# Patient Record
Sex: Female | Born: 1935 | Hispanic: No | Marital: Married | Smoking: Never smoker
Health system: Southern US, Community
[De-identification: ages and names within clinical notes are randomized; demographics above are authoritative.]

## PROBLEM LIST (undated history)

## (undated) DIAGNOSIS — L409 Psoriasis, unspecified: Secondary | ICD-10-CM

## (undated) DIAGNOSIS — I639 Cerebral infarction, unspecified: Secondary | ICD-10-CM

## (undated) DIAGNOSIS — H269 Unspecified cataract: Secondary | ICD-10-CM

## (undated) DIAGNOSIS — H9190 Unspecified hearing loss, unspecified ear: Secondary | ICD-10-CM

## (undated) DIAGNOSIS — Z7901 Long term (current) use of anticoagulants: Secondary | ICD-10-CM

## (undated) DIAGNOSIS — I4892 Unspecified atrial flutter: Secondary | ICD-10-CM

## (undated) DIAGNOSIS — Z87898 Personal history of other specified conditions: Secondary | ICD-10-CM

## (undated) DIAGNOSIS — I209 Angina pectoris, unspecified: Secondary | ICD-10-CM

## (undated) DIAGNOSIS — I519 Heart disease, unspecified: Secondary | ICD-10-CM

## (undated) DIAGNOSIS — M1990 Unspecified osteoarthritis, unspecified site: Secondary | ICD-10-CM

## (undated) DIAGNOSIS — E7800 Pure hypercholesterolemia, unspecified: Secondary | ICD-10-CM

## (undated) DIAGNOSIS — R51 Headache: Secondary | ICD-10-CM

## (undated) DIAGNOSIS — E119 Type 2 diabetes mellitus without complications: Secondary | ICD-10-CM

## (undated) DIAGNOSIS — R200 Anesthesia of skin: Secondary | ICD-10-CM

## (undated) DIAGNOSIS — R42 Dizziness and giddiness: Secondary | ICD-10-CM

## (undated) DIAGNOSIS — M810 Age-related osteoporosis without current pathological fracture: Secondary | ICD-10-CM

## (undated) DIAGNOSIS — R682 Dry mouth, unspecified: Secondary | ICD-10-CM

## (undated) DIAGNOSIS — I10 Essential (primary) hypertension: Secondary | ICD-10-CM

## (undated) DIAGNOSIS — K759 Inflammatory liver disease, unspecified: Secondary | ICD-10-CM

## (undated) DIAGNOSIS — B269 Mumps without complication: Secondary | ICD-10-CM

## (undated) DIAGNOSIS — B059 Measles without complication: Secondary | ICD-10-CM

## (undated) DIAGNOSIS — I2510 Atherosclerotic heart disease of native coronary artery without angina pectoris: Secondary | ICD-10-CM

## (undated) DIAGNOSIS — R202 Paresthesia of skin: Secondary | ICD-10-CM

## (undated) DIAGNOSIS — L4050 Arthropathic psoriasis, unspecified: Secondary | ICD-10-CM

## (undated) HISTORY — DX: Dizziness and giddiness: R42

## (undated) HISTORY — DX: Unspecified atrial flutter: I48.92

## (undated) HISTORY — DX: Essential (primary) hypertension: I10

## (undated) HISTORY — PX: APPENDECTOMY: SHX54

## (undated) HISTORY — PX: TONSILLECTOMY: SUR1361

## (undated) HISTORY — PX: CARDIAC CATHETERIZATION: SHX172

## (undated) HISTORY — DX: Pure hypercholesterolemia, unspecified: E78.00

## (undated) HISTORY — DX: Long term (current) use of anticoagulants: Z79.01

## (undated) HISTORY — DX: Heart disease, unspecified: I51.9

## (undated) HISTORY — DX: Atherosclerotic heart disease of native coronary artery without angina pectoris: I25.10

---

## 1998-12-08 ENCOUNTER — Other Ambulatory Visit: Admission: RE | Admit: 1998-12-08 | Discharge: 1998-12-08 | Payer: Self-pay | Admitting: Obstetrics and Gynecology

## 1999-10-03 ENCOUNTER — Other Ambulatory Visit: Admission: RE | Admit: 1999-10-03 | Discharge: 1999-10-03 | Payer: Self-pay | Admitting: Obstetrics and Gynecology

## 2000-06-18 ENCOUNTER — Encounter: Payer: Self-pay | Admitting: Geriatric Medicine

## 2000-06-18 ENCOUNTER — Ambulatory Visit (HOSPITAL_COMMUNITY): Admission: RE | Admit: 2000-06-18 | Discharge: 2000-06-18 | Payer: Self-pay | Admitting: Geriatric Medicine

## 2000-07-16 ENCOUNTER — Encounter: Payer: Self-pay | Admitting: Internal Medicine

## 2000-07-16 ENCOUNTER — Ambulatory Visit (HOSPITAL_COMMUNITY): Admission: RE | Admit: 2000-07-16 | Discharge: 2000-07-16 | Payer: Self-pay | Admitting: Internal Medicine

## 2000-10-29 ENCOUNTER — Other Ambulatory Visit: Admission: RE | Admit: 2000-10-29 | Discharge: 2000-10-29 | Payer: Self-pay | Admitting: Vascular Surgery

## 2000-10-30 ENCOUNTER — Other Ambulatory Visit: Admission: RE | Admit: 2000-10-30 | Discharge: 2000-10-30 | Payer: Self-pay | Admitting: Obstetrics and Gynecology

## 2000-10-30 ENCOUNTER — Encounter (INDEPENDENT_AMBULATORY_CARE_PROVIDER_SITE_OTHER): Payer: Self-pay

## 2001-10-27 ENCOUNTER — Other Ambulatory Visit: Admission: RE | Admit: 2001-10-27 | Discharge: 2001-10-27 | Payer: Self-pay | Admitting: Obstetrics and Gynecology

## 2002-01-08 ENCOUNTER — Ambulatory Visit (HOSPITAL_COMMUNITY): Admission: RE | Admit: 2002-01-08 | Discharge: 2002-01-08 | Payer: Self-pay | Admitting: Specialist

## 2002-02-01 ENCOUNTER — Ambulatory Visit (HOSPITAL_COMMUNITY): Admission: RE | Admit: 2002-02-01 | Discharge: 2002-02-01 | Payer: Self-pay | Admitting: Specialist

## 2002-11-15 ENCOUNTER — Other Ambulatory Visit: Admission: RE | Admit: 2002-11-15 | Discharge: 2002-11-15 | Payer: Self-pay | Admitting: Obstetrics and Gynecology

## 2003-11-15 ENCOUNTER — Other Ambulatory Visit: Admission: RE | Admit: 2003-11-15 | Discharge: 2003-11-15 | Payer: Self-pay | Admitting: Obstetrics and Gynecology

## 2004-06-27 ENCOUNTER — Encounter: Admission: RE | Admit: 2004-06-27 | Discharge: 2004-06-27 | Payer: Self-pay | Admitting: Neurosurgery

## 2004-09-29 HISTORY — PX: LUMBAR DISC SURGERY: SHX700

## 2004-10-05 ENCOUNTER — Inpatient Hospital Stay (HOSPITAL_COMMUNITY): Admission: RE | Admit: 2004-10-05 | Discharge: 2004-10-07 | Payer: Self-pay | Admitting: Neurosurgery

## 2005-02-14 ENCOUNTER — Inpatient Hospital Stay (HOSPITAL_COMMUNITY): Admission: EM | Admit: 2005-02-14 | Discharge: 2005-02-22 | Payer: Self-pay | Admitting: *Deleted

## 2005-02-16 HISTORY — PX: CORONARY ARTERY BYPASS GRAFT: SHX141

## 2005-03-25 ENCOUNTER — Encounter (HOSPITAL_COMMUNITY): Admission: RE | Admit: 2005-03-25 | Discharge: 2005-06-23 | Payer: Self-pay | Admitting: Interventional Cardiology

## 2005-06-25 ENCOUNTER — Encounter (HOSPITAL_COMMUNITY): Admission: RE | Admit: 2005-06-25 | Discharge: 2005-09-23 | Payer: Self-pay | Admitting: Interventional Cardiology

## 2005-09-29 ENCOUNTER — Encounter (HOSPITAL_COMMUNITY): Admission: RE | Admit: 2005-09-29 | Discharge: 2005-12-28 | Payer: Self-pay | Admitting: Interventional Cardiology

## 2005-12-31 ENCOUNTER — Encounter (HOSPITAL_COMMUNITY): Admission: RE | Admit: 2005-12-31 | Discharge: 2006-03-31 | Payer: Self-pay | Admitting: Interventional Cardiology

## 2006-01-21 ENCOUNTER — Encounter: Admission: RE | Admit: 2006-01-21 | Discharge: 2006-01-21 | Payer: Self-pay | Admitting: Obstetrics and Gynecology

## 2006-03-24 ENCOUNTER — Observation Stay (HOSPITAL_COMMUNITY): Admission: EM | Admit: 2006-03-24 | Discharge: 2006-03-25 | Payer: Self-pay | Admitting: Emergency Medicine

## 2006-04-01 ENCOUNTER — Encounter (HOSPITAL_COMMUNITY): Admission: RE | Admit: 2006-04-01 | Discharge: 2006-06-30 | Payer: Self-pay | Admitting: Interventional Cardiology

## 2006-04-21 ENCOUNTER — Encounter: Admission: RE | Admit: 2006-04-21 | Discharge: 2006-04-21 | Payer: Self-pay | Admitting: Rheumatology

## 2006-07-01 ENCOUNTER — Encounter (HOSPITAL_COMMUNITY): Admission: RE | Admit: 2006-07-01 | Discharge: 2006-09-29 | Payer: Self-pay | Admitting: Interventional Cardiology

## 2006-09-30 ENCOUNTER — Encounter (HOSPITAL_COMMUNITY): Admission: RE | Admit: 2006-09-30 | Discharge: 2006-12-29 | Payer: Self-pay | Admitting: Interventional Cardiology

## 2006-10-07 ENCOUNTER — Encounter: Admission: RE | Admit: 2006-10-07 | Discharge: 2006-10-07 | Payer: Self-pay | Admitting: Obstetrics and Gynecology

## 2006-12-30 ENCOUNTER — Encounter (HOSPITAL_COMMUNITY): Admission: RE | Admit: 2006-12-30 | Discharge: 2007-03-30 | Payer: Self-pay | Admitting: Interventional Cardiology

## 2007-03-31 ENCOUNTER — Encounter (HOSPITAL_COMMUNITY): Admission: RE | Admit: 2007-03-31 | Discharge: 2007-06-29 | Payer: Self-pay | Admitting: Interventional Cardiology

## 2007-06-30 ENCOUNTER — Encounter (HOSPITAL_COMMUNITY): Admission: RE | Admit: 2007-06-30 | Discharge: 2007-09-28 | Payer: Self-pay | Admitting: Interventional Cardiology

## 2007-07-08 ENCOUNTER — Encounter: Admission: RE | Admit: 2007-07-08 | Discharge: 2007-07-08 | Payer: Self-pay | Admitting: Rheumatology

## 2007-09-30 ENCOUNTER — Encounter (HOSPITAL_COMMUNITY): Admission: RE | Admit: 2007-09-30 | Discharge: 2007-12-29 | Payer: Self-pay | Admitting: Interventional Cardiology

## 2007-10-14 ENCOUNTER — Encounter: Admission: RE | Admit: 2007-10-14 | Discharge: 2007-10-14 | Payer: Self-pay | Admitting: Rheumatology

## 2007-12-31 ENCOUNTER — Encounter (HOSPITAL_COMMUNITY): Admission: RE | Admit: 2007-12-31 | Discharge: 2008-03-01 | Payer: Self-pay | Admitting: Interventional Cardiology

## 2008-10-18 ENCOUNTER — Encounter: Admission: RE | Admit: 2008-10-18 | Discharge: 2008-10-18 | Payer: Self-pay | Admitting: Rheumatology

## 2009-07-12 ENCOUNTER — Encounter: Admission: RE | Admit: 2009-07-12 | Discharge: 2009-07-12 | Payer: Self-pay | Admitting: Neurology

## 2009-10-19 ENCOUNTER — Encounter: Admission: RE | Admit: 2009-10-19 | Discharge: 2009-10-19 | Payer: Self-pay | Admitting: Obstetrics and Gynecology

## 2009-12-04 ENCOUNTER — Encounter: Admission: RE | Admit: 2009-12-04 | Discharge: 2009-12-04 | Payer: Self-pay | Admitting: Internal Medicine

## 2010-10-23 ENCOUNTER — Encounter: Admission: RE | Admit: 2010-10-23 | Discharge: 2010-10-23 | Payer: Self-pay | Admitting: Geriatric Medicine

## 2010-12-30 DIAGNOSIS — I639 Cerebral infarction, unspecified: Secondary | ICD-10-CM

## 2010-12-30 HISTORY — DX: Cerebral infarction, unspecified: I63.9

## 2011-02-04 ENCOUNTER — Inpatient Hospital Stay (HOSPITAL_COMMUNITY)
Admission: EM | Admit: 2011-02-04 | Discharge: 2011-02-08 | DRG: 065 | Disposition: A | Discharge status: Home Health | Payer: Medicare Other | Attending: Neurology | Admitting: Neurology

## 2011-02-04 ENCOUNTER — Emergency Department (HOSPITAL_COMMUNITY): Payer: Medicare Other

## 2011-02-04 DIAGNOSIS — Z79899 Other long term (current) drug therapy: Secondary | ICD-10-CM

## 2011-02-04 DIAGNOSIS — M4000 Postural kyphosis, site unspecified: Secondary | ICD-10-CM | POA: Diagnosis present

## 2011-02-04 DIAGNOSIS — E119 Type 2 diabetes mellitus without complications: Secondary | ICD-10-CM | POA: Diagnosis present

## 2011-02-04 DIAGNOSIS — I498 Other specified cardiac arrhythmias: Secondary | ICD-10-CM | POA: Diagnosis present

## 2011-02-04 DIAGNOSIS — I2510 Atherosclerotic heart disease of native coronary artery without angina pectoris: Secondary | ICD-10-CM | POA: Diagnosis present

## 2011-02-04 DIAGNOSIS — Q211 Atrial septal defect: Secondary | ICD-10-CM

## 2011-02-04 DIAGNOSIS — Z794 Long term (current) use of insulin: Secondary | ICD-10-CM

## 2011-02-04 DIAGNOSIS — I10 Essential (primary) hypertension: Secondary | ICD-10-CM | POA: Diagnosis present

## 2011-02-04 DIAGNOSIS — I6340 Cerebral infarction due to embolism of unspecified cerebral artery: Principal | ICD-10-CM | POA: Diagnosis present

## 2011-02-04 DIAGNOSIS — R29898 Other symptoms and signs involving the musculoskeletal system: Secondary | ICD-10-CM | POA: Diagnosis present

## 2011-02-04 DIAGNOSIS — Q2111 Secundum atrial septal defect: Secondary | ICD-10-CM

## 2011-02-04 DIAGNOSIS — Z8249 Family history of ischemic heart disease and other diseases of the circulatory system: Secondary | ICD-10-CM

## 2011-02-04 DIAGNOSIS — M1710 Unilateral primary osteoarthritis, unspecified knee: Secondary | ICD-10-CM | POA: Diagnosis present

## 2011-02-04 DIAGNOSIS — E785 Hyperlipidemia, unspecified: Secondary | ICD-10-CM | POA: Diagnosis present

## 2011-02-04 DIAGNOSIS — Z880 Allergy status to penicillin: Secondary | ICD-10-CM

## 2011-02-04 DIAGNOSIS — Z951 Presence of aortocoronary bypass graft: Secondary | ICD-10-CM

## 2011-02-04 DIAGNOSIS — R471 Dysarthria and anarthria: Secondary | ICD-10-CM | POA: Diagnosis present

## 2011-02-04 DIAGNOSIS — L4050 Arthropathic psoriasis, unspecified: Secondary | ICD-10-CM | POA: Diagnosis present

## 2011-02-04 DIAGNOSIS — I4892 Unspecified atrial flutter: Secondary | ICD-10-CM | POA: Diagnosis present

## 2011-02-04 DIAGNOSIS — Z833 Family history of diabetes mellitus: Secondary | ICD-10-CM

## 2011-02-04 LAB — TROPONIN I
Troponin I: 0.01 ng/mL (ref 0.00–0.06)
Troponin I: <0.01 ng/mL (ref 0.00–0.06)

## 2011-02-04 LAB — CBC
HCT: 39.3 % (ref 36.0–46.0)
Hemoglobin: 13.4 g/dL (ref 12.0–15.0)
Hemoglobin: 12.9 g/dL (ref 12.0–15.0)
MCHC: 34.4 g/dL (ref 30.0–36.0)
MCV: 86.8 fL (ref 78.0–100.0)
RBC: 4.32 MIL/uL (ref 3.87–5.11)
WBC: 8.2 10*3/uL (ref 4.0–10.5)

## 2011-02-04 LAB — COMPREHENSIVE METABOLIC PANEL
ALT: 27 U/L (ref 0–35)
Alkaline Phosphatase: 64 U/L (ref 39–117)
BUN: 20 mg/dL (ref 6–23)
Calcium: 9.6 mg/dL (ref 8.4–10.5)
Calcium: 9.3 mg/dL (ref 8.4–10.5)
Creatinine, Ser: 0.89 mg/dL (ref 0.4–1.2)
Creatinine, Ser: 0.77 mg/dL (ref 0.4–1.2)
GFR calc Af Amer: >60 mL/min (ref >60)
Glucose, Bld: 213 mg/dL — ABNORMAL HIGH (ref 70–99)
Glucose, Bld: 211 mg/dL — ABNORMAL HIGH (ref 70–99)
Potassium: 4.4 mEq/L (ref 3.5–5.1)
Sodium: 139 mEq/L (ref 135–145)
Total Protein: 6.6 g/dL (ref 6.0–8.3)
Total Protein: 6.5 g/dL (ref 6.0–8.3)

## 2011-02-04 LAB — DIFFERENTIAL
Basophils Absolute: 0 10*3/uL (ref 0.0–0.1)
Basophils Absolute: 0 10*3/uL (ref 0.0–0.1)
Basophils Relative: 0 % (ref 0–1)
Lymphocytes Relative: 44 % (ref 12–46)
Lymphs Abs: 3.6 10*3/uL (ref 0.7–4.0)
Neutro Abs: 3.4 10*3/uL (ref 1.7–7.7)
Neutro Abs: 4.8 10*3/uL (ref 1.7–7.7)
Neutrophils Relative %: 67 % (ref 43–77)

## 2011-02-04 LAB — CK TOTAL AND CKMB (NOT AT ARMC)
CK, MB: 1.8 ng/mL (ref 0.3–4.0)
CK, MB: 1.6 ng/mL (ref 0.3–4.0)
Relative Index: INVALID (ref 0.0–2.5)
Relative Index: INVALID (ref 0.0–2.5)
Total CK: 74 U/L (ref 7–177)

## 2011-02-04 LAB — PROTIME-INR
INR: 0.89 (ref 0.00–1.49)
INR: 0.92 (ref 0.00–1.49)
Prothrombin Time: 12.3 seconds (ref 11.6–15.2)
Prothrombin Time: 12.6 seconds (ref 11.6–15.2)

## 2011-02-04 LAB — APTT: aPTT: 24 seconds (ref 24–37)

## 2011-02-05 LAB — HEMOGLOBIN A1C
Hgb A1c MFr Bld: 7.8 % — ABNORMAL HIGH (ref <5.7)
Mean Plasma Glucose: 177 mg/dL — ABNORMAL HIGH (ref <117)

## 2011-02-05 LAB — GLUCOSE, CAPILLARY
Glucose-Capillary: 173 mg/dL — ABNORMAL HIGH (ref 70–99)
Glucose-Capillary: 183 mg/dL — ABNORMAL HIGH (ref 70–99)
Glucose-Capillary: 222 mg/dL — ABNORMAL HIGH (ref 70–99)

## 2011-02-05 LAB — URINALYSIS, ROUTINE W REFLEX MICROSCOPIC
Bilirubin Urine: NEGATIVE
Hgb urine dipstick: NEGATIVE
Ketones, ur: 15 mg/dL — AB
Nitrite: NEGATIVE
Urine Glucose, Fasting: 250 mg/dL — AB
pH: 5.5 (ref 5.0–8.0)

## 2011-02-05 LAB — MRSA PCR SCREENING: MRSA by PCR: NEGATIVE

## 2011-02-05 LAB — URINE MICROSCOPIC-ADD ON

## 2011-02-05 LAB — LIPID PANEL
Cholesterol: 117 mg/dL (ref 0–200)
LDL Cholesterol: 49 mg/dL (ref 0–99)
Triglycerides: 99 mg/dL (ref <150)

## 2011-02-06 ENCOUNTER — Inpatient Hospital Stay (HOSPITAL_COMMUNITY): Payer: Medicare Other

## 2011-02-06 LAB — GLUCOSE, CAPILLARY: Glucose-Capillary: 228 mg/dL — ABNORMAL HIGH (ref 70–99)

## 2011-02-06 MED ORDER — IOHEXOL 350 MG/ML SOLN
100.0000 mL | Freq: Once | INTRAVENOUS | Status: AC | PRN
  Administered 2011-02-06: 100 mL via INTRAVENOUS

## 2011-02-07 DIAGNOSIS — I6350 Cerebral infarction due to unspecified occlusion or stenosis of unspecified cerebral artery: Secondary | ICD-10-CM

## 2011-02-07 LAB — GLUCOSE, CAPILLARY
Glucose-Capillary: 163 mg/dL — ABNORMAL HIGH (ref 70–99)
Glucose-Capillary: 179 mg/dL — ABNORMAL HIGH (ref 70–99)
Glucose-Capillary: 117 mg/dL — ABNORMAL HIGH (ref 70–99)
Glucose-Capillary: 146 mg/dL — ABNORMAL HIGH (ref 70–99)

## 2011-02-08 LAB — GLUCOSE, CAPILLARY
Glucose-Capillary: 91 mg/dL (ref 70–99)
Glucose-Capillary: 168 mg/dL — ABNORMAL HIGH (ref 70–99)

## 2011-02-12 NOTE — Consult Note (Signed)
[name], [name]               ACCOUNT NO.:  [id number]  MEDICAL RECORD NO.:  [id number]           PATIENT TYPE:  I  LOCATION:  3018                         FACILITY:  [facility]  PHYSICIAN:  Armanda Magic, M.D.     DATE OF BIRTH:  01-19-1934  DATE OF CONSULTATION:  02/08/2011 DATE OF DISCHARGE:  02/08/2011                                CONSULTATION   REFERRING PHYSICIAN:  Pramod P. Pearlean Brownie, MD  CHIEF COMPLAINT:  Palpitations with arrhythmia.  HISTORY OF PRESENT ILLNESS:  This is a 77 year old white female with a history of CAD, hypertension, diabetes mellitus who presented to the emergency room with complaints of slurred speech and difficulty walking. She was diagnosed with a brain stem CVA and underwent 2D echocardiogram showing normal LV function.  She was about to be discharged home when she complained of palpitations.  Apparently, she has a history of some palpitations in the past.  Rhythm strip showed SVT at 160 beats per minute, which was felt to be atrial tachycardia versus atrial flutter with 2:1 block.  She has had some other arrhythmias during her hospital stay as well that have been somewhat more irregular and probably more consistent with atrial fibrillation.  We are now asked to consult.  PAST MEDICAL HISTORY:  CAD, status post CABG, hypertension, diabetes mellitus, psoriatic arthritis, dyslipidemia, history of hepatitis A.  ALLERGIES:  PENICILLIN.  FAMILY HISTORY:  Significant for CAD and diabetes mellitus.  SOCIAL HISTORY:  She lives with her husband at home.  She denies any tobacco or alcohol use.  No IV drug use.  MEDICATIONS ON ADMISSION: 1. Glucovance 2.5/500 mg 2 tablets t.i.d. 2. Ramipril 5 mg b.i.d. 3. Coreg 12.5 mg b.i.d. 4. Aspirin 325 mg a day. 5. Citracal plus vitamin D. 6. Multivitamin. 7. Magnesium oxide 200 mg daily. 8. Lipitor 80 mg at night. 9. Lantus 30 units subcu every night. 10.Enbrel 50 mg once a week.  REVIEW OF SYSTEMS:  Other  than what is stated in HPI is negative.  PHYSICAL EXAMINATION:  VITAL SIGNS:  Blood pressure is 143/76, heart rate 75. GENERAL:  This is a well-developed, well-nourished white female, in no acute distress. HEENT:  Benign. NECK:  Supple without lymphadenopathy.  No bruits. LUNGS:  Clear to auscultation throughout. HEART:  Regular rate and rhythm.  No murmurs, rubs, or gallops.  Normal S1 and S2. ABDOMEN:  Soft, nontender, nondistended with active bowel sounds.  No hepatosplenomegaly. EXTREMITIES:  No cyanosis, erythema, or edema.  LABORATORY DATA:  Sodium 139, potassium 4.5, chloride 104, bicarb 26, BUN 19, creatinine 0.11.  White cell count 7.1, hemoglobin 12.9, hematocrit 37.5, platelet count 157.  Cardiac enzymes negative x1.  CT of the neck showed no focal stenosis of the carotids.  2D echocardiogram showed normal LV function with no obvious source of embolus.  Her 12- lead EKG showed normal sinus rhythm with no ST changes.  There were several rhythm strips showing SVT with aberration.  A few of the runs appeared somewhat irregular, worrisome for atrial fibrillation.  She did not have several other episodes today, which were SVT about 160 beats per minute,  worrisome for possible atrial flutter.  Cannot rule out atrial flutter at this time based on the rhythm strips.  ASSESSMENT: 1. Acute brain stem cerebrovascular accident, still cardioembolic, but     no obvious source until the patient had the above-documented     arrhythmias. 2. Brief runs of supraventricular tachycardia especially with     palpitations also several episodes of wide complex tachycardia that     appear irregular and most likely her atrial fibrillation with     aberrancy.  The patient states she has had palpitations off and on.     Cannot rule out atrial flutter with 2:1 block as well as some     intermittent episodes of paroxysmal atrial fibrillation with     aberration.  This could represent a source of her  cerebrovascular     accident.  PLAN:  Given her [place] score of 4 and documented arrhythmias with possibility that this is atrial flutter and atrial fibrillation, I would recommend systemic anticoagulation.  We will increase Coreg to 25 mg p.o. b.i.d.  I have ordered an outpatient Life[employer] monitor through our office, and she will follow up with Dr. Katrinka Blazing on February 13, 2011.  She already has an appointment scheduled.     Armanda Magic, M.D.     TT/MEDQ  D:  02/08/2011  T:  02/09/2011  Job:  [id number]  cc:   Lyn Records, M.D.  Electronically Signed by Armanda Magic M.D. on 02/12/2011 12:08:51 PM

## 2011-02-28 NOTE — Discharge Summary (Signed)
NAMEKARMON, FOLK               ACCOUNT NO.:  [id number]  MEDICAL RECORD NO.:  [id number]           PATIENT TYPE:  I  LOCATION:  3018                         FACILITY:  [facility]  PHYSICIAN:  Pramod P. Pearlean Brownie, MD    DATE OF BIRTH:  1934/04/21  DATE OF ADMISSION:  02/04/2011 DATE OF DISCHARGE:  02/08/2011                              DISCHARGE SUMMARY   DIAGNOSES AT TIME OF DISCHARGE: 1. Left cerebellar infarct, felt to be embolic, though no     source confirmed. 2. Supraventricular tachycardia 3. Questionable atrial flutter. 4. Diabetes. 5. Hypertension. 6. Psoriatic arthritis. 7. Coronary artery disease status post coronary artery bypass graft. 8. Hyperlipidemia. 9. History of hepatitis A. 10. Patent foramen ovale.  MEDICINES AT TIME OF DISCHARGE: 1. Coreg 25 mg b.i.d. with meals. 2. Pradaxa 150 mg b.i.d. 3. Altace 5 mg b.i.d. 4. Citracal Plus D 1 a day. 5. Clonazepam 0.5 mg daily p.r.n. anxiety. 6. Enbrel 50 mg injection subcutaneous weekly on Thursdays. 7. Flexeril 10 mg daily p.r.n. for muscle relaxation. 8. Glucovance 2.5/500 2 tablets b.i.d. 9. Lantus 30 units nightly. 10.Lipitor 80 mg a day. 11.Multivitamin one a day. 12.Nitroglycerin sublingual 0.4 mg every 5 minutes, needs up to 3     doses for chest pain. 13.Vitamin D3 1000 units 2 tablets a day.  STUDIES PERFORMED: 1. CT of the brain on admission shows periventricular and corona     radiata white matter hypodensities compatible with chronic ischemic     microvascular white matter disease, otherwise, no acute     abnormality. 2. MRI of the brain shows a 1 x 2 cm acute infarct affecting the     cerebellum on the left.  Chronic small vessel changes elsewhere     throughout the brain. 3. MRA of the brain shows no major vessel occlusion or correctable     proximal stenosis.  Both superior cerebellar artery show flow.     Anterior-inferior cerebellar artery is identified only on the     right.  There is fetal  origin at the posterior cerebral artery on     the left from the anterior circulation.  It is unable to be     determined that there is occlusion at the left [employer], which is     anterior inferior cerebellar artery. 4. CT angio of the neck shows no focal stenosis.  Irregular beating of     cervical right ICA.  Tortuosity of internal carotid arteries     bilaterally and proximal left vertebral artery.  Atherosclerotic     calcifications at that aortic arch. 5. 2-D echocardiogram shows EF of 60% to 65% with no obvious source of     embolus. 6. Bilateral lower extremity Doppler, negative for DVT. 7. Carotid Doppler shows no ICA stenosis. 8. Transesophageal echocardiogram does show an atrial septal aneurysm     with a positive bubble study at rest.  There is severe     atherosclerosis of the descending aorta and aortic arch.  There are     no left atrial appendage thrombus.  EF was 60%. 9. EKG shows sinus rhythm  and rare complex possibly supraventricular     consider anterior infarct, borderline repolarization abnormality.  LABORATORY STUDIES:  Hemoglobin A1c 7.8.  Cholesterol 117, triglycerides 99, HDL 48, LDL 49.  Urinalysis, 0-2 red blood cells, 0-2 white blood cells, with rare bacteria.  MRSA screening negative.  Cardiac enzymes negative.  Chemistry with glucose 211, otherwise normal.  Liver function tests normal.  Coagulation studies normal.  CBC with diff normal.  HISTORY OF PRESENT ILLNESS:  Ms. [name] is a 77 year old right- handed Caucasian female with a history of hypertension, diabetes, coronary artery disease, who was in her usual state of health until 17:00 the day of admission.  She developed slurred speech and sensation of being pulled toward the left side and had difficulty walking.  She called her son, Dr. Mancel Bale from Oncology who came to see her.  He found her with severe slurred speech and gait difficulty.  She was brought to the emergency room for further  evaluation.  In the emergency room, she was found to have an NIH Stroke Scale of 2, one for decreased sensation in the left arm and one for dysarthria.  She was determined to not be a TPA candidate secondary to mild symptoms.  She was admitted to the Neuro ICU for further evaluation.  HOSPITAL COURSE:  MRI confirmed an acute left cerebellar infarct which appeared embolic in nature.  A complete workup was done including a TEE, which did show an atrial septal aneurysm and PFO, though this is not likely the etiology of her stroke.  On the day of discharge, the patient had a "flutter in her chest."  It was captured on telemetry.  Dr. Mayford Knife came and reviewed the strip.  She felt it was definitely SVT associated with palpitations and could not rule out atrial flutter with 2:1 block. Dr. Mayford Knife increased Coreg to 25 mg b.i.d.  Given her current [place] score and documented arrhythmia with possibility of a flutter, she recommended systemic anticoagulation.  Dr. Pearlean Brownie agreed with Pradaxa for anticoagulation with further arrhythmia workup to be followed up by Dr. Katrinka Blazing.  The patient will be sent with a cardiac monitor to wear at home to monitor heart rate looking for atrial fibrillation/atrial flutter.  The patient was evaluated by PT/OT and speech therapy.  She initially had ataxia but has improved significantly during hospitalization to the point where she can ambulate 200 feet with minimal guard assistance.  She will be discharged with a rolling walker with a seat for home use as well as home health PT and OT followup.  CONDITION AT TIME OF DISCHARGE:  The patient is alert and oriented x3. Speech clear.  No aphasia.  Visual fields full.  She was walking with a walker, though she has a slightly broad-based gait.  She has no focal extremity weakness.  DISCHARGE PLAN: 1. Discharge home with family. 2. Pradaxa for secondary stroke prevention given suspected atrial     flutter. 3. Followup Dr.  Katrinka Blazing in his office for further cardiac evaluation. 4. Outpatient cardiac monitor to be sent to the patient. 5. Followup Dr. Katrinka Blazing, February 15 at 9:45 a.m. 6. Followup Dr. Sandria Manly in 2 months. 7. Home Health PT and OT.     Annie Main, N.P.   ______________________________ Sunny Schlein. Pearlean Brownie, MD    SB/MEDQ  D:  02/08/2011  T:  02/09/2011  Job:  [id number]  cc:   Lyn Records, M.D. Genene Churn. Love, M.D. Demetria Pore. Coral Spikes, M.D. [facility]  Electronically  Signed by Annie Main N.P. on 02/18/2011 04:30:27 PM Electronically Signed by Delia Heady MD on 02/28/2011 01:17:42 PM

## 2011-03-04 NOTE — H&P (Signed)
[name], [name]               ACCOUNT NO.:  [id number]  MEDICAL RECORD NO.:  [id number]           PATIENT TYPE:  E  LOCATION:  [facility]                         FACILITY:  [facility]  PHYSICIAN:  Joycelyn Schmid, MD   DATE OF BIRTH:  1934-12-21  DATE OF ADMISSION:  02/04/2011 DATE OF DISCHARGE:                             HISTORY & PHYSICAL   CODE STROKE LOG:  Last seen normal 1700.  The patient arrival 36. Code Stroke was activated at 1825.  Initial ED physician examination [phone].  Stroke Team a lot arrival [phone].  Initial neurology exam 1837. NIH stroke scale 2.  Modified Rankin 0.  IV t-PA was not given due to rapidly improving since and mild symptoms.  HISTORY OF PRESENT ILLNESS:  A 77 year old female with hypertension, diabetes, coronary artery disease, psoriatic arthritis who was in normal state of health until 5 o'clock p.m. today.  She developed slurred speech and sensation of being pulled towards the left side.  She had extreme difficulty walking.  She called her son who is Dr. Mancel Bale from Oncology who came to see her.  He found her with severe slurred speech and gait difficulty.  He brought her to the emergency room for further evaluation.  PAST MEDICAL HISTORY: 1. Hypertension. 2. Diabetes. 3. Psoriatic arthritis. 4. Coronary artery disease status post CABG. 5. Hyperlipidemia. 6. History of hepatitis A.  MEDICATIONS: 1. Glucovance 2.5/500 two tablets twice a day. 2. Ramipril 5 mg twice a day. 3. Coreg 12.5 mg twice a day. 4. Aspirin 325 mg per day. 5. Citracal plus Vitamin D. 6. Multivitamin. 7. Magnesium oxide 200 mg daily. 8. Lipitor 80 mg at night. 9. Lantus 30 units subcu every night. 10.Enbrel 50 mg at once a week.  ALLERGIES:  PENICILLIN.  FAMILY HISTORY:  Diabetes, coronary artery disease.  SOCIAL HISTORY:  The patient lives at home with her husband.  Denies tobacco, alcohol, or illicit drug use.  REVIEW OF SYSTEMS:  As per the HPI.  No  recent chest pain, shortness of breath, abdominal pain.  She has psoriatic arthritis.  She did have nausea and vomiting with this episode of acute stroke.  PHYSICAL EXAMINATION:  VITAL SIGNS:  Blood pressure 113/80s, heart rate 70s to 80s, respirations 16, O2 sat 99% on 2 L nasal cannula. NEUROLOGIC:  She is awake and alert.  Language is fluent.  Comprehension is intact.  No aphasia.  She does have mild dysarthria.  Cranial nerve examination:  Pupils are reactive from 3-2 mm, mild end-gaze nystagmus on right gaze.  Visual fields are full to confrontation.  Facial sensation and strength is symmetric.  Uvula is midline.  Shoulders symmetric.  Tongue is midline.  Motor examination, normal bulk and tone, 5/5 strength in the right upper and bilateral lower extremity.  Left upper extremity has subtle decreased fine finger movements.  No pronator drift and slow finger-nose-finger movements.  Sensory examination intact to light touch.  Slightly decreased pinprick sensation in the left arm. Initial NIH stroke scale of 2, one point for decreased sensation on the left arm and one point for dysarthria.  LAB TESTING:  White  count 8.2, platelets 168, H and H 13.4 and  39.3. Sodium 139, potassium 4.4, BUN 20, creatinine 0.9.  LFTs normal.  CK-MB 1.8, CK 74, troponin 0.01.  PT 12.3, INR 0.9, PTT 24.  CT scan of the head which I reviewed shows mild chronic small vessel ischemic disease. No evidence intracerebral hemorrhage.  ASSESSMENT:  A 77 year old female with hypertension, diabetes, coronary artery disease, hyperlipidemia, now with suspected brainstem cerebral ischemic infarction.  We discussed the risks and benefits IV t-PA.  I also had the patient stand up and walk.  The patient states that her symptoms have significantly improved.  She states that her walking and balance is almost back normal at her baseline, which is somewhat limited by degenerative kyphosis and left knee  osteoarthritis.  PLAN: 1. No IV t-PA for now.  If she has significant neurologic worsening     within the 4-1/2 hour time window, we will give IV t-PA. 2. Admit to 3100 neuro ICU for close neurologic monitoring. 3. Stroke order set completed. 4. Transthoracic echocardiogram, carotid ultrasound, MRI, MRA of the     head, fasting lipid profile. 5. Maintain euvolemia, euglycemia,  normothermia. 6. We will continue antiplatelet therapy.  The patient was on aspirin     325 mg per day, but she is considering entry into the point trial.     Otherwise, we may switch her to Plavix. 7. Nausea control with Zofran. 8. Reviewed imaging myself. 9. Reviewed prior records.  Discussed case with the patient and her     son, other healthcare providers requiring high complex medical     decision making for acute ischemic infarction.     Joycelyn Schmid, MD     VP/MEDQ  D:  02/04/2011  T:  02/04/2011  Job:  [id number]  Electronically Signed by Joycelyn Schmid  on 03/04/2011 12:34:15 PM

## 2011-05-17 NOTE — Discharge Summary (Signed)
NAMELATANGELA, [name]               ACCOUNT NO.:  [id number]   MEDICAL RECORD NO.:  [id number]          PATIENT TYPE:  INP   LOCATION:  3007                         FACILITY:  [facility]   PHYSICIAN:  Coletta Memos, M.D.          DATE OF BIRTH:   DATE OF ADMISSION:  10/05/2004  DATE OF DISCHARGE:  10/07/2004                                 DISCHARGE SUMMARY   ADMITTING DIAGNOSES:  1.  Lumbar stenosis.  2.  Spondylosis.  3.  Degenerative disk disease.  4.  Herniated lumbar disk.  5.  Lumbar radiculopathy.  6.  Psoriatic arthritis, L2-3, L4-5, L5-S1.   PROCEDURES:  1.  Bilateral laminotomies, L2-3.  2.  Bilateral laminotomies, L4-5.  3.  Right L5-S1 laminotomy and microdissection.   COMPLICATIONS:  None.   DISCHARGE STATUS:  Alive and well.   DISCHARGE DESTINATION:  Home.   DISCHARGE MEDICATIONS:  1.  Flexeril 10 mg p.o. t.i.d. p.r.n. spasms.  2.  Percocet 5/325 1-2 q.6h p.r.n. pain.   INCISIONS:  Clean and dry without signs of infection at discharge.   INDICATIONS:  [name], a patient of Dr. Venetia Maxon, was admitted for lumbar  stenosis and intermittent claudication.  She underwent a decompressive  lumbar laminectomy for this.  She has done quite well postoperatively with  significant decrease in pain.  She is ambulating, voiding, and tolerating a  regular diet at discharge.  She was given instructions to call the office  for a return appointment.       KC/MEDQ  D:  10/07/2004  T:  10/07/2004  Job:  [id number]

## 2011-05-17 NOTE — Cardiovascular Report (Signed)
NAMECARRY, STEBELTON               ACCOUNT NO.:  [id number]   MEDICAL RECORD NO.:  [id number]          PATIENT TYPE:  INP   LOCATION:  2314                         FACILITY:  [facility]   PHYSICIAN:  Lyn Records III, M.D.DATE OF BIRTH:  1934/10/30   DATE OF PROCEDURE:  02/15/2005  DATE OF DISCHARGE:                              CARDIAC CATHETERIZATION   done in recent lab depending a cardiac catheterization report on [facility] the medical record number [id number] date q. 1706 capsule  Dr. Gilford Rile, she [facility] date of   PROCEDURE:  02/15/2005   INDICATIONS:  Recurrent prolonged chest pain and multiple cardiac risk  factors in a 77 year old female with an abnormal EKG suggesting prior  inferior and anteroseptal infarction.   PROCEDURE PERFORMED:  1.  Left heart catheterization.  2.  Selective coronary angiogram.  3.  Left ventriculography.  4.  AngioSeal arteriotomy closure.   DESCRIPTION OF PROCEDURE:  After informed consent, a 6-French sheath was  placed in the right femoral artery using a modified Seldinger technique. A 6-  [place] A2 multipurpose catheter was then used for hemodynamic recordings,  left ventriculography by hand injection, and flush injection of the right  coronary. We then used #4 left and right Judkins catheters for left and  right coronary angiography respectively. Both catheters were 6-French.  Postprocedure AngioSeal arteriotomy closure was performed.   HEMODYNAMIC DATA:  1.  Aortic pressure 162/77.  2.  Left ventricular pressure 161 over 9 mmHg.   LEFT VENTRICULOGRAPHY:  Left ventricle demonstrates normal size,  contractility and function. EF of 60%. No MR.   CORONARY ANGIOGRAPHY:  1.  Left main coronary:  There is a hazy distal 75-85% left main stenosis.  2.  The left anterior descending coronary: The LAD is a small to moderate-      sized vessel as LAD vessels go. There is a proximal 95% stenosis before      the  origin of the first diagonal and the septal perforator. The first      diagonal is moderate in size. The second diagonal is large. The LAD      reaches LV apex.  3.  The circumflex artery: The circumflex artery is huge. It is very      tortuous. It supplies the lateral apical region.  4.  The right coronary: The right coronary artery is a large dominant      vessel, tortuous but very large in caliber. PDA contains mid Luminal      irregularities. There is a large branching left ventricular system. No      significant obstruction is noted in the right coronary.  It has      midvessel plaquing with up to 20% narrowing.   CONCLUSION:  1.  Severe coronary atherosclerosis with severe distal left main to severe      proximal left anterior descending artery as described.  2.  Normal left ventricular function.   PLAN:  Urgent evaluation by CVTS with planning for coronary bypass grafting  in the  next 24-72 hours depending on the patient's clinical course.      HWS/MEDQ  D:  02/15/2005  T:  02/18/2005  Job:  [id number]   cc:   Demetria Pore. Coral Spikes, M.D.  [address]  [place]  [place] [address]  Fax: [phone]   Sheliah Plane, MD  [address]  [place]  [place] [address]

## 2011-05-17 NOTE — Discharge Summary (Signed)
[name], [name]               ACCOUNT NO.:  [id number]   MEDICAL RECORD NO.:  [id number]          PATIENT TYPE:  INP   LOCATION:  2028                         FACILITY:  [facility]   PHYSICIAN:  Lyn Records, M.D.   DATE OF BIRTH:  February 09, 1934   DATE OF ADMISSION:  03/24/2006  DATE OF DISCHARGE:  03/25/2006                                 DISCHARGE SUMMARY   DISCHARGE DIAGNOSES:  1.  Chest pain, resolved.  2.  Known coronary artery disease.  3.  History of coronary artery bypass grafting in 2006.  4.  Hypertension, treated.  5.  Diabetes mellitus, treated.  6.  Hyperlipidemia, treated.  7.  Psoriatic arthritis.  8.  History of gastrointestinal bleed 2002.  9.  Osteopenia.  10. Decreased hearing with bilateral hearing aides.  11. History of hepatitis A in 1994.  12. Allergy to penicillin.  13. Multiple medication uses.  14. Family history of heart disease.   HOSPITAL COURSE:  [name] is a 77 year old female with a known history  of coronary artery bypass grafting in 2006.  She was admitted on March 24, 2006, with a one week history of substernal chest pain.  The pain worsened  and was accompanied by bilateral jaw pain.  She presented to [facility]  Emergency Room.  She denied any shortness of breath, nausea, vomiting,  diaphoresis, dizziness or syncope.  Ultimately, she went to the cardiac  catheterization laboratory under the care of Dr. Verdis Prime.  She was found  to have patent graft (saphenous vein graft to the diagonal-1 and OM-1, LIMA  to the LAD).  A second diagonal was felt to be possibly threatened due to  competitive flow.  The negative LAD had a 95% proximal lesion and the first  diagonal and distal LAD had competitive flow.  The left main had a 50%  lesion.  The native circumflex was occluded and the patient was found to  have a right coronary dominant system.  Dr. Katrinka Blazing felt that the patient  should undergo medical treatment at this time.  However if the  patient's  symptoms continued or if the second diagonal territory was ischemic then he  would consider ascent to left main and proximal LAD.  At this point, the  patient has not tolerated the procedure well and has been ambulating without  difficulty.  She is discharged to home in stable but improved condition.   LABORATORY DATA:  Hemoglobin 12.9, hematocrit 37.3, platelets 181, white  count 7.7.  Sodium 141, potassium 3.7, BUN 16, creatinine 0.6.  Cardiac  enzymes are negative x3.  Chest x-ray was not acute.  The patient's EKG  showed normal sinus rhythm, with rare PVCs, not acute.   DISCHARGE INSTRUCTIONS:  The patient is being discharged to home with  following instructions:  low-fat, diabetic diet.  No driving for two days.  No lifting over 10 pounds for one week.  Clean and rinse site gently with  soap and water.  She is to follow up with Dr. Katrinka Blazing on April 14, 2006, at  1:45 p.m.  She is  to call for any recurrent chest pain, groin pain or groin  swelling at the calf site.  She may resume her medications with the  exception of Glucovance which is to be restarted on March 28, 2006.  Otherwise, she may resume her current home medications now.   DISCHARGE MEDICATIONS:  1.  Enteric-coated aspirin 325 mg a day.  2.  Altace 5 mg p.o. b.i.d.  3.  Coreg 3.125 mg p.o. b.i.d.  4.  Lipitor 80 mg nightly.  5.  Lantus nightly.  6.  Enbrel weekly.  7.  Multivitamin daily.  8.  Sublingual nitroglycerin p.r.n. chest pain.      Guy Franco, P.A.      Lyn Records, M.D.  Electronically Signed    LB/MEDQ  D:  03/25/2006  T:  03/27/2006  Job:  [id number]   cc:   Lyn Records, M.D.  Fax: [phone]   [name]. [name], M.D.  Fax: [phone]

## 2011-05-17 NOTE — H&P (Signed)
NAMEPHEBE, [name]               ACCOUNT NO.:  [id number]   MEDICAL RECORD NO.:  [id number]          PATIENT TYPE:  INP   LOCATION:  2028                         FACILITY:  [facility]   PHYSICIAN:  Lyn Records, M.D.   DATE OF BIRTH:  Mar 29, 1934   DATE OF ADMISSION:  03/24/2006  DATE OF DISCHARGE:                                HISTORY & PHYSICAL   CHIEF COMPLAINT:  Chest pain.   HISTORY OF PRESENT ILLNESS:  [name] is 77 year old Caucasian female  with a history of coronary artery disease status post cardiac  catheterization, status post CABG in February of 2006, who complained of  crampy pain across her chest 1 week ago while at rest.  [place].  She took 10 mg of Flexeril and 2 Rolaids for  chest pain relief.  The pain lasted 15-20 minutes.  The pain was relieved;  however, the patient is unsure if the medications were the cause of the pain  relief.  She experienced an episodic recurrence the next morning upon  bending over, with the chest pain lasting for seconds, and being resolved  upon standing.  She denied shortness of breath, nausea, vomiting,  diaphoresis, dizziness, and syncope with her episodic chest pain.  This  morning, [name] complained of bilateral jaw pain with exercise while  in cardiac rehabilitation.  The jaw pain was described as a dull ache, and  was rated a 5/10.  The jaw pain lasted 10 minutes and was relieved with rest  and oxygen at 4 liters per minute.  She was brought to the [facility]  emergency department by wheelchair.  Her EKG upon arrival revealed normal  sinus rhythm with a heart rate in the 80s x2.  Her point of care enzymes and  serial cardiac enzymes were negative x1.  Since her episodic chest pain a  week ago, she has complained of fatigue.  The patient does not have a 2-D  echocardiogram on file.   PAST MEDICAL HISTORY:  1.  Coronary artery disease status post cardiac catheterization.  2.   Status post CABG in February 2006.  3.  Hypertension.  4.  Diabetes mellitus.  5.  Psoriatic arthritis.  6.  Hyperlipidemia.  7.  History of GI bleed in 2002.  8.  Osteopenia.  9.  Decreased hearing with bilateral hearing aids.  10. Status post hepatitis A with hospitalization in 1994.  11. History of vertigo in 1994.  12. History of elevated GGT.  13. Status post hidradenitis, left arm, in 1998.  14. Low back pain.   ALLERGIES:  1.  PENICILLIN.  2.  CHLOROMYCETIN.   MEDICATIONS:  1.  Enteric-coated aspirin 325 mg daily.  2.  Lipitor 80 mg at bedtime.  3.  Glucovance 2.5/500 mg twice daily.  4.  Lantus 25 units every p.m.  5.  Altace 5 mg twice daily.  6.  Tylenol extra strength 650 mg daily (OTC).  7.  Enbrel 50 mg/cc weekly.  8.  Multivitamin daily.  9.  Coreg 3.25 mg twice daily.  PAST SURGICAL HISTORY:  1.  Status post lumbar spinal stenosis surgery.  2.  Status post laser treatment to both eyes to prevent glaucoma.  3.  Status post appendectomy.  4.  Status post tonsillectomy.  5.  Status post endometrial biopsies; no cancer.  6.  Status post left axillary abscess secondary to folliculitis; I&D in      1997.  7.  Status post mole, right cheek, benign.   FAMILY HISTORY:  Positive for heart disease greater than age 72 and diabetes  mellitus.   SOCIAL HISTORY:  Married with 3 adult children.  She is retired from the  [employer].  She denies tobacco, alcohol or illicit drug use.   REVIEW OF SYSTEMS:  All systems reviewed and are negative, other than what  is stated in the HPI.   PHYSICAL EXAMINATION:  GENERAL:  A 77 year old female, pleasant and  cooperative, in no acute distress.  VITAL SIGNS:  Temperature 97.7, pulse 80, respirations 20, blood pressure  157/85, O2 saturations 100% on 2 liters.  HEENT:  Unremarkable.  NECK:  Supple without JVD or carotid bruits bilaterally.  LUNGS:  Breath sounds are equal and clear to auscultation bilaterally   without wheezes, rhonchi, crackles or rales.  HEART:  Regular rate and rhythm.  S1 and S2 are normal without murmurs,  gallops, clicks or rubs.  ABDOMEN:  Soft, nontender, nondistended, with active bowel sounds.  No  aortic or iliac bruits.  No hepatomegaly.  EXTREMITIES:  No peripheral edema.  DP pulses 2+/2.  SKIN:  Warm and dry without rashes or lesions.  NEUROLOGIC:  Alert and oriented x3.  No focal deficits.  PSYCHIATRIC:  Normal mood and affect.   LABORATORY DATA:  Sodium 140, potassium 4.6, chloride 108, CO2 of 28, BUN  16, creatinine 0.8, glucose 114.  Point of care enzymes x1 revealed  myoglobin of 61.5.  CK-MB of 1.0.  Troponin I less than 0.05.  Serial  cardiac enzymes x1.  CK total 57, CK-MB 1.6, troponin I 0.01.   CHEST X-RAY:  Low inspiratory volume.  No cardiopulmonary disease.   ASSESSMENT:  1.  Chest pain.  2.  Fatigue.  3.  Hypoglycemia.  4.  Hypotension.  5.  Hyperlipidemia.   PLAN:  1.  Rule out unstable angina.  First set of point of care and serial enzymes      are negative.  Cardiac recatheterization scheduled for a.m.  2.  Admit to cardiac telemetry unit under Dr. Corky Sing service with a      diagnosis of chest pain.  3.  Continue to cycle serial cardiac enzymes including CK total, CK-MB, and      troponin I.  4.  Check EKG, FLP, BMET and CBC in a.m.  5.  Subcutaneous Lovenox 1 mg/kg.  6.  Check PT and PTT.  7.  Hold Glucovance 2.5/500 twice daily.  8.  The patient was seen and examined by Dr. Katrinka Blazing who participated in the      medical decision making and plan of care.      [name], [place]      Lyn Records, M.D.  Electronically Signed    RDM/MEDQ  D:  03/24/2006  T:  03/25/2006  Job:  [id number]

## 2011-05-17 NOTE — Cardiovascular Report (Signed)
[name], [name]               ACCOUNT NO.:  [id number]   MEDICAL RECORD NO.:  [id number]          PATIENT TYPE:  INP   LOCATION:  2028                         FACILITY:  [facility]   PHYSICIAN:  Lyn Records, M.D.   DATE OF BIRTH:  [birth year]   DATE OF PROCEDURE:  03/25/2006  DATE OF DISCHARGE:  03/25/2006                              CARDIAC CATHETERIZATION   PROCEDURE:  Exertional jaw discomfort, substernal chest tightness at rest.  The patient is 1 year status post bypass.  This study is being done to  documented graft patency.  She had a sequential saphenous vein graft placed  to a diagonal and obtuse marginal and LIMA to the LAD in February 2006.   PROCEDURE PERFORMED:  1.  Left heart catheterization.  2.  Selective coronary angiography.  3.  Left ventriculography.  4.  Saphenous vein graft angiography.  5.  Internal mammary graft angiography.   DESCRIPTION:  After informed consent, a 6-French sheath was placed in the  right femoral artery.  The A2 multipurpose catheter was used for hemodynamic  recordings, left ventriculography by hand injection, and selective saphenous  vein graft angiography.  A left Judkins catheter was used for left coronary  angiography, and the internal mammary catheter was used for right coronary  angiography and left internal mammary artery angiography.  The patient  tolerated the procedure without complications.   RESULTS:  1.  Hemodynamic data.      1.  Aortic pressure 132/67.      2.  Left ventricular pressure 132/6.  2.  Left ventriculography:  Left ventricular cavity size and function are      normal.  EF is greater than 60%.  No mitral regurgitation is noted.  3.  Coronary angiography.      1.  Left main coronary artery contains a distal 60% to 70% narrowing.          This is unchanged from the appearance prior to coronary bypass.      2.  Left anterior descending coronary:  There is a concentric, greater          than 90% stenosis  before the origin of the first two diagonals.          Competitive flow is noted in the first diagonal and in the LAD from          the previously placed grafts.  The second diagonal is a large vessel          that could be potentially underperfused, although there are at least          three sources of flow at this time which include retrograde flow          through the first diagonal via the saphenous vein graft and          retrograde flow via the internal mammary graft to the LAD and also          antegrade flow through the left main and proximal LAD lesion.      3.  Circumflex artery:  The large first obtuse marginal  was totally          occluded.      4.  Right coronary:  The right coronary is a large dominant vessel, 2          left ventricular branches, large PDA that reaches the left          ventricular apex.  No significant obstructive lesions are noted.  4.  Bypass graft angiography.      1.  Saphenous vein sequential graft to the first diagonal and the obtuse          marginal:  Widely patent with competitive flow into the proximal          LAD.  No retrograde filling of the left main is noted.      2.  LIMA to the LAD:  This graft is widely patent.  Reflux is noted into          the large second diagonal.  There is diffuse 50% narrowing between          the graft insertion site and the origin of the second diagonal.   CONCLUSIONS:  1.  Widely patent saphenous vein graft to the first diagonal and obtuse      marginal.  2.  Widely patent LIMA to the LAD.  3.  Native vessel coronary disease with 60%-70% distal left main, 95%      stenosis in the proximal LAD, total occlusion of the proximal      circumflex.  There is diffuse disease between the LIMA insertion site      into the mid LAD and the first diagonal.  The first diagonal could at      some point potentially become ischemic if antegrade flow via the native      circulation becomes occluded and disease in the segment  between the LIMA      graft and the diagonal origin becomes more severe.  4.  Normal LV function.   PLAN:  No intervention or change in therapy is indicated.  I believe that  different types of discomfort that the patient had are probably not cardiac  in origin.  Certainly, as time goes along in the future, we will monitor for  evidence of anterolateral ischemia which may represent threatened flow in  the second diagonal which is a relatively large vessel.  The remedy for that  would be left main and proximal LAD stenting.      Lyn Records, M.D.  Electronically Signed     HWS/MEDQ  D:  03/25/2006  T:  03/26/2006  Job:  [id number]   cc:   Sheliah Plane, MD  [address]  [place]  [place] [address]

## 2011-05-17 NOTE — Op Note (Signed)
[name], [name]               ACCOUNT NO.:  [id number]   MEDICAL RECORD NO.:  [id number]          PATIENT TYPE:  INP   LOCATION:  2314                         FACILITY:  [facility]   PHYSICIAN:  Sheliah Plane, MD    DATE OF BIRTH:  11-Aug-1934   DATE OF PROCEDURE:  02/16/2005  DATE OF DISCHARGE:                                 OPERATIVE REPORT   PREOPERATIVE DIAGNOSES:  Coronary occlusive disease with left main  obstruction.   POSTOPERATIVE DIAGNOSES:  Coronary occlusive disease with left main  obstruction.   OPERATION PERFORMED:  Coronary artery bypass grafting times three with the  left internal mammary artery to the left anterior descending coronary  artery, sequential reversed saphenous vein graft to the highest diagonal and  circumflex coronary artery with Endo vein harvesting, right thigh.   SURGEON:  Gwenith Daily. Tyrone Sage, M.D.   ASSISTANT:  Salvatore Decent. Dorris Fetch, M.D.   SECOND ASSISTANT:  Jerold Coombe, P.A.   ANESTHESIA:  General.   INDICATIONS FOR PROCEDURE:  The patient is a 77 year old female who several  days prior to surgery noted onset of heaviness in her arms, primarily with  exertion but began having symptoms at rest.  She is a known diabetic.  She  underwent cardiac catheterization by Lyn Records, M.D. and was found to  have a distal left main obstruction of 70 to 80% with hazy appearance to the  lesion and a 90 to 95% obstruction of the proximal LAD which involved the  highest diagonal.  The right coronary artery was free of disease.  Because  of the critical nature of the disease, coronary artery bypass grafting was  recommended and the patient agreed and signed informed consent.   DESCRIPTION OF PROCEDURE:  With Swann-Ganz and arterial line monitors in  place, the patient underwent general endotracheal anesthesia without  incident.  The skin of the chest and legs was prepped with Betadine and  draped in the usual sterile manner.  Using the [employer]  Endo vein harvesting  system, vein was harvested from the right thigh after initially looking in  the left thigh and finding no suitable vein.  The vein harvested from the  right thigh was of adequate quality and caliber.  A median sternotomy was  performed.  The left internal mammary artery was dissected down as a pedicle  graft.  The distal artery was divided and had good free flow.  The  pericardium was opened.  Overall ventricular function appeared preserved.  The patient was systemically heparinized.  The ascending aorta and the right  atrium were cannulated in the aortic root.  A bent cardioplegia needle was  introduced into the ascending aorta.  The patient was placed on  cardiopulmonary bypass at 2.4L per minute per meter squared.  Sites for  anastomosis were selected and dissected out of the epicardium.  The  patient's body temperature was cooled to 30 degrees.  An aortic crossclamp  was applied.  500 cc of cold blood potassium cardioplegia was administered  with rapid diastolic arrest of the heart.  Myocardial septal temperature was  monitored throughout  the crossclamp period.   Attention was turned first to the highest diagonal coronary artery which was  opened and admitted a 1.5 mm probe.  Using a diamond type side-to-side  anastomosis was carried out.  Distal extent of the same vein was carried a  short distance to the circumflex coronary artery which was approximately the  same size as the diagonal.  Using a running 7-0 Prolene, distal anastomosis  was performed. Attention was then turned to the left anterior descending  coronary artery, which was opened in the midportion of the distal two  thirds.  The distal third of the vessel was intramyocardial.  Using running  8-0 Prolene, the left internal mammary artery was anastomosed to the left  anterior descending coronary artery.  With release of the Edwards bulldog on  the mammary artery, there was prompt rise in myocardial  septal temperature.  Bulldog was replaced on the mammary artery and the proximal anastomosis was  performed with cross-clamp still in place.  A single punch aortotomy was  performed and the vein graft was anastomosed to the ascending aorta with a  running 6-0 Prolene suture.  Air was evacuated from the heart and grafts and  the aortic cross-clamp was removed with a total cross-clamp time of 50  minutes.  The patient spontaneously converted to a sinus rhythm.  she was  rewarmed to 37 degrees.  Atrial and ventricular pacing wires were applied.  Graft markers were applied.  The patient was then ventilated and weaned from  cardiopulmonary bypass without difficulty and remained hemodynamically  stable and was decannulated in the usual fashion.  Protamine sulfate was  administered.  With the operative field hemostatic, two atrial and two  ventricular pacing wires were applied.  Graft markers applied.  A left  pleural tube and two mediastinal tubes were left in place.  Sternum was  closed with #6 stainless steel wire.  Fascia closed with interrupted 0  Vicryl, running 3-0 Vicryl in the subcutaneous tissues and 4-0 subcuticular  stitch in the skin edges.  Dry dressings were applied.  Sponge and needle  counts were reported as correct at the completion of the procedure.  The  patient tolerated the procedure without obvious complication and was  transferred to the surgical intensive care unit for further postoperative  care.  The patient did require two units of packed red blood cells because  of low hematocrit.      EG/MEDQ  D:  02/18/2005  T:  02/18/2005  Job:  [id number]   cc:   Lyn Records III, M.D.  [address]  [address]  [place]  [place] [address]  Fax: [phone]

## 2011-05-17 NOTE — Op Note (Signed)
NAMEGINNI, IXTA               ACCOUNT NO.:  [id number]   MEDICAL RECORD NO.:  [id number]          PATIENT TYPE:  INP   LOCATION:  2869                         FACILITY:  [facility]   PHYSICIAN:  Danae Orleans. Venetia Maxon, M.D.  DATE OF BIRTH:  Oct 02, 1934   DATE OF PROCEDURE:  10/05/2004  DATE OF DISCHARGE:                                 OPERATIVE REPORT   PREOPERATIVE DIAGNOSES:  Spinal stenosis, spondylosis, degenerative disk  disease, herniated lumbar disk, lumbar radiculopathy, with psoriatic  arthritis, L2-3, L4-5, and L5-S1 levels.   POSTOPERATIVE DIAGNOSES:  Spinal stenosis, spondylosis, degenerative disk  disease, herniated lumbar disk, lumbar radiculopathy, with psoriatic  arthritis, L2-3, L4-5, and L5-S1 levels.   PROCEDURES:  1.  Bilateral laminotomies of L2-3.  2.  Bilateral laminotomies of L4-5.  3.  Right L5-S1 laminotomy.  4.  Microdissection.   SURGEON:  Danae Orleans. Venetia Maxon, M.D.   ASSISTANT:  Coletta Memos, M.D.   ANESTHESIA:  General endotracheal anesthesia.   ESTIMATED BLOOD LOSS:  50 mL.   COMPLICATIONS:  None.   DISPOSITION:  To recovery.   INDICATIONS:  Ms. Midura is a 77 year old woman with severe low back and  bilateral lower extremity pain with history of psoriatic arthritis.  It was  elected to take her to surgery for decompression at the affected levels.  Plan was for her to undergo bilateral L2-3 laminotomies and also bilateral  L4-5 laminotomies and right L5-S1 microdiskectomy.   PROCEDURE:  Ms. Wiesenfeld was brought to the operating room.  Following the  satisfactory and uncomplicated induction of general endotracheal anesthesia  and placement of intravenous lines, the patient was placed in a prone  position on the Wilson frame, where her low back was then prepped and draped  in the usual sterile fashion.  The area of planned incision was infiltrated  with 0.25% Marcaine and 0.5% lidocaine and 1:200,000 epinephrine.  Incision  was made in the midline  from the L2 to the sacral levels and carried through  subcutaneous tissues to the lumbar dorsal fascia, which was incised  bilaterally at L2-3 and L4-5 levels and on the right at L5-S1 level.  The  lamina of L5 was on the right and L4-5 bilaterally and L2-3 bilaterally and  marker probes were placed after Versatrac retractor was placed to facilitate  exposure.  Intraoperative x-ray confirmed correct orientation with marker  probes at the L5-S1, L4-5, and L2-3 levels.  Subsequently under loupe  magnification using high-speed drill, hemisemilaminectomies of L2  bilaterally, L4 bilaterally, and L5 on the right were performed.  The  ligamentous tissue was highly degenerated and hypertrophied.  Initially at  the L5-S1 level, a laminoforaminotomy was performed and with decompression  of the thecal sac and lateral recess as well as overlying the S1 nerve root.  Subsequently using microdissection technique, the S1 nerve root was  mobilized with the assistant's assistance under the operating microscope and  after inspecting the disk space, which there appeared to be a very  spondylitic ridge rather than any soft disk herniation, and it was felt that  the S1 nerve root was actually  exiting the spinal canal without a great deal  of encumbrance and because of the patient's psoriatic arthritis, it would be  of limited decompressive benefits to perform a formal diskectomy at this  level with the higher potential for her to develop subsequent instability.  It was therefore elected not to perform microdiskectomy at this level.  The  exposure of the L2-3 and L4-5 levels was then performed and at each level,  the ligamentous tissues were hypertrophied and the superior aspect of the L5  facet was highly hypertrophied and calcified and adherent to the ligament.  Removal of this resulted in significant decompression of the thecal sac and  of the L5 nerve roots as they extended up the neural foramina.  It is  felt  that with this level of decompression, there was significant increased space  for the L5 nerve roots.  A similar decompression was performed at the L2-3  level and, again at this level, there was markedly calcified, hypertrophied  ligamentous tissue, which was decompressed and removed and generous  laminoforaminotomies were performed with decompression fo the thecal sac and  L3 nerve roots.  These were performed with decompression of the thecal sac  and L3 nerve roots.  The lateral recesses were also decompressed.  Care was  taken not to undermine the facet joints at this level.  The wound was then  copiously irrigated with bacitracin saline, the self-retaining retractor was  removed.  The lumbar dorsal fascia was closed with 0 Vicryl sutures.  The  subcutaneous tissue was reapproximated with 2-0 Vicryl interrupted, inverted  sutures, and the skin is reapproximated with interrupted 3-0 Vicryl  subcuticular stitch.  The wound was dressed with Dermabond.  The patient was  extubated in the operating room and taken to the recovery room in stable and  satisfactory condition, having tolerated her operation well.  Counts were  correct at the end of the case.      Jose   JDS/MEDQ  D:  10/05/2004  T:  10/05/2004  Job:  [id number]

## 2011-05-17 NOTE — Discharge Summary (Signed)
[name], [name]               ACCOUNT NO.:  [id number]   MEDICAL RECORD NO.:  [id number]          PATIENT TYPE:  INP   LOCATION:  2017                         FACILITY:  [facility]   PHYSICIAN:  Sheliah Plane, MD    DATE OF BIRTH:  [birth year]   DATE OF ADMISSION:  02/14/2005  DATE OF DISCHARGE:  02/22/2005                                 DISCHARGE SUMMARY   ADMISSION DIAGNOSIS:  Chest heaviness with bilateral arm fatigue.   PAST MEDICAL HISTORY AND DISCHARGE DIAGNOSES:  1.  Hypertension.  2.  Hyperlipidemia.  3.  Insulin-dependent diabetes mellitus.  4.  History of spinal stenosis.  5.  Osteopenia.  6.  Migraines.  7.  History of hepatitis A.  8.  Coronary artery disease coronary artery bypass grafting x3.   ALLERGIES:  1.  PENICILLIN.  2.  CHLOROMYCETIN.   BRIEF HISTORY:  The patient is a 77 year old Caucasian female who presented  to her primary care physicians office on the morning on February 14, 2005  with a complaint of chest heaviness and bilateral arm discomfort with  fatigue that has been present for approximately two weeks.  She noted that  this has continued to increase, and she had awakened that morning with the  heaviness present in the left anterior chest.  She denied chest pain,  shortness of breath, nausea, diaphoresis, and any other radiation.  The  patient was given sublingual nitroglycerin at her primary care physicians  office with relief of the chest pain.  The patient has a history of a normal  adenosine Cardiolite in 2001, with normal wall motion and ejection fraction  75%.  Secondary to this information, the patient was admitted by her primary  care physician to rule out myocardial infarction.   HOSPITAL COURSE:  The patient was admitted by Dr. Verdis [name] on February 14, 2005 to rule out myocardial infarction.  She had presented to her  primary care physicians office with complaints of anterior chest heaviness  and bilateral arm discomfort and  fatigue, as previously stated.  On  admission her EKG revealed a normal sinus rhythm with Q's in the inferior  leads at a rate of 79.  Q waves had been present however since 2000.  The  patient was admitted and maintained on routine hospital care until she could  be scheduled for cardiac catheterization by Dr. Katrinka [name].   The patient was taken to Cardiac Cath on February 15, 2005.  This revealed  left main and LAD disease.  Dr. Tyrone [name] of the CVTS service was  subsequently consulted regarding surgical revascularization.  After  evaluation of the patient, it was Dr. Dennie [name] opinion that the patient  should proceed with coronary artery bypass grafting.   The patient was taken to the OR on February 16, 2005 for coronary artery  bypass grafting x3.  Left internal mammary artery was grafted to the LAD,  saphenous vein was grafted to the diagonal and to the circumflex in  sequence.  Endoscopic vessel harvesting was performed on the right thigh.  The patient tolerated the procedure well and hemodynamically stable  immediately postoperatively.  The patient was transferred from the OR to the  SICU in stable condition.  The patient was extubated without complication,  woke up from anesthesia neurologically intact.   The patients postoperative course progressed as expected.  On postoperative  day one she was afebrile with stable vital signs and maintaining a normal  sinus rhythm.  The patient was volume overloaded postoperatively and was  diuresed accordingly.  She was in stable condition and therefore chest tubes  and invasive lines were discontinued in a routine manner.  All drugs were  weaned accordingly and she was then transferred from the SICU to Unit 2000  without difficulty.   The patient began cardiac rehab on postoperative day one and increased her  tolerance to a satisfactory level prior to discharge.  On postoperative day  three the patient was complaining of constipation.  She was  given several  different laxatives as well as a Fleets enema before a result was obtained.  However, normal bowel function did return prior to discharge.  On the  evening of postoperative day five she was noted to have a run of SVT with a  rate of 150-200.  This lasted for three seconds.  She was completely  asymptomatic, returned to a normal sinus rhythm, and maintained that for the  rest of her hospital course.   On postoperative day six, the patients only complaint was of minimal nausea.  She was afebrile with stable vital signs and maintaining a normal sinus  rhythm.  On physical exam -- cardiac was regular rate and rhythm.  Lungs  were clear to auscultation with decreased breath sounds in bilateral bases.  Abdomen was benign.  The incisions were healing well and there was trace  edema present in the bilateral lower extremities.  The patients diabetes  mellitus had been maintained throughout the postoperative course on sliding-  scale insulin, as well as her p.o. home medications.  The patient was  discharged on her p.o. home medications with instructions to follow-up  closely with her primary care physician regarding further adjustments.  She  was __________and felt to be stable for discharge home.   LABS:  BMP on February 22, 2005 -- sodium 139, potassium 3.9, BUN 12,  creatinine 0.9, glucose 97.  CBC on February 20, 2005 -- white count 9.9,  hemoglobin 10.5, hematocrit 30.7, platelets 188.   CONDITION ON DISCHARGE:  Improved.   INSTRUCTIONS/MEDICATIONS:  1.  Aspirin 325 mg daily.  2.  Toprol XL 100 mg daily.  3.  Lipitor 80 mg daily.  4.  Glucovance 2.5 per 500 mg 2 tabs b.i.d.  5.  Lasix 40 mg daily x3 days.  6.  K-Dur 20 mEq daily x3 days.  7.  Folic acid 1 mg daily.  8.  Lantus 32 units q.h.s.  9.  Ultram 50 mg 1-2 q.4-6h. p.r.n. pain.   ACTIVITY:  No driving, no lifting more than 10 pounds and the patient was instructed to continue daily breathing and walking  exercises.   DIET:  Low salt, low fat and carbohydrate modified, medium calorie.   WOUND CARE:  The patient may shower daily and clean the incisions with soap  and water.  If wound problems arise, she should contact the CVTS office at  [phone].   FOLLOWUP APPOINTMENT:  Dr. Katrinka [name] on March 11, 2005 at 1:30.  A chest x-ray  will be taken to that appointment, which was given to the patient to bring  to the follow-up  appointment with Dr. Jaynie Collins on March 07, 2005 at 11:30 a.m.      AY/MEDQ  D:  05/15/2005  T:  05/15/2005  Job:  [id number]
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
## 2011-09-23 ENCOUNTER — Other Ambulatory Visit: Payer: Self-pay | Admitting: Geriatric Medicine

## 2011-09-23 DIAGNOSIS — Z1231 Encounter for screening mammogram for malignant neoplasm of breast: Secondary | ICD-10-CM

## 2011-10-01 ENCOUNTER — Other Ambulatory Visit: Payer: Self-pay | Admitting: Obstetrics

## 2011-10-01 ENCOUNTER — Other Ambulatory Visit: Payer: Self-pay | Admitting: Geriatric Medicine

## 2011-10-30 ENCOUNTER — Ambulatory Visit
Admission: RE | Admit: 2011-10-30 | Discharge: 2011-10-30 | Disposition: A | Discharge status: Home / Self Care | Payer: Medicare Other | Source: Ambulatory Visit | Attending: Geriatric Medicine | Admitting: Geriatric Medicine

## 2011-10-30 ENCOUNTER — Ambulatory Visit
Admission: RE | Admit: 2011-10-30 | Discharge: 2011-10-30 | Disposition: A | Discharge status: Home / Self Care | Payer: Medicare Other | Source: Ambulatory Visit | Attending: Obstetrics | Admitting: Obstetrics

## 2011-10-30 DIAGNOSIS — Z1231 Encounter for screening mammogram for malignant neoplasm of breast: Secondary | ICD-10-CM

## 2012-01-01 DIAGNOSIS — E538 Deficiency of other specified B group vitamins: Secondary | ICD-10-CM | POA: Diagnosis not present

## 2012-01-16 DIAGNOSIS — IMO0001 Reserved for inherently not codable concepts without codable children: Secondary | ICD-10-CM | POA: Diagnosis not present

## 2012-01-16 DIAGNOSIS — I10 Essential (primary) hypertension: Secondary | ICD-10-CM | POA: Diagnosis not present

## 2012-02-06 DIAGNOSIS — E538 Deficiency of other specified B group vitamins: Secondary | ICD-10-CM | POA: Diagnosis not present

## 2012-02-06 DIAGNOSIS — IMO0001 Reserved for inherently not codable concepts without codable children: Secondary | ICD-10-CM | POA: Diagnosis not present

## 2012-02-19 DIAGNOSIS — I4892 Unspecified atrial flutter: Secondary | ICD-10-CM | POA: Diagnosis not present

## 2012-02-19 DIAGNOSIS — I6350 Cerebral infarction due to unspecified occlusion or stenosis of unspecified cerebral artery: Secondary | ICD-10-CM | POA: Diagnosis not present

## 2012-03-10 DIAGNOSIS — L570 Actinic keratosis: Secondary | ICD-10-CM | POA: Diagnosis not present

## 2012-03-10 DIAGNOSIS — L408 Other psoriasis: Secondary | ICD-10-CM | POA: Diagnosis not present

## 2012-03-10 DIAGNOSIS — L578 Other skin changes due to chronic exposure to nonionizing radiation: Secondary | ICD-10-CM | POA: Diagnosis not present

## 2012-03-24 DIAGNOSIS — L408 Other psoriasis: Secondary | ICD-10-CM | POA: Diagnosis not present

## 2012-03-24 DIAGNOSIS — L4050 Arthropathic psoriasis, unspecified: Secondary | ICD-10-CM | POA: Diagnosis not present

## 2012-03-24 DIAGNOSIS — Z79899 Other long term (current) drug therapy: Secondary | ICD-10-CM | POA: Diagnosis not present

## 2012-03-24 DIAGNOSIS — M2550 Pain in unspecified joint: Secondary | ICD-10-CM | POA: Diagnosis not present

## 2012-04-16 DIAGNOSIS — E782 Mixed hyperlipidemia: Secondary | ICD-10-CM | POA: Diagnosis not present

## 2012-04-16 DIAGNOSIS — D696 Thrombocytopenia, unspecified: Secondary | ICD-10-CM | POA: Diagnosis not present

## 2012-04-16 DIAGNOSIS — I10 Essential (primary) hypertension: Secondary | ICD-10-CM | POA: Diagnosis not present

## 2012-04-16 DIAGNOSIS — E119 Type 2 diabetes mellitus without complications: Secondary | ICD-10-CM | POA: Diagnosis not present

## 2012-04-16 DIAGNOSIS — E538 Deficiency of other specified B group vitamins: Secondary | ICD-10-CM | POA: Diagnosis not present

## 2012-04-16 DIAGNOSIS — Z23 Encounter for immunization: Secondary | ICD-10-CM | POA: Diagnosis not present

## 2012-04-24 DIAGNOSIS — E119 Type 2 diabetes mellitus without complications: Secondary | ICD-10-CM | POA: Diagnosis not present

## 2012-04-24 DIAGNOSIS — H2510 Age-related nuclear cataract, unspecified eye: Secondary | ICD-10-CM | POA: Diagnosis not present

## 2012-05-18 DIAGNOSIS — E538 Deficiency of other specified B group vitamins: Secondary | ICD-10-CM | POA: Diagnosis not present

## 2012-06-10 DIAGNOSIS — E119 Type 2 diabetes mellitus without complications: Secondary | ICD-10-CM | POA: Diagnosis not present

## 2012-06-10 DIAGNOSIS — I10 Essential (primary) hypertension: Secondary | ICD-10-CM | POA: Diagnosis not present

## 2012-06-10 DIAGNOSIS — I2510 Atherosclerotic heart disease of native coronary artery without angina pectoris: Secondary | ICD-10-CM | POA: Diagnosis not present

## 2012-06-10 DIAGNOSIS — I447 Left bundle-branch block, unspecified: Secondary | ICD-10-CM | POA: Diagnosis not present

## 2012-06-10 DIAGNOSIS — Z7901 Long term (current) use of anticoagulants: Secondary | ICD-10-CM | POA: Diagnosis not present

## 2012-06-16 DIAGNOSIS — Z7901 Long term (current) use of anticoagulants: Secondary | ICD-10-CM | POA: Diagnosis not present

## 2012-06-16 DIAGNOSIS — I447 Left bundle-branch block, unspecified: Secondary | ICD-10-CM | POA: Diagnosis not present

## 2012-06-16 DIAGNOSIS — I2510 Atherosclerotic heart disease of native coronary artery without angina pectoris: Secondary | ICD-10-CM | POA: Diagnosis not present

## 2012-06-16 DIAGNOSIS — E119 Type 2 diabetes mellitus without complications: Secondary | ICD-10-CM | POA: Diagnosis not present

## 2012-06-16 DIAGNOSIS — I10 Essential (primary) hypertension: Secondary | ICD-10-CM | POA: Diagnosis not present

## 2012-06-19 DIAGNOSIS — E538 Deficiency of other specified B group vitamins: Secondary | ICD-10-CM | POA: Diagnosis not present

## 2012-06-23 DIAGNOSIS — D539 Nutritional anemia, unspecified: Secondary | ICD-10-CM | POA: Diagnosis not present

## 2012-06-23 DIAGNOSIS — Z79899 Other long term (current) drug therapy: Secondary | ICD-10-CM | POA: Diagnosis not present

## 2012-06-23 DIAGNOSIS — E782 Mixed hyperlipidemia: Secondary | ICD-10-CM | POA: Diagnosis not present

## 2012-06-23 DIAGNOSIS — E1139 Type 2 diabetes mellitus with other diabetic ophthalmic complication: Secondary | ICD-10-CM | POA: Diagnosis not present

## 2012-06-23 DIAGNOSIS — I10 Essential (primary) hypertension: Secondary | ICD-10-CM | POA: Diagnosis not present

## 2012-06-23 DIAGNOSIS — IMO0001 Reserved for inherently not codable concepts without codable children: Secondary | ICD-10-CM | POA: Diagnosis not present

## 2012-06-23 DIAGNOSIS — D649 Anemia, unspecified: Secondary | ICD-10-CM | POA: Diagnosis not present

## 2012-06-23 DIAGNOSIS — R5383 Other fatigue: Secondary | ICD-10-CM | POA: Diagnosis not present

## 2012-06-23 DIAGNOSIS — R5381 Other malaise: Secondary | ICD-10-CM | POA: Diagnosis not present

## 2012-06-23 DIAGNOSIS — E11319 Type 2 diabetes mellitus with unspecified diabetic retinopathy without macular edema: Secondary | ICD-10-CM | POA: Diagnosis not present

## 2012-06-24 DIAGNOSIS — D649 Anemia, unspecified: Secondary | ICD-10-CM | POA: Diagnosis not present

## 2012-07-07 DIAGNOSIS — D649 Anemia, unspecified: Secondary | ICD-10-CM | POA: Diagnosis not present

## 2012-07-07 DIAGNOSIS — Z1211 Encounter for screening for malignant neoplasm of colon: Secondary | ICD-10-CM | POA: Diagnosis not present

## 2012-07-07 DIAGNOSIS — I10 Essential (primary) hypertension: Secondary | ICD-10-CM | POA: Diagnosis not present

## 2012-07-07 DIAGNOSIS — E7800 Pure hypercholesterolemia, unspecified: Secondary | ICD-10-CM | POA: Diagnosis not present

## 2012-07-20 DIAGNOSIS — E538 Deficiency of other specified B group vitamins: Secondary | ICD-10-CM | POA: Diagnosis not present

## 2012-07-22 ENCOUNTER — Telehealth: Payer: Self-pay | Admitting: Hematology & Oncology

## 2012-07-22 NOTE — Telephone Encounter (Signed)
Pt aware of 7-29 appointment °

## 2012-07-24 ENCOUNTER — Other Ambulatory Visit: Payer: Self-pay

## 2012-07-27 ENCOUNTER — Other Ambulatory Visit (HOSPITAL_BASED_OUTPATIENT_CLINIC_OR_DEPARTMENT_OTHER): Payer: Medicare Other | Admitting: Lab

## 2012-07-27 ENCOUNTER — Ambulatory Visit: Payer: Medicare Other

## 2012-07-27 ENCOUNTER — Ambulatory Visit (HOSPITAL_BASED_OUTPATIENT_CLINIC_OR_DEPARTMENT_OTHER): Payer: Medicare Other | Admitting: Hematology & Oncology

## 2012-07-27 VITALS — BP 126/52 | HR 70 | Temp 97.7°F | Ht 63.0 in | Wt 186.0 lb

## 2012-07-27 DIAGNOSIS — N189 Chronic kidney disease, unspecified: Secondary | ICD-10-CM

## 2012-07-27 DIAGNOSIS — D631 Anemia in chronic kidney disease: Secondary | ICD-10-CM

## 2012-07-27 DIAGNOSIS — D649 Anemia, unspecified: Secondary | ICD-10-CM

## 2012-07-27 DIAGNOSIS — D509 Iron deficiency anemia, unspecified: Secondary | ICD-10-CM

## 2012-07-27 LAB — CBC WITH DIFFERENTIAL (CANCER CENTER ONLY)
BASO%: 0.7 % (ref 0.0–2.0)
Eosinophils Absolute: 0.2 10*3/uL (ref 0.0–0.5)
LYMPH%: 46.1 % (ref 14.0–48.0)
MCH: 31.9 pg (ref 26.0–34.0)
MONO%: 7.7 % (ref 0.0–13.0)
NEUT#: 2.5 10*3/uL (ref 1.5–6.5)
Platelets: 176 10*3/uL (ref 145–400)
RBC: 3.92 10*6/uL (ref 3.70–5.32)
RDW: 12.4 % (ref 11.1–15.7)
WBC: 5.9 10*3/uL (ref 3.9–10.0)

## 2012-07-27 NOTE — Progress Notes (Signed)
This office note has been dictated.

## 2012-07-28 LAB — IRON AND TIBC
Iron: 115 ug/dL (ref 42–145)
UIBC: 266 ug/dL (ref 125–400)

## 2012-07-28 LAB — COMPREHENSIVE METABOLIC PANEL
ALT: 15 U/L (ref 0–35)
AST: 15 U/L (ref 0–37)
Alkaline Phosphatase: 64 U/L (ref 39–117)
CO2: 25 mEq/L (ref 19–32)
Creatinine, Ser: 0.93 mg/dL (ref 0.50–1.10)
Sodium: 142 mEq/L (ref 135–145)
Total Bilirubin: 0.6 mg/dL (ref 0.3–1.2)
Total Protein: 6.9 g/dL (ref 6.0–8.3)

## 2012-07-28 LAB — ERYTHROPOIETIN: Erythropoietin: 19.4 m[IU]/mL (ref 2.6–34.0)

## 2012-07-28 LAB — RETICULOCYTES (CHCC)
ABS Retic: 84 10*3/uL (ref 19.0–186.0)
RBC.: 4 MIL/uL (ref 3.87–5.11)
Retic Ct Pct: 2.1 % (ref 0.4–2.3)

## 2012-07-28 NOTE — Progress Notes (Signed)
CC:   Hal T. Stoneking, M.D. Lyn Records, M.D. Zenovia Jordan, MD  DIAGNOSIS:  Transient anemia.  HISTORY PRESENT ILLNESS:  [name] is a very nice, 78 year old white female.  She is actually well known to me.  She is the mother of our esteemed colleague Dr. Mancel Bale.  She does have multiple medical problems appears.  She has psoriatic arthritis.  She has diabetes.  She has history of a stroke on anticoagulation.  She has hypertension.  She has history of coronary artery disease.  She is on several medications.  The patient is followed by Dr. Pete Glatter.  Dr. Pete Glatter has noted that she has become a little more anemic.  As such, he felt that hematologic evaluation was indicated.  [name] has not noted any bleeding.  There has been no melena or bright-red blood per rectum.  She had iron studies done back in June. There was no ferritin that was ordered.  Her iron was 162 with iron saturation of 50%.  She had normal B12.  TSH was normal.  She had a normal BUN and creatinine.  LDH was okay.  Apparently, her bilirubin was up a little bit.  There was only up to 1.2.  This was insignificant from my point of view.  She had a normal sedimentation rate.  She has been feeling fairly well.  She is bothered by all her health issues.  Again, she is on a lot of medications.  She takes Enbrel for psoriatic arthritis.  She had no weight loss or weight gain.  Appetite has been okay.  She is not a vegetarian.  She has had no leg swelling. There has been no rashes.  She has had no dysphasia or odynophagia. Again, Dr. Pete Glatter kindly referred her to the [facility] for evaluation of the anemia.  PAST MEDICAL HISTORY:  Remarkable for: 1. Coronary artery disease, status post CABG. 2. Hypertension. 3. Insulin-dependent diabetes. 4. Psoriatic arthritis. 5. Embolic stroke. 6. Neuropathy following back surgery.  ALLERGIES: 1. Chlorimicetin. 2. Atenolol. 3.  Toprol. 4. Zetia. 5. Penicillin.  MEDICATIONS: 1. Coreg 25 mg p.o. b.i.d. 2. Temovate cream daily. 3. Klonopin 0.5 mg p.o. q.h.s. p.r.n. 4. Vitamin B12 1000 mcg IM q. month. 5. Enbrel is 50 mg q.week. 6. Glipizide XL 5 mg p.o. daily. 7. Lantus insulin 34 units q.h.s. 8. Pradaxa 150 mg p.o. b.i.d. 9. Altace 5 mg p.o. b.i.d. 10.Crestor 10 mg p.o. daily.  SOCIAL HISTORY:  Negative for tobacco use.  There is no alcohol use. There is no obvious occupational exposures.  FAMILY HISTORY:  Relatively noncontributory.  REVIEW OF SYSTEMS:  As stated in the history of present illness.  No additional findings are noted on 12 system review.  PHYSICAL EXAMINATION:  General: This is a an elderly appearing, white female in no obvious distress.  Vital signs:  Temperature of 97.7, pulse 70, respiratory rate 22, blood pressure 126/52.  Weight is 186.  Head and neck:  Normocephalic, atraumatic skull.  There are no ocular or oral lesions.  There are no palpable cervical or supraclavicular lymph nodes. Lungs:  Clear to percussion and auscultation bilaterally.  There may be some slight decrease at the bases.  Cardiac exam:  Regular rate and rhythm with a normal S1 and S2.  There may be a 1/6 systolic ejection murmur.  Abdomen:  Soft with good bowel sounds.  There was no palpable abdominal mass.  There was no fluid wave.  There was no guarding or  rebound tenderness.  There was no palpable hepatosplenomegaly.  Back: Shows some kyphosis.  She had a laminectomy scar in the lower back. There was no tenderness over the spine, ribs, or hips.  Extremities: Show some arthritic changes in her joints.  She does have history of psoriatic arthritis.  She does have some slight joint swelling.  There is no joint erythema or warmth.  I see no edema in her legs.  She has decent pulses in her distal extremities.  Skin:  Shows no rashes, ecchymoses or petechia.  Neurological:  Shows no focal  neurological deficits.  LABORATORY STUDIES:  White cell count 5.9, hemoglobin 12.5, hematocrit 38.2, platelet count 176.  MCV is 92.  Peripheral smear shows a normochromic, normocytic population of red blood cells.  There may be a slight increase in microcytic red cells.  I see no hypochromic red cells.  There were no schistocytes or spherocytes.  I see no target cells.  There is no rouleaux formation.  There is no nucleated red cells.  White cells appear normal in morphology and maturation.  There are no hypersegmented polys.  I see no immature myeloid or lymphoid forms.  There is no atypical lymphocytes.  Platelets are adequate in number and size.  IMPRESSION:  [name] is a very nice 78 year old white female with multiple medical problems.  She has transient anemia.  I certainly do not see anything with respect to her blood work or her blood smear today that looks suspicious. It is hard to say why she had this drop in her hemoglobin.  Her hemoglobin had dropped I think to 10.8. She is on quite a few medications.  I suppose it is possible that there may have been a change in her medication that may have caused some improvement in her bone marrow function. I had to believe that her having psoriatic arthritis may cause some transient anemia of chronic disease.  Again, her hemoglobin was relatively normal today.  I do not believe she is hemolyzing.  Her bilirubin when it was checked was only 1.2.  Again, I do not suspect hemolysis is an issue.  I still wonder that with her diabetes, she may not be able to make a lot of erythropoietin.  We are checking an erythropoietin level on her.  Although she is not microcytic, I am checking iron studies on her.  She is on Pradaxa.  I suppose that she may have some transient bleeding.  I do not see that we have to embark on a major workup here.  She does not need a bone marrow test.  I do not even think any x-ray tests  are necessary.  Hopefully, [name]'s hemoglobin will remain stable.  I think that the less medicine she takes certainly might be helpful in the long run for her.  I was reassured by her blood smear.  I was very reassured by her blood count.  It was nice seeing [name] again.  Usually, I see her at either a football game or basketball game.  I have known her for quite awhile. She is very, very nice.  She came in with Dr. Kalman Drape wife today. We spent a good hour with her.  Again, I do not see that we have to get her back to our office.    ______________________________ Josph Macho, M.D. PRE/MEDQ  D:  07/27/2012  T:  07/28/2012  Job:  [id number]

## 2012-08-14 DIAGNOSIS — N760 Acute vaginitis: Secondary | ICD-10-CM | POA: Diagnosis not present

## 2012-08-18 DIAGNOSIS — D649 Anemia, unspecified: Secondary | ICD-10-CM | POA: Diagnosis not present

## 2012-08-18 DIAGNOSIS — Z1331 Encounter for screening for depression: Secondary | ICD-10-CM | POA: Diagnosis not present

## 2012-08-18 DIAGNOSIS — E7800 Pure hypercholesterolemia, unspecified: Secondary | ICD-10-CM | POA: Diagnosis not present

## 2012-08-18 DIAGNOSIS — N760 Acute vaginitis: Secondary | ICD-10-CM | POA: Diagnosis not present

## 2012-08-18 DIAGNOSIS — Z0000 Encounter for general adult medical examination without abnormal findings: Secondary | ICD-10-CM | POA: Diagnosis not present

## 2012-08-21 DIAGNOSIS — E538 Deficiency of other specified B group vitamins: Secondary | ICD-10-CM | POA: Diagnosis not present

## 2012-09-08 DIAGNOSIS — Z85828 Personal history of other malignant neoplasm of skin: Secondary | ICD-10-CM | POA: Diagnosis not present

## 2012-09-08 DIAGNOSIS — C44320 Squamous cell carcinoma of skin of unspecified parts of face: Secondary | ICD-10-CM | POA: Diagnosis not present

## 2012-09-11 DIAGNOSIS — H01009 Unspecified blepharitis unspecified eye, unspecified eyelid: Secondary | ICD-10-CM | POA: Diagnosis not present

## 2012-09-21 ENCOUNTER — Other Ambulatory Visit: Payer: Self-pay | Admitting: Geriatric Medicine

## 2012-09-21 DIAGNOSIS — Z1231 Encounter for screening mammogram for malignant neoplasm of breast: Secondary | ICD-10-CM

## 2012-09-22 DIAGNOSIS — E538 Deficiency of other specified B group vitamins: Secondary | ICD-10-CM | POA: Diagnosis not present

## 2012-09-22 DIAGNOSIS — L408 Other psoriasis: Secondary | ICD-10-CM | POA: Diagnosis not present

## 2012-09-22 DIAGNOSIS — M2550 Pain in unspecified joint: Secondary | ICD-10-CM | POA: Diagnosis not present

## 2012-09-22 DIAGNOSIS — L4050 Arthropathic psoriasis, unspecified: Secondary | ICD-10-CM | POA: Diagnosis not present

## 2012-09-22 DIAGNOSIS — Z79899 Other long term (current) drug therapy: Secondary | ICD-10-CM | POA: Diagnosis not present

## 2012-09-22 DIAGNOSIS — M1990 Unspecified osteoarthritis, unspecified site: Secondary | ICD-10-CM | POA: Diagnosis not present

## 2012-09-29 DIAGNOSIS — Z01419 Encounter for gynecological examination (general) (routine) without abnormal findings: Secondary | ICD-10-CM | POA: Diagnosis not present

## 2012-09-29 DIAGNOSIS — Z124 Encounter for screening for malignant neoplasm of cervix: Secondary | ICD-10-CM | POA: Diagnosis not present

## 2012-09-29 DIAGNOSIS — B372 Candidiasis of skin and nail: Secondary | ICD-10-CM | POA: Diagnosis not present

## 2012-10-23 DIAGNOSIS — E538 Deficiency of other specified B group vitamins: Secondary | ICD-10-CM | POA: Diagnosis not present

## 2012-10-23 DIAGNOSIS — Z23 Encounter for immunization: Secondary | ICD-10-CM | POA: Diagnosis not present

## 2012-10-30 ENCOUNTER — Ambulatory Visit
Admission: RE | Admit: 2012-10-30 | Discharge: 2012-10-30 | Disposition: A | Discharge status: Home / Self Care | Payer: Medicare Other | Source: Ambulatory Visit | Attending: Geriatric Medicine | Admitting: Geriatric Medicine

## 2012-10-30 DIAGNOSIS — Z1231 Encounter for screening mammogram for malignant neoplasm of breast: Secondary | ICD-10-CM | POA: Diagnosis not present

## 2012-11-02 DIAGNOSIS — I2510 Atherosclerotic heart disease of native coronary artery without angina pectoris: Secondary | ICD-10-CM | POA: Diagnosis not present

## 2012-11-02 DIAGNOSIS — Z7901 Long term (current) use of anticoagulants: Secondary | ICD-10-CM | POA: Diagnosis not present

## 2012-11-02 DIAGNOSIS — I4892 Unspecified atrial flutter: Secondary | ICD-10-CM | POA: Diagnosis not present

## 2012-11-02 DIAGNOSIS — I10 Essential (primary) hypertension: Secondary | ICD-10-CM | POA: Diagnosis not present

## 2012-11-02 DIAGNOSIS — IMO0001 Reserved for inherently not codable concepts without codable children: Secondary | ICD-10-CM | POA: Diagnosis not present

## 2012-11-05 DIAGNOSIS — C44320 Squamous cell carcinoma of skin of unspecified parts of face: Secondary | ICD-10-CM | POA: Diagnosis not present

## 2012-11-23 DIAGNOSIS — E538 Deficiency of other specified B group vitamins: Secondary | ICD-10-CM | POA: Diagnosis not present

## 2012-12-28 DIAGNOSIS — E538 Deficiency of other specified B group vitamins: Secondary | ICD-10-CM | POA: Diagnosis not present

## 2013-02-19 DIAGNOSIS — Z79899 Other long term (current) drug therapy: Secondary | ICD-10-CM | POA: Diagnosis not present

## 2013-02-19 DIAGNOSIS — E782 Mixed hyperlipidemia: Secondary | ICD-10-CM | POA: Diagnosis not present

## 2013-02-19 DIAGNOSIS — I10 Essential (primary) hypertension: Secondary | ICD-10-CM | POA: Diagnosis not present

## 2013-02-19 DIAGNOSIS — E669 Obesity, unspecified: Secondary | ICD-10-CM | POA: Diagnosis not present

## 2013-02-19 DIAGNOSIS — E119 Type 2 diabetes mellitus without complications: Secondary | ICD-10-CM | POA: Diagnosis not present

## 2013-02-19 DIAGNOSIS — D649 Anemia, unspecified: Secondary | ICD-10-CM | POA: Diagnosis not present

## 2013-03-09 DIAGNOSIS — L570 Actinic keratosis: Secondary | ICD-10-CM | POA: Diagnosis not present

## 2013-03-09 DIAGNOSIS — L578 Other skin changes due to chronic exposure to nonionizing radiation: Secondary | ICD-10-CM | POA: Diagnosis not present

## 2013-03-09 DIAGNOSIS — L819 Disorder of pigmentation, unspecified: Secondary | ICD-10-CM | POA: Diagnosis not present

## 2013-03-09 DIAGNOSIS — L408 Other psoriasis: Secondary | ICD-10-CM | POA: Diagnosis not present

## 2013-03-09 DIAGNOSIS — Z85828 Personal history of other malignant neoplasm of skin: Secondary | ICD-10-CM | POA: Diagnosis not present

## 2013-03-22 DIAGNOSIS — E538 Deficiency of other specified B group vitamins: Secondary | ICD-10-CM | POA: Diagnosis not present

## 2013-03-22 DIAGNOSIS — M2550 Pain in unspecified joint: Secondary | ICD-10-CM | POA: Diagnosis not present

## 2013-03-22 DIAGNOSIS — L408 Other psoriasis: Secondary | ICD-10-CM | POA: Diagnosis not present

## 2013-03-22 DIAGNOSIS — M1990 Unspecified osteoarthritis, unspecified site: Secondary | ICD-10-CM | POA: Diagnosis not present

## 2013-03-22 DIAGNOSIS — L4050 Arthropathic psoriasis, unspecified: Secondary | ICD-10-CM | POA: Diagnosis not present

## 2013-04-23 DIAGNOSIS — E538 Deficiency of other specified B group vitamins: Secondary | ICD-10-CM | POA: Diagnosis not present

## 2013-04-26 DIAGNOSIS — E119 Type 2 diabetes mellitus without complications: Secondary | ICD-10-CM | POA: Diagnosis not present

## 2013-04-26 DIAGNOSIS — H2510 Age-related nuclear cataract, unspecified eye: Secondary | ICD-10-CM | POA: Diagnosis not present

## 2013-04-26 DIAGNOSIS — H01009 Unspecified blepharitis unspecified eye, unspecified eyelid: Secondary | ICD-10-CM | POA: Diagnosis not present

## 2013-05-11 DIAGNOSIS — L578 Other skin changes due to chronic exposure to nonionizing radiation: Secondary | ICD-10-CM | POA: Diagnosis not present

## 2013-05-11 DIAGNOSIS — Z85828 Personal history of other malignant neoplasm of skin: Secondary | ICD-10-CM | POA: Diagnosis not present

## 2013-05-11 DIAGNOSIS — L570 Actinic keratosis: Secondary | ICD-10-CM | POA: Diagnosis not present

## 2013-05-25 DIAGNOSIS — E538 Deficiency of other specified B group vitamins: Secondary | ICD-10-CM | POA: Diagnosis not present

## 2013-06-16 DIAGNOSIS — M2550 Pain in unspecified joint: Secondary | ICD-10-CM | POA: Diagnosis not present

## 2013-06-16 DIAGNOSIS — L408 Other psoriasis: Secondary | ICD-10-CM | POA: Diagnosis not present

## 2013-06-16 DIAGNOSIS — Z79899 Other long term (current) drug therapy: Secondary | ICD-10-CM | POA: Diagnosis not present

## 2013-06-16 DIAGNOSIS — L4050 Arthropathic psoriasis, unspecified: Secondary | ICD-10-CM | POA: Diagnosis not present

## 2013-06-30 DIAGNOSIS — E538 Deficiency of other specified B group vitamins: Secondary | ICD-10-CM | POA: Diagnosis not present

## 2013-07-06 DIAGNOSIS — I10 Essential (primary) hypertension: Secondary | ICD-10-CM | POA: Diagnosis not present

## 2013-07-06 DIAGNOSIS — IMO0002 Reserved for concepts with insufficient information to code with codable children: Secondary | ICD-10-CM | POA: Diagnosis not present

## 2013-07-06 DIAGNOSIS — E119 Type 2 diabetes mellitus without complications: Secondary | ICD-10-CM | POA: Diagnosis not present

## 2013-07-06 DIAGNOSIS — Z79899 Other long term (current) drug therapy: Secondary | ICD-10-CM | POA: Diagnosis not present

## 2013-07-14 DIAGNOSIS — E119 Type 2 diabetes mellitus without complications: Secondary | ICD-10-CM | POA: Diagnosis not present

## 2013-07-14 DIAGNOSIS — IMO0002 Reserved for concepts with insufficient information to code with codable children: Secondary | ICD-10-CM | POA: Diagnosis not present

## 2013-07-14 DIAGNOSIS — I10 Essential (primary) hypertension: Secondary | ICD-10-CM | POA: Diagnosis not present

## 2013-07-26 DIAGNOSIS — R1900 Intra-abdominal and pelvic swelling, mass and lump, unspecified site: Secondary | ICD-10-CM | POA: Diagnosis not present

## 2013-07-26 DIAGNOSIS — N760 Acute vaginitis: Secondary | ICD-10-CM | POA: Diagnosis not present

## 2013-07-26 DIAGNOSIS — R82998 Other abnormal findings in urine: Secondary | ICD-10-CM | POA: Diagnosis not present

## 2013-08-02 DIAGNOSIS — E538 Deficiency of other specified B group vitamins: Secondary | ICD-10-CM | POA: Diagnosis not present

## 2013-08-25 DIAGNOSIS — Z0000 Encounter for general adult medical examination without abnormal findings: Secondary | ICD-10-CM | POA: Diagnosis not present

## 2013-08-25 DIAGNOSIS — Z1331 Encounter for screening for depression: Secondary | ICD-10-CM | POA: Diagnosis not present

## 2013-09-03 DIAGNOSIS — E538 Deficiency of other specified B group vitamins: Secondary | ICD-10-CM | POA: Diagnosis not present

## 2013-09-08 DIAGNOSIS — L821 Other seborrheic keratosis: Secondary | ICD-10-CM | POA: Diagnosis not present

## 2013-09-08 DIAGNOSIS — Z85828 Personal history of other malignant neoplasm of skin: Secondary | ICD-10-CM | POA: Diagnosis not present

## 2013-09-08 DIAGNOSIS — L570 Actinic keratosis: Secondary | ICD-10-CM | POA: Diagnosis not present

## 2013-09-30 DIAGNOSIS — Z01419 Encounter for gynecological examination (general) (routine) without abnormal findings: Secondary | ICD-10-CM | POA: Diagnosis not present

## 2013-09-30 DIAGNOSIS — R300 Dysuria: Secondary | ICD-10-CM | POA: Diagnosis not present

## 2013-09-30 DIAGNOSIS — N9489 Other specified conditions associated with female genital organs and menstrual cycle: Secondary | ICD-10-CM | POA: Diagnosis not present

## 2013-09-30 DIAGNOSIS — B372 Candidiasis of skin and nail: Secondary | ICD-10-CM | POA: Diagnosis not present

## 2013-09-30 DIAGNOSIS — Z124 Encounter for screening for malignant neoplasm of cervix: Secondary | ICD-10-CM | POA: Diagnosis not present

## 2013-09-30 DIAGNOSIS — R3129 Other microscopic hematuria: Secondary | ICD-10-CM | POA: Diagnosis not present

## 2013-10-04 ENCOUNTER — Other Ambulatory Visit: Payer: Self-pay

## 2013-10-04 DIAGNOSIS — Z1231 Encounter for screening mammogram for malignant neoplasm of breast: Secondary | ICD-10-CM

## 2013-10-04 DIAGNOSIS — E538 Deficiency of other specified B group vitamins: Secondary | ICD-10-CM | POA: Diagnosis not present

## 2013-10-12 DIAGNOSIS — IMO0002 Reserved for concepts with insufficient information to code with codable children: Secondary | ICD-10-CM | POA: Diagnosis not present

## 2013-10-12 DIAGNOSIS — R3129 Other microscopic hematuria: Secondary | ICD-10-CM | POA: Diagnosis not present

## 2013-10-12 DIAGNOSIS — N949 Unspecified condition associated with female genital organs and menstrual cycle: Secondary | ICD-10-CM | POA: Diagnosis not present

## 2013-10-27 DIAGNOSIS — Z85828 Personal history of other malignant neoplasm of skin: Secondary | ICD-10-CM | POA: Diagnosis not present

## 2013-10-27 DIAGNOSIS — L821 Other seborrheic keratosis: Secondary | ICD-10-CM | POA: Diagnosis not present

## 2013-10-27 DIAGNOSIS — L909 Atrophic disorder of skin, unspecified: Secondary | ICD-10-CM | POA: Diagnosis not present

## 2013-11-02 ENCOUNTER — Encounter: Payer: Self-pay | Admitting: Interventional Cardiology

## 2013-11-02 ENCOUNTER — Ambulatory Visit (INDEPENDENT_AMBULATORY_CARE_PROVIDER_SITE_OTHER): Payer: Medicare Other | Admitting: Interventional Cardiology

## 2013-11-02 ENCOUNTER — Encounter (INDEPENDENT_AMBULATORY_CARE_PROVIDER_SITE_OTHER): Payer: Self-pay

## 2013-11-02 VITALS — BP 132/60 | HR 74 | Ht 62.0 in | Wt 191.0 lb

## 2013-11-02 DIAGNOSIS — I2510 Atherosclerotic heart disease of native coronary artery without angina pectoris: Secondary | ICD-10-CM

## 2013-11-02 DIAGNOSIS — Z7901 Long term (current) use of anticoagulants: Secondary | ICD-10-CM

## 2013-11-02 DIAGNOSIS — I480 Paroxysmal atrial fibrillation: Secondary | ICD-10-CM | POA: Insufficient documentation

## 2013-11-02 DIAGNOSIS — I4892 Unspecified atrial flutter: Secondary | ICD-10-CM

## 2013-11-02 DIAGNOSIS — I25709 Atherosclerosis of coronary artery bypass graft(s), unspecified, with unspecified angina pectoris: Secondary | ICD-10-CM | POA: Insufficient documentation

## 2013-11-02 DIAGNOSIS — I10 Essential (primary) hypertension: Secondary | ICD-10-CM

## 2013-11-02 HISTORY — DX: Long term (current) use of anticoagulants: Z79.01

## 2013-11-02 HISTORY — DX: Essential (primary) hypertension: I10

## 2013-11-02 HISTORY — DX: Unspecified atrial flutter: I48.92

## 2013-11-02 NOTE — Patient Instructions (Signed)
Your physician recommends that you continue on your current medications as directed. Please refer to the Current Medication list given to you today.  Your physician wants you to follow-up in: 1 year You will receive a reminder letter in the mail two months in advance. If you don't receive a letter, please call our office to schedule the follow-up appointment.  Keep an active lifestyle

## 2013-11-02 NOTE — Progress Notes (Signed)
Patient ID: [name], female   DOB: 1934/09/30, 79 y.o.   MRN: [id number]    [address] [place], [place]  [address] Phone: [phone] Fax:  [phone]  Date:  11/02/2013   ID:  [name], DOB 1934/01/17, MRN [id number]  PCP:  No primary provider on file.   ASSESSMENT:  1. Coronary artery disease, asymptomatic 2. Paroxysmal atrial flutter 3. Hypertension 4. Chronic long-term anticoagulation with Pradaxa  PLAN:  1. No change in therapy 2. No aspirin therapy while on NOAC 3. Clinical followup in one year   SUBJECTIVE: [name] is a 79 y.o. female is doing well from cardiac standpoint. She is very brief episodes of palpitation. None lasting longer than 10 seconds. She denies lightheadedness, dizziness, chest pain, edema, and syncope. She's had no episodes of angina. She does have intermittently recurring left lateral chest discomfort that is relieved by position change. No transient neurological complaints. Trace lower extremity edema has been noted over the last year. This occurs predominantly when she sits for extended periods of time. On Pradaxai she has had no bleeding in urine, stool, or had falls or trauma that precipitated bleeding   Wt Readings from Last 3 Encounters:  11/02/13 191 lb (86.637 kg)  07/27/12 186 lb (84.369 kg)     Past Medical History  Diagnosis Date  . Long term (current) use of anticoagulants 11/02/2013    Pradaxa   . Atrial flutter 11/02/2013  . Essential hypertension, benign 11/02/2013  . Coronary artery disease     Current Outpatient Prescriptions  Medication Sig Dispense Refill  . carvedilol (COREG) 25 MG tablet 2 (two) times daily with a meal.       . Cholecalciferol (VITAMIN D) 1000 UNITS capsule Take 1,000 Units by mouth 2 (two) times daily.      . clobetasol cream (TEMOVATE) 0.05 %       . clonazePAM (KLONOPIN) 0.5 MG tablet at bedtime as needed.       . Cyanocobalamin (VITAMIN B 12 PO) Inject 1,000 mcg as  directed every 30 (thirty) days.      . cyclobenzaprine (FLEXERIL) 10 MG tablet Take 10 mg by mouth as needed.      . ENBREL 50 MG/ML injection once a week.       Marland Kitchen GLIPIZIDE XL 5 MG 24 hr tablet every morning.       Marland Kitchen LANTUS 100 UNIT/ML injection at bedtime. 34 Units      . PRADAXA 150 MG CAPS every 12 (twelve) hours.       . ramipril (ALTACE) 5 MG capsule 2 (two) times daily.       . rosuvastatin (CRESTOR) 10 MG tablet Take 10 mg by mouth daily.       No current facility-administered medications for this visit.    Allergies:    Allergies  Allergen Reactions  . Penicillins     Swelling of tongue    Social History:  The patient  reports that she has never smoked. She does not have any smokeless tobacco history on file.   ROS:  Please see the history of present illness.   Denies palpitations. There are no cardiac complaints. She has not had syncope. She denies transient neurological symptoms. Her major limitations are related to orthopedic problems. She is musculoskeletal chest pain.  All other systems reviewed and negative.   OBJECTIVE: VS:  Ht 5\' 2"  (1.575 m)  Wt 191 lb (86.637 kg)  BMI 34.93  kg/m2 Well nourished, well developed, in no acute distress, appears her stated age HEENT: normal Neck: JVD flat. Carotid bruit absent  Cardiac:  normal S1, S2; RRR; no murmur Lungs:  clear to auscultation bilaterally, no wheezing, rhonchi or rales Abd: soft, nontender, no hepatomegaly Ext: Edema trace bilateral. Pulses 2+ and symmetric Skin: warm and dry Neuro:  CNs 2-12 intact, no focal abnormalities noted  EKG:  NSR with PRWP       Signed, Darci Needle III, MD 11/02/2013 10:43 AM

## 2013-11-03 ENCOUNTER — Ambulatory Visit
Admission: RE | Admit: 2013-11-03 | Discharge: 2013-11-03 | Disposition: A | Discharge status: Home / Self Care | Payer: Medicare Other | Source: Ambulatory Visit

## 2013-11-03 DIAGNOSIS — Z1231 Encounter for screening mammogram for malignant neoplasm of breast: Secondary | ICD-10-CM | POA: Diagnosis not present

## 2013-11-04 ENCOUNTER — Other Ambulatory Visit: Payer: Self-pay

## 2013-11-04 DIAGNOSIS — N760 Acute vaginitis: Secondary | ICD-10-CM | POA: Diagnosis not present

## 2013-11-04 DIAGNOSIS — R3129 Other microscopic hematuria: Secondary | ICD-10-CM | POA: Diagnosis not present

## 2013-11-05 DIAGNOSIS — E538 Deficiency of other specified B group vitamins: Secondary | ICD-10-CM | POA: Diagnosis not present

## 2013-11-05 DIAGNOSIS — Z23 Encounter for immunization: Secondary | ICD-10-CM | POA: Diagnosis not present

## 2013-11-09 DIAGNOSIS — R1031 Right lower quadrant pain: Secondary | ICD-10-CM | POA: Diagnosis not present

## 2013-11-09 DIAGNOSIS — D649 Anemia, unspecified: Secondary | ICD-10-CM | POA: Diagnosis not present

## 2013-11-09 DIAGNOSIS — Z1211 Encounter for screening for malignant neoplasm of colon: Secondary | ICD-10-CM | POA: Diagnosis not present

## 2013-11-16 DIAGNOSIS — I10 Essential (primary) hypertension: Secondary | ICD-10-CM | POA: Diagnosis not present

## 2013-11-16 DIAGNOSIS — E119 Type 2 diabetes mellitus without complications: Secondary | ICD-10-CM | POA: Diagnosis not present

## 2013-11-16 DIAGNOSIS — Z79899 Other long term (current) drug therapy: Secondary | ICD-10-CM | POA: Diagnosis not present

## 2013-11-16 DIAGNOSIS — M549 Dorsalgia, unspecified: Secondary | ICD-10-CM | POA: Diagnosis not present

## 2013-11-18 ENCOUNTER — Other Ambulatory Visit: Payer: Self-pay | Admitting: Neurosurgery

## 2013-11-18 DIAGNOSIS — L4050 Arthropathic psoriasis, unspecified: Secondary | ICD-10-CM | POA: Diagnosis not present

## 2013-11-18 DIAGNOSIS — Z79899 Other long term (current) drug therapy: Secondary | ICD-10-CM | POA: Diagnosis not present

## 2013-11-18 DIAGNOSIS — M47817 Spondylosis without myelopathy or radiculopathy, lumbosacral region: Secondary | ICD-10-CM

## 2013-11-18 DIAGNOSIS — L408 Other psoriasis: Secondary | ICD-10-CM | POA: Diagnosis not present

## 2013-11-18 DIAGNOSIS — M2550 Pain in unspecified joint: Secondary | ICD-10-CM | POA: Diagnosis not present

## 2013-11-30 DIAGNOSIS — R1031 Right lower quadrant pain: Secondary | ICD-10-CM | POA: Diagnosis not present

## 2013-12-02 ENCOUNTER — Other Ambulatory Visit: Payer: Self-pay | Admitting: Interventional Cardiology

## 2013-12-03 ENCOUNTER — Ambulatory Visit
Admission: RE | Admit: 2013-12-03 | Discharge: 2013-12-03 | Disposition: A | Discharge status: Home / Self Care | Payer: Medicare Other | Source: Ambulatory Visit | Attending: Neurosurgery | Admitting: Neurosurgery

## 2013-12-03 DIAGNOSIS — M48061 Spinal stenosis, lumbar region without neurogenic claudication: Secondary | ICD-10-CM | POA: Diagnosis not present

## 2013-12-03 DIAGNOSIS — M5126 Other intervertebral disc displacement, lumbar region: Secondary | ICD-10-CM | POA: Diagnosis not present

## 2013-12-03 DIAGNOSIS — M47817 Spondylosis without myelopathy or radiculopathy, lumbosacral region: Secondary | ICD-10-CM | POA: Diagnosis not present

## 2013-12-03 MED ORDER — GADOBENATE DIMEGLUMINE 529 MG/ML IV SOLN: 18.0000 mL | Freq: Once | INTRAVENOUS | Status: AC | PRN

## 2013-12-06 DIAGNOSIS — M545 Low back pain, unspecified: Secondary | ICD-10-CM | POA: Diagnosis not present

## 2013-12-06 DIAGNOSIS — M4310 Spondylolisthesis, site unspecified: Secondary | ICD-10-CM | POA: Diagnosis not present

## 2013-12-06 DIAGNOSIS — M7130 Other bursal cyst, unspecified site: Secondary | ICD-10-CM | POA: Diagnosis not present

## 2013-12-06 DIAGNOSIS — IMO0002 Reserved for concepts with insufficient information to code with codable children: Secondary | ICD-10-CM | POA: Diagnosis not present

## 2013-12-06 DIAGNOSIS — E538 Deficiency of other specified B group vitamins: Secondary | ICD-10-CM | POA: Diagnosis not present

## 2013-12-15 DIAGNOSIS — D649 Anemia, unspecified: Secondary | ICD-10-CM | POA: Diagnosis not present

## 2013-12-15 DIAGNOSIS — R1031 Right lower quadrant pain: Secondary | ICD-10-CM | POA: Diagnosis not present

## 2013-12-16 ENCOUNTER — Other Ambulatory Visit: Payer: Self-pay | Admitting: Interventional Cardiology

## 2013-12-30 DIAGNOSIS — R519 Headache, unspecified: Secondary | ICD-10-CM

## 2013-12-30 HISTORY — DX: Headache, unspecified: R51.9

## 2014-01-06 DIAGNOSIS — E538 Deficiency of other specified B group vitamins: Secondary | ICD-10-CM | POA: Diagnosis not present

## 2014-01-14 DIAGNOSIS — B373 Candidiasis of vulva and vagina: Secondary | ICD-10-CM | POA: Diagnosis not present

## 2014-01-14 DIAGNOSIS — B3731 Acute candidiasis of vulva and vagina: Secondary | ICD-10-CM | POA: Diagnosis not present

## 2014-01-14 DIAGNOSIS — L940 Localized scleroderma [morphea]: Secondary | ICD-10-CM | POA: Diagnosis not present

## 2014-02-07 DIAGNOSIS — E538 Deficiency of other specified B group vitamins: Secondary | ICD-10-CM | POA: Diagnosis not present

## 2014-03-08 DIAGNOSIS — E538 Deficiency of other specified B group vitamins: Secondary | ICD-10-CM | POA: Diagnosis not present

## 2014-03-14 ENCOUNTER — Other Ambulatory Visit: Payer: Self-pay

## 2014-03-14 MED ORDER — NITROGLYCERIN 0.4 MG SL SUBL: 0.4000 mg | SUBLINGUAL_TABLET | SUBLINGUAL | Status: DC | PRN

## 2014-03-16 DIAGNOSIS — M2550 Pain in unspecified joint: Secondary | ICD-10-CM | POA: Diagnosis not present

## 2014-03-16 DIAGNOSIS — L408 Other psoriasis: Secondary | ICD-10-CM | POA: Diagnosis not present

## 2014-03-16 DIAGNOSIS — Z79899 Other long term (current) drug therapy: Secondary | ICD-10-CM | POA: Diagnosis not present

## 2014-03-16 DIAGNOSIS — L4050 Arthropathic psoriasis, unspecified: Secondary | ICD-10-CM | POA: Diagnosis not present

## 2014-03-22 DIAGNOSIS — E119 Type 2 diabetes mellitus without complications: Secondary | ICD-10-CM | POA: Diagnosis not present

## 2014-03-22 DIAGNOSIS — I10 Essential (primary) hypertension: Secondary | ICD-10-CM | POA: Diagnosis not present

## 2014-03-22 DIAGNOSIS — Z23 Encounter for immunization: Secondary | ICD-10-CM | POA: Diagnosis not present

## 2014-03-22 DIAGNOSIS — L570 Actinic keratosis: Secondary | ICD-10-CM | POA: Diagnosis not present

## 2014-03-22 DIAGNOSIS — Z85828 Personal history of other malignant neoplasm of skin: Secondary | ICD-10-CM | POA: Diagnosis not present

## 2014-03-22 DIAGNOSIS — L821 Other seborrheic keratosis: Secondary | ICD-10-CM | POA: Diagnosis not present

## 2014-03-22 DIAGNOSIS — D1801 Hemangioma of skin and subcutaneous tissue: Secondary | ICD-10-CM | POA: Diagnosis not present

## 2014-03-22 DIAGNOSIS — E782 Mixed hyperlipidemia: Secondary | ICD-10-CM | POA: Diagnosis not present

## 2014-04-06 DIAGNOSIS — H905 Unspecified sensorineural hearing loss: Secondary | ICD-10-CM | POA: Diagnosis not present

## 2014-04-06 DIAGNOSIS — H903 Sensorineural hearing loss, bilateral: Secondary | ICD-10-CM | POA: Diagnosis not present

## 2014-04-07 ENCOUNTER — Other Ambulatory Visit: Payer: Self-pay | Admitting: Otolaryngology

## 2014-04-07 DIAGNOSIS — H1045 Other chronic allergic conjunctivitis: Secondary | ICD-10-CM | POA: Diagnosis not present

## 2014-04-07 DIAGNOSIS — H903 Sensorineural hearing loss, bilateral: Secondary | ICD-10-CM

## 2014-04-07 DIAGNOSIS — H905 Unspecified sensorineural hearing loss: Secondary | ICD-10-CM

## 2014-04-11 DIAGNOSIS — E538 Deficiency of other specified B group vitamins: Secondary | ICD-10-CM | POA: Diagnosis not present

## 2014-04-18 ENCOUNTER — Ambulatory Visit
Admission: RE | Admit: 2014-04-18 | Discharge: 2014-04-18 | Disposition: A | Discharge status: Home / Self Care | Payer: Medicare Other | Source: Ambulatory Visit | Attending: Otolaryngology | Admitting: Otolaryngology

## 2014-04-18 DIAGNOSIS — H903 Sensorineural hearing loss, bilateral: Secondary | ICD-10-CM

## 2014-04-18 DIAGNOSIS — H905 Unspecified sensorineural hearing loss: Secondary | ICD-10-CM

## 2014-04-18 DIAGNOSIS — H918X9 Other specified hearing loss, unspecified ear: Secondary | ICD-10-CM | POA: Diagnosis not present

## 2014-04-18 MED ORDER — GADOBENATE DIMEGLUMINE 529 MG/ML IV SOLN
18.0000 mL | Freq: Once | INTRAVENOUS | Status: AC | PRN
  Administered 2014-04-18: 18 mL via INTRAVENOUS

## 2014-05-12 DIAGNOSIS — E538 Deficiency of other specified B group vitamins: Secondary | ICD-10-CM | POA: Diagnosis not present

## 2014-06-09 DIAGNOSIS — L408 Other psoriasis: Secondary | ICD-10-CM | POA: Diagnosis not present

## 2014-06-09 DIAGNOSIS — Z79899 Other long term (current) drug therapy: Secondary | ICD-10-CM | POA: Diagnosis not present

## 2014-06-09 DIAGNOSIS — L4050 Arthropathic psoriasis, unspecified: Secondary | ICD-10-CM | POA: Diagnosis not present

## 2014-06-09 DIAGNOSIS — M2550 Pain in unspecified joint: Secondary | ICD-10-CM | POA: Diagnosis not present

## 2014-06-13 ENCOUNTER — Other Ambulatory Visit: Payer: Self-pay | Admitting: Interventional Cardiology

## 2014-06-13 DIAGNOSIS — E538 Deficiency of other specified B group vitamins: Secondary | ICD-10-CM | POA: Diagnosis not present

## 2014-06-21 DIAGNOSIS — H25019 Cortical age-related cataract, unspecified eye: Secondary | ICD-10-CM | POA: Diagnosis not present

## 2014-06-21 DIAGNOSIS — E11319 Type 2 diabetes mellitus with unspecified diabetic retinopathy without macular edema: Secondary | ICD-10-CM | POA: Diagnosis not present

## 2014-06-21 DIAGNOSIS — E1139 Type 2 diabetes mellitus with other diabetic ophthalmic complication: Secondary | ICD-10-CM | POA: Diagnosis not present

## 2014-06-21 DIAGNOSIS — H2510 Age-related nuclear cataract, unspecified eye: Secondary | ICD-10-CM | POA: Diagnosis not present

## 2014-07-14 DIAGNOSIS — E538 Deficiency of other specified B group vitamins: Secondary | ICD-10-CM | POA: Diagnosis not present

## 2014-08-04 ENCOUNTER — Emergency Department (HOSPITAL_COMMUNITY): Payer: Medicare Other

## 2014-08-04 ENCOUNTER — Encounter (HOSPITAL_COMMUNITY): Payer: Self-pay | Admitting: Emergency Medicine

## 2014-08-04 ENCOUNTER — Observation Stay (HOSPITAL_COMMUNITY)
Admission: EM | Admit: 2014-08-04 | Discharge: 2014-08-05 | Disposition: A | Discharge status: Home / Self Care | Payer: Medicare Other | Attending: Internal Medicine | Admitting: Internal Medicine

## 2014-08-04 DIAGNOSIS — Z7901 Long term (current) use of anticoagulants: Secondary | ICD-10-CM

## 2014-08-04 DIAGNOSIS — I480 Paroxysmal atrial fibrillation: Secondary | ICD-10-CM | POA: Diagnosis present

## 2014-08-04 DIAGNOSIS — E785 Hyperlipidemia, unspecified: Secondary | ICD-10-CM | POA: Diagnosis not present

## 2014-08-04 DIAGNOSIS — G458 Other transient cerebral ischemic attacks and related syndromes: Secondary | ICD-10-CM

## 2014-08-04 DIAGNOSIS — I10 Essential (primary) hypertension: Secondary | ICD-10-CM

## 2014-08-04 DIAGNOSIS — E119 Type 2 diabetes mellitus without complications: Secondary | ICD-10-CM | POA: Diagnosis not present

## 2014-08-04 DIAGNOSIS — I252 Old myocardial infarction: Secondary | ICD-10-CM | POA: Diagnosis not present

## 2014-08-04 DIAGNOSIS — G43109 Migraine with aura, not intractable, without status migrainosus: Principal | ICD-10-CM

## 2014-08-04 DIAGNOSIS — Z79899 Other long term (current) drug therapy: Secondary | ICD-10-CM | POA: Diagnosis not present

## 2014-08-04 DIAGNOSIS — I483 Typical atrial flutter: Secondary | ICD-10-CM

## 2014-08-04 DIAGNOSIS — Z794 Long term (current) use of insulin: Secondary | ICD-10-CM | POA: Insufficient documentation

## 2014-08-04 DIAGNOSIS — Z951 Presence of aortocoronary bypass graft: Secondary | ICD-10-CM | POA: Diagnosis not present

## 2014-08-04 DIAGNOSIS — G459 Transient cerebral ischemic attack, unspecified: Secondary | ICD-10-CM | POA: Diagnosis not present

## 2014-08-04 DIAGNOSIS — R4701 Aphasia: Secondary | ICD-10-CM | POA: Insufficient documentation

## 2014-08-04 DIAGNOSIS — I4892 Unspecified atrial flutter: Secondary | ICD-10-CM

## 2014-08-04 DIAGNOSIS — E1059 Type 1 diabetes mellitus with other circulatory complications: Secondary | ICD-10-CM

## 2014-08-04 DIAGNOSIS — I2510 Atherosclerotic heart disease of native coronary artery without angina pectoris: Secondary | ICD-10-CM | POA: Insufficient documentation

## 2014-08-04 DIAGNOSIS — R51 Headache: Secondary | ICD-10-CM | POA: Diagnosis not present

## 2014-08-04 DIAGNOSIS — R209 Unspecified disturbances of skin sensation: Secondary | ICD-10-CM | POA: Diagnosis not present

## 2014-08-04 HISTORY — DX: Type 2 diabetes mellitus without complications: E11.9

## 2014-08-04 HISTORY — DX: Cerebral infarction, unspecified: I63.9

## 2014-08-04 LAB — COMPREHENSIVE METABOLIC PANEL
ALK PHOS: 57 U/L (ref 39–117)
ALT: 15 U/L (ref 0–35)
AST: 17 U/L (ref 0–37)
Albumin: 3.9 g/dL (ref 3.5–5.2)
Anion gap: 13 (ref 5–15)
BUN: 17 mg/dL (ref 6–23)
CO2: 25 meq/L (ref 19–32)
Calcium: 9.2 mg/dL (ref 8.4–10.5)
Chloride: 103 mEq/L (ref 96–112)
Creatinine, Ser: 0.67 mg/dL (ref 0.50–1.10)
GFR calc Af Amer: >90 mL/min (ref >90)
GFR calc non Af Amer: 82 mL/min — ABNORMAL LOW (ref >90)
GLUCOSE: 216 mg/dL — AB (ref 70–99)
POTASSIUM: 4.4 meq/L (ref 3.7–5.3)
SODIUM: 141 meq/L (ref 137–147)
Total Bilirubin: 0.3 mg/dL (ref 0.3–1.2)
Total Protein: 6.6 g/dL (ref 6.0–8.3)

## 2014-08-04 LAB — DIFFERENTIAL
Basophils Absolute: 0 10*3/uL (ref 0.0–0.1)
Basophils Relative: 0 % (ref 0–1)
Eosinophils Absolute: 0.2 10*3/uL (ref 0.0–0.7)
Eosinophils Relative: 2 % (ref 0–5)
LYMPHS ABS: 3.2 10*3/uL (ref 0.7–4.0)
LYMPHS PCT: 43 % (ref 12–46)
Monocytes Absolute: 0.5 10*3/uL (ref 0.1–1.0)
Monocytes Relative: 7 % (ref 3–12)
NEUTROS ABS: 3.5 10*3/uL (ref 1.7–7.7)
NEUTROS PCT: 48 % (ref 43–77)

## 2014-08-04 LAB — CBC
HCT: 39.5 % (ref 36.0–46.0)
Hemoglobin: 13.2 g/dL (ref 12.0–15.0)
MCH: 30.3 pg (ref 26.0–34.0)
MCHC: 33.4 g/dL (ref 30.0–36.0)
MCV: 90.8 fL (ref 78.0–100.0)
PLATELETS: 180 10*3/uL (ref 150–400)
RBC: 4.35 MIL/uL (ref 3.87–5.11)
RDW: 12.6 % (ref 11.5–15.5)
WBC: 7.3 10*3/uL (ref 4.0–10.5)

## 2014-08-04 LAB — I-STAT CHEM 8, ED
BUN: 18 mg/dL (ref 6–23)
Calcium, Ion: 1.21 mmol/L (ref 1.13–1.30)
Chloride: 101 mEq/L (ref 96–112)
Creatinine, Ser: 0.8 mg/dL (ref 0.50–1.10)
Glucose, Bld: 227 mg/dL — ABNORMAL HIGH (ref 70–99)
HCT: 39 % (ref 36.0–46.0)
Hemoglobin: 13.3 g/dL (ref 12.0–15.0)
Potassium: 4.2 mEq/L (ref 3.7–5.3)
SODIUM: 139 meq/L (ref 137–147)
TCO2: 24 mmol/L (ref 0–100)

## 2014-08-04 LAB — PROTIME-INR
INR: 1.5 — AB (ref 0.00–1.49)
PROTHROMBIN TIME: 18.1 s — AB (ref 11.6–15.2)

## 2014-08-04 LAB — GLUCOSE, CAPILLARY: Glucose-Capillary: 255 mg/dL — ABNORMAL HIGH (ref 70–99)

## 2014-08-04 LAB — I-STAT TROPONIN, ED: Troponin i, poc: 0 ng/mL (ref 0.00–0.08)

## 2014-08-04 LAB — APTT: APTT: 47 s — AB (ref 24–37)

## 2014-08-04 LAB — CBG MONITORING, ED: Glucose-Capillary: 225 mg/dL — ABNORMAL HIGH (ref 70–99)

## 2014-08-04 MED ORDER — CALCIUM CARBONATE ANTACID 750 MG PO CHEW: 1.0000 | CHEWABLE_TABLET | Freq: Every day | ORAL | Status: DC

## 2014-08-04 MED ORDER — ATORVASTATIN CALCIUM 20 MG PO TABS: 20.0000 mg | ORAL_TABLET | Freq: Every day | ORAL | Status: DC

## 2014-08-04 MED ORDER — DABIGATRAN ETEXILATE MESYLATE 150 MG PO CAPS
150.0000 mg | ORAL_CAPSULE | Freq: Two times a day (BID) | ORAL | Status: DC
  Administered 2014-08-04 – 2014-08-05 (×2): 150 mg via ORAL
  Filled 2014-08-04 (×3): qty 1

## 2014-08-04 MED ORDER — GLIPIZIDE ER 5 MG PO TB24
5.0000 mg | ORAL_TABLET | Freq: Two times a day (BID) | ORAL | Status: DC
  Administered 2014-08-05: 5 mg via ORAL
  Filled 2014-08-04 (×3): qty 1

## 2014-08-04 MED ORDER — RAMIPRIL 5 MG PO CAPS
5.0000 mg | ORAL_CAPSULE | Freq: Two times a day (BID) | ORAL | Status: DC
  Administered 2014-08-04 – 2014-08-05 (×2): 5 mg via ORAL
  Filled 2014-08-04 (×2): qty 1

## 2014-08-04 MED ORDER — DOCUSATE SODIUM 100 MG PO CAPS
100.0000 mg | ORAL_CAPSULE | Freq: Every day | ORAL | Status: DC | PRN
  Administered 2014-08-04: 100 mg via ORAL
  Filled 2014-08-04: qty 1

## 2014-08-04 MED ORDER — METFORMIN HCL 500 MG PO TABS: 1000.0000 mg | ORAL_TABLET | Freq: Two times a day (BID) | ORAL | Status: DC

## 2014-08-04 MED ORDER — VITAMIN D 1000 UNITS PO TABS
1000.0000 [IU] | ORAL_TABLET | Freq: Every day | ORAL | Status: DC
  Administered 2014-08-05: 1000 [IU] via ORAL
  Filled 2014-08-04: qty 1

## 2014-08-04 MED ORDER — STROKE: EARLY STAGES OF RECOVERY BOOK
Freq: Once | Status: AC
  Administered 2014-08-04
  Filled 2014-08-04: qty 1

## 2014-08-04 MED ORDER — ETANERCEPT 50 MG/ML ~~LOC~~ SOSY: 50.0000 mg | PREFILLED_SYRINGE | SUBCUTANEOUS | Status: DC

## 2014-08-04 MED ORDER — VITAMIN D 1000 UNITS PO CAPS: 1000.0000 [IU] | ORAL_CAPSULE | Freq: Two times a day (BID) | ORAL | Status: DC

## 2014-08-04 MED ORDER — CLONAZEPAM 0.5 MG PO TABS: 0.2500 mg | ORAL_TABLET | Freq: Every evening | ORAL | Status: DC | PRN

## 2014-08-04 MED ORDER — INSULIN GLARGINE 100 UNIT/ML ~~LOC~~ SOLN
40.0000 [IU] | Freq: Every day | SUBCUTANEOUS | Status: DC
  Administered 2014-08-05: 40 [IU] via SUBCUTANEOUS
  Filled 2014-08-04 (×2): qty 0.4

## 2014-08-04 MED ORDER — CALCIUM CARBONATE ANTACID 500 MG PO CHEW
400.0000 mg | CHEWABLE_TABLET | Freq: Every day | ORAL | Status: DC
  Administered 2014-08-05: 400 mg via ORAL
  Filled 2014-08-04: qty 1

## 2014-08-04 MED ORDER — CARVEDILOL 25 MG PO TABS
25.0000 mg | ORAL_TABLET | Freq: Two times a day (BID) | ORAL | Status: DC
  Administered 2014-08-05: 25 mg via ORAL
  Filled 2014-08-04: qty 2
  Filled 2014-08-04 (×3): qty 1

## 2014-08-04 MED ORDER — METFORMIN HCL 500 MG PO TABS
1000.0000 mg | ORAL_TABLET | Freq: Two times a day (BID) | ORAL | Status: DC
  Administered 2014-08-05: 1000 mg via ORAL
  Filled 2014-08-04 (×2): qty 2

## 2014-08-04 NOTE — ED Provider Notes (Signed)
CSN: [id number]     Arrival date & time 08/04/14  1821 History   First MD Initiated Contact with Patient 08/04/14 2020     Chief Complaint  Patient presents with  . Numbness  . Aphasia      HPI Pt was seen at 2020. Per pt and her family, c/o gradual onset and resolution of one episode of "word finding" that occurred today approximately 1300. Pt's family states pt "was just fine" this morning, LSW approximately 1100. Pt states she was "having trouble finding words to say." Pt states she was trying to type in her computer password and "couldn't get the right word typed in." Pt states she took a nap, work up approximately 1730. States her symptoms continued until she arrived to the ED. Pt and her family both state pt's symptoms slowly improved and she is back to her baseline by arrival to the ED. Denies facial droop, no slurred speech, no dysphagia, no visual changes, no focal motor weakness, no tingling/numbness in extremities, no ataxia, no falls, no CP/SOB, no abd pain, no N/V/D.    Past Medical History  Diagnosis Date  . Long term (current) use of anticoagulants 11/02/2013    Pradaxa   . Atrial flutter 11/02/2013  . Essential hypertension, benign 11/02/2013  . Coronary artery disease   . Stroke   . Diabetes mellitus without complication   . Acute MI    Past Surgical History  Procedure Laterality Date  . Cardiac surgery    . Coronary artery bypass graft      History  Substance Use Topics  . Smoking status: Never Smoker   . Smokeless tobacco: Not on file  . Alcohol Use: No    Review of Systems ROS: Statement: All systems negative except as marked or noted in the HPI; Constitutional: Negative for fever and chills. ; ; Eyes: Negative for eye pain, redness and discharge. ; ; ENMT: Negative for ear pain, hoarseness, nasal congestion, sinus pressure and sore throat. ; ; Cardiovascular: Negative for chest pain, palpitations, diaphoresis, dyspnea and peripheral edema. ; ; Respiratory:  Negative for cough, wheezing and stridor. ; ; Gastrointestinal: Negative for nausea, vomiting, diarrhea, abdominal pain, blood in stool, hematemesis, jaundice and rectal bleeding. . ; ; Genitourinary: Negative for dysuria, flank pain and hematuria. ; ; Musculoskeletal: Negative for back pain and neck pain. Negative for swelling and trauma.; ; Skin: Negative for pruritus, rash, abrasions, blisters, bruising and skin lesion.; ; Neuro: +"word finding." Negative for headache, lightheadedness and neck stiffness. Negative for weakness, altered level of consciousness , altered mental status, extremity weakness, paresthesias, involuntary movement, seizure and syncope.      Allergies  Penicillins  Home Medications   Prior to Admission medications   Medication Sig Start Date End Date Taking? Authorizing Provider  calcium carbonate (TUMS EX) 750 MG chewable tablet Chew 1 tablet by mouth daily.   Yes Historical Provider, MD  carvedilol (COREG) 25 MG tablet Take 25 mg by mouth 2 (two) times daily with a meal.   Yes Historical Provider, MD  Cholecalciferol (VITAMIN D) 1000 UNITS capsule Take 1,000 Units by mouth 2 (two) times daily.   Yes Historical Provider, MD  clonazePAM (KLONOPIN) 0.5 MG tablet Take 0.25 mg by mouth at bedtime as needed (for sleep).  06/16/12  Yes Historical Provider, MD  Cyanocobalamin (VITAMIN B 12 PO) Inject 1,000 mcg as directed every 30 (thirty) days.   Yes Historical Provider, MD  docusate sodium (COLACE) 100 MG capsule Take 100 mg  by mouth daily as needed for mild constipation.   Yes Historical Provider, MD  ENBREL 50 MG/ML injection Inject 50 mg as directed every Tuesday.  07/17/12  Yes Historical Provider, MD  GLIPIZIDE XL 5 MG 24 hr tablet Take 5 mg by mouth 2 (two) times daily.  07/07/12  Yes Historical Provider, MD  LANTUS 100 UNIT/ML injection Inject 40 Units into the skin at bedtime.  07/07/12  Yes Historical Provider, MD  metFORMIN (GLUCOPHAGE) 500 MG tablet Take 1,000 mg by mouth  2 (two) times daily. 07/13/14  Yes Historical Provider, MD  Multiple Vitamins-Minerals (MULTIVITAMIN PO) Take 1 tablet by mouth daily.    Yes Historical Provider, MD  PRADAXA 150 MG CAPS Take 150 mg by mouth 2 (two) times daily.  07/18/12  Yes Historical Provider, MD  ramipril (ALTACE) 5 MG capsule Take 5 mg by mouth 2 (two) times daily.   Yes Historical Provider, MD  rosuvastatin (CRESTOR) 10 MG tablet Take 10 mg by mouth at bedtime.    Yes Historical Provider, MD  nitroGLYCERIN (NITROSTAT) 0.4 MG SL tablet Place 1 tablet (0.4 mg total) under the tongue as needed for chest pain. 03/14/14   Belva Crome III, MD   BP 122/47  Pulse 84  Temp(Src) 98.2 F (36.8 C) (Oral)  Resp 17  SpO2 96% Physical Exam 2025: Physical examination:  Nursing notes reviewed; Vital signs and O2 SAT reviewed;  Constitutional: Well developed, Well nourished, Well hydrated, In no acute distress; Head:  Normocephalic, atraumatic; Eyes: EOMI, PERRL, No scleral icterus; ENMT: Mouth and pharynx normal, Mucous membranes moist; Neck: Supple, Full range of motion, No lymphadenopathy; Cardiovascular: Regular rate and rhythm, No gallop; Respiratory: Breath sounds clear & equal bilaterally, No rales, rhonchi, wheezes.  Speaking full sentences with ease, Normal respiratory effort/excursion; Chest: Nontender, Movement normal; Abdomen: Soft, Nontender, Nondistended, Normal bowel sounds; Genitourinary: No CVA tenderness; Extremities: Pulses normal, No tenderness, No edema, No calf edema or asymmetry.; Neuro: AA&Ox3, Major CN grossly intact.Speech clear.  No facial droop.  No nystagmus. Grips equal. Strength 5/5 equal bilat UE's and LE's.  DTR 2/4 equal bilat UE's and LE's.  No gross sensory deficits.  Normal cerebellar testing bilat UE's (finger-nose) and LE's (heel-shin)..; Skin: Color normal, Warm, Dry.   ED Course  Procedures     EKG Interpretation   Date/Time:  Thursday August 04 2014 18:29:49 EDT Ventricular Rate:  80 PR  Interval:  166 QRS Duration: 80 QT Interval:  362 QTC Calculation: 417 R Axis:   -10 Text Interpretation:  Normal sinus rhythm Left axis deviation Inferior  infarct , age undetermined Anteroseptal infarct , age undetermined  Abnormal ECG When compared with ECG of 02/14/2005 No significant change was  found Confirmed by [facility]  MD, Nunzio Cory [phone]) on 08/04/2014 9:13:57 PM      MDM  MDM Reviewed: previous chart, nursing note and vitals Reviewed previous: labs and ECG Interpretation: labs, ECG, x-ray and CT scan   Results for orders placed during the hospital encounter of 08/04/14  PROTIME-INR      Result Value Ref Range   Prothrombin Time 18.1 (*) 11.6 - 15.2 seconds   INR 1.50 (*) 0.00 - 1.49  APTT      Result Value Ref Range   aPTT 47 (*) 24 - 37 seconds  CBC      Result Value Ref Range   WBC 7.3  4.0 - 10.5 K/uL   RBC 4.35  3.87 - 5.11 MIL/uL   Hemoglobin 13.2  12.0 - 15.0 g/dL   HCT 39.5  36.0 - 46.0 %   MCV 90.8  78.0 - 100.0 fL   MCH 30.3  26.0 - 34.0 pg   MCHC 33.4  30.0 - 36.0 g/dL   RDW 12.6  11.5 - 15.5 %   Platelets 180  150 - 400 K/uL  DIFFERENTIAL      Result Value Ref Range   Neutrophils Relative % 48  43 - 77 %   Neutro Abs 3.5  1.7 - 7.7 K/uL   Lymphocytes Relative 43  12 - 46 %   Lymphs Abs 3.2  0.7 - 4.0 K/uL   Monocytes Relative 7  3 - 12 %   Monocytes Absolute 0.5  0.1 - 1.0 K/uL   Eosinophils Relative 2  0 - 5 %   Eosinophils Absolute 0.2  0.0 - 0.7 K/uL   Basophils Relative 0  0 - 1 %   Basophils Absolute 0.0  0.0 - 0.1 K/uL  COMPREHENSIVE METABOLIC PANEL      Result Value Ref Range   Sodium 141  137 - 147 mEq/L   Potassium 4.4  3.7 - 5.3 mEq/L   Chloride 103  96 - 112 mEq/L   CO2 25  19 - 32 mEq/L   Glucose, Bld 216 (*) 70 - 99 mg/dL   BUN 17  6 - 23 mg/dL   Creatinine, Ser 0.67  0.50 - 1.10 mg/dL   Calcium 9.2  8.4 - 10.5 mg/dL   Total Protein 6.6  6.0 - 8.3 g/dL   Albumin 3.9  3.5 - 5.2 g/dL   AST 17  0 - 37 U/L   ALT 15  0 - 35  U/L   Alkaline Phosphatase 57  39 - 117 U/L   Total Bilirubin 0.3  0.3 - 1.2 mg/dL   GFR calc non Af Amer 82 (*) >90 mL/min   GFR calc Af Amer >90  >90 mL/min   Anion gap 13  5 - 15  I-STAT CHEM 8, ED      Result Value Ref Range   Sodium 139  137 - 147 mEq/L   Potassium 4.2  3.7 - 5.3 mEq/L   Chloride 101  96 - 112 mEq/L   BUN 18  6 - 23 mg/dL   Creatinine, Ser 0.80  0.50 - 1.10 mg/dL   Glucose, Bld 227 (*) 70 - 99 mg/dL   Calcium, Ion 1.21  1.13 - 1.30 mmol/L   TCO2 24  0 - 100 mmol/L   Hemoglobin 13.3  12.0 - 15.0 g/dL   HCT 39.0  36.0 - 46.0 %  I-STAT TROPOININ, ED      Result Value Ref Range   Troponin i, poc 0.00  0.00 - 0.08 ng/mL   Comment 3           CBG MONITORING, ED      Result Value Ref Range   Glucose-Capillary 225 (*) 70 - 99 mg/dL   Ct Head (brain) Wo Contrast 08/04/2014   CLINICAL DATA:  Numbness.  Right-sided headache.  EXAM: CT HEAD WITHOUT CONTRAST  TECHNIQUE: Contiguous axial images were obtained from the base of the skull through the vertex without intravenous contrast.  COMPARISON:  02/06/2011.  FINDINGS: Skull and Sinuses:Negative for fracture or destructive process. The mastoids, middle ears, and imaged paranasal sinuses are clear.  Orbits: No acute abnormality.  Brain: No evidence of acute abnormality, such as acute infarction, hemorrhage, hydrocephalus, or mass lesion/mass effect. Generalized  cerebral volume loss which is age appropriate and unchanged from 2012. Stable pattern of chronic small vessel disease with ischemic gliosis around the lateral ventricles. Remote small vessel infarct in the upper left cerebellum.  IMPRESSION: 1. No acute intracranial findings. 2. Chronic small vessel disease.   Electronically Signed   By: Jorje Guild M.D.   On: 08/04/2014 21:05    2140:  Neuro exam continues intact and unchanged. Family states pt is acting per her baseline. Dx and testing d/w pt and family.  Questions answered.  Verb understanding, agreeable to admit.   T/C to [facility] Dr. Alcario Drought, case discussed, including:  HPI, pertinent PM/SHx, VS/PE, dx testing, ED course and treatment:  Agreeable to admit, requests to write temporary orders, obtain observation tele bed to team 10.     Francine Graven, DO 08/06/14 [phone]

## 2014-08-04 NOTE — ED Notes (Signed)
Checked patient blood sugat it was 225 notified RN of blood sugar

## 2014-08-04 NOTE — H&P (Signed)
[facility] History and Physical  [name]:[id number] DOB: 1934-08-29 DOA: 08/04/2014  Referring physician: EDP PCP: Ginette Otto, MD   Chief Complaint: Aphasia   HPI: [name] is a 80 y.o. female who developed headache, R sided head numbness, and difficulty "with word finding" at 1pm today.  LSN at 11 AM.  She notes that she couldn't type in the password correctly for her computer "for some reason".  Daughter who evaluated patient noted that the patient was "unable to correctly read off numbers that were written down".  No unilateral weakness, extremity weakness, facial droop, difficulty walking or other focal neurologic deficit noted.  By the time of evaluation in ED she is back to baseline per family members and her.  Review of Systems: Systems reviewed.  As above, otherwise negative  Past Medical History  Diagnosis Date  . Long term (current) use of anticoagulants 11/02/2013    Pradaxa   . Atrial flutter 11/02/2013  . Essential hypertension, benign 11/02/2013  . Coronary artery disease   . Stroke   . Diabetes mellitus without complication   . Acute MI    Past Surgical History  Procedure Laterality Date  . Cardiac surgery    . Coronary artery bypass graft     Social History:  reports that she has never smoked. She does not have any smokeless tobacco history on file. She reports that she does not drink alcohol or use illicit drugs.  Allergies  Allergen Reactions  . Penicillins Swelling and Other (See Comments)    Swelling of tongue    No family history on file.   Prior to Admission medications   Medication Sig Start Date End Date Taking? Authorizing Provider  calcium carbonate (TUMS EX) 750 MG chewable tablet Chew 1 tablet by mouth daily.   Yes Historical Provider, MD  carvedilol (COREG) 25 MG tablet Take 25 mg by mouth 2 (two) times daily with a meal.   Yes Historical Provider, MD  Cholecalciferol (VITAMIN D) 1000 UNITS capsule Take 1,000  Units by mouth 2 (two) times daily.   Yes Historical Provider, MD  clonazePAM (KLONOPIN) 0.5 MG tablet Take 0.25 mg by mouth at bedtime as needed (for sleep).  06/16/12  Yes Historical Provider, MD  Cyanocobalamin (VITAMIN B 12 PO) Inject 1,000 mcg as directed every 30 (thirty) days.   Yes Historical Provider, MD  docusate sodium (COLACE) 100 MG capsule Take 100 mg by mouth daily as needed for mild constipation.   Yes Historical Provider, MD  ENBREL 50 MG/ML injection Inject 50 mg as directed every Tuesday.  07/17/12  Yes Historical Provider, MD  GLIPIZIDE XL 5 MG 24 hr tablet Take 5 mg by mouth 2 (two) times daily.  07/07/12  Yes Historical Provider, MD  LANTUS 100 UNIT/ML injection Inject 40 Units into the skin at bedtime.  07/07/12  Yes Historical Provider, MD  metFORMIN (GLUCOPHAGE) 500 MG tablet Take 1,000 mg by mouth 2 (two) times daily. 07/13/14  Yes Historical Provider, MD  Multiple Vitamins-Minerals (MULTIVITAMIN PO) Take 1 tablet by mouth daily.    Yes Historical Provider, MD  PRADAXA 150 MG CAPS Take 150 mg by mouth 2 (two) times daily.  07/18/12  Yes Historical Provider, MD  ramipril (ALTACE) 5 MG capsule Take 5 mg by mouth 2 (two) times daily.   Yes Historical Provider, MD  rosuvastatin (CRESTOR) 10 MG tablet Take 10 mg by mouth at bedtime.    Yes Historical Provider, MD  nitroGLYCERIN (NITROSTAT) 0.4 MG  SL tablet Place 1 tablet (0.4 mg total) under the tongue as needed for chest pain. 03/14/14   Lesleigh Noe, MD   Physical Exam: Filed Vitals:   08/04/14 2215  BP: 149/59  Pulse: 84  Temp:   Resp: 27    BP 149/59  Pulse 84  Temp(Src) 98.2 F (36.8 C) (Oral)  Resp 27  SpO2 97%  General Appearance:    Alert, oriented, no distress, appears stated age  Head:    Normocephalic, atraumatic  Eyes:    PERRL, EOMI, sclera non-icteric        Nose:   Nares without drainage or epistaxis. Mucosa, turbinates normal  Throat:   Moist mucous membranes. Oropharynx without erythema or exudate.   Neck:   Supple. No carotid bruits.  No thyromegaly.  No lymphadenopathy.   Back:     No CVA tenderness, no spinal tenderness  Lungs:     Clear to auscultation bilaterally, without wheezes, rhonchi or rales  Chest wall:    No tenderness to palpitation  Heart:    Regular rate and rhythm without murmurs, gallops, rubs  Abdomen:     Soft, non-tender, nondistended, normal bowel sounds, no organomegaly  Genitalia:    deferred  Rectal:    deferred  Extremities:   No clubbing, cyanosis or edema.  Pulses:   2+ and symmetric all extremities  Skin:   Skin color, texture, turgor normal, no rashes or lesions  Lymph nodes:   Cervical, supraclavicular, and axillary nodes normal  Neurologic:   CNII-XII intact. Normal strength, sensation and reflexes      throughout    Labs on Admission:  Basic Metabolic Panel:  Recent Labs Lab 08/04/14 1829 08/04/14 1848  NA 141 139  K 4.4 4.2  CL 103 101  CO2 25  --   GLUCOSE 216* 227*  BUN 17 18  CREATININE 0.67 0.80  CALCIUM 9.2  --    Liver Function Tests:  Recent Labs Lab 08/04/14 1829  AST 17  ALT 15  ALKPHOS 57  BILITOT 0.3  PROT 6.6  ALBUMIN 3.9   No results found for this basename: LIPASE, AMYLASE,  in the last 168 hours No results found for this basename: AMMONIA,  in the last 168 hours CBC:  Recent Labs Lab 08/04/14 1829 08/04/14 1848  WBC 7.3  --   NEUTROABS 3.5  --   HGB 13.2 13.3  HCT 39.5 39.0  MCV 90.8  --   PLT 180  --    Cardiac Enzymes: No results found for this basename: CKTOTAL, CKMB, CKMBINDEX, TROPONINI,  in the last 168 hours  BNP (last 3 results) No results found for this basename: PROBNP,  in the last 8760 hours CBG:  Recent Labs Lab 08/04/14 1853  GLUCAP 225*    Radiological Exams on Admission: Ct Head (brain) Wo Contrast  08/04/2014   CLINICAL DATA:  Numbness.  Right-sided headache.  EXAM: CT HEAD WITHOUT CONTRAST  TECHNIQUE: Contiguous axial images were obtained from the base of the skull through  the vertex without intravenous contrast.  COMPARISON:  02/06/2011.  FINDINGS: Skull and Sinuses:Negative for fracture or destructive process. The mastoids, middle ears, and imaged paranasal sinuses are clear.  Orbits: No acute abnormality.  Brain: No evidence of acute abnormality, such as acute infarction, hemorrhage, hydrocephalus, or mass lesion/mass effect. Generalized cerebral volume loss which is age appropriate and unchanged from 2012. Stable pattern of chronic small vessel disease with ischemic gliosis around the lateral ventricles. Remote  small vessel infarct in the upper left cerebellum.  IMPRESSION: 1. No acute intracranial findings. 2. Chronic small vessel disease.   Electronically Signed   By: Tiburcio Pea M.D.   On: 08/04/2014 21:05    EKG: Independently reviewed.  Assessment/Plan Principal Problem:   TIA (transient ischemic attack) Active Problems:   Atrial flutter   Essential hypertension, benign   Long term (current) use of anticoagulants   1. TIA - given the history of A.Flutter, patient felt to be at elevated risk for embolic ischemic events, therefore high degree of suspicion that today's event represents a TIA till proven otherwise. 1. Patient unfortunately refuses MRI at this time on grounds of claustrophobia, even with light sedation offered, and understandably doesn't want general anesthesia either for this.  She requests that open MRI be used as an alternative.  Will therefore hold off on ordering until this can be discussed further with patient and family in AM. 2. 2d echo ordered 3. Carotid doppler ordered 4. Neurology seeing patient now. 5. Anticoagulant - pradaxa for now 6. Will have PT/OT/SLP evaluate as well. 2. A.Flutter - tele monitor, continue pradaxa for now 3. HTN - continue home meds 4. DM2 - continue home meds, CBG checks AC/HS    Code Status: Full Code  Family Communication: Daughter at bedside, Son is an Best boy with [employer] Disposition Plan: Admit  to obs   Time spent: 70 min  Shiquan Mathieu M. [facility] Pager [phone]  If 7AM-7PM, please contact the day team taking care of the patient [email or web address] Password [facility] 08/04/2014, 10:47 PM

## 2014-08-04 NOTE — Consult Note (Signed)
Referring Physician: Sheran Luz    Chief Complaint: Transient right facial numbness and speech difficulty.  HPI: [name] is an 80 y.o. female history of atrial flutter on Pradaxa, previous stroke, myocardial infarction, hypertension, hyperlipidemia and diabetes mellitus, presenting with speech output difficulty. Patient had difficulty formulating words. As well she had problems with not being able to type password to walk into her computer. Symptom onset was at 1 PM this afternoon. She was last known well per family at about 11 AM. She had mild numbness involving the upper right face at the onset of her symptoms. Symptoms completely resolved shortly after arriving in the emergency room. CT scan of the head showed no acute intracranial abnormality. NIH stroke score was 0 at the time of this evaluation.  LSN: 11 AM on 08/04/2014 tPA Given: No: Deficits resolved; patient is on anticoagulation with Pradaxa. MRankin: 0  Past Medical History  Diagnosis Date  . Long term (current) use of anticoagulants 11/02/2013    Pradaxa   . Atrial flutter 11/02/2013  . Essential hypertension, benign 11/02/2013  . Coronary artery disease   . Stroke   . Diabetes mellitus without complication   . Acute MI     No family history on file.   Medications: I have reviewed the patient's current medications.  ROS: History obtained from the patient  General ROS: negative for - chills, fatigue, fever, night sweats, weight gain or weight loss Psychological ROS: negative for - behavioral disorder, hallucinations, memory difficulties, mood swings or suicidal ideation Ophthalmic ROS: negative for - blurry vision, double vision, eye pain or loss of vision ENT ROS: negative for - epistaxis, nasal discharge, oral lesions, sore throat, tinnitus or vertigo Allergy and Immunology ROS: negative for - hives or itchy/watery eyes Hematological and Lymphatic ROS: negative for - bleeding problems, bruising or swollen lymph  nodes Endocrine ROS: negative for - galactorrhea, hair pattern changes, polydipsia/polyuria or temperature intolerance Respiratory ROS: negative for - cough, hemoptysis, shortness of breath or wheezing Cardiovascular ROS: negative for - chest pain, dyspnea on exertion, edema or irregular heartbeat Gastrointestinal ROS: negative for - abdominal pain, diarrhea, hematemesis, nausea/vomiting or stool incontinence Genito-Urinary ROS: negative for - dysuria, hematuria, incontinence or urinary frequency/urgency Musculoskeletal ROS: negative for - joint swelling or muscular weakness Neurological ROS: as noted in HPI Dermatological ROS: negative for rash and skin lesion changes  Physical Examination: Blood pressure 149/59, pulse 84, temperature 98.2 F (36.8 C), temperature source Oral, resp. rate 27, SpO2 97.00%.  Neurologic Examination: Mental Status: Alert, oriented, thought content appropriate.  Speech fluent without evidence of aphasia. Able to follow commands without difficulty. Cranial Nerves: II-Visual fields were normal. III/IV/VI-Pupils were equal and reacted. Extraocular movements were full and conjugate.    V/VII-no facial numbness and no facial weakness. VIII-normal. X-normal speech and symmetrical palatal movement. Motor: 5/5 bilaterally with normal tone and bulk Sensory: Normal throughout, including vibratory sensation distally in both lower extremities. Deep Tendon Reflexes: Trace to 1+ in upper extremities and right knee; absent left knee jerk and ankle jerks. Plantars: Flexor bilaterally Cerebellar: Normal finger-to-nose testing. Carotid auscultation: Normal  Ct Head (brain) Wo Contrast  08/04/2014   CLINICAL DATA:  Numbness.  Right-sided headache.  EXAM: CT HEAD WITHOUT CONTRAST  TECHNIQUE: Contiguous axial images were obtained from the base of the skull through the vertex without intravenous contrast.  COMPARISON:  02/06/2011.  FINDINGS: Skull and Sinuses:Negative for  fracture or destructive process. The mastoids, middle ears, and imaged paranasal sinuses are clear.  Orbits: No acute abnormality.  Brain: No evidence of acute abnormality, such as acute infarction, hemorrhage, hydrocephalus, or mass lesion/mass effect. Generalized cerebral volume loss which is age appropriate and unchanged from 2012. Stable pattern of chronic small vessel disease with ischemic gliosis around the lateral ventricles. Remote small vessel infarct in the upper left cerebellum.  IMPRESSION: 1. No acute intracranial findings. 2. Chronic small vessel disease.   Electronically Signed   By: Jorje Guild M.D.   On: 08/04/2014 21:05    Assessment: 80 y.o. female with multiple risk factors for stroke as well as history of previous stroke, presenting with probable left MCA territory TIA. Recurrent acute stroke cannot be ruled out at this point, however.  Stroke Risk Factors - atrial fibrillation, diabetes mellitus, hyperlipidemia and hypertension  Plan: 1. HgbA1c, fasting lipid panel 2. MRI, MRA  of the brain without contrast, if possible, as patient is extremely claustrophobic 3. Speech consult 4. Echocardiogram 5. Carotid dopplers, or CTA of head and neck with contrast media, if MRI/MRA is not feasible 6. Prophylactic therapy-Anticoagulation: Pradaxa 7. Risk factor modification 8. Telemetry monitoring   C.R. Nicole Kindred, MD [facility] [phone]  08/04/2014, 11:10 PM

## 2014-08-04 NOTE — ED Notes (Signed)
MD at bedside. 

## 2014-08-04 NOTE — ED Notes (Signed)
Patient transported to CT 

## 2014-08-04 NOTE — ED Notes (Signed)
Pt c/o right sided head numbness that started at 1pm. Pt also having difficulty finding words. LSN 11AM. Denies HA/slurred speech/difficulty walking/one sided weakness. Nad, skin warm and dry, resp e/u.

## 2014-08-05 ENCOUNTER — Observation Stay (HOSPITAL_COMMUNITY): Payer: Medicare Other

## 2014-08-05 DIAGNOSIS — R51 Headache: Secondary | ICD-10-CM | POA: Diagnosis not present

## 2014-08-05 DIAGNOSIS — G43109 Migraine with aura, not intractable, without status migrainosus: Secondary | ICD-10-CM | POA: Diagnosis present

## 2014-08-05 DIAGNOSIS — G459 Transient cerebral ischemic attack, unspecified: Secondary | ICD-10-CM | POA: Diagnosis not present

## 2014-08-05 DIAGNOSIS — E1059 Type 1 diabetes mellitus with other circulatory complications: Secondary | ICD-10-CM

## 2014-08-05 DIAGNOSIS — I517 Cardiomegaly: Secondary | ICD-10-CM | POA: Diagnosis not present

## 2014-08-05 DIAGNOSIS — I4892 Unspecified atrial flutter: Secondary | ICD-10-CM | POA: Diagnosis not present

## 2014-08-05 DIAGNOSIS — I10 Essential (primary) hypertension: Secondary | ICD-10-CM | POA: Diagnosis not present

## 2014-08-05 LAB — LIPID PANEL
Cholesterol: 137 mg/dL (ref 0–200)
HDL: 65 mg/dL (ref >39)
LDL Cholesterol: 51 mg/dL (ref 0–99)
TRIGLYCERIDES: 105 mg/dL (ref <150)
Total CHOL/HDL Ratio: 2.1 RATIO
VLDL: 21 mg/dL (ref 0–40)

## 2014-08-05 LAB — URINALYSIS, ROUTINE W REFLEX MICROSCOPIC
BILIRUBIN URINE: NEGATIVE
Glucose, UA: NEGATIVE mg/dL
HGB URINE DIPSTICK: NEGATIVE
Ketones, ur: 15 mg/dL — AB
Leukocytes, UA: NEGATIVE
NITRITE: NEGATIVE
PROTEIN: NEGATIVE mg/dL
SPECIFIC GRAVITY, URINE: 1.019 (ref 1.005–1.030)
UROBILINOGEN UA: 1 mg/dL (ref 0.0–1.0)
pH: 5.5 (ref 5.0–8.0)

## 2014-08-05 LAB — HEMOGLOBIN A1C
HEMOGLOBIN A1C: 8.3 % — AB (ref <5.7)
Hgb A1c MFr Bld: 8.4 % — ABNORMAL HIGH (ref <5.7)
MEAN PLASMA GLUCOSE: 192 mg/dL — AB (ref <117)
Mean Plasma Glucose: 194 mg/dL — ABNORMAL HIGH (ref <117)

## 2014-08-05 LAB — GLUCOSE, CAPILLARY
GLUCOSE-CAPILLARY: 235 mg/dL — AB (ref 70–99)
Glucose-Capillary: 154 mg/dL — ABNORMAL HIGH (ref 70–99)

## 2014-08-05 MED ORDER — ONDANSETRON HCL 4 MG/2ML IJ SOLN
8.0000 mg | Freq: Four times a day (QID) | INTRAMUSCULAR | Status: DC | PRN
Start: 2014-08-05 — End: 2014-08-05

## 2014-08-05 MED ORDER — ONDANSETRON HCL 4 MG/2ML IJ SOLN: 8.0000 mg | Freq: Four times a day (QID) | INTRAMUSCULAR | Status: DC | PRN

## 2014-08-05 MED ORDER — GADOBENATE DIMEGLUMINE 529 MG/ML IV SOLN
20.0000 mL | Freq: Once | INTRAVENOUS | Status: AC | PRN
  Administered 2014-08-05: 17 mL via INTRAVENOUS

## 2014-08-05 MED ORDER — INSULIN ASPART 100 UNIT/ML ~~LOC~~ SOLN: 0.0000 [IU] | Freq: Three times a day (TID) | SUBCUTANEOUS | Status: DC

## 2014-08-05 MED ORDER — INSULIN GLARGINE 100 UNIT/ML ~~LOC~~ SOLN
50.0000 [IU] | Freq: Every day | SUBCUTANEOUS | Status: DC
  Filled 2014-08-05: qty 0.5

## 2014-08-05 MED ORDER — GLIPIZIDE ER 5 MG PO TB24: 10.0000 mg | ORAL_TABLET | Freq: Every day | ORAL | Status: DC

## 2014-08-05 MED ORDER — ROSUVASTATIN CALCIUM 10 MG PO TABS
10.0000 mg | ORAL_TABLET | Freq: Every day | ORAL | Status: DC
  Filled 2014-08-05: qty 1

## 2014-08-05 MED ORDER — LORAZEPAM 2 MG/ML IJ SOLN: 1.0000 mg | Freq: Once | INTRAMUSCULAR | Status: DC

## 2014-08-05 MED ORDER — ACETAMINOPHEN 325 MG PO TABS
650.0000 mg | ORAL_TABLET | Freq: Four times a day (QID) | ORAL | Status: DC | PRN
  Administered 2014-08-05: 650 mg via ORAL
  Filled 2014-08-05: qty 2

## 2014-08-05 MED ORDER — INSULIN GLARGINE 100 UNIT/ML ~~LOC~~ SOLN: 50.0000 [IU] | Freq: Every day | SUBCUTANEOUS | Status: DC

## 2014-08-05 MED ORDER — DABIGATRAN ETEXILATE MESYLATE 150 MG PO CAPS
150.0000 mg | ORAL_CAPSULE | Freq: Two times a day (BID) | ORAL | Status: DC
  Filled 2014-08-05: qty 1

## 2014-08-05 MED ORDER — INSULIN ASPART 100 UNIT/ML ~~LOC~~ SOLN: 0.0000 [IU] | Freq: Every day | SUBCUTANEOUS | Status: DC

## 2014-08-05 MED ORDER — INSULIN ASPART 100 UNIT/ML ~~LOC~~ SOLN: 4.0000 [IU] | Freq: Three times a day (TID) | SUBCUTANEOUS | Status: DC

## 2014-08-05 NOTE — Progress Notes (Signed)
Physical Therapy Treatment Patient Details Name: [name] MRN: [id number] DOB: [birth year] Today's Date: 08/05/2014    History of Present Illness 80 y.o. female history of atrial flutter on Pradaxa, previous stroke, myocardial infarction, hypertension, hyperlipidemia and diabetes mellitus, presenting with speech output difficulty. Patient had difficulty formulating words. Pending MRI for stroke workup.     PT Comments    Pt adm due to above. No focal weakness or deficits noted during evaluation. Pt also independent in bathroom for ADls during session. Appears to be at baseline. Educated on signs and symptoms of stroke for early detection. Will sign off at this time. No further acute PT needs warranted.   Follow Up Recommendations  No PT follow up;Supervision - Intermittent     Equipment Recommendations  None recommended by PT    Recommendations for Other Services       Precautions / Restrictions Precautions Precautions: None Restrictions Weight Bearing Restrictions: No    Mobility  Bed Mobility Overal bed mobility: Modified Independent             General bed mobility comments: incr time  Transfers Overall transfer level: Modified independent Equipment used: None             General transfer comment: able to transfer from bed and toilet without difficulty or LOB  Ambulation/Gait Ambulation/Gait assistance: Modified independent (Device/Increase time) Ambulation Distance (Feet): 300 Feet (150 x 2 ) Assistive device: None Gait Pattern/deviations: Step-through pattern;Wide base of support Gait velocity: decreased; "baseline" per pt   General Gait Details: pt ambulated to gym; required rest break due to fatigue; reports she does not walk that much at one time; no LOB noted; challenged with high level balance activities    Stairs Stairs: Yes Stairs assistance: Modified independent (Device/Increase time) Stair Management: One rail Right;Alternating  pattern;Forwards Number of Stairs: 5 General stair comments: good ability to navigate steps; no LOB noted  Wheelchair Mobility    Modified Rankin (Stroke Patients Only) Modified Rankin (Stroke Patients Only) Pre-Morbid Rankin Score: Slight disability Modified Rankin: Slight disability     Balance Overall balance assessment: Modified Independent                           High level balance activites: Direction changes;Turns;Sudden stops;Head turns High Level Balance Comments: no LOB noted with balance activities    Cognition Arousal/Alertness: Awake/alert Behavior During Therapy: WFL for tasks assessed/performed Overall Cognitive Status: Within Functional Limits for tasks assessed                      Exercises      General Comments General comments (skin integrity, edema, etc.): pt able to perform ADls at sink and for pericare in bathroom without LOB ; educated on signs and symptoms of stroke for early detection      Pertinent Vitals/Pain Pain Assessment: No/denies pain    Home Living Family/patient expects to be discharged to:: Private residence Living Arrangements: Spouse/significant other Available Help at Discharge: Family;Available PRN/intermittently Type of Home: House Home Access: Stairs to enter Entrance Stairs-Rails: Can reach both;Left;Right Home Layout: Multi-level Home Equipment: Other (comment);Grab bars - toilet;Shower seat;Bedside commode;Walker - 2 wheels (chair lift for steps ) Additional Comments: pt lives with husband; husband however is dependent; they have housekeeper and homestead at night for husband care     Prior Function Level of Independence: Independent      Comments: pt drives intermittently; has family available PRN  PT Goals (current goals can now be found in the care plan section) Acute Rehab PT Goals Patient Stated Goal: home today PT Goal Formulation: No goals set, d/c therapy    Frequency       PT Plan       Co-evaluation             End of Session Equipment Utilized During Treatment: Gait belt Activity Tolerance: Patient tolerated treatment well Patient left: in bed;with call bell/phone within reach;with bed alarm set     Time: 1610-9604 PT Time Calculation (min): 27 min  Charges:  $Gait Training: 8-22 mins $Therapeutic Activity: 8-22 mins                    G Codes:  Functional Assessment Tool Used: clinical judgement  Functional Limitation: Mobility: Walking and moving around Mobility: Walking and Moving Around Current Status [phone]): 0 percent impaired, limited or restricted Mobility: Walking and Moving Around Goal Status [phone]): 0 percent impaired, limited or restricted Mobility: Walking and Moving Around Discharge Status [phone]): 0 percent impaired, limited or restricted   Donell Sievert, Britt  [phone] 08/05/2014, 11:07 AM

## 2014-08-05 NOTE — Progress Notes (Signed)
Inpatient Diabetes Program Recommendations  AACE/ADA: New Consensus Statement on Inpatient Glycemic Control (2013)  Target Ranges:  Prepandial:   less than 140 mg/dL      Peak postprandial:   less than 180 mg/dL (1-2 hours)      Critically ill patients:  140 - 180 mg/dL   Results for OZZIE, REMMERS (MRN [id number]) as of 08/05/2014 12:50  Ref. Range 08/04/2014 18:53 08/04/2014 23:45 08/05/2014 12:39  Glucose-Capillary Latest Range: 70-99 mg/dL 225 (H) 255 (H) 235 (H)   Reason for Assessment: elevated BG  Diabetes history: Type 2 Outpatient Diabetes medications: Glipizide 5mg  bid, Lantus 40 units Qhs, Metformin 1000mg  bid Current orders for Inpatient glycemic control: Glipizide 5mg  bid, Lantus 40 units Qhs, Metformin 1000mg  bid    Based on current CBG please consider adding Novolog moderate correction ac meals and hs.   Gentry Fitz, RN, BA, MHA, CDE Diabetes Coordinator Inpatient Diabetes Program  [phone] (Team Pager) [phone] [facility]) 08/05/2014 1:11 PM

## 2014-08-05 NOTE — Progress Notes (Signed)
  Echocardiogram 2D Echocardiogram has been performed.  [name] 08/05/2014, 12:06 PM

## 2014-08-05 NOTE — Progress Notes (Signed)
[facility] PROGRESS NOTE  Assessment/Plan: TIA (transient ischemic attack) - HgbA1c pending, fasting lipid panel HDL > 60, LDL < 100. - MRI, MRA of the brain without contrast pending, ct head no bleed. - PT consult, OT consult, Speech consult pending - Carotid dopplers , Echocardiogram pending - Prophylactic therapy-Antiplatelet med: pradaxa - Avoid D5 fluids as may be harmfull - risk factor modification  - Cardiac Monitoring showed no events. - Neurochecks q4h   Atrial flutter - NSR, cont pradaxa.   HTN: - continue home meds   DM2: - continue home meds, CBG checks AC/HS  Code Status: Full Code  Family Communication: Daughter at bedside, Son is an Materials engineer with [employer]  Disposition Plan: inpatient     Consultants:  neuro  Procedures:  Echo  Carotid  MRI  Antibiotics:  None  HPI/Subjective: No compalins  Objective: Filed Vitals:   08/04/14 2323 08/05/14 0100 08/05/14 0300 08/05/14 0600  BP: 158/57 134/56 131/54 115/50  Pulse: 82 88 87 75  Temp: 97.9 F (36.6 C) 98.1 F (36.7 C) 97.3 F (36.3 C) 98.3 F (36.8 C)  TempSrc: Oral Oral Oral Oral  Resp: 18  19 17   Height: 5\' 2"  (1.575 m)     Weight: 82.5 kg (181 lb 14.1 oz)     SpO2: 97% 96% 97% 98%   No intake or output data in the 24 hours ending 08/05/14 0822 Filed Weights   08/04/14 2323  Weight: 82.5 kg (181 lb 14.1 oz)    Exam:  General: Alert, awake, oriented x3. HEENT: No bruits, no goiter.  Heart: Regular rate and rhythm.  Lungs: Good air movement, clear Abdomen: Soft, nontender, nondistended, positive bowel sounds.  Neuro: Grossly intact, nonfocal.   Data Reviewed: Basic Metabolic Panel:  Recent Labs Lab 08/04/14 1829 08/04/14 1848  NA 141 139  K 4.4 4.2  CL 103 101  CO2 25  --   GLUCOSE 216* 227*  BUN 17 18  CREATININE 0.67 0.80  CALCIUM 9.2  --    Liver Function Tests:  Recent Labs Lab 08/04/14 1829  AST 17  ALT 15  ALKPHOS 57  BILITOT 0.3  PROT  6.6  ALBUMIN 3.9   No results found for this basename: LIPASE, AMYLASE,  in the last 168 hours No results found for this basename: AMMONIA,  in the last 168 hours CBC:  Recent Labs Lab 08/04/14 1829 08/04/14 1848  WBC 7.3  --   NEUTROABS 3.5  --   HGB 13.2 13.3  HCT 39.5 39.0  MCV 90.8  --   PLT 180  --    Cardiac Enzymes: No results found for this basename: CKTOTAL, CKMB, CKMBINDEX, TROPONINI,  in the last 168 hours BNP (last 3 results) No results found for this basename: PROBNP,  in the last 8760 hours CBG:  Recent Labs Lab 08/04/14 1853 08/04/14 2345  GLUCAP 225* 255*    No results found for this or any previous visit (from the past 240 hour(s)).   Studies: Ct Head (brain) Wo Contrast  08/04/2014   CLINICAL DATA:  Numbness.  Right-sided headache.  EXAM: CT HEAD WITHOUT CONTRAST  TECHNIQUE: Contiguous axial images were obtained from the base of the skull through the vertex without intravenous contrast.  COMPARISON:  02/06/2011.  FINDINGS: Skull and Sinuses:Negative for fracture or destructive process. The mastoids, middle ears, and imaged paranasal sinuses are clear.  Orbits: No acute abnormality.  Brain: No evidence of acute abnormality, such as acute infarction, hemorrhage, hydrocephalus, or  mass lesion/mass effect. Generalized cerebral volume loss which is age appropriate and unchanged from 2012. Stable pattern of chronic small vessel disease with ischemic gliosis around the lateral ventricles. Remote small vessel infarct in the upper left cerebellum.  IMPRESSION: 1. No acute intracranial findings. 2. Chronic small vessel disease.   Electronically Signed   By: Jorje Guild M.D.   On: 08/04/2014 21:05    Scheduled Meds: . calcium carbonate  400 mg of elemental calcium Oral Daily  . carvedilol  25 mg Oral BID WC  . cholecalciferol  1,000 Units Oral Daily  . dabigatran  150 mg Oral BID  . [START ON 08/09/2014] etanercept  50 mg Subcutaneous Q Tue  . glipiZIDE  5 mg Oral  BID WC  . insulin glargine  40 Units Subcutaneous QHS  . metFORMIN  1,000 mg Oral BID WC  . ramipril  5 mg Oral BID  . rosuvastatin  10 mg Oral q1800   Continuous Infusions:    Charlynne Cousins  [facility] Pager [phone]. If 8PM-8AM, please contact night-coverage at [email or web address], password [facility] 08/05/2014, 8:22 AM  LOS: 1 day      **Disclaimer: This note may have been dictated with voice recognition software. Similar sounding words can inadvertently be transcribed and this note may contain transcription errors which may not have been corrected upon publication of note.**

## 2014-08-05 NOTE — Progress Notes (Signed)
VASCULAR LAB PRELIMINARY  PRELIMINARY  PRELIMINARY  PRELIMINARY  Carotid duplex  completed.    Preliminary report:  Bilateral:  1-39% ICA stenosis.  Vertebral artery flow is antegrade.      Anureet Bruington, RVT 08/05/2014, 9:54 AM

## 2014-08-05 NOTE — Progress Notes (Addendum)
Stroke Team Progress Note  HISTORY [name] is an 80 y.o. female history of atrial flutter on Pradaxa, previous stroke, myocardial infarction, hypertension, hyperlipidemia and diabetes mellitus, presenting with speech output difficulty. Patient had difficulty formulating words. As well she had problems with not being able to type password to walk into her computer. Symptom onset was at 1 PM this afternoon 08/04/2014. She was last known well per family at about 11 AM. She had mild numbness involving the upper right face at the onset of her symptoms. Symptoms completely resolved shortly after arriving in the emergency room. CT scan of the head showed no acute intracranial abnormality. NIH stroke score was 0 at the time of this evaluation. Patient was not administered TPA secondary to deficits resolved; on anticoagulation with Pradaxa. She was admitted for further evaluation and treatment.  SUBJECTIVE She stated that she had hx of migraine and last one was several years ago. For the migraine HA, she will have photophobia, visual changes with wavy lines. She started to have HA yesterday afternoon, right frontal but not as bad as previous migraine but she had same wavy lines as usual. At the meantime, she had episodes of not able to remember password, which she should remember for sure. She had difficulty to remember what she was doing. She also has some right face numbness. Episodes resolved last night along with resolution of HA. In ER, BP 140/65. She had Afib /flutter and on pradaxa. She took it faithfully.   OBJECTIVE Most recent Vital Signs: Filed Vitals:   08/05/14 0100 08/05/14 0300 08/05/14 0600 08/05/14 1426  BP: 134/56 131/54 115/50 147/57  Pulse: 88 87 75 77  Temp: 98.1 F (36.7 C) 97.3 F (36.3 C) 98.3 F (36.8 C) 98 F (36.7 C)  TempSrc: Oral Oral Oral Oral  Resp:  19 17 18   Height:      Weight:      SpO2: 96% 97% 98% 95%   CBG (last 3)   Recent Labs  08/04/14 2345  08/05/14 1239 08/05/14 1706  GLUCAP 255* 235* 154*    IV Fluid Intake:    MEDICATIONS   PRN:      Diet:    thin liquids Activity:  Bedrest with Bathroom privileges DVT Prophylaxis:  Ambulating independently  CLINICALLY SIGNIFICANT STUDIES Basic Metabolic Panel:   Recent Labs Lab 08/04/14 1829 08/04/14 1848  NA 141 139  K 4.4 4.2  CL 103 101  CO2 25  --   GLUCOSE 216* 227*  BUN 17 18  CREATININE 0.67 0.80  CALCIUM 9.2  --    Liver Function Tests:   Recent Labs Lab 08/04/14 1829  AST 17  ALT 15  ALKPHOS 57  BILITOT 0.3  PROT 6.6  ALBUMIN 3.9   CBC:   Recent Labs Lab 08/04/14 1829 08/04/14 1848  WBC 7.3  --   NEUTROABS 3.5  --   HGB 13.2 13.3  HCT 39.5 39.0  MCV 90.8  --   PLT 180  --    Coagulation:   Recent Labs Lab 08/04/14 1829  LABPROT 18.1*  INR 1.50*   Cardiac Enzymes: No results found for this basename: CKTOTAL, CKMB, CKMBINDEX, TROPONINI,  in the last 168 hours Urinalysis:   Recent Labs Lab 08/05/14 1544  COLORURINE YELLOW  LABSPEC 1.019  PHURINE 5.5  GLUCOSEU NEGATIVE  HGBUR NEGATIVE  BILIRUBINUR NEGATIVE  KETONESUR 15*  PROTEINUR NEGATIVE  UROBILINOGEN 1.0  NITRITE NEGATIVE  LEUKOCYTESUR NEGATIVE   Lipid Panel  Component Value Date/Time   CHOL 137 08/05/2014 0453   TRIG 105 08/05/2014 0453   HDL 65 08/05/2014 0453   CHOLHDL 2.1 08/05/2014 0453   VLDL 21 08/05/2014 0453   LDLCALC 51 08/05/2014 0453   HgbA1C  Lab Results  Component Value Date   HGBA1C 8.3* 08/05/2014    Urine Drug Screen:   No results found for this basename: labopia,  cocainscrnur,  labbenz,  amphetmu,  thcu,  labbarb    Alcohol Level: No results found for this basename: ETH,  in the last 168 hours   CT of the brain  08/04/2014    1. No acute intracranial findings. 2. Chronic small vessel disease.     MRI of the brain  08/05/2014    1. No acute intracranial abnormality. 2. Stable white matter disease and remote lacunar infarcts. This likely reflects the  sequela of chronic microvascular ischemia. 3. Minimal fluid in the right mastoid air cells.   MRA of the brain  08/05/2014    4. Tortuosity and beaded irregularity of the cervical right internal carotid artery suggesting fibromuscular dysplasia. 5. 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 6. No significant branch vessel disease.     Carotid Doppler  No evidence of hemodynamically significant internal carotid artery stenosis. Vertebral artery flow is antegrade.   2D Echocardiogram  EF 55-60% with no source of embolus.   EKG  normal sinus rhythm. For complete results please see formal report.   Therapy Recommendations no therapy needs  Physical Exam   General - Well nourished, well developed, in no apparent distress.  Mental Status -  Level of arousal and orientation to time, place, and person were intact. Language including expression, naming, repetition, comprehension, reading, and writing was assessed and found intact. Attention span and concentration were normal.  Cranial Nerves II - XII - II - Vision intact OU. III, IV, VI - Extraocular movements intact. V - Facial sensation intact bilaterally. VII - Facial movement intact bilaterally. VIII - Hearing & vestibular intact bilaterally. X - Palate elevates symmetrically. XI - Chin turning & shoulder shrug intact bilaterally. XII - Tongue protrusion intact.  Motor Strength - The patient's strength was normal in all extremities and pronator drift was absent.  Bulk was normal and fasciculations were absent.   Motor Tone - Muscle tone was assessed at the neck and appendages and was normal.  Reflexes - The patient's reflexes were normal in all extremities and she had no pathological reflexes.  Sensory - Light touch, temperature/pinprick were assessed and were normal.    Coordination - The patient had normal movements in the hands and feet with no ataxia or dysmetria.   Tremor was absent.  Gait and Station - in  Bed, not tested.   ASSESSMENT [name] is a 80 y.o. female presenting with word finding difficulty, forgot password and numbness. At the same time, she had HA which not her typical HA but with wavy lines which was her typical aura. When HA resolves, her episodes also resolved. Currently neuro exam negative.  Imaging confirms no acute infarct. Her condition most likely complicated migraine. Low suspicious for TIA due to atypical presentation for stroke and also pt is on anticoagulation. On pradaxa (dabigatran) prior to admission. Patient with no resultant neuro deficits. Stroke work up completed.   Atrial flutter, on long-term anticoagulation hypertension Hyperlipidemia, LDL 51, on crestor 10 mg daily PTA, now on crestor 10  mg daily, goal LDL < 100 (< 70 for diabetics) Diabetes, HgbA1c 8.3, not in good control, goal < 7.0 Hx stroke CAD, hx MI  Hospital day # 1  TREATMENT/PLAN  Continue pradaxa (dabigatran) for secondary stroke prevention, no missing doses  Pt current episodes most likely related to complicated migraine. Less suspicious for TIA.   No need prophylaxis treatment for complicated migraine for now due to low frequency  Check UA to rule out UTI induced encephalopathy. Low suspicious too  A1C was high, indicating DM was not in control. She need home glucose monitoring and follow up with PCP in one week for DM management and stroke risk factor modification  Ok for discharge home from stroke standpoint  Had long discussion with pt and her daughter at bedside regarding complicated migraine, TIA, DM control and answered all their questions  Burnetta Sabin, MSN, RN, ANVP-BC, ANP-BC, GNP-BC [facility] Pager: [phone] 08/05/2014 10:19 PM  SIGNED I, the attending vascular neurologist, have personally obtained a history, examined the patient, evaluated laboratory data, individually viewed imaging studies, and  formulated the assessment and plan of care.  I have made any additions or clarifications directly to the above note and agree with the findings and plan as currently documented.   Rosalin Hawking, MD PhD Stroke Neurology 08/05/2014 10:28 PM  To contact Stroke Continuity provider, please refer to [email or web address] After hours, contact General Neurology

## 2014-08-05 NOTE — Progress Notes (Signed)
OT Discharge Note  Patient Details Name: [name] MRN: [id number] DOB: October 18, 1934   Cancelled Treatment:    Reason Eval/Treat Not Completed: OT screened, no needs identified, will sign off  Peri Maris 08/05/2014, 11:38 AM

## 2014-08-05 NOTE — Progress Notes (Signed)
Received pt from the ED a&o x 4. Vital signs taken, no c/o pain, but headach, tylenol 650 mg administered. otiented to room and unit. Call bell within reach, will cont to monitor.

## 2014-08-05 NOTE — Progress Notes (Signed)
UR completed 

## 2014-08-05 NOTE — Progress Notes (Signed)
Discharge orders received. Pt for discharge home today. IV d/c'd. Pt given discharge instructions with verbalized understanding. Family in room to assist with discharge. Staff brought pt downstairs via wheelchair.   

## 2014-08-05 NOTE — Discharge Summary (Signed)
Physician Discharge Summary  TAVI DOREN QMV:[id number] DOB: [birth year] DOA: 08/04/2014  PCP: Ginette Otto, MD  Admit date: 08/04/2014 Discharge date: 08/05/2014  Time spent: 35 minutes  Recommendations for Outpatient Follow-up:  1. Follow up with PCP in 2 week. 2. Check CBG's and titrate insulin as needed. 3. Consider changing to Lantus PEN  Discharge Diagnoses:  Active Problems:   Atrial flutter   Essential hypertension, benign   Long term (current) use of anticoagulants   Complicated migraine   Discharge Condition: stable  Diet recommendation: carb modififed  Filed Weights   08/04/14 2323  Weight: 82.5 kg (181 lb 14.1 oz)    History of present illness:  80 y.o. female who developed headache, R sided head numbness, and difficulty "with word finding" at 1pm today. LSN at 11 AM. She notes that she couldn't type in the password correctly for her computer "for some reason". Daughter who evaluated patient noted that the patient was "unable to correctly read off numbers that were written down". No unilateral weakness, extremity weakness, facial droop, difficulty walking or other focal neurologic deficit noted.   Hospital Course:  Complicated Migrane: - HgbA1c pending, fasting lipid panel HDL > 60, LDL < 100.  - MRI, MRA of the brain without contrast no CVA, ct head no bleed.  - PT consult, OT consult, Speech consult no needs. - Carotid dopplers , Echocardiogram pending  - Prophylactic therapy-Antiplatelet med: pradaxa  - Cardiac Monitoring showed no events.  - Neurochecks q4h.  Atrial flutter  - NSR, cont pradaxa.   HTN:  - continue home meds   DM2:  - continue home meds, CBG checks AC/HS  - pt on glipizide XL BID and it's a once a day drug will change to once a daily and increase lantus to 50 units. - consider change her to lantus PEN. Pt willlike to d/w PCP.    Procedures:  MRI Brain   Carotid doppler Findings consistent with 1-39 percent stenosis    CT head   Consultations:  neuro  Discharge Exam: Filed Vitals:   08/05/14 1426  BP: 147/57  Pulse: 77  Temp: 98 F (36.7 C)  Resp: 18    General: see progress note  Discharge Instructions You were cared for by a hospitalist during your hospital stay. If you have any questions about your discharge medications or the care you received while you were in the hospital after you are discharged, you can call the unit and asked to speak with the hospitalist on call if the hospitalist that took care of you is not available. Once you are discharged, your primary care physician will handle any further medical issues. Please note that NO REFILLS for any discharge medications will be authorized once you are discharged, as it is imperative that you return to your primary care physician (or establish a relationship with a primary care physician if you do not have one) for your aftercare needs so that they can reassess your need for medications and monitor your lab values.      Discharge Instructions   Diet - low sodium heart healthy    Complete by:  As directed      Increase activity slowly    Complete by:  As directed             Medication List         calcium carbonate 750 MG chewable tablet  Commonly known as:  TUMS EX  Chew 1 tablet by mouth daily.  carvedilol 25 MG tablet  Commonly known as:  COREG  Take 25 mg by mouth 2 (two) times daily with a meal.     clonazePAM 0.5 MG tablet  Commonly known as:  KLONOPIN  Take 0.25 mg by mouth at bedtime as needed (for sleep).     docusate sodium 100 MG capsule  Commonly known as:  COLACE  Take 100 mg by mouth daily as needed for mild constipation.     ENBREL 50 MG/ML injection  Generic drug:  etanercept  Inject 50 mg as directed every Tuesday.     glipiZIDE 5 MG 24 hr tablet  Commonly known as:  GLIPIZIDE XL  Take 2 tablets (10 mg total) by mouth daily with breakfast.     insulin glargine 100 UNIT/ML injection  Commonly  known as:  LANTUS  Inject 0.5 mLs (50 Units total) into the skin at bedtime.     metFORMIN 500 MG tablet  Commonly known as:  GLUCOPHAGE  Take 1,000 mg by mouth 2 (two) times daily.     MULTIVITAMIN PO  Take 1 tablet by mouth daily.     nitroGLYCERIN 0.4 MG SL tablet  Commonly known as:  NITROSTAT  Place 1 tablet (0.4 mg total) under the tongue as needed for chest pain.     PRADAXA 150 MG Caps capsule  Generic drug:  dabigatran  Take 150 mg by mouth 2 (two) times daily.     ramipril 5 MG capsule  Commonly known as:  ALTACE  Take 5 mg by mouth 2 (two) times daily.     rosuvastatin 10 MG tablet  Commonly known as:  CRESTOR  Take 10 mg by mouth at bedtime.     VITAMIN B 12 PO  Inject 1,000 mcg as directed every 30 (thirty) days.     Vitamin D 1000 UNITS capsule  Take 1,000 Units by mouth 2 (two) times daily.       Allergies  Allergen Reactions  . Penicillins Swelling and Other (See Comments)    Swelling of tongue  . Atorvastatin     Muscle soreness   Follow-up Information   Follow up with Ginette Otto, MD In 2 weeks. (hospital follow up)    Specialty:  Internal Medicine   Contact information:   [address] [place] [address]        The results of significant diagnostics from this hospitalization (including imaging, microbiology, ancillary and laboratory) are listed below for reference.    Significant Diagnostic Studies: Ct Head (brain) Wo Contrast  08/04/2014   CLINICAL DATA:  Numbness.  Right-sided headache.  EXAM: CT HEAD WITHOUT CONTRAST  TECHNIQUE: Contiguous axial images were obtained from the base of the skull through the vertex without intravenous contrast.  COMPARISON:  02/06/2011.  FINDINGS: Skull and Sinuses:Negative for fracture or destructive process. The mastoids, middle ears, and imaged paranasal sinuses are clear.  Orbits: No acute abnormality.  Brain: No evidence of acute abnormality, such as acute infarction, hemorrhage,  hydrocephalus, or mass lesion/mass effect. Generalized cerebral volume loss which is age appropriate and unchanged from 2012. Stable pattern of chronic small vessel disease with ischemic gliosis around the lateral ventricles. Remote small vessel infarct in the upper left cerebellum.  IMPRESSION: 1. No acute intracranial findings. 2. Chronic small vessel disease.   Electronically Signed   By: Tiburcio Pea M.D.   On: 08/04/2014 21:05   Mr Maxine Glenn Head Wo Contrast  08/05/2014   CLINICAL DATA:  New onset  right-sided headaches and difficulty with word-finding. Difficulty reading numbers.  EXAM: MRI HEAD WITHOUT CONTRAST  MRA HEAD WITHOUT CONTRAST  TECHNIQUE: Multiplanar, multiecho pulse sequences of the brain and surrounding structures were obtained without intravenous contrast. Angiographic images of the head were obtained using MRA technique without contrast.  COMPARISON:  CT head without contrast 08/04/2014. MRI brain and IAC 04/18/2014.  FINDINGS: MRI HEAD FINDINGS  The diffusion-weighted images demonstrate no evidence for acute or subacute infarction. No hemorrhage or mass lesion is present. Periventricular and subcortical white matter changes bilaterally are stable. A remote lacunar infarct is again noted within the left cerebellum. Extensive white matter changes in the brainstem are stable.  The ventricles are of normal size. No significant extra-axial fluid collection is present.  Minimal fluid is present in the right mastoid air cells. No obstructing nasopharyngeal lesion is evident. Globes and orbits are intact. The paranasal sinuses are clear.  The postcontrast images demonstrate no pathologic enhancement.  MRA HEAD FINDINGS  Moderate tortuosity is present in cervical right internal carotid artery. There is beaded irregularity proximal to this area, suggesting fibromuscular dysplasia. The cervical internal carotid arteries are within normal limits otherwise. There is mild irregularity in the cavernous  segments without significant stenosis. A 1 mm aneurysm or infundibulum is present at the right posterior communicating artery. The A1 and M1 segments are normal. The anterior communicating artery is patent. The ACA and MCA branch vessels are within normal limits.  The vertebral arteries are codominant. PICA origins are visualized and normal. The basilar artery is within normal limits. The right posterior cerebral artery originates from the basilar tip. The left posterior cerebral artery is of fetal type. The PCA branch vessels are intact.  IMPRESSION: 1. No acute intracranial abnormality. 2. Stable white matter disease and remote lacunar infarcts. This likely reflects the sequela of chronic microvascular ischemia. 3. Minimal fluid in the right mastoid air cells. 4. Tortuosity and beaded irregularity of the cervical right internal carotid artery suggesting fibromuscular dysplasia. 5. 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 6. No significant branch vessel disease.   Electronically Signed   By: Gennette Pac M.D.   On: 08/05/2014 12:52   Mr Laqueta Jean EP Contrast  08/05/2014   CLINICAL DATA:  New onset right-sided headaches and difficulty with word-finding. Difficulty reading numbers.  EXAM: MRI HEAD WITHOUT CONTRAST  MRA HEAD WITHOUT CONTRAST  TECHNIQUE: Multiplanar, multiecho pulse sequences of the brain and surrounding structures were obtained without intravenous contrast. Angiographic images of the head were obtained using MRA technique without contrast.  COMPARISON:  CT head without contrast 08/04/2014. MRI brain and IAC 04/18/2014.  FINDINGS: MRI HEAD FINDINGS  The diffusion-weighted images demonstrate no evidence for acute or subacute infarction. No hemorrhage or mass lesion is present. Periventricular and subcortical white matter changes bilaterally are stable. A remote lacunar infarct is again noted within the left cerebellum. Extensive  white matter changes in the brainstem are stable.  The ventricles are of normal size. No significant extra-axial fluid collection is present.  Minimal fluid is present in the right mastoid air cells. No obstructing nasopharyngeal lesion is evident. Globes and orbits are intact. The paranasal sinuses are clear.  The postcontrast images demonstrate no pathologic enhancement.  MRA HEAD FINDINGS  Moderate tortuosity is present in cervical right internal carotid artery. There is beaded irregularity proximal to this area, suggesting fibromuscular dysplasia. The cervical internal carotid arteries are within normal limits otherwise. There  is mild irregularity in the cavernous segments without significant stenosis. A 1 mm aneurysm or infundibulum is present at the right posterior communicating artery. The A1 and M1 segments are normal. The anterior communicating artery is patent. The ACA and MCA branch vessels are within normal limits.  The vertebral arteries are codominant. PICA origins are visualized and normal. The basilar artery is within normal limits. The right posterior cerebral artery originates from the basilar tip. The left posterior cerebral artery is of fetal type. The PCA branch vessels are intact.  IMPRESSION: 1. No acute intracranial abnormality. 2. Stable white matter disease and remote lacunar infarcts. This likely reflects the sequela of chronic microvascular ischemia. 3. Minimal fluid in the right mastoid air cells. 4. Tortuosity and beaded irregularity of the cervical right internal carotid artery suggesting fibromuscular dysplasia. 5. 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 6. No significant branch vessel disease.   Electronically Signed   By: Gennette Pac M.D.   On: 08/05/2014 12:52    Microbiology: No results found for this or any previous visit (from the past 240 hour(s)).   Labs: Basic Metabolic Panel:  Recent  Labs Lab 08/04/14 1829 08/04/14 1848  NA 141 139  K 4.4 4.2  CL 103 101  CO2 25  --   GLUCOSE 216* 227*  BUN 17 18  CREATININE 0.67 0.80  CALCIUM 9.2  --    Liver Function Tests:  Recent Labs Lab 08/04/14 1829  AST 17  ALT 15  ALKPHOS 57  BILITOT 0.3  PROT 6.6  ALBUMIN 3.9   No results found for this basename: LIPASE, AMYLASE,  in the last 168 hours No results found for this basename: AMMONIA,  in the last 168 hours CBC:  Recent Labs Lab 08/04/14 1829 08/04/14 1848  WBC 7.3  --   NEUTROABS 3.5  --   HGB 13.2 13.3  HCT 39.5 39.0  MCV 90.8  --   PLT 180  --    Cardiac Enzymes: No results found for this basename: CKTOTAL, CKMB, CKMBINDEX, TROPONINI,  in the last 168 hours BNP: BNP (last 3 results) No results found for this basename: PROBNP,  in the last 8760 hours CBG:  Recent Labs Lab 08/04/14 1853 08/04/14 2345 08/05/14 1239  GLUCAP 225* 255* 235*     Signed:  Marinda Elk  [facility] 08/05/2014, 3:08 PM

## 2014-08-05 NOTE — Progress Notes (Signed)
SLP Cancellation Note  Patient Details Name: [name] MRN: [id number] DOB: 1934/04/19   Cancelled treatment:       Reason Eval/Treat Not Completed: SLP screened, no needs identified, will sign off. Pt and daughter present both confirm that she has returned to her baseline for cognitive-linguistic function, and no acute infarct was noted in MRI report. Please re-order SLP services as needed.    Germain Osgood, M.A. CCC-SLP [phone]  Germain Osgood 08/05/2014, 1:55 PM

## 2014-08-05 NOTE — Progress Notes (Signed)
Pt is still down in Radiology. Her MRI has not started yet and will begin in the next 15 minutes.

## 2014-08-15 DIAGNOSIS — E538 Deficiency of other specified B group vitamins: Secondary | ICD-10-CM | POA: Diagnosis not present

## 2014-08-30 DIAGNOSIS — Z0000 Encounter for general adult medical examination without abnormal findings: Secondary | ICD-10-CM | POA: Diagnosis not present

## 2014-08-30 DIAGNOSIS — E119 Type 2 diabetes mellitus without complications: Secondary | ICD-10-CM | POA: Diagnosis not present

## 2014-08-30 DIAGNOSIS — Z1331 Encounter for screening for depression: Secondary | ICD-10-CM | POA: Diagnosis not present

## 2014-09-15 DIAGNOSIS — L4050 Arthropathic psoriasis, unspecified: Secondary | ICD-10-CM | POA: Diagnosis not present

## 2014-09-15 DIAGNOSIS — L408 Other psoriasis: Secondary | ICD-10-CM | POA: Diagnosis not present

## 2014-09-15 DIAGNOSIS — Z79899 Other long term (current) drug therapy: Secondary | ICD-10-CM | POA: Diagnosis not present

## 2014-09-15 DIAGNOSIS — M2550 Pain in unspecified joint: Secondary | ICD-10-CM | POA: Diagnosis not present

## 2014-09-16 DIAGNOSIS — E538 Deficiency of other specified B group vitamins: Secondary | ICD-10-CM | POA: Diagnosis not present

## 2014-09-21 DIAGNOSIS — L570 Actinic keratosis: Secondary | ICD-10-CM | POA: Diagnosis not present

## 2014-09-21 DIAGNOSIS — Z85828 Personal history of other malignant neoplasm of skin: Secondary | ICD-10-CM | POA: Diagnosis not present

## 2014-09-21 DIAGNOSIS — D1801 Hemangioma of skin and subcutaneous tissue: Secondary | ICD-10-CM | POA: Diagnosis not present

## 2014-09-21 DIAGNOSIS — L821 Other seborrheic keratosis: Secondary | ICD-10-CM | POA: Diagnosis not present

## 2014-09-21 DIAGNOSIS — L819 Disorder of pigmentation, unspecified: Secondary | ICD-10-CM | POA: Diagnosis not present

## 2014-09-28 ENCOUNTER — Other Ambulatory Visit: Payer: Self-pay

## 2014-09-28 DIAGNOSIS — Z1231 Encounter for screening mammogram for malignant neoplasm of breast: Secondary | ICD-10-CM

## 2014-10-03 DIAGNOSIS — Z01419 Encounter for gynecological examination (general) (routine) without abnormal findings: Secondary | ICD-10-CM | POA: Diagnosis not present

## 2014-10-03 DIAGNOSIS — N762 Acute vulvitis: Secondary | ICD-10-CM | POA: Diagnosis not present

## 2014-10-03 DIAGNOSIS — Z124 Encounter for screening for malignant neoplasm of cervix: Secondary | ICD-10-CM | POA: Diagnosis not present

## 2014-10-13 ENCOUNTER — Other Ambulatory Visit: Payer: Self-pay | Admitting: Obstetrics

## 2014-10-13 DIAGNOSIS — M810 Age-related osteoporosis without current pathological fracture: Secondary | ICD-10-CM

## 2014-10-14 ENCOUNTER — Other Ambulatory Visit: Payer: Self-pay

## 2014-10-17 DIAGNOSIS — E538 Deficiency of other specified B group vitamins: Secondary | ICD-10-CM | POA: Diagnosis not present

## 2014-10-31 ENCOUNTER — Ambulatory Visit (INDEPENDENT_AMBULATORY_CARE_PROVIDER_SITE_OTHER): Payer: Medicare Other | Admitting: Neurology

## 2014-10-31 ENCOUNTER — Encounter: Payer: Self-pay | Admitting: Neurology

## 2014-10-31 VITALS — BP 105/62 | HR 82 | Ht 62.75 in | Wt 186.8 lb

## 2014-10-31 DIAGNOSIS — I4892 Unspecified atrial flutter: Secondary | ICD-10-CM

## 2014-10-31 DIAGNOSIS — G43909 Migraine, unspecified, not intractable, without status migrainosus: Secondary | ICD-10-CM | POA: Diagnosis not present

## 2014-10-31 DIAGNOSIS — H8110 Benign paroxysmal vertigo, unspecified ear: Secondary | ICD-10-CM | POA: Insufficient documentation

## 2014-10-31 DIAGNOSIS — G43109 Migraine with aura, not intractable, without status migrainosus: Secondary | ICD-10-CM

## 2014-10-31 NOTE — Patient Instructions (Addendum)
-   your symptoms are consistent with BPPV which is benign position paroxysmal vertigo - will refer you to physical therapy for maneuver training - do the manneuver 3-4 times a day until no vertigo episode for a month - Follow up with your primary care physician for stroke risk factor modification. Recommend maintain blood pressure goal <130/80, diabetes with hemoglobin A1c goal below 6.5% and lipids with LDL cholesterol goal below 70 mg/dL.  - follow up in 2 months. - continue pradaxa and crestor for stroke prevention.

## 2014-10-31 NOTE — Progress Notes (Signed)
STROKE NEUROLOGY FOLLOW UP NOTE  NAME: [name] DOB: Nov 30, 1934  REASON FOR VISIT: stroke follow up HISTORY FROM: pt and chart  Today we had the pleasure of seeing [name] in follow-up at our [facility]rology [facility]. Pt was accompanied by daughter-in-law.   History Summary [name] is an 80 y.o. female history of atrial flutter on Pradaxa, previous stroke, myocardial infarction, hypertension, hyperlipidemia and diabetes mellitus, was admitted on 08/04/14 due to speech output difficulty. Patient had difficulty formulating words. As well she had problems with not being able to type password to log into her computer. She had mild numbness involving the upper right face at the onset of her symptoms. Symptoms completely resolved shortly after arriving in the emergency room. CT scan of the head showed no acute intracranial abnormality. NIH stroke score was 0 at the time of this evaluation. Patient was not administered TPA secondary to deficits resolved; on anticoagulation with Pradaxa. She had Afib /flutter and on pradaxa. She took it faithfully.   She stated that she had hx of migraine and last one was several years ago. For the migraine HA, she will have photophobia, visual changes with wavy lines. She started to have HA during the episode of speech difficulty, right frontal but not as bad as previous migraine but she had same wavy lines as usual. Episodes of speech difficulty and right face numbness resolved along with resolution of HA.   MRI showed no acute infarct and her episode was felt to be complicated migraine. Low suspicious for TIA due to atypical presentation for stroke and also pt is on anticoagulation. She was discharged with pradaxa and continued on crestor.   Interval History During the interval time, the patient has been doing well from stroke prospective. However, she came in with new complains. She stated that for the last several years, she had episodic vertigo. She  would just sitting in chair and all of sudden she would feel vertigo, room spinning, N/V, lasting about 5 min and ease off some, she would go to bed rest and second day she was back to baseline. It would happen 1-2 per year, and no significant treatment needed.   For the last 2-3 weeks, she had episode of room spinning when she was in bed turning side by side, the episodes lasted less than one minute. When she got up after the episode over, she would feel somehow wobbly with head movement, had to hold on things for walking, unsteady, off balance associated with head motion, not vertigo, lasting 10-15 min and it resolved. This could happen several days in a row. For the last one week though, she was doing fine without episodes. She denies any weakness, numbness, LOC, speech difficulty or vision changes. She stated that she has ear ringing for years as well as hearing loss requiring hearing aids.  Her BP was 105/62 today in clinic. Denies any dizziness, blurry vision.  REVIEW OF SYSTEMS: Full 14 system review of systems performed and notable only for those listed below and in HPI above, all others are negative:  Constitutional: N/A  Cardiovascular: swelling in legs  Ear/Nose/Throat: hearing loss, ringing in ears  Skin: N/A  Eyes: N/A  Respiratory: N/A  Gastroitestinal: N/A  Genitourinary: N/A Hematology/Lymphatic: N/A  Endocrine: feeling hot  Musculoskeletal: joint pain  Allergy/Immunology: N/A  Neurological: N/A  Psychiatric: N/A  The following represents the patient's updated allergies and side effects list: Allergies  Allergen Reactions  . Penicillins Swelling and Other (  See Comments)    Swelling of tongue  . Atorvastatin     Muscle soreness    Labs since last visit of relevance include the following: Results for orders placed or performed during the hospital encounter of 08/04/14  Protime-INR  Result Value Ref Range   Prothrombin Time 18.1 (H) 11.6 - 15.2 seconds   INR 1.50 (H)  0.00 - 1.49  APTT  Result Value Ref Range   aPTT 47 (H) 24 - 37 seconds  CBC  Result Value Ref Range   WBC 7.3 4.0 - 10.5 K/uL   RBC 4.35 3.87 - 5.11 MIL/uL   Hemoglobin 13.2 12.0 - 15.0 g/dL   HCT 40.9 81.1 - 91.4 %   MCV 90.8 78.0 - 100.0 fL   MCH 30.3 26.0 - 34.0 pg   MCHC 33.4 30.0 - 36.0 g/dL   RDW 78.2 95.6 - 21.3 %   Platelets 180 150 - 400 K/uL  Differential  Result Value Ref Range   Neutrophils Relative % 48 43 - 77 %   Neutro Abs 3.5 1.7 - 7.7 K/uL   Lymphocytes Relative 43 12 - 46 %   Lymphs Abs 3.2 0.7 - 4.0 K/uL   Monocytes Relative 7 3 - 12 %   Monocytes Absolute 0.5 0.1 - 1.0 K/uL   Eosinophils Relative 2 0 - 5 %   Eosinophils Absolute 0.2 0.0 - 0.7 K/uL   Basophils Relative 0 0 - 1 %   Basophils Absolute 0.0 0.0 - 0.1 K/uL  Comprehensive metabolic panel  Result Value Ref Range   Sodium 141 137 - 147 mEq/L   Potassium 4.4 3.7 - 5.3 mEq/L   Chloride 103 96 - 112 mEq/L   CO2 25 19 - 32 mEq/L   Glucose, Bld 216 (H) 70 - 99 mg/dL   BUN 17 6 - 23 mg/dL   Creatinine, Ser 0.86 0.50 - 1.10 mg/dL   Calcium 9.2 8.4 - 57.8 mg/dL   Total Protein 6.6 6.0 - 8.3 g/dL   Albumin 3.9 3.5 - 5.2 g/dL   AST 17 0 - 37 U/L   ALT 15 0 - 35 U/L   Alkaline Phosphatase 57 39 - 117 U/L   Total Bilirubin 0.3 0.3 - 1.2 mg/dL   GFR calc non Af Amer 82 (L) >90 mL/min   GFR calc Af Amer >90 >90 mL/min   Anion gap 13 5 - 15  Hemoglobin A1c  Result Value Ref Range   Hgb A1c MFr Bld 8.4 (H) <5.7 %   Mean Plasma Glucose 194 (H) <117 mg/dL  Lipid panel  Result Value Ref Range   Cholesterol 137 0 - 200 mg/dL   Triglycerides 469 <629 mg/dL   HDL 65 >52 mg/dL   Total CHOL/HDL Ratio 2.1 RATIO   VLDL 21 0 - 40 mg/dL   LDL Cholesterol 51 0 - 99 mg/dL  Glucose, capillary  Result Value Ref Range   Glucose-Capillary 255 (H) 70 - 99 mg/dL  Hemoglobin W4X  Result Value Ref Range   Hgb A1c MFr Bld 8.3 (H) <5.7 %   Mean Plasma Glucose 192 (H) <117 mg/dL  Glucose, capillary  Result Value  Ref Range   Glucose-Capillary 235 (H) 70 - 99 mg/dL  Urinalysis, Routine w reflex microscopic  Result Value Ref Range   Color, Urine YELLOW YELLOW   APPearance CLEAR CLEAR   Specific Gravity, Urine 1.019 1.005 - 1.030   pH 5.5 5.0 - 8.0   Glucose, UA NEGATIVE NEGATIVE  mg/dL   Hgb urine dipstick NEGATIVE NEGATIVE   Bilirubin Urine NEGATIVE NEGATIVE   Ketones, ur 15 (A) NEGATIVE mg/dL   Protein, ur NEGATIVE NEGATIVE mg/dL   Urobilinogen, UA 1.0 0.0 - 1.0 mg/dL   Nitrite NEGATIVE NEGATIVE   Leukocytes, UA NEGATIVE NEGATIVE  Glucose, capillary  Result Value Ref Range   Glucose-Capillary 154 (H) 70 - 99 mg/dL  I-Stat Chem 8, ED  Result Value Ref Range   Sodium 139 137 - 147 mEq/L   Potassium 4.2 3.7 - 5.3 mEq/L   Chloride 101 96 - 112 mEq/L   BUN 18 6 - 23 mg/dL   Creatinine, Ser 3.24 0.50 - 1.10 mg/dL   Glucose, Bld 401 (H) 70 - 99 mg/dL   Calcium, Ion 0.27 2.53 - 1.30 mmol/L   TCO2 24 0 - 100 mmol/L   Hemoglobin 13.3 12.0 - 15.0 g/dL   HCT 66.4 40.3 - 47.4 %  I-stat troponin, ED (not at [facility])  Result Value Ref Range   Troponin i, poc 0.00 0.00 - 0.08 ng/mL   Comment 3          CBG monitoring, ED  Result Value Ref Range   Glucose-Capillary 225 (H) 70 - 99 mg/dL    The neurologically relevant items on the patient's problem list were reviewed on today's visit.  Neurologic Examination  A problem focused neurological exam (12 or more points of the single system neurologic examination, vital signs counts as 1 point, cranial nerves count for 8 points) was performed.  Blood pressure 105/62, pulse 82, height 5' 2.75" (1.594 m), weight 186 lb 12.8 oz (84.732 kg).  General - Well nourished, well developed, in no apparent distress.  Ophthalmologic - not able to see throgh.  Cardiovascular - Regular rate and rhythm with no murmur.  Mental Status -  Level of arousal and orientation to time, place, and person were intact. Language including expression, naming, repetition,  comprehension was assessed and found intact.  Cranial Nerves II - XII - II - Visual field intact OU. III, IV, VI - Extraocular movements intact. V - Facial sensation intact bilaterally. VII - Facial movement intact bilaterally. VIII - Hearing & vestibular intact bilaterally. X - Palate elevates symmetrically. XI - Chin turning & shoulder shrug intact bilaterally. XII - Tongue protrusion intact.  Motor Strength - The patient's strength was normal in all extremities and pronator drift was absent.  Bulk was normal and fasciculations were absent.   Motor Tone - Muscle tone was assessed at the neck and appendages and was normal.  Reflexes - The patient's reflexes were 1+ in all extremities and she had no pathological reflexes.  Sensory - Light touch, temperature/pinprick were assessed and were normal.    Coordination - The patient had normal movements in the hands and feet with no ataxia or dysmetria.  Tremor was absent.  Gait and Station - slow walking with small stride, broad based gait with en bloc turns. Dix-Hallpike test negative.  Data reviewed: I personally reviewed the images and agree with the radiology interpretations.  CT of the brain 08/04/2014 1. No acute intracranial findings. 2. Chronic small vessel disease.   MRI of the brain 08/05/2014 1. No acute intracranial abnormality. 2. Stable white matter disease and remote lacunar infarcts. This likely reflects the sequela of chronic microvascular ischemia. 3. Minimal fluid in the right mastoid air cells.   MRA of the brain 08/05/2014 4. Tortuosity and beaded irregularity of the cervical right internal carotid artery suggesting fibromuscular  dysplasia. 5. 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 6. No significant branch vessel disease.   Carotid Doppler No evidence of hemodynamically significant internal carotid artery stenosis. Vertebral artery flow  is antegrade.   2D Echocardiogram EF 55-60% with no source of embolus.   EKG normal sinus rhythm.   A1C 8.4 and LDL 51  Assessment: As you may recall, she is a 80 y.o. Caucasian female with PMH of atrial flutter on Pradaxa, previous stroke, myocardial infarction, hypertension, hyperlipidemia and diabetes mellitus as well as migraine with aura, was admitted on 08/04/14 due to speech output difficulty. MRI no acute infarct and her symptoms per description more consistent with complicated migraine. She was discharged with continuing pradaxa and crestor for stroke prevention. Today, her complain was vertigo with turning in bed and unsteadiness after getting up with head motion, all short lasting, consistent with BPPV. Dix-Halpike test negative. Will need PT for maneuver training. Her BP was on the low side today 105/62, however, she does not have symptoms. She is on coreg only. Will need check BP at home to avoid hypotension.  Plan:  - refer to PT for maneuver training. - Follow up with your primary care physician for stroke risk factor modification. Recommend maintain blood pressure goal <130/80, diabetes with hemoglobin A1c goal below 6.5% and lipids with LDL cholesterol goal below 70 mg/dL.  - continue pradaxa and crestor for stroke prevention. - check BP at home and avoid hypotension. - RTC in 2 months.  Orders Placed This Encounter  Procedures  . Ambulatory referral to Physical Therapy    Referral Priority:  Routine    Referral Type:  Physical Medicine    Referral Reason:  Specialty Services Required    Requested Specialty:  Physical Therapy    Number of Visits Requested:  1    Meds ordered this encounter  Medications  . clobetasol ointment (TEMOVATE) 0.05 %    Sig:     Refill:  3  . nystatin-triamcinolone ointment (MYCOLOG)    Sig:     Refill:  0  . fluorouracil (EFUDEX) 5 % cream    Sig:     Refill:  0    Patient Instructions  - your symptoms are consistent with BPPV  which is benign position paroxysmal vertigo - will refer you to physical therapy for maneuver training - do the manneuver 3-4 times a day until no vertigo episode for a month - Follow up with your primary care physician for stroke risk factor modification. Recommend maintain blood pressure goal <130/80, diabetes with hemoglobin A1c goal below 6.5% and lipids with LDL cholesterol goal below 70 mg/dL.  - follow up in 2 months. - continue pradaxa and crestor for stroke prevention.   Marvel Plan, MD PhD [facility] [address], [address] [place], [place] [address] [phone]

## 2014-11-01 DIAGNOSIS — Z79899 Other long term (current) drug therapy: Secondary | ICD-10-CM | POA: Diagnosis not present

## 2014-11-01 DIAGNOSIS — I10 Essential (primary) hypertension: Secondary | ICD-10-CM | POA: Diagnosis not present

## 2014-11-01 DIAGNOSIS — E11319 Type 2 diabetes mellitus with unspecified diabetic retinopathy without macular edema: Secondary | ICD-10-CM | POA: Diagnosis not present

## 2014-11-03 ENCOUNTER — Ambulatory Visit (INDEPENDENT_AMBULATORY_CARE_PROVIDER_SITE_OTHER): Payer: Medicare Other | Admitting: Interventional Cardiology

## 2014-11-03 ENCOUNTER — Encounter: Payer: Self-pay | Admitting: Interventional Cardiology

## 2014-11-03 VITALS — BP 118/62 | HR 80 | Ht 62.0 in | Wt 186.0 lb

## 2014-11-03 DIAGNOSIS — I25810 Atherosclerosis of coronary artery bypass graft(s) without angina pectoris: Secondary | ICD-10-CM

## 2014-11-03 DIAGNOSIS — I4892 Unspecified atrial flutter: Secondary | ICD-10-CM | POA: Diagnosis not present

## 2014-11-03 DIAGNOSIS — I10 Essential (primary) hypertension: Secondary | ICD-10-CM

## 2014-11-03 DIAGNOSIS — Z7901 Long term (current) use of anticoagulants: Secondary | ICD-10-CM

## 2014-11-03 NOTE — Patient Instructions (Signed)
Your physician recommends that you continue on your current medications as directed. Please refer to the Current Medication list given to you today.  Your physician wants you to follow-up in: 1 year with Dr.Smith You will receive a reminder letter in the mail two months in advance. If you don't receive a letter, please call our office to schedule the follow-up appointment.  

## 2014-11-03 NOTE — Progress Notes (Signed)
Patient ID: [name], female   DOB: 1934-02-28, 80 y.o.   MRN: [id number]    [address] [place], [place]  [address] Phone: [phone] Fax:  [phone]  Date:  11/03/2014   ID:  [name], DOB 05-21-1934, MRN [id number]  PCP:  Ginette Otto, MD   ASSESSMENT:  1. Coronary artery disease, asymptomatic 2. Paroxysmal atrial fibrillation, stable without symptoms 3. Chronic anticoagulation with Pradaxa 4. Hypertension  PLAN:  1. Continue current medical regimen 2. Clinical follow-up in one year 3. Low salt diet 4. Aerobic activity as much as possible   SUBJECTIVE: [name] is a 80 y.o. female who is doing well. She has a benign positional paroxysmal vertigo. She is not had palpitations. She denies angina. No lower extremity edema. She has not had syncope.   Wt Readings from Last 3 Encounters:  11/03/14 186 lb (84.369 kg)  10/31/14 186 lb 12.8 oz (84.732 kg)  08/04/14 181 lb 14.1 oz (82.5 kg)     Past Medical History  Diagnosis Date  . Long term (current) use of anticoagulants 11/02/2013    Pradaxa   . Atrial flutter 11/02/2013  . Essential hypertension, benign 11/02/2013  . Coronary artery disease   . Stroke   . Diabetes mellitus without complication   . Acute MI   . Vertigo   . High cholesterol   . Heart disease     Current Outpatient Prescriptions  Medication Sig Dispense Refill  . calcium carbonate (TUMS EX) 750 MG chewable tablet Chew 1 tablet by mouth daily.    . carvedilol (COREG) 25 MG tablet Take 25 mg by mouth 2 (two) times daily with a meal.    . Cholecalciferol (VITAMIN D) 1000 UNITS capsule Take 1,000 Units by mouth 2 (two) times daily.    . clobetasol ointment (TEMOVATE) 0.05 %   3  . clonazePAM (KLONOPIN) 0.5 MG tablet Take 0.25 mg by mouth at bedtime as needed (for sleep).     . Cyanocobalamin (VITAMIN B 12 PO) Inject 1,000 mcg as directed every 30 (thirty) days.    Marland Kitchen docusate sodium (COLACE) 100 MG capsule  Take 100 mg by mouth daily as needed for mild constipation.    . ENBREL 50 MG/ML injection Inject 50 mg as directed every Tuesday.     . fluorouracil (EFUDEX) 5 % cream   0  . glipiZIDE (GLIPIZIDE XL) 5 MG 24 hr tablet Take 2 tablets (10 mg total) by mouth daily with breakfast. 30 tablet 0  . insulin glargine (LANTUS) 100 UNIT/ML injection Inject 0.5 mLs (50 Units total) into the skin at bedtime. (Patient taking differently: Inject 42 Units into the skin at bedtime. ) 10 mL 11  . metFORMIN (GLUCOPHAGE) 500 MG tablet Take 1,000 mg by mouth 2 (two) times daily.    . Multiple Vitamins-Minerals (MULTIVITAMIN PO) Take 1 tablet by mouth daily.     . nitroGLYCERIN (NITROSTAT) 0.4 MG SL tablet Place 1 tablet (0.4 mg total) under the tongue as needed for chest pain. 25 tablet 3  . nystatin-triamcinolone ointment (MYCOLOG)   0  . PRADAXA 150 MG CAPS Take 150 mg by mouth 2 (two) times daily.     . ramipril (ALTACE) 5 MG capsule Take 5 mg by mouth 2 (two) times daily.    . rosuvastatin (CRESTOR) 10 MG tablet Take 10 mg by mouth at bedtime.      No current facility-administered medications for this visit.  Allergies:    Allergies  Allergen Reactions  . Penicillins Swelling and Other (See Comments)    Swelling of tongue  . Atorvastatin     Muscle soreness    Social History:  The patient  reports that she has never smoked. She has never used smokeless tobacco. She reports that she does not drink alcohol or use illicit drugs.   ROS:  Please see the history of present illness.   Recent episode of word finding difficulty. This required an overnight admission. Anticoagulation therapy was not changed. It lasted 3 hours. Scanning and Doppler imaging was unremarkable.   All other systems reviewed and negative.   OBJECTIVE: VS:  BP 118/62 mmHg  Pulse 80  Ht 5\' 2"  (1.575 m)  Wt 186 lb (84.369 kg)  BMI 34.01 kg/m2 Well nourished, well developed, in no acute distress, elderly HEENT: normal Neck: JVD  flat. Carotid bruit absent  Cardiac:  normal S1, S2; RRR; no murmur Lungs:  clear to auscultation bilaterally, no wheezing, rhonchi or rales Abd: soft, nontender, no hepatomegaly Ext: Edema absent. Pulses 2+ Skin: warm and dry Neuro:  CNs 2-12 intact, no focal abnormalities noted  EKG:  Not performed       Signed, Darci Needle III, MD 11/03/2014 9:41 AM

## 2014-11-08 ENCOUNTER — Ambulatory Visit
Admission: RE | Admit: 2014-11-08 | Discharge: 2014-11-08 | Disposition: A | Discharge status: Home / Self Care | Payer: Medicare Other | Source: Ambulatory Visit

## 2014-11-08 ENCOUNTER — Ambulatory Visit
Admission: RE | Admit: 2014-11-08 | Discharge: 2014-11-08 | Disposition: A | Discharge status: Home / Self Care | Payer: Medicare Other | Source: Ambulatory Visit | Attending: Obstetrics | Admitting: Obstetrics

## 2014-11-08 ENCOUNTER — Ambulatory Visit: Payer: Medicare Other

## 2014-11-08 DIAGNOSIS — M810 Age-related osteoporosis without current pathological fracture: Secondary | ICD-10-CM | POA: Diagnosis not present

## 2014-11-08 DIAGNOSIS — Z1231 Encounter for screening mammogram for malignant neoplasm of breast: Secondary | ICD-10-CM

## 2014-11-18 DIAGNOSIS — E538 Deficiency of other specified B group vitamins: Secondary | ICD-10-CM | POA: Diagnosis not present

## 2014-11-18 DIAGNOSIS — Z23 Encounter for immunization: Secondary | ICD-10-CM | POA: Diagnosis not present

## 2014-11-28 ENCOUNTER — Other Ambulatory Visit: Payer: Self-pay | Admitting: Interventional Cardiology

## 2014-11-28 ENCOUNTER — Ambulatory Visit: Payer: Medicare Other | Admitting: Rehabilitative and Restorative Service Providers"

## 2014-12-19 DIAGNOSIS — E538 Deficiency of other specified B group vitamins: Secondary | ICD-10-CM | POA: Diagnosis not present

## 2015-01-12 ENCOUNTER — Ambulatory Visit: Payer: Medicare Other | Admitting: Neurology

## 2015-01-23 ENCOUNTER — Other Ambulatory Visit: Payer: Self-pay | Admitting: Interventional Cardiology

## 2015-01-26 DIAGNOSIS — M25562 Pain in left knee: Secondary | ICD-10-CM | POA: Diagnosis not present

## 2015-01-26 DIAGNOSIS — L4059 Other psoriatic arthropathy: Secondary | ICD-10-CM | POA: Diagnosis not present

## 2015-01-26 DIAGNOSIS — L408 Other psoriasis: Secondary | ICD-10-CM | POA: Diagnosis not present

## 2015-01-31 DIAGNOSIS — E11319 Type 2 diabetes mellitus with unspecified diabetic retinopathy without macular edema: Secondary | ICD-10-CM | POA: Diagnosis not present

## 2015-01-31 DIAGNOSIS — I10 Essential (primary) hypertension: Secondary | ICD-10-CM | POA: Diagnosis not present

## 2015-01-31 DIAGNOSIS — E538 Deficiency of other specified B group vitamins: Secondary | ICD-10-CM | POA: Diagnosis not present

## 2015-02-09 DIAGNOSIS — L4059 Other psoriatic arthropathy: Secondary | ICD-10-CM | POA: Diagnosis not present

## 2015-02-09 DIAGNOSIS — M25561 Pain in right knee: Secondary | ICD-10-CM | POA: Diagnosis not present

## 2015-02-23 ENCOUNTER — Ambulatory Visit (INDEPENDENT_AMBULATORY_CARE_PROVIDER_SITE_OTHER): Payer: Medicare Other | Admitting: Neurology

## 2015-02-23 ENCOUNTER — Encounter: Payer: Self-pay | Admitting: Neurology

## 2015-02-23 VITALS — BP 125/69 | HR 83 | Ht 62.0 in | Wt 181.4 lb

## 2015-02-23 DIAGNOSIS — I10 Essential (primary) hypertension: Secondary | ICD-10-CM | POA: Diagnosis not present

## 2015-02-23 DIAGNOSIS — G43909 Migraine, unspecified, not intractable, without status migrainosus: Secondary | ICD-10-CM | POA: Diagnosis not present

## 2015-02-23 DIAGNOSIS — G43109 Migraine with aura, not intractable, without status migrainosus: Secondary | ICD-10-CM

## 2015-02-23 DIAGNOSIS — I4892 Unspecified atrial flutter: Secondary | ICD-10-CM | POA: Diagnosis not present

## 2015-02-23 DIAGNOSIS — H8110 Benign paroxysmal vertigo, unspecified ear: Secondary | ICD-10-CM

## 2015-02-23 NOTE — Progress Notes (Signed)
STROKE NEUROLOGY FOLLOW UP NOTE  NAME: [name] DOB: 01-04-1934  REASON FOR VISIT: stroke follow up HISTORY FROM: pt and chart  Today we had the pleasure of seeing [name] in follow-up at our Neurology Clinic. Pt was accompanied by daughter-in-law.   History Summary [name] is an 81 y.o. female history of atrial flutter on Pradaxa, previous stroke, myocardial infarction, hypertension, hyperlipidemia and diabetes mellitus, was admitted on 08/04/14 due to speech output difficulty. Patient had difficulty formulating words. As well she had problems with not being able to type password to log into her computer. She had mild numbness involving the upper right face at the onset of her symptoms. Symptoms completely resolved shortly after arriving in the emergency room. CT scan of the head showed no acute intracranial abnormality. NIH stroke score was 0 at the time of this evaluation. Patient was not administered TPA secondary to deficits resolved; on anticoagulation with Pradaxa. She had Afib /flutter and on pradaxa. She took it faithfully.   She stated that she had hx of migraine and last one was several years ago. For the migraine HA, she will have photophobia, visual changes with wavy lines. She started to have HA during the episode of speech difficulty, right frontal but not as bad as previous migraine but she had same wavy lines as usual. Episodes of speech difficulty and right face numbness resolved along with resolution of HA.   MRI showed no acute infarct and her episode was felt to be complicated migraine. Low suspicious for TIA due to atypical presentation for stroke and also pt is on anticoagulation. She was discharged with pradaxa and continued on crestor.   10/31/14 follow up - the patient has been doing well from stroke prospective. However, she came in with new complains. She stated that for the last several years, she had episodic vertigo. She would just sitting in  chair and all of sudden she would feel vertigo, room spinning, N/V, lasting about 5 min and ease off some, she would go to bed rest and second day she was back to baseline. It would happen 1-2 per year, and no significant treatment needed.   For the last 2-3 weeks, she had episode of room spinning when she was in bed turning side by side, the episodes lasted less than one minute. When she got up after the episode over, she would feel somehow wobbly with head movement, had to hold on things for walking, unsteady, off balance associated with head motion, not vertigo, lasting 10-15 min and it resolved. This could happen several days in a row. For the last one week though, she was doing fine without episodes. She denies any weakness, numbness, LOC, speech difficulty or vision changes. She stated that she has ear ringing for years as well as hearing loss requiring hearing aids.  Her BP was 105/62 today in clinic. Denies any dizziness, blurry vision.  Interval History During the interval time, she was doing well. There is no vertigo episodes. She has not yet to see PT for BPPV maneuver yet as her husband had hospitalization and surgery from Nov to Jan. She is going to arrange for PT appointment soon. She also had a couple of migraine aura with visual changes but no headache or neurological changes. She is still on pradaxa and her BP today in clinic 125/69.   REVIEW OF SYSTEMS: Full 14 system review of systems performed and notable only for those listed below and in HPI above,  all others are negative:  Constitutional: N/A  Cardiovascular: swelling in legs  Ear/Nose/Throat: hearing loss, ringing in ears  Skin: N/A  Eyes: N/A  Respiratory: N/A  Gastroitestinal: N/A  Genitourinary: N/A Hematology/Lymphatic: N/A  Endocrine: N/A  Musculoskeletal: joint pain, back pain, walking difficulty  Allergy/Immunology: N/A  Neurological: N/A  Psychiatric: N/A Sleep: frequent waking, daytime sleepiness  The  following represents the patient's updated allergies and side effects list: Allergies  Allergen Reactions  . Penicillins Swelling and Other (See Comments)    Swelling of tongue  . Atorvastatin     Muscle soreness    Labs since last visit of relevance include the following: Results for orders placed or performed during the hospital encounter of 08/04/14  Protime-INR  Result Value Ref Range   Prothrombin Time 18.1 (H) 11.6 - 15.2 seconds   INR 1.50 (H) 0.00 - 1.49  APTT  Result Value Ref Range   aPTT 47 (H) 24 - 37 seconds  CBC  Result Value Ref Range   WBC 7.3 4.0 - 10.5 K/uL   RBC 4.35 3.87 - 5.11 MIL/uL   Hemoglobin 13.2 12.0 - 15.0 g/dL   HCT 28.4 13.2 - 44.0 %   MCV 90.8 78.0 - 100.0 fL   MCH 30.3 26.0 - 34.0 pg   MCHC 33.4 30.0 - 36.0 g/dL   RDW 10.2 72.5 - 36.6 %   Platelets 180 150 - 400 K/uL  Differential  Result Value Ref Range   Neutrophils Relative % 48 43 - 77 %   Neutro Abs 3.5 1.7 - 7.7 K/uL   Lymphocytes Relative 43 12 - 46 %   Lymphs Abs 3.2 0.7 - 4.0 K/uL   Monocytes Relative 7 3 - 12 %   Monocytes Absolute 0.5 0.1 - 1.0 K/uL   Eosinophils Relative 2 0 - 5 %   Eosinophils Absolute 0.2 0.0 - 0.7 K/uL   Basophils Relative 0 0 - 1 %   Basophils Absolute 0.0 0.0 - 0.1 K/uL  Comprehensive metabolic panel  Result Value Ref Range   Sodium 141 137 - 147 mEq/L   Potassium 4.4 3.7 - 5.3 mEq/L   Chloride 103 96 - 112 mEq/L   CO2 25 19 - 32 mEq/L   Glucose, Bld 216 (H) 70 - 99 mg/dL   BUN 17 6 - 23 mg/dL   Creatinine, Ser 4.40 0.50 - 1.10 mg/dL   Calcium 9.2 8.4 - 34.7 mg/dL   Total Protein 6.6 6.0 - 8.3 g/dL   Albumin 3.9 3.5 - 5.2 g/dL   AST 17 0 - 37 U/L   ALT 15 0 - 35 U/L   Alkaline Phosphatase 57 39 - 117 U/L   Total Bilirubin 0.3 0.3 - 1.2 mg/dL   GFR calc non Af Amer 82 (L) >90 mL/min   GFR calc Af Amer >90 >90 mL/min   Anion gap 13 5 - 15  Hemoglobin A1c  Result Value Ref Range   Hgb A1c MFr Bld 8.4 (H) <5.7 %   Mean Plasma Glucose 194 (H)  <117 mg/dL  Lipid panel  Result Value Ref Range   Cholesterol 137 0 - 200 mg/dL   Triglycerides 425 <956 mg/dL   HDL 65 >38 mg/dL   Total CHOL/HDL Ratio 2.1 RATIO   VLDL 21 0 - 40 mg/dL   LDL Cholesterol 51 0 - 99 mg/dL  Glucose, capillary  Result Value Ref Range   Glucose-Capillary 255 (H) 70 - 99 mg/dL  Hemoglobin V5I  Result  Value Ref Range   Hgb A1c MFr Bld 8.3 (H) <5.7 %   Mean Plasma Glucose 192 (H) <117 mg/dL  Glucose, capillary  Result Value Ref Range   Glucose-Capillary 235 (H) 70 - 99 mg/dL  Urinalysis, Routine w reflex microscopic  Result Value Ref Range   Color, Urine YELLOW YELLOW   APPearance CLEAR CLEAR   Specific Gravity, Urine 1.019 1.005 - 1.030   pH 5.5 5.0 - 8.0   Glucose, UA NEGATIVE NEGATIVE mg/dL   Hgb urine dipstick NEGATIVE NEGATIVE   Bilirubin Urine NEGATIVE NEGATIVE   Ketones, ur 15 (A) NEGATIVE mg/dL   Protein, ur NEGATIVE NEGATIVE mg/dL   Urobilinogen, UA 1.0 0.0 - 1.0 mg/dL   Nitrite NEGATIVE NEGATIVE   Leukocytes, UA NEGATIVE NEGATIVE  Glucose, capillary  Result Value Ref Range   Glucose-Capillary 154 (H) 70 - 99 mg/dL  I-Stat Chem 8, ED  Result Value Ref Range   Sodium 139 137 - 147 mEq/L   Potassium 4.2 3.7 - 5.3 mEq/L   Chloride 101 96 - 112 mEq/L   BUN 18 6 - 23 mg/dL   Creatinine, Ser 0.45 0.50 - 1.10 mg/dL   Glucose, Bld 409 (H) 70 - 99 mg/dL   Calcium, Ion 8.11 9.14 - 1.30 mmol/L   TCO2 24 0 - 100 mmol/L   Hemoglobin 13.3 12.0 - 15.0 g/dL   HCT 78.2 95.6 - 21.3 %  I-stat troponin, ED (not at [facility])  Result Value Ref Range   Troponin i, poc 0.00 0.00 - 0.08 ng/mL   Comment 3          CBG monitoring, ED  Result Value Ref Range   Glucose-Capillary 225 (H) 70 - 99 mg/dL    The neurologically relevant items on the patient's problem list were reviewed on today's visit.  Neurologic Examination  A problem focused neurological exam (12 or more points of the single system neurologic examination, vital signs counts as 1 point,  cranial nerves count for 8 points) was performed.  Blood pressure 125/69, pulse 83, height 5\' 2"  (1.575 m), weight 181 lb 6.4 oz (82.283 kg).  General - Well nourished, well developed, in no apparent distress.  Ophthalmologic - not able to see throgh.  Cardiovascular - Regular rate and rhythm with no murmur.  Mental Status -  Level of arousal and orientation to time, place, and person were intact. Language including expression, naming, repetition, comprehension was assessed and found intact.  Cranial Nerves II - XII - II - Visual field intact OU. III, IV, VI - Extraocular movements intact. V - Facial sensation intact bilaterally. VII - Facial movement intact bilaterally. VIII - Hearing & vestibular intact bilaterally. X - Palate elevates symmetrically. XI - Chin turning & shoulder shrug intact bilaterally. XII - Tongue protrusion intact.  Motor Strength - The patient's strength was normal in all extremities and pronator drift was absent.  Bulk was normal and fasciculations were absent.   Motor Tone - Muscle tone was assessed at the neck and appendages and was normal.  Reflexes - The patient's reflexes were 1+ in all extremities and she had no pathological reflexes.  Sensory - Light touch, temperature/pinprick were assessed and were normal.    Coordination - The patient had normal movements in the hands and feet with no ataxia or dysmetria.  Tremor was absent.  Gait and Station - slow walking with small stride, broad based gait with en bloc turns. Dix-Hallpike test negative.  Data reviewed: I personally reviewed the  images and agree with the radiology interpretations.  CT of the brain 08/04/2014 1. No acute intracranial findings. 2. Chronic small vessel disease.   MRI of the brain 08/05/2014 1. No acute intracranial abnormality. 2. Stable white matter disease and remote lacunar infarcts. This likely reflects the sequela of chronic microvascular ischemia. 3. Minimal fluid  in the right mastoid air cells.   MRA of the brain 08/05/2014 4. Tortuosity and beaded irregularity of the cervical right internal carotid artery suggesting fibromuscular dysplasia. 5. 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 6. No significant branch vessel disease.   Carotid Doppler No evidence of hemodynamically significant internal carotid artery stenosis. Vertebral artery flow is antegrade.   2D Echocardiogram EF 55-60% with no source of embolus.   EKG normal sinus rhythm.   A1C 8.4 and LDL 51  Assessment: As you may recall, she is a 81 y.o. Caucasian female with PMH of atrial flutter on Pradaxa, previous stroke, myocardial infarction, hypertension, hyperlipidemia and diabetes mellitus as well as migraine with aura, was admitted on 08/04/14 due to speech output difficulty. MRI no acute infarct and her symptoms per description more consistent with complicated migraine. She was discharged with continuing pradaxa and crestor for stroke prevention. Last visit, her complain was vertigo with turning in bed and unsteadiness after getting up with head motion, all short lasting, consistent with BPPV. Dix-Halpike test negative. Has yet to see PT for maneuver training.   Plan:  - continue pradaxa and crestor for stroke prevention - arrange to see PT for maneuver training. - Follow up with your primary care physician for stroke risk factor modification. Recommend maintain blood pressure goal <130/80, diabetes with hemoglobin A1c goal below 6.5% and lipids with LDL cholesterol goal below 70 mg/dL.  - check BP at home and avoid hypotension or fall. - RTC in 4 months.  No orders of the defined types were placed in this encounter.    Meds ordered this encounter  Medications  . VOLTAREN 1 % GEL    Sig: Take 1 application by mouth as needed.    Refill:  0  . cyclobenzaprine (FLEXERIL) 10 MG tablet    Sig: Take 10 mg by  mouth 3 (three) times daily as needed for muscle spasms.  Marland Kitchen acetaminophen (TYLENOL) 650 MG suppository    Sig: Place 650 mg rectally every 4 (four) hours as needed.    Patient Instructions  - continue pradaxa and crestor for stroke prevention - call PT again for BPPV maneuver training - discuss with PCP for BP monitoring device at home - Follow up with your primary care physician for stroke risk factor modification. Recommend maintain blood pressure goal <130/80, diabetes with hemoglobin A1c goal below 6.5% and lipids with LDL cholesterol goal below 70 mg/dL.  - check BP and glucose at home - avoid fall - follow up in 4 months.    Marvel Plan, MD PhD [facility] [address], [address] [place], [place] [address] [phone]

## 2015-02-23 NOTE — Patient Instructions (Signed)
-   continue pradaxa and crestor for stroke prevention - call PT again for BPPV maneuver training - discuss with PCP for BP monitoring device at home - Follow up with your primary care physician for stroke risk factor modification. Recommend maintain blood pressure goal <130/80, diabetes with hemoglobin A1c goal below 6.5% and lipids with LDL cholesterol goal below 70 mg/dL.  - check BP and glucose at home - avoid fall - follow up in 4 months.

## 2015-03-02 DIAGNOSIS — J309 Allergic rhinitis, unspecified: Secondary | ICD-10-CM | POA: Diagnosis not present

## 2015-03-02 DIAGNOSIS — E538 Deficiency of other specified B group vitamins: Secondary | ICD-10-CM | POA: Diagnosis not present

## 2015-03-13 ENCOUNTER — Ambulatory Visit: Payer: Medicare Other | Attending: Neurology | Admitting: Physical Therapy

## 2015-03-13 ENCOUNTER — Encounter: Payer: Self-pay | Admitting: Physical Therapy

## 2015-03-13 ENCOUNTER — Ambulatory Visit: Payer: Medicare Other | Admitting: Physical Therapy

## 2015-03-13 DIAGNOSIS — G43109 Migraine with aura, not intractable, without status migrainosus: Secondary | ICD-10-CM | POA: Diagnosis not present

## 2015-03-13 DIAGNOSIS — Z951 Presence of aortocoronary bypass graft: Secondary | ICD-10-CM | POA: Insufficient documentation

## 2015-03-13 DIAGNOSIS — I2510 Atherosclerotic heart disease of native coronary artery without angina pectoris: Secondary | ICD-10-CM | POA: Insufficient documentation

## 2015-03-13 DIAGNOSIS — Z8673 Personal history of transient ischemic attack (TIA), and cerebral infarction without residual deficits: Secondary | ICD-10-CM | POA: Diagnosis not present

## 2015-03-13 DIAGNOSIS — H8110 Benign paroxysmal vertigo, unspecified ear: Secondary | ICD-10-CM | POA: Diagnosis not present

## 2015-03-13 DIAGNOSIS — I10 Essential (primary) hypertension: Secondary | ICD-10-CM | POA: Diagnosis not present

## 2015-03-13 DIAGNOSIS — E119 Type 2 diabetes mellitus without complications: Secondary | ICD-10-CM | POA: Diagnosis not present

## 2015-03-13 DIAGNOSIS — Z7901 Long term (current) use of anticoagulants: Secondary | ICD-10-CM | POA: Diagnosis not present

## 2015-03-13 DIAGNOSIS — I4892 Unspecified atrial flutter: Secondary | ICD-10-CM | POA: Insufficient documentation

## 2015-03-13 NOTE — Therapy (Signed)
[facility] [address] [place], [place], [address] Phone: [phone]   Fax:  [phone]  Physical Therapy Evaluation  Patient Details  Name: [name] MRN: [id number] Date of Birth: 1934/08/29 Referring Provider:  Marvel Plan, MD  Encounter Date: 03/13/2015      PT End of Session - 03/13/15 1436    Visit Number 1  G1   Number of Visits 1   Date for PT Re-Evaluation 05/12/15   Authorization Type Medicare   Authorization Time Period 03-13-15 - 05-12-15   Authorization - Visit Number 1   Authorization - Number of Visits 1   PT Start Time 1313   PT Stop Time 1400   PT Time Calculation (min) 47 min      Past Medical History  Diagnosis Date  . Long term (current) use of anticoagulants 11/02/2013    Pradaxa   . Atrial flutter 11/02/2013  . Essential hypertension, benign 11/02/2013  . Coronary artery disease   . Stroke   . Diabetes mellitus without complication   . Acute MI   . Vertigo   . High cholesterol   . Heart disease     Past Surgical History  Procedure Laterality Date  . Cardiac surgery    . Coronary artery bypass graft  01/2005  . Lumbar disc surgery  09/2004    L1-L5    There were no vitals filed for this visit.  Visit Diagnosis:  BPPV (benign paroxysmal positional vertigo), unspecified laterality      Subjective Assessment - 03/13/15 1431    Symptoms Pt. reports no vertigo within past 4-5 months;  reports she had CVA in 2012; had episode of speech difficulty in Aug. 2015 - went to ED; was diagnosed with complicated migraine; pt. states she is referred to learn exercises to do should vertigo occur again   Currently in Pain? No/denies            [facility] PT Assessment - 03/13/15 1431    Assessment   Medical Diagnosis BPPV   Onset Date --  August 2015   Balance Screen   Has the patient fallen in the past 6 months No   Has the patient had a decrease in activity level because of a  fear of falling?  No   Is the patient reluctant to leave their home because of a fear of falling?  No            Vestibular Assessment - 03/13/15 1434    Vestibular Assessment   General Observation Pt. is a 81 year old lady ambulating independently without use of asst. device   Symptom Behavior   Type of Dizziness "Funny feeling in head"   Frequency of Dizziness reports no vertigo since 4-5 months ago   Duration of Dizziness few seconds   Aggravating Factors Rolling to right   Relieving Factors Lying supine   Dix-Hallpike Right   Dix-Hallpike Right Duration none   Dix-Hallpike Right Symptoms No nystagmus   Dix-Hallpike Left   Dix-Hallpike Left Duration none   Dix-Hallpike Left Symptoms No nystagmus   Positional Sensitivities   Up from Right Hallpike No dizziness   Up from Left Hallpike No dizziness     Self Care;  Pt. was given info on etiology of BPPV and instructed in Brandt-Daroff exercises; pt. was informed that  these exercises would not prevent the re-occurrence of another episode but would help to resolve the BPPV should  it re-occur in the future;  pt. Verbalized understanding                  PT Education - 03/24/2015 1436    Education provided Yes   Education Details BPPV info; Brandt-Daroff exercises   Person(s) Educated Patient   Methods Explanation;Demonstration;Handout   Comprehension Verbalized understanding;Returned demonstration                    Plan - 24-Mar-2015 1437    Clinical Impression Statement Pt. presents with no signs and no c/o vertigo at this time; states she was referred to PT to learn the exercises to do should BPPV re-occur in the future   Rehab Potential --  eval only   PT Frequency 1x / week   PT Duration Other (comment)  1 week   PT Treatment/Interventions Other (comment);ADLs/Self Care Home Management  initial evaluation   PT Next Visit Plan pt. was instructed to call for appt. should vertigo occur within  next 2 months; otherwise pt. will need a new referral to return to PT: pt. reports no vertigo at this time   PT Home Exercise Plan [name] was instructed for pt. to do should BPPV re-occur; informed pt. to call for appt. ASAP if exercises are not resolving the vertigo or if she is unable to do them at home   Consulted and Agree with Plan of Care Patient          G-Codes - March 24, 2015 1442    Functional Assessment Tool Used clinical judgment - no vertigo present at this time   Functional Limitation Other PT primary   Other PT Primary Current Status (U7253) At least 1 percent but less than 20 percent impaired, limited or restricted   Other PT Primary Goal Status (G6440) 0 percent impaired, limited or restricted       Problem List Patient Active Problem List   Diagnosis Date Noted  . BPPV (benign paroxysmal positional vertigo) 10/31/2014  . Complicated migraine 08/05/2014  . TIA (transient ischemic attack) 08/04/2014  . Coronary atherosclerosis of native coronary artery 11/02/2013    Class: Chronic  . Atrial flutter 11/02/2013    Class: Chronic  . Essential hypertension, benign 11/02/2013  . Long term current use of anticoagulant therapy 11/02/2013    Class: Acute    Canaan Holzer, Donavan Burnet, PT Mar 24, 2015, 4:31 PM  [facility] [address] [place], [place], [address] Phone: [phone]   Fax:  [phone]

## 2015-03-13 NOTE — Patient Instructions (Addendum)
Benign Positional Vertigo Vertigo means you feel like you or your surroundings are moving when they are not. Benign positional vertigo is the most common form of vertigo. Benign means that the cause of your condition is not serious. Benign positional vertigo is more common in older adults. CAUSES  Benign positional vertigo is the result of an upset in the labyrinth system. This is an area in the middle ear that helps control your balance. This may be caused by a viral infection, head injury, or repetitive motion. However, often no specific cause is found. SYMPTOMS  Symptoms of benign positional vertigo occur when you move your head or eyes in different directions. Some of the symptoms may include:  Loss of balance and falls.  Vomiting.  Blurred vision.  Dizziness.  Nausea.  Involuntary eye movements (nystagmus). DIAGNOSIS  Benign positional vertigo is usually diagnosed by physical exam. If the specific cause of your benign positional vertigo is unknown, your caregiver may perform imaging tests, such as magnetic resonance imaging (MRI) or computed tomography (CT). TREATMENT  Your caregiver may recommend movements or procedures to correct the benign positional vertigo. Medicines such as meclizine, benzodiazepines, and medicines for nausea may be used to treat your symptoms. In rare cases, if your symptoms are caused by certain conditions that affect the inner ear, you may need surgery. HOME CARE INSTRUCTIONS   Follow your caregiver's instructions.  Move slowly. Do not make sudden body or head movements.  Avoid driving.  Avoid operating heavy machinery.  Avoid performing any tasks that would be dangerous to you or others during a vertigo episode.  Drink enough fluids to keep your urine clear or pale yellow. SEEK IMMEDIATE MEDICAL CARE IF:   You develop problems with walking, weakness, numbness, or using your arms, hands, or legs.  You have difficulty speaking.  You develop  severe headaches.  Your nausea or vomiting continues or gets worse.  You develop visual changes.  Your family or friends notice any behavioral changes.  Your condition gets worse.  You have a fever.  You develop a stiff neck or sensitivity to light. MAKE SURE YOU:   Understand these instructions.  Will watch your condition.  Will get help right away if you are not doing well or get worse. Document Released: 09/23/2006 Document Revised: 03/09/2012 Document Reviewed: 09/05/2011 [facility] Patient Information 2015 [place], [place]. This information is not intended to replace advice given to you by your health care provider. Make sure you discuss any questions you have with your health care provider. Benign Positional Vertigo Vertigo means you feel like you or your surroundings are moving when they are not. Benign positional vertigo is the most common form of vertigo. Benign means that the cause of your condition is not serious. Benign positional vertigo is more common in older adults. CAUSES  Benign positional vertigo is the result of an upset in the labyrinth system. This is an area in the middle ear that helps control your balance. This may be caused by a viral infection, head injury, or repetitive motion. However, often no specific cause is found. SYMPTOMS  Symptoms of benign positional vertigo occur when you move your head or eyes in different directions. Some of the symptoms may include:  Loss of balance and falls.  Vomiting.  Blurred vision.  Dizziness.  Nausea.  Involuntary eye movements (nystagmus). DIAGNOSIS  Benign positional vertigo is usually diagnosed by physical exam. If the specific cause of your benign positional vertigo is unknown, your caregiver may perform imaging  tests, such as magnetic resonance imaging (MRI) or computed tomography (CT). TREATMENT  Your caregiver may recommend movements or procedures to correct the benign positional vertigo. Medicines such as  meclizine, benzodiazepines, and medicines for nausea may be used to treat your symptoms. In rare cases, if your symptoms are caused by certain conditions that affect the inner ear, you may need surgery. HOME CARE INSTRUCTIONS   Follow your caregiver's instructions.  Move slowly. Do not make sudden body or head movements.  Avoid driving.  Avoid operating heavy machinery.  Avoid performing any tasks that would be dangerous to you or others during a vertigo episode.  Drink enough fluids to keep your urine clear or pale yellow. SEEK IMMEDIATE MEDICAL CARE IF:   You develop problems with walking, weakness, numbness, or using your arms, hands, or legs.  You have difficulty speaking.  You develop severe headaches.  Your nausea or vomiting continues or gets worse.  You develop visual changes.  Your family or friends notice any behavioral changes.  Your condition gets worse.  You have a fever.  You develop a stiff neck or sensitivity to light. MAKE SURE YOU:   Understand these instructions.  Will watch your condition.  Will get help right away if you are not doing well or get worse. Document Released: 09/23/2006 Document Revised: 03/09/2012 Document Reviewed: 09/05/2011 [facility] Patient Information 2015 [place], [place]. This information is not intended to replace advice given to you by your health care provider. Make sure you discuss any questions you have with your health care provider. Pt. Was instructed in Brandt-Daroff exercises

## 2015-03-14 ENCOUNTER — Encounter: Payer: Medicare Other | Admitting: Physical Therapy

## 2015-03-16 DIAGNOSIS — L4059 Other psoriatic arthropathy: Secondary | ICD-10-CM | POA: Diagnosis not present

## 2015-03-16 DIAGNOSIS — L408 Other psoriasis: Secondary | ICD-10-CM | POA: Diagnosis not present

## 2015-03-16 DIAGNOSIS — Z79899 Other long term (current) drug therapy: Secondary | ICD-10-CM | POA: Diagnosis not present

## 2015-03-16 DIAGNOSIS — M25561 Pain in right knee: Secondary | ICD-10-CM | POA: Diagnosis not present

## 2015-03-22 DIAGNOSIS — L570 Actinic keratosis: Secondary | ICD-10-CM | POA: Diagnosis not present

## 2015-03-22 DIAGNOSIS — Z85828 Personal history of other malignant neoplasm of skin: Secondary | ICD-10-CM | POA: Diagnosis not present

## 2015-03-22 DIAGNOSIS — L814 Other melanin hyperpigmentation: Secondary | ICD-10-CM | POA: Diagnosis not present

## 2015-03-22 DIAGNOSIS — I788 Other diseases of capillaries: Secondary | ICD-10-CM | POA: Diagnosis not present

## 2015-03-23 DIAGNOSIS — H9193 Unspecified hearing loss, bilateral: Secondary | ICD-10-CM | POA: Diagnosis not present

## 2015-04-03 DIAGNOSIS — E538 Deficiency of other specified B group vitamins: Secondary | ICD-10-CM | POA: Diagnosis not present

## 2015-05-08 DIAGNOSIS — E538 Deficiency of other specified B group vitamins: Secondary | ICD-10-CM | POA: Diagnosis not present

## 2015-06-06 DIAGNOSIS — G8929 Other chronic pain: Secondary | ICD-10-CM | POA: Diagnosis not present

## 2015-06-06 DIAGNOSIS — Z794 Long term (current) use of insulin: Secondary | ICD-10-CM | POA: Diagnosis not present

## 2015-06-06 DIAGNOSIS — I10 Essential (primary) hypertension: Secondary | ICD-10-CM | POA: Diagnosis not present

## 2015-06-06 DIAGNOSIS — E11319 Type 2 diabetes mellitus with unspecified diabetic retinopathy without macular edema: Secondary | ICD-10-CM | POA: Diagnosis not present

## 2015-06-06 DIAGNOSIS — M25562 Pain in left knee: Secondary | ICD-10-CM | POA: Diagnosis not present

## 2015-06-09 DIAGNOSIS — E538 Deficiency of other specified B group vitamins: Secondary | ICD-10-CM | POA: Diagnosis not present

## 2015-06-22 ENCOUNTER — Ambulatory Visit (INDEPENDENT_AMBULATORY_CARE_PROVIDER_SITE_OTHER): Payer: Medicare Other | Admitting: Internal Medicine

## 2015-06-22 ENCOUNTER — Encounter: Payer: Self-pay | Admitting: Internal Medicine

## 2015-06-22 ENCOUNTER — Telehealth: Payer: Self-pay | Admitting: Interventional Cardiology

## 2015-06-22 VITALS — BP 118/52 | HR 74 | Ht 62.0 in | Wt 181.1 lb

## 2015-06-22 DIAGNOSIS — R079 Chest pain, unspecified: Secondary | ICD-10-CM | POA: Diagnosis not present

## 2015-06-22 LAB — CBC
HCT: 40.2 % (ref 36.0–46.0)
HEMOGLOBIN: 13.5 g/dL (ref 12.0–15.0)
MCHC: 33.5 g/dL (ref 30.0–36.0)
MCV: 89.3 fl (ref 78.0–100.0)
Platelets: 185 10*3/uL (ref 150.0–400.0)
RBC: 4.49 Mil/uL (ref 3.87–5.11)
RDW: 13.2 % (ref 11.5–15.5)
WBC: 7.3 10*3/uL (ref 4.0–10.5)

## 2015-06-22 LAB — BASIC METABOLIC PANEL
BUN: 19 mg/dL (ref 6–23)
CO2: 26 mEq/L (ref 19–32)
CREATININE: 0.83 mg/dL (ref 0.40–1.20)
Calcium: 9.8 mg/dL (ref 8.4–10.5)
Chloride: 104 mEq/L (ref 96–112)
GFR: 70.4 mL/min (ref >60.00)
Glucose, Bld: 184 mg/dL — ABNORMAL HIGH (ref 70–99)
POTASSIUM: 5 meq/L (ref 3.5–5.1)
Sodium: 137 mEq/L (ref 135–145)

## 2015-06-22 LAB — TROPONIN I: TNIDX: 0.01 ug/l (ref 0.00–0.06)

## 2015-06-22 NOTE — Patient Instructions (Signed)
Your physician recommends that you continue on your current medications as directed. Please refer to the Current Medication list given to you today. Your physician recommends that you return for lab work in: today (CBC, BMET, TROPONIN)

## 2015-06-22 NOTE — Telephone Encounter (Signed)
Pt states that she started having CP yesterday a little after 4pm that lasted 30-61mins. Pt states that she took 3 nitros and pain didn't go completely away for a short period of time. Pt states her son came over and checked her BP and it was 127/60. Pt states that she feels chest pressure at this time but denies any pain. Pt denied SOB, nausea, dizziness and sweating at time of CP. Pt denies any CP at this time. Had pt check vitals while on the phone: BP 139/64, HR 72. Pt states that she does not want to go to the ED, she wants to come in today. Spoke with Dr. Harrington Challenger, DOD and she said to have pt come in for OV today with her. Scheduled pt for 11:15AM with Dr. Harrington Challenger. Spoke with pt and informed her of appt. Pt verbalized understanding and was in agreement.

## 2015-06-22 NOTE — Progress Notes (Signed)
Cardiology Office Note   Date:  06/22/2015   ID:  [name], DOB 1934-05-31, MRN [id number]  PCP:  Ginette Otto, MD  Cardiologist:   Princella Ion chief complaint on file.     History of Present Illness: [name] is a 81 y.o. female with a history of vetigo, CAD, PAF, HTN  She is folllowed by H Stoneking and H Smith   Pt called in today  Had CP yesterday  Took 3 NTG  Didn't ease completely  Left ewit pressure   Pt describes a pain  Cramping  INto jaw    Sitting    Stressful day   Weht away completely   Last night after ate  Went to [employer]  [facility]  Discomfort gone   Got up once to BR AM  Watering  Flowers   A lttie pressure     Last cath in 2012   Current Outpatient Prescriptions  Medication Sig Dispense Refill  . acetaminophen (TYLENOL) 650 MG suppository Place 650 mg rectally every 4 (four) hours as needed.    . calcium carbonate (TUMS EX) 750 MG chewable tablet Chew 1 tablet by mouth daily.    . carvedilol (COREG) 25 MG tablet TAKE (1) TABLET TWICE A DAY WITH FOOD. 60 tablet 11  . chlorpheniramine (CHLOR-TRIMETON) 4 MG tablet Take 4 mg by mouth as needed for allergies.    . Cholecalciferol (VITAMIN D) 1000 UNITS capsule Take 1,000 Units by mouth daily.     . clonazePAM (KLONOPIN) 0.5 MG tablet Take 0.25 mg by mouth at bedtime as needed (for sleep).     . Cyanocobalamin (VITAMIN B 12 PO) Inject 1,000 mcg as directed every 30 (thirty) days.    . ENBREL 50 MG/ML injection Inject 50 mg as directed every Tuesday.     . fluticasone (FLONASE) 50 MCG/ACT nasal spray Place 1 spray into both nostrils daily.    Marland Kitchen glipiZIDE (GLIPIZIDE XL) 5 MG 24 hr tablet Take 2 tablets (10 mg total) by mouth daily with breakfast. 30 tablet 0  . insulin glargine (LANTUS) 100 UNIT/ML injection Inject 0.5 mLs (50 Units total) into the skin at bedtime. (Patient taking differently: Inject 42 Units into the skin at bedtime. ) 10 mL 11  . metFORMIN (GLUCOPHAGE) 500 MG tablet Take 1,000 mg  by mouth 2 (two) times daily.    . Multiple Vitamins-Minerals (MULTIVITAMIN PO) Take 1 tablet by mouth daily.     . nitroGLYCERIN (NITROSTAT) 0.4 MG SL tablet Place 1 tablet (0.4 mg total) under the tongue as needed for chest pain. 25 tablet 3  . nystatin-triamcinolone ointment (MYCOLOG) Apply 1 application topically as directed.   0  . PRADAXA 150 MG CAPS Take 150 mg by mouth 2 (two) times daily.     . ramipril (ALTACE) 5 MG capsule TAKE (1) CAPSULE TWICE DAILY. 60 capsule 6  . rosuvastatin (CRESTOR) 10 MG tablet Take 10 mg by mouth at bedtime.      No current facility-administered medications for this visit.    Allergies:   Penicillins and Atorvastatin   Past Medical History  Diagnosis Date  . Long term (current) use of anticoagulants 11/02/2013    Pradaxa   . Atrial flutter 11/02/2013  . Essential hypertension, benign 11/02/2013  . Coronary artery disease   . Stroke   . Diabetes mellitus without complication   . Acute MI   . Vertigo   . High cholesterol   . Heart disease  Past Surgical History  Procedure Laterality Date  . Cardiac surgery    . Coronary artery bypass graft  01/2005  . Lumbar disc surgery  09/2004    L1-L5     Social History:  The patient  reports that she has never smoked. She has never used smokeless tobacco. She reports that she does not drink alcohol or use illicit drugs.   Family History:  The patient's family history is not on file.    ROS:  Please see the history of present illness. All other systems are reviewed and  Negative to the above problem except as noted.    PHYSICAL EXAM: VS:  BP 118/52 mmHg  Pulse 74  Ht 5\' 2"  (1.575 m)  Wt 181 lb 1.9 oz (82.155 kg)  BMI 33.12 kg/m2  GEN: Well nourished, well developed, in no acute distress HEENT: normal Neck: no JVD, carotid bruits, or masses Cardiac: RRR; no murmurs, rubs, or gallops,no edema  Respiratory:  clear to auscultation bilaterally, normal work of breathing GI: soft, nontender,  nondistended, + BS  No hepatomegaly  MS: no deformity Moving all extremities   Skin: warm and dry, no rash Neuro:  Strength and sensation are intact Psych: euthymic mood, full affect   EKG:  EKG is ordered today.  SR 74 bpm  Septal infarct  IWMI   (old)    Lipid Panel    Component Value Date/Time   CHOL 137 08/05/2014 0453   TRIG 105 08/05/2014 0453   HDL 65 08/05/2014 0453   CHOLHDL 2.1 08/05/2014 0453   VLDL 21 08/05/2014 0453   LDLCALC 51 08/05/2014 0453      Wt Readings from Last 3 Encounters:  06/22/15 181 lb 1.9 oz (82.155 kg)  02/23/15 181 lb 6.4 oz (82.283 kg)  11/03/14 186 lb (84.369 kg)      ASSESSMENT AND PLAN:  1.  CP pressure/cramping yesterday     Hx CAD, CABG in 2006 I would recomm checking CBC Troponin and BMET She is comfortable now  Will review with Mendel Ryder I am not convinced of any active angina   She was instructed to go to ER if recurs and is not relieved with NTG  Some decision will need to be made with testing given possible knee surgery (Pt says stress test in past missed problems)  Would give ASA today until labs back  Discuss continuation with H SMith 2.  Atrial fib  Continue anticoagulation.   3.  HL  Keep on statin  Disposition:   FU will be based on test results    Signed, Dietrich Pates, MD  06/22/2015 12:13 PM    [facility] [address] [place], [place], [place]  [address] Phone: [phone]; Fax: [phone]

## 2015-06-22 NOTE — Telephone Encounter (Signed)
Pt c/o of Chest Pain: STAT if CP now or developed within 24 hours  1. Are you having CP right now? No  2. Are you experiencing any other symptoms (ex. SOB, nausea, vomiting, sweating)? Sweating  3. How long have you been experiencing CP? Started yesterday  4. Is your CP continuous or coming and going? Continuous  5. Have you taken Nitroglycerin? Took 3 Nitro ?

## 2015-06-26 ENCOUNTER — Other Ambulatory Visit: Payer: Self-pay

## 2015-06-27 DIAGNOSIS — M1712 Unilateral primary osteoarthritis, left knee: Secondary | ICD-10-CM | POA: Diagnosis not present

## 2015-06-28 ENCOUNTER — Telehealth: Payer: Self-pay | Admitting: Interventional Cardiology

## 2015-06-28 NOTE — Telephone Encounter (Signed)
Attempted to call pt , no answer service. Will call pt later.

## 2015-06-28 NOTE — Telephone Encounter (Signed)
New problem    Pt want to know from her 6.23.16 visit does she need to do anything different concerning her care for her chest pain. Dr Harrington Challenger stated she would talk to Dr Tamala Julian to discuss. Please advise.

## 2015-06-28 NOTE — Telephone Encounter (Signed)
Pt is aware that Dr. Harrington Challenger spoke with Dr. Tamala Julian about pt and regarding chest signs and symptoms and the troponin lab results. Pt is aware that the lab work was normal. Pt states that she needs to have knee surgery soon and that her daughter in law is getting in touch with the surgeon so they   call our office for surgical clearance.

## 2015-07-10 ENCOUNTER — Ambulatory Visit: Payer: Medicare Other | Admitting: Neurology

## 2015-07-10 DIAGNOSIS — E538 Deficiency of other specified B group vitamins: Secondary | ICD-10-CM | POA: Diagnosis not present

## 2015-07-11 ENCOUNTER — Telehealth: Payer: Self-pay

## 2015-07-11 DIAGNOSIS — R079 Chest pain, unspecified: Secondary | ICD-10-CM

## 2015-07-11 DIAGNOSIS — Z01818 Encounter for other preprocedural examination: Secondary | ICD-10-CM

## 2015-07-11 DIAGNOSIS — I25119 Atherosclerotic heart disease of native coronary artery with unspecified angina pectoris: Secondary | ICD-10-CM

## 2015-07-11 NOTE — Telephone Encounter (Signed)
Pt given verbal pre-procedure instructions. Pt is to hold diabetics meds the morning of test. Pt verbalized understanding

## 2015-07-11 NOTE — Telephone Encounter (Signed)
Pt aware per Dr.Smith she would need to have a lexiscan. Prior to being cl;earing for her upcoming knee replacement sx. Adv pt a scheduler from our office will call her to schedule  Pt will call back to get pre-procedure instructions

## 2015-07-13 ENCOUNTER — Encounter: Payer: Self-pay | Admitting: Interventional Cardiology

## 2015-07-17 DIAGNOSIS — M179 Osteoarthritis of knee, unspecified: Secondary | ICD-10-CM | POA: Diagnosis not present

## 2015-07-17 DIAGNOSIS — Z794 Long term (current) use of insulin: Secondary | ICD-10-CM | POA: Diagnosis not present

## 2015-07-17 DIAGNOSIS — I10 Essential (primary) hypertension: Secondary | ICD-10-CM | POA: Diagnosis not present

## 2015-07-17 DIAGNOSIS — E11319 Type 2 diabetes mellitus with unspecified diabetic retinopathy without macular edema: Secondary | ICD-10-CM | POA: Diagnosis not present

## 2015-07-20 ENCOUNTER — Telehealth (HOSPITAL_COMMUNITY): Payer: Self-pay

## 2015-07-20 NOTE — Telephone Encounter (Signed)
Patient given detailed instructions per Myocardial Perfusion Study Information Sheet for test on 07-25-2015 at 0915. Patient Notified to arrive 15 minutes early, and that it is imperative to arrive on time for appointment to keep from having the test rescheduled. Patient verbalized understanding. Oletta Lamas, Mertha Clyatt A

## 2015-07-25 ENCOUNTER — Ambulatory Visit (HOSPITAL_COMMUNITY): Payer: Medicare Other | Attending: Interventional Cardiology

## 2015-07-25 DIAGNOSIS — R079 Chest pain, unspecified: Secondary | ICD-10-CM | POA: Diagnosis not present

## 2015-07-25 DIAGNOSIS — Z01818 Encounter for other preprocedural examination: Secondary | ICD-10-CM | POA: Insufficient documentation

## 2015-07-25 DIAGNOSIS — I25119 Atherosclerotic heart disease of native coronary artery with unspecified angina pectoris: Secondary | ICD-10-CM | POA: Diagnosis not present

## 2015-07-25 LAB — MYOCARDIAL PERFUSION IMAGING
CHL CUP NUCLEAR SDS: 4
CHL CUP NUCLEAR SRS: 0
CHL CUP NUCLEAR SSS: 4
CHL CUP RESTING HR STRESS: 73 {beats}/min
CSEPPHR: 90 {beats}/min
LV dias vol: 60 mL
LV sys vol: 17 mL
NUC STRESS TID: 0.9
RATE: 0.24

## 2015-07-25 MED ORDER — TECHNETIUM TC 99M SESTAMIBI GENERIC - CARDIOLITE
32.9000 | Freq: Once | INTRAVENOUS | Status: AC | PRN
  Administered 2015-07-25: 32.9 via INTRAVENOUS

## 2015-07-25 MED ORDER — TECHNETIUM TC 99M SESTAMIBI GENERIC - CARDIOLITE
10.9000 | Freq: Once | INTRAVENOUS | Status: AC | PRN
  Administered 2015-07-25: 10.9 via INTRAVENOUS

## 2015-07-25 MED ORDER — REGADENOSON 0.4 MG/5ML IV SOLN
0.4000 mg | Freq: Once | INTRAVENOUS | Status: AC
  Administered 2015-07-25: 0.4 mg via INTRAVENOUS

## 2015-07-26 ENCOUNTER — Telehealth: Payer: Self-pay

## 2015-07-26 NOTE — Telephone Encounter (Signed)
-----   Message from Belva Crome, MD sent at 07/25/2015  8:36 PM EDT ----- This test was essentially normal and considered to be low risk. She is therefore cleared for upcoming orthopedic surgery by Dr.Aluisio. Please notify Dr. Wynelle Link that the patient has been cleared for the upcoming surgery.

## 2015-07-26 NOTE — Telephone Encounter (Signed)
Pt aware of myoview results. This test was essentially normal and considered to be low risk. She is therefore cleared for upcoming orthopedic surgery by Dr.Aluisio. Please notify Dr. Wynelle Link that the patient has been cleared for the upcoming surgery.       Results fwd to Dr.Alusio. Pt verbalized understanding.

## 2015-07-31 NOTE — Telephone Encounter (Signed)
Signed cardiac clearance place in MR nurse fax box to be faxed to [facility]

## 2015-08-09 DIAGNOSIS — Z0100 Encounter for examination of eyes and vision without abnormal findings: Secondary | ICD-10-CM | POA: Diagnosis not present

## 2015-08-09 DIAGNOSIS — E11329 Type 2 diabetes mellitus with mild nonproliferative diabetic retinopathy without macular edema: Secondary | ICD-10-CM | POA: Diagnosis not present

## 2015-08-09 DIAGNOSIS — H2513 Age-related nuclear cataract, bilateral: Secondary | ICD-10-CM | POA: Diagnosis not present

## 2015-08-09 DIAGNOSIS — H25013 Cortical age-related cataract, bilateral: Secondary | ICD-10-CM | POA: Diagnosis not present

## 2015-08-10 ENCOUNTER — Telehealth: Payer: Self-pay | Admitting: Interventional Cardiology

## 2015-08-10 NOTE — Telephone Encounter (Signed)
Pt aware Per Leory Plowman She should hold Pradaxa 48 hrs prior to her surgery. She should resume it when the surgeon thinks its safe. Pt verbalized understanding.

## 2015-08-10 NOTE — Telephone Encounter (Signed)
New Message      Pt calling stating that Dr. Tamala Julian already gave her surgical clearance for upcoming surgery on 09/11/15 and pt just needs to know how long before her surgery does she need to stop taking her Pradaxa. Please call back and advise.

## 2015-08-11 DIAGNOSIS — E538 Deficiency of other specified B group vitamins: Secondary | ICD-10-CM | POA: Diagnosis not present

## 2015-08-16 DIAGNOSIS — M1712 Unilateral primary osteoarthritis, left knee: Secondary | ICD-10-CM | POA: Diagnosis not present

## 2015-08-21 ENCOUNTER — Encounter: Payer: Self-pay | Admitting: Neurology

## 2015-08-21 ENCOUNTER — Ambulatory Visit (INDEPENDENT_AMBULATORY_CARE_PROVIDER_SITE_OTHER): Payer: Medicare Other | Admitting: Neurology

## 2015-08-21 ENCOUNTER — Other Ambulatory Visit: Payer: Self-pay | Admitting: Interventional Cardiology

## 2015-08-21 VITALS — BP 110/62 | HR 72 | Ht 62.0 in | Wt 183.0 lb

## 2015-08-21 DIAGNOSIS — I10 Essential (primary) hypertension: Secondary | ICD-10-CM

## 2015-08-21 DIAGNOSIS — I4892 Unspecified atrial flutter: Secondary | ICD-10-CM | POA: Diagnosis not present

## 2015-08-21 DIAGNOSIS — I25119 Atherosclerotic heart disease of native coronary artery with unspecified angina pectoris: Secondary | ICD-10-CM | POA: Diagnosis not present

## 2015-08-21 DIAGNOSIS — H8110 Benign paroxysmal vertigo, unspecified ear: Secondary | ICD-10-CM

## 2015-08-21 DIAGNOSIS — G43909 Migraine, unspecified, not intractable, without status migrainosus: Secondary | ICD-10-CM | POA: Diagnosis not present

## 2015-08-21 DIAGNOSIS — G43109 Migraine with aura, not intractable, without status migrainosus: Secondary | ICD-10-CM

## 2015-08-21 NOTE — Patient Instructions (Signed)
-   continue pradaxa and crestor for stroke prevention - resume the exercise if vertigo occurs - Follow up with your primary care physician for stroke risk factor modification. Recommend maintain blood pressure goal <130/80, diabetes with hemoglobin A1c goal below 6.5% and lipids with LDL cholesterol goal below 70 mg/dL.  - check BP and glucose at home. - follow up as needed.

## 2015-08-21 NOTE — Progress Notes (Signed)
STROKE NEUROLOGY FOLLOW UP NOTE  NAME: [name] DOB: 12-04-1934  REASON FOR VISIT: stroke follow up HISTORY FROM: pt and chart  Today we had the pleasure of seeing [name] in follow-up at our Neurology Clinic. Pt was accompanied by daughter-in-law.   History Summary HEART [name] is an 81 y.o. female history of atrial flutter on Pradaxa, previous stroke, myocardial infarction, hypertension, hyperlipidemia and diabetes mellitus, was admitted on 08/04/14 due to speech output difficulty. Patient had difficulty formulating words. As well she had problems with not being able to type password to log into her computer. She had mild numbness involving the upper right face at the onset of her symptoms. Symptoms completely resolved shortly after arriving in the emergency room. CT scan of the head showed no acute intracranial abnormality. NIH stroke score was 0 at the time of this evaluation. Patient was not administered TPA secondary to deficits resolved; on anticoagulation with Pradaxa. She had Afib /flutter and on pradaxa. She took it faithfully.   She stated that she had hx of migraine and last one was several years ago. For the migraine HA, she will have photophobia, visual changes with wavy lines. She started to have HA during the episode of speech difficulty, right frontal but not as bad as previous migraine but she had same wavy lines as usual. Episodes of speech difficulty and right face numbness resolved along with resolution of HA.   MRI showed no acute infarct and her episode was felt to be complicated migraine. Low suspicious for TIA due to atypical presentation for stroke and also pt is on anticoagulation. She was discharged with pradaxa and continued on crestor.   10/31/14 follow up - the patient has been doing well from stroke prospective. However, she came in with new complains. She stated that for the last several years, she had episodic vertigo. She would just sitting in  chair and all of sudden she would feel vertigo, room spinning, N/V, lasting about 5 min and ease off some, she would go to bed rest and second day she was back to baseline. It would happen 1-2 per year, and no significant treatment needed.   For the last 2-3 weeks, she had episode of room spinning when she was in bed turning side by side, the episodes lasted less than one minute. When she got up after the episode over, she would feel somehow wobbly with head movement, had to hold on things for walking, unsteady, off balance associated with head motion, not vertigo, lasting 10-15 min and it resolved. This could happen several days in a row. For the last one week though, she was doing fine without episodes. She denies any weakness, numbness, LOC, speech difficulty or vision changes. She stated that she has ear ringing for years as well as hearing loss requiring hearing aids.  Her BP was 105/62 today in clinic. Denies any dizziness, blurry vision.  02/23/15 follow up - she was doing well. There is no vertigo episodes. She has not yet to see PT for BPPV maneuver yet as her husband had hospitalization and surgery from Nov to Jan. She is going to arrange for PT appointment soon. She also had a couple of migraine aura with visual changes but no headache or neurological changes. She is still on pradaxa and her BP today in clinic 125/69.   Interval History During the interval time, she has been doing well. She followed with PT for BPPV maneuver training but so far there  is no recurrence of BPPV. She had one episode of imbalance on quick turning two weeks ago, no vertigo and no fall. She is going to have left knee surgery soon for left knee pain. She also saw PCP on 06/22/15 for chest pain, work up showed no cardiac etiology, considering esophagus spasm at this time. His BP 110/62 today and she stated that her glucose is good with lantus 44 units but her last A1C was 7.7.  REVIEW OF SYSTEMS: Full 14 system review of  systems performed and notable only for those listed below and in HPI above, all others are negative:  Constitutional: N/A  Cardiovascular: swelling in legs  Ear/Nose/Throat: hearing loss, ringing in ears  Skin: N/A  Eyes: N/A  Respiratory: N/A  Gastroitestinal: constipation  Genitourinary: N/A Hematology/Lymphatic: N/A  Endocrine: N/A  Musculoskeletal: joint pain, back pain, walking difficulty, joint swelling Allergy/Immunology: N/A  Neurological: N/A  Psychiatric: N/A Sleep: frequent waking, daytime sleepiness, snoring  The following represents the patient's updated allergies and side effects list: Allergies  Allergen Reactions  . Codeine Nausea And Vomiting  . Penicillins Swelling and Other (See Comments)    Swelling of tongue  . Atorvastatin     Muscle soreness    Labs since last visit of relevance include the following: Results for orders placed or performed in visit on 07/25/15  Myocardial Perfusion Imaging  Result Value Ref Range   Rest HR 73 bpm   Rest BP 130/56 mmHg   Exercise duration (min)  min   Exercise duration (sec)  sec   Estimated workload  METS   Peak HR 90 bpm   Peak BP 141/42 mmHg   MPHR  bpm   Percent HR  %   RPE     LV Systolic Volume 17 mL   TID 0.90    LV Diastolic Volume 60 mL   LHR 0.24    SSS 4    SRS 0    SDS 4     The neurologically relevant items on the patient's problem list were reviewed on today's visit.  Neurologic Examination  A problem focused neurological exam (12 or more points of the single system neurologic examination, vital signs counts as 1 point, cranial nerves count for 8 points) was performed.  Blood pressure 110/62, pulse 72, height 5\' 2"  (1.575 m), weight 183 lb (83.008 kg).  General - Well nourished, well developed, in no apparent distress.  Ophthalmologic - not able to see throgh.  Cardiovascular - Regular rate and rhythm with no murmur.  Mental Status -  Level of arousal and orientation to time, place,  and person were intact. Language including expression, naming, repetition, comprehension was assessed and found intact.  Cranial Nerves II - XII - II - Visual field intact OU. III, IV, VI - Extraocular movements intact. V - Facial sensation intact bilaterally. VII - Facial movement intact bilaterally. VIII - Hearing & vestibular intact bilaterally. X - Palate elevates symmetrically. XI - Chin turning & shoulder shrug intact bilaterally. XII - Tongue protrusion intact.  Motor Strength - The patient's strength was normal in all extremities and pronator drift was absent.  Bulk was normal and fasciculations were absent.   Motor Tone - Muscle tone was assessed at the neck and appendages and was normal.  Reflexes - The patient's reflexes were 1+ in all extremities and she had no pathological reflexes.  Sensory - Light touch, temperature/pinprick were assessed and were normal.    Coordination - The patient had normal  movements in the hands and feet with no ataxia or dysmetria.  Tremor was absent.  Gait and Station - slow walking with small stride, broad based gait with en bloc turns. Dix-Hallpike test negative.  Data reviewed: I personally reviewed the images and agree with the radiology interpretations.  CT of the brain 08/04/2014 1. No acute intracranial findings. 2. Chronic small vessel disease.   MRI of the brain 08/05/2014 1. No acute intracranial abnormality. 2. Stable white matter disease and remote lacunar infarcts. This likely reflects the sequela of chronic microvascular ischemia. 3. Minimal fluid in the right mastoid air cells.   MRA of the brain 08/05/2014 4. Tortuosity and beaded irregularity of the cervical right internal carotid artery suggesting fibromuscular dysplasia. 5. 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 6. No significant branch vessel disease.   Carotid Doppler No evidence of  hemodynamically significant internal carotid artery stenosis. Vertebral artery flow is antegrade.   2D Echocardiogram EF 55-60% with no source of embolus.   EKG normal sinus rhythm.   Stress test 07/25/15 - Nuclear stress EF: 72%.  The study is normal.  This is a low risk study. The left ventricular ejection fraction is normal (55-65%).  A1C 8.4 and LDL 51, last check was 7.7 as per pt  Assessment: As you may recall, she is a 81 y.o. Caucasian female with PMH of atrial flutter on Pradaxa, previous stroke, myocardial infarction, hypertension, hyperlipidemia and diabetes mellitus as well as migraine with aura, was admitted on 08/04/14 due to speech output difficulty. MRI no acute infarct and her symptoms per description more consistent with complicated migraine. She was discharged with continuing pradaxa and crestor for stroke prevention. She has complains of vertigo with turning in bed and unsteadiness after getting up with head motion, all short lasting, consistent with BPPV. Dix-Halpike test negative. Received PT for maneuver training. Doing well during the interval time.  Plan:  - continue pradaxa and crestor for stroke prevention - resume the BPPV maneuver if vertigo occurs - Follow up with your primary care physician for stroke risk factor modification. Recommend maintain blood pressure goal <130/80, diabetes with hemoglobin A1c goal below 6.5% and lipids with LDL cholesterol goal below 70 mg/dL.  - check BP and glucose at home. - RTC PRN.  No orders of the defined types were placed in this encounter.    Meds ordered this encounter  Medications  . DISCONTD: LANTUS SOLOSTAR 100 UNIT/ML Solostar Pen    Sig:     Refill:  4    Patient Instructions  - continue pradaxa and crestor for stroke prevention - resume the exercise if vertigo occurs - Follow up with your primary care physician for stroke risk factor modification. Recommend maintain blood pressure goal <130/80, diabetes  with hemoglobin A1c goal below 6.5% and lipids with LDL cholesterol goal below 70 mg/dL.  - check BP and glucose at home. - follow up as needed.    Marvel Plan, MD PhD [facility] [address], [address] [place], [place] [address] [phone]

## 2015-08-22 ENCOUNTER — Ambulatory Visit: Payer: Self-pay | Admitting: Orthopedic Surgery

## 2015-08-22 NOTE — Progress Notes (Signed)
Preoperative surgical orders have been place into the Epic hospital system for KEMYA SHED on 08/22/2015, 12:33 PM  by Mickel Crow for surgery on 09/11/2015.  Preop Total Knee orders including Experal, IV Tylenol, and IV Decadron as long as there are no contraindications to the above medications. Arlee Muslim, PA-C

## 2015-09-05 ENCOUNTER — Encounter (HOSPITAL_COMMUNITY): Payer: Self-pay

## 2015-09-05 ENCOUNTER — Encounter (HOSPITAL_COMMUNITY)
Admission: RE | Admit: 2015-09-05 | Discharge: 2015-09-05 | Disposition: A | Discharge status: Home / Self Care | Payer: Medicare Other | Source: Ambulatory Visit | Attending: Orthopedic Surgery | Admitting: Orthopedic Surgery

## 2015-09-05 DIAGNOSIS — M1712 Unilateral primary osteoarthritis, left knee: Secondary | ICD-10-CM | POA: Insufficient documentation

## 2015-09-05 DIAGNOSIS — Z01812 Encounter for preprocedural laboratory examination: Secondary | ICD-10-CM | POA: Diagnosis not present

## 2015-09-05 DIAGNOSIS — M79605 Pain in left leg: Secondary | ICD-10-CM | POA: Insufficient documentation

## 2015-09-05 HISTORY — DX: Personal history of other specified conditions: Z87.898

## 2015-09-05 HISTORY — DX: Paresthesia of skin: R20.0

## 2015-09-05 HISTORY — DX: Anesthesia of skin: R20.2

## 2015-09-05 HISTORY — DX: Unspecified osteoarthritis, unspecified site: M19.90

## 2015-09-05 HISTORY — DX: Unspecified cataract: H26.9

## 2015-09-05 HISTORY — DX: Unspecified hearing loss, unspecified ear: H91.90

## 2015-09-05 HISTORY — DX: Arthropathic psoriasis, unspecified: L40.50

## 2015-09-05 HISTORY — DX: Age-related osteoporosis without current pathological fracture: M81.0

## 2015-09-05 HISTORY — DX: Headache: R51

## 2015-09-05 HISTORY — DX: Angina pectoris, unspecified: I20.9

## 2015-09-05 HISTORY — DX: Psoriasis, unspecified: L40.9

## 2015-09-05 HISTORY — DX: Measles without complication: B05.9

## 2015-09-05 HISTORY — DX: Mumps without complication: B26.9

## 2015-09-05 HISTORY — DX: Inflammatory liver disease, unspecified: K75.9

## 2015-09-05 HISTORY — DX: Dry mouth, unspecified: R68.2

## 2015-09-05 LAB — CBC
HEMATOCRIT: 37.7 % (ref 36.0–46.0)
Hemoglobin: 12.4 g/dL (ref 12.0–15.0)
MCH: 29.6 pg (ref 26.0–34.0)
MCHC: 32.9 g/dL (ref 30.0–36.0)
MCV: 90 fL (ref 78.0–100.0)
PLATELETS: 157 10*3/uL (ref 150–400)
RBC: 4.19 MIL/uL (ref 3.87–5.11)
RDW: 12.6 % (ref 11.5–15.5)
WBC: 7 10*3/uL (ref 4.0–10.5)

## 2015-09-05 LAB — URINALYSIS, ROUTINE W REFLEX MICROSCOPIC
BILIRUBIN URINE: NEGATIVE
Glucose, UA: NEGATIVE mg/dL
HGB URINE DIPSTICK: NEGATIVE
Ketones, ur: NEGATIVE mg/dL
Leukocytes, UA: NEGATIVE
Nitrite: NEGATIVE
PROTEIN: NEGATIVE mg/dL
Specific Gravity, Urine: 1.016 (ref 1.005–1.030)
UROBILINOGEN UA: 0.2 mg/dL (ref 0.0–1.0)
pH: 5 (ref 5.0–8.0)

## 2015-09-05 LAB — SURGICAL PCR SCREEN
MRSA, PCR: NEGATIVE
STAPHYLOCOCCUS AUREUS: NEGATIVE

## 2015-09-05 LAB — COMPREHENSIVE METABOLIC PANEL
ALBUMIN: 4 g/dL (ref 3.5–5.0)
ALT: 15 U/L (ref 14–54)
ANION GAP: 9 (ref 5–15)
AST: 22 U/L (ref 15–41)
Alkaline Phosphatase: 62 U/L (ref 38–126)
BILIRUBIN TOTAL: 0.5 mg/dL (ref 0.3–1.2)
BUN: 15 mg/dL (ref 6–20)
CHLORIDE: 105 mmol/L (ref 101–111)
CO2: 26 mmol/L (ref 22–32)
Calcium: 9.3 mg/dL (ref 8.9–10.3)
Creatinine, Ser: 0.75 mg/dL (ref 0.44–1.00)
GFR calc Af Amer: >60 mL/min (ref >60)
GFR calc non Af Amer: >60 mL/min (ref >60)
GLUCOSE: 154 mg/dL — AB (ref 65–99)
POTASSIUM: 4.6 mmol/L (ref 3.5–5.1)
SODIUM: 140 mmol/L (ref 135–145)
TOTAL PROTEIN: 6.6 g/dL (ref 6.5–8.1)

## 2015-09-05 LAB — PROTIME-INR
INR: 2.03 — ABNORMAL HIGH (ref 0.00–1.49)
Prothrombin Time: 22.8 seconds — ABNORMAL HIGH (ref 11.6–15.2)

## 2015-09-05 LAB — APTT: APTT: 60 s — AB (ref 24–37)

## 2015-09-05 NOTE — Patient Instructions (Addendum)
JIAYI LENGACHER  09/05/2015   Your procedure is scheduled on: Monday 09/11/15  Report to [facility]  Entrance take [facility]  elevators to 3rd floor to  [place] at 6:00 AM.  Call this number if you have problems the morning of surgery [phone]   Remember: ONLY 1 PERSON MAY GO WITH YOU TO SHORT STAY TO GET  READY MORNING OF [place].  Do not eat food or drink liquids :After Midnight.    Take these medicines the morning of surgery with A SIP OF WATER: carvedilol (coreg)                               You may not have any metal on your body including hair pins and              piercings  Do not wear jewelry, make-up, lotions, powders or perfumes, deodorant             Do not wear nail polish.  Do not shave  48 hours prior to surgery.              Men may shave face and neck.  Do not bring valuables to the hospital. [place].  Contacts, dentures or bridgework may not be worn into surgery.  Leave suitcase in the car. After surgery it may be brought to your room.              Please read over the following fact sheets you were given: MRSA information  _____________________________________________________________________          [facility] - Preparing for Surgery Before surgery, you can play an important role.  Because skin is not sterile, your skin needs to be as free of germs as possible.  You can reduce the number of germs on your skin by washing with CHG (chlorahexidine gluconate) soap before surgery.  CHG is an antiseptic cleaner which kills germs and bonds with the skin to continue killing germs even after washing. Please DO NOT use if you have an allergy to CHG or antibacterial soaps.  If your skin becomes reddened/irritated stop using the CHG and inform your nurse when you arrive at Short Stay. Do not shave (including legs and underarms) for at least 48 hours prior to the first CHG shower.  You may  shave your face/neck. Please follow these instructions carefully:  1.  Shower with CHG Soap the night before surgery and the  morning of Surgery.  2.  If you choose to wash your hair, wash your hair first as usual with your  normal  shampoo.  3.  After you shampoo, rinse your hair and body thoroughly to remove the  shampoo.                            4.  Use CHG as you would any other liquid soap.  You can apply chg directly  to the skin and wash                       Gently with a scrungie or clean washcloth.  5.  Apply the CHG Soap to your body ONLY FROM THE  NECK DOWN.   Do not use on face/ open                           Wound or open sores. Avoid contact with eyes, ears mouth and genitals (private parts).                       Wash face,  Genitals (private parts) with your normal soap.             6.  Wash thoroughly, paying special attention to the area where your surgery  will be performed.  7.  Thoroughly rinse your body with warm water from the neck down.  8.  DO NOT shower/wash with your normal soap after using and rinsing off  the CHG Soap.                9.  Pat yourself dry with a clean towel.            10.  Wear clean pajamas.            11.  Place clean sheets on your bed the night of your first shower and do not  sleep with pets. Day of Surgery : Do not apply any lotions/deodorants the morning of surgery.  Please wear clean clothes to the hospital/surgery center.  FAILURE TO FOLLOW THESE INSTRUCTIONS MAY RESULT IN THE CANCELLATION OF YOUR SURGERY PATIENT SIGNATURE_________________________________  NURSE SIGNATURE__________________________________  ________________________________________________________________________  WHAT IS A BLOOD TRANSFUSION? Blood Transfusion Information  A transfusion is the replacement of blood or some of its parts. Blood is made up of multiple cells which provide different functions.  Red blood cells carry oxygen and are used for blood loss  replacement.  White blood cells fight against infection.  Platelets control bleeding.  Plasma helps clot blood.  Other blood products are available for specialized needs, such as hemophilia or other clotting disorders. BEFORE THE TRANSFUSION  Who gives blood for transfusions?   Healthy volunteers who are fully evaluated to make sure their blood is safe. This is blood bank blood. Transfusion therapy is the safest it has ever been in the practice of medicine. Before blood is taken from a donor, a complete history is taken to make sure that person has no history of diseases nor engages in risky social behavior (examples are intravenous drug use or sexual activity with multiple partners). The donor's travel history is screened to minimize risk of transmitting infections, such as malaria. The donated blood is tested for signs of infectious diseases, such as HIV and hepatitis. The blood is then tested to be sure it is compatible with you in order to minimize the chance of a transfusion reaction. If you or a relative donates blood, this is often done in anticipation of surgery and is not appropriate for emergency situations. It takes many days to process the donated blood. RISKS AND COMPLICATIONS Although transfusion therapy is very safe and saves many lives, the main dangers of transfusion include:  1. Getting an infectious disease. 2. Developing a transfusion reaction. This is an allergic reaction to something in the blood you were given. Every precaution is taken to prevent this. The decision to have a blood transfusion has been considered carefully by your caregiver before blood is given. Blood is not given unless the benefits outweigh the risks. AFTER THE TRANSFUSION  Right after receiving a blood transfusion, you will usually feel much better and more  energetic. This is especially true if your red blood cells have gotten low (anemic). The transfusion raises the level of the red blood cells which  carry oxygen, and this usually causes an energy increase.  The nurse administering the transfusion will monitor you carefully for complications. HOME CARE INSTRUCTIONS  No special instructions are needed after a transfusion. You may find your energy is better. Speak with your caregiver about any limitations on activity for underlying diseases you may have. SEEK MEDICAL CARE IF:   Your condition is not improving after your transfusion.  You develop redness or irritation at the intravenous (IV) site. SEEK IMMEDIATE MEDICAL CARE IF:  Any of the following symptoms occur over the next 12 hours:  Shaking chills.  You have a temperature by mouth above 102 F (38.9 C), not controlled by medicine.  Chest, back, or muscle pain.  People around you feel you are not acting correctly or are confused.  Shortness of breath or difficulty breathing.  Dizziness and fainting.  You get a rash or develop hives.  You have a decrease in urine output.  Your urine turns a dark color or changes to pink, red, or brown. Any of the following symptoms occur over the next 10 days:  You have a temperature by mouth above 102 F (38.9 C), not controlled by medicine.  Shortness of breath.  Weakness after normal activity.  The white part of the eye turns yellow (jaundice).  You have a decrease in the amount of urine or are urinating less often.  Your urine turns a dark color or changes to pink, red, or brown. Document Released: 12/13/2000 Document Revised: 03/09/2012 Document Reviewed: 08/01/2008 ExitCare Patient Information 2014 [place].  _______________________________________________________________________  Incentive Spirometer  An incentive spirometer is a tool that can help keep your lungs clear and active. This tool measures how well you are filling your lungs with each breath. Taking long deep breaths may help reverse or decrease the chance of developing breathing (pulmonary) problems  (especially infection) following:  A long period of time when you are unable to move or be active. BEFORE THE PROCEDURE   If the spirometer includes an indicator to show your best effort, your nurse or respiratory therapist will set it to a desired goal.  If possible, sit up straight or lean slightly forward. Try not to slouch.  Hold the incentive spirometer in an upright position. INSTRUCTIONS FOR USE  3. Sit on the edge of your bed if possible, or sit up as far as you can in bed or on a chair. 4. Hold the incentive spirometer in an upright position. 5. Breathe out normally. 6. Place the mouthpiece in your mouth and seal your lips tightly around it. 7. Breathe in slowly and as deeply as possible, raising the piston or the ball toward the top of the column. 8. Hold your breath for 3-5 seconds or for as long as possible. Allow the piston or ball to fall to the bottom of the column. 9. Remove the mouthpiece from your mouth and breathe out normally. 10. Rest for a few seconds and repeat Steps 1 through 7 at least 10 times every 1-2 hours when you are awake. Take your time and take a few normal breaths between deep breaths. 11. The spirometer may include an indicator to show your best effort. Use the indicator as a goal to work toward during each repetition. 12. After each set of 10 deep breaths, practice coughing to be sure your lungs are  clear. If you have an incision (the cut made at the time of surgery), support your incision when coughing by placing a pillow or rolled up towels firmly against it. Once you are able to get out of bed, walk around indoors and cough well. You may stop using the incentive spirometer when instructed by your caregiver.  RISKS AND COMPLICATIONS  Take your time so you do not get dizzy or light-headed.  If you are in pain, you may need to take or ask for pain medication before doing incentive spirometry. It is harder to take a deep breath if you are having  pain. AFTER USE  Rest and breathe slowly and easily.  It can be helpful to keep track of a log of your progress. Your caregiver can provide you with a simple table to help with this. If you are using the spirometer at home, follow these instructions: [place] IF:   You are having difficultly using the spirometer.  You have trouble using the spirometer as often as instructed.  Your pain medication is not giving enough relief while using the spirometer.  You develop fever of 100.5 F (38.1 C) or higher. SEEK IMMEDIATE MEDICAL CARE IF:   You cough up bloody sputum that had not been present before.  You develop fever of 102 F (38.9 C) or greater.  You develop worsening pain at or near the incision site. MAKE SURE YOU:   Understand these instructions.  Will watch your condition.  Will get help right away if you are not doing well or get worse. Document Released: 04/28/2007 Document Revised: 03/09/2012 Document Reviewed: 06/29/2007 [facility] Patient Information 2014 [place], [place].   ________________________________________________________________________

## 2015-09-05 NOTE — Progress Notes (Signed)
EKG 06/22/15 on EPIC

## 2015-09-06 DIAGNOSIS — M25562 Pain in left knee: Secondary | ICD-10-CM | POA: Diagnosis not present

## 2015-09-06 DIAGNOSIS — L409 Psoriasis, unspecified: Secondary | ICD-10-CM | POA: Diagnosis not present

## 2015-09-06 DIAGNOSIS — M25561 Pain in right knee: Secondary | ICD-10-CM | POA: Diagnosis not present

## 2015-09-06 DIAGNOSIS — G8929 Other chronic pain: Secondary | ICD-10-CM | POA: Diagnosis not present

## 2015-09-06 DIAGNOSIS — L4059 Other psoriatic arthropathy: Secondary | ICD-10-CM | POA: Diagnosis not present

## 2015-09-06 DIAGNOSIS — Z79899 Other long term (current) drug therapy: Secondary | ICD-10-CM | POA: Diagnosis not present

## 2015-09-06 LAB — ABO/RH: ABO/RH(D): A POS

## 2015-09-10 ENCOUNTER — Ambulatory Visit: Payer: Self-pay | Admitting: Orthopedic Surgery

## 2015-09-10 NOTE — H&P (Signed)
[name] DOB: 11-19-1934 Married / Language: English / Race: White Female Date of Admission:  09/11/2015 CC:  Left Knee Pain History of Present Illness The patient is a 81 year old female who comes in for a preoperative History and Physical. The patient is scheduled for a left total knee arthroplasty to be performed by Dr. Gus Rankin. Aluisio, MD at [facility] on 09/11/2015. The patient is a 81 year old female who presented with knee complaints. The patient was seen in referral from Dr. Truett Perna (patients son). The patient reports left knee symptoms including: pain which began year(s) (with increased pain the past 6 months) ago without any known injury. Prior to being seen, the patient was previously evaluated by rheumatologist/Dr. Lendon Colonel. Past treatment for this problem has included intra-articular injection of corticosteroids, as well as a visco. this past Jan. with no relief, and include knee pain and swelling. Unfortunately, her left knee has gotten progressively worse over time. It is bothering her at all times now. Something which she can and cannot do. She has had cortisone injections as well as viscosupplement injections with no benefit. The knee is hurting her day and night. She has problems in her right knee also, but not as bad as the left. They have been treated conservatively in the past for the above stated problem and despite conservative measures, they continue to have progressive pain and severe functional limitations and dysfunction. They have failed non-operative management including home exercise, medications, and injections. It is felt that they would benefit from undergoing total joint replacement. Risks and benefits of the procedure have been discussed with the patient and they elect to proceed with surgery. There are no active contraindications to surgery such as ongoing infection or rapidly progressive neurological disease.   Problem List/Past Medical Primary  osteoarthritis of left knee (M17.12) Migraine Headache Hypercholesterolemia Osteoporosis High blood pressure Cerebrovascular Accident 2012 Cardiac Arrhythmia  Diabetes Mellitus, Type II Coronary artery disease Anemia Impaired Hearing Tinnitus Vertigo Cataract Atrial Flutter [place] Childhood Years Psoriatic Arthritis Menopause Measles Mumps  Allergies  Codeine Phosphate *ANALGESICS - OPIOID* Penicillin VK *PENICILLINS Atorvasatin  Family History  Cancer Paternal Grandfather. child Diabetes Mellitus Mother. Congestive Heart Failure Mother. First Degree Relatives reported Heart disease in female family member before age 25 Heart Disease Maternal Grandfather, Mother. Hypertension Mother.  Social History Current work status retired Children 3 Living situation live with spouse Exercise Exercises never Marital status married No history of drug/alcohol rehab Never consumed alcohol 06/27/2015: Never consumed alcohol Tobacco use Never smoker. 06/27/2015 Not under pain contract Advance Directives Living Will, Healthcare POA Post-Surgical Plans Home versus Rehab  Medication History Enbrel (Subcutaneous) Specific dose unknown - Active. Glucotrol (Oral) Specific dose unknown - Active. Chlorpheniramine Maleate (Oral) Specific dose unknown - Active. Fluticasone Propionate (Nasal) (Nasal) Specific dose unknown - Active. ClonazePAM (Oral) Specific dose unknown - Active. Pradaxa (Oral) Specific dose unknown - Active. Crestor (Oral) Specific dose unknown - Active. GlipiZIDE XL (Oral) Specific dose unknown - Active. Lantuss Forte (Oral) Specific dose unknown - Active. Tums Ultra 1000 (Oral) Specific dose unknown - Active. MetFORMIN HCl (Oral) Specific dose unknown - Active. Vitamin B12 TR (Oral) Specific dose unknown - Active. Lancet Device Specific dose unknown - Active. Carvedilol (Oral) Specific dose unknown -  Active. Ramipril (Oral) Specific dose unknown - Active. Nystatin (Oral) Specific dose unknown - Active. Nitrostat (Sublingual) Specific dose unknown - Active. Tylenol Arthritis Ext Relief (Oral) Specific dose unknown - Active. Vitamin D3 (Oral) Specific dose unknown -  Active.  Past Surgical History Tonsillectomy Coronary Artery Bypass Graft Date: 01/2005. 3 vessels Appendectomy Dilation and Curettage of Uterus Spine Surgery Date: 09/2004.   Review of Systems General Not Present- Chills, Fatigue, Fever, Memory Loss, Night Sweats, Weight Gain and Weight Loss. Skin Present- Psoriasis. Not Present- Eczema, Hives, Itching, Lesions and Rash. HEENT Present- Hearing Loss, Hearing problems and Tinnitus. Not Present- Dentures, Double Vision, Headache and Visual Loss. Respiratory Not Present- Allergies, Chronic Cough, Coughing up blood, Shortness of breath at rest and Shortness of breath with exertion. Cardiovascular Not Present- Chest Pain, Difficulty Breathing Lying Down, Murmur, Palpitations, Racing/skipping heartbeats and Swelling. Gastrointestinal Present- Constipation. Not Present- Abdominal Pain, Bloody Stool, Diarrhea, Difficulty Swallowing, Heartburn, Jaundice, Loss of appetitie, Nausea and Vomiting. Female Genitourinary Not Present- Blood in Urine, Discharge, Flank Pain, Incontinence, Painful Urination, Urgency, Urinary frequency, Urinary Retention, Urinating at Night and Weak urinary stream. Musculoskeletal Present- Back Pain, Joint Pain and Joint Swelling. Not Present- Morning Stiffness, Muscle Pain, Muscle Weakness and Spasms. Neurological Not Present- Blackout spells, Difficulty with balance, Dizziness, Paralysis, Tremor and Weakness. Psychiatric Not Present- Insomnia.  Vitals Weight: 181 lb Height: 62in Weight was reported by patient. Height was reported by patient. Body Surface Area: 1.83 m Body Mass Index: 33.1 kg/m  BP: 132/58 (Sitting, Left Arm,  Standard)  Physical Exam General Mental Status -Alert, cooperative and good historian. General Appearance-pleasant, Not in acute distress. Orientation-Oriented X3. Build & Nutrition-Well nourished and Well developed.  Head and Neck Head-normocephalic, atraumatic . Neck Global Assessment - supple, no bruit auscultated on the right, no bruit auscultated on the left.  Eye Vision-Wears corrective lenses. Pupil - Bilateral-Regular and Round. Motion - Bilateral-EOMI.  Chest and Lung Exam Auscultation Breath sounds - clear at anterior chest wall and clear at posterior chest wall. Adventitious sounds - No Adventitious sounds.  Cardiovascular Auscultation Rhythm - Regular rate and rhythm. Heart Sounds - S1 WNL and S2 WNL. Murmurs & Other Heart Sounds - Auscultation of the heart reveals - No Murmurs.  Abdomen Palpation/Percussion Tenderness - Abdomen is non-tender to palpation. Rigidity (guarding) - Abdomen is soft. Auscultation Auscultation of the abdomen reveals - Bowel sounds normal.  Female Genitourinary Note: Not done, not pertinent to present illness   Musculoskeletal Note: Well-developed female, alert and oriented, no apparent distress. Her hip show normal range of motion, no discomfort. Left knee no effusion. Range about 10 to 125 degrees. There is marked crepitus on range of motion, tender medial greater than lateral. No instability. Right knee, no effusion, range 5 to 130, has slight crepitus on range of motion. Tenderness medial greater than lateral. No instability. Pulses are intact distally with normal sensation and motor.  RADIOGRAPHS Radiographs taken both knees and lateral show severe bone-on-bone arthritis medial and patellofemoral compartments of the left knee and right knee also has degenerative change, but to a slightly lesser degree than the left.   Assessment & Plan Primary osteoarthritis of left knee (M17.12) Note:Surgical Plans: Left  Total Knee Replacement  Disposition: Home versus Rehab  PCP: Dr. Pete Glatter - Patient has been seen preoperatively and felt to be stable for surgery. Cards: Dr. Garnette Scheuermann - Patient has been seen preoperatively and felt to be stable for surgery.  Topical TXA - CAD  Anesthesia Issues: None  Signed electronically by Lauraine Rinne, III PA-C

## 2015-09-11 ENCOUNTER — Inpatient Hospital Stay (HOSPITAL_COMMUNITY): Payer: Medicare Other | Admitting: Certified Registered Nurse Anesthetist

## 2015-09-11 ENCOUNTER — Inpatient Hospital Stay (HOSPITAL_COMMUNITY)
Admission: RE | Admit: 2015-09-11 | Discharge: 2015-09-14 | DRG: 470 | Disposition: A | Discharge status: Home Health | Payer: Medicare Other | Source: Ambulatory Visit | Attending: Orthopedic Surgery | Admitting: Orthopedic Surgery

## 2015-09-11 ENCOUNTER — Encounter (HOSPITAL_COMMUNITY): Payer: Self-pay | Admitting: Certified Registered Nurse Anesthetist

## 2015-09-11 ENCOUNTER — Encounter (HOSPITAL_COMMUNITY)
Admission: RE | Disposition: A | Discharge status: Home Health | Payer: Self-pay | Source: Ambulatory Visit | Attending: Orthopedic Surgery

## 2015-09-11 DIAGNOSIS — M179 Osteoarthritis of knee, unspecified: Secondary | ICD-10-CM | POA: Diagnosis present

## 2015-09-11 DIAGNOSIS — Z8673 Personal history of transient ischemic attack (TIA), and cerebral infarction without residual deficits: Secondary | ICD-10-CM

## 2015-09-11 DIAGNOSIS — Z7901 Long term (current) use of anticoagulants: Secondary | ICD-10-CM

## 2015-09-11 DIAGNOSIS — E119 Type 2 diabetes mellitus without complications: Secondary | ICD-10-CM | POA: Diagnosis present

## 2015-09-11 DIAGNOSIS — M1712 Unilateral primary osteoarthritis, left knee: Secondary | ICD-10-CM

## 2015-09-11 DIAGNOSIS — M810 Age-related osteoporosis without current pathological fracture: Secondary | ICD-10-CM | POA: Diagnosis present

## 2015-09-11 DIAGNOSIS — H9190 Unspecified hearing loss, unspecified ear: Secondary | ICD-10-CM | POA: Diagnosis present

## 2015-09-11 DIAGNOSIS — E780 Pure hypercholesterolemia: Secondary | ICD-10-CM | POA: Diagnosis present

## 2015-09-11 DIAGNOSIS — I2510 Atherosclerotic heart disease of native coronary artery without angina pectoris: Secondary | ICD-10-CM | POA: Diagnosis present

## 2015-09-11 DIAGNOSIS — Z8249 Family history of ischemic heart disease and other diseases of the circulatory system: Secondary | ICD-10-CM | POA: Diagnosis not present

## 2015-09-11 DIAGNOSIS — Z01812 Encounter for preprocedural laboratory examination: Secondary | ICD-10-CM

## 2015-09-11 DIAGNOSIS — Z951 Presence of aortocoronary bypass graft: Secondary | ICD-10-CM

## 2015-09-11 DIAGNOSIS — M25562 Pain in left knee: Secondary | ICD-10-CM | POA: Diagnosis present

## 2015-09-11 DIAGNOSIS — M1710 Unilateral primary osteoarthritis, unspecified knee: Secondary | ICD-10-CM | POA: Diagnosis present

## 2015-09-11 DIAGNOSIS — Z79899 Other long term (current) drug therapy: Secondary | ICD-10-CM | POA: Diagnosis not present

## 2015-09-11 DIAGNOSIS — I10 Essential (primary) hypertension: Secondary | ICD-10-CM | POA: Diagnosis present

## 2015-09-11 HISTORY — PX: TOTAL KNEE ARTHROPLASTY: SHX125

## 2015-09-11 LAB — GLUCOSE, CAPILLARY
GLUCOSE-CAPILLARY: 163 mg/dL — AB (ref 65–99)
GLUCOSE-CAPILLARY: 253 mg/dL — AB (ref 65–99)
Glucose-Capillary: 133 mg/dL — ABNORMAL HIGH (ref 65–99)
Glucose-Capillary: 159 mg/dL — ABNORMAL HIGH (ref 65–99)

## 2015-09-11 LAB — TYPE AND SCREEN
ABO/RH(D): A POS
Antibody Screen: NEGATIVE

## 2015-09-11 LAB — PROTIME-INR
INR: 1.06 (ref 0.00–1.49)
Prothrombin Time: 14 seconds (ref 11.6–15.2)

## 2015-09-11 SURGERY — ARTHROPLASTY, KNEE, TOTAL
Anesthesia: General | Site: Knee | Laterality: Left

## 2015-09-11 MED ORDER — GLIPIZIDE ER 10 MG PO TB24
10.0000 mg | ORAL_TABLET | Freq: Every day | ORAL | Status: DC
  Administered 2015-09-12 – 2015-09-14 (×3): 10 mg via ORAL
  Filled 2015-09-11 (×4): qty 1

## 2015-09-11 MED ORDER — SODIUM CHLORIDE 0.9 % IJ SOLN
INTRAMUSCULAR | Status: AC
  Filled 2015-09-11: qty 50

## 2015-09-11 MED ORDER — TRAMADOL HCL 50 MG PO TABS
50.0000 mg | ORAL_TABLET | Freq: Four times a day (QID) | ORAL | Status: DC | PRN
  Administered 2015-09-13: 50 mg via ORAL
  Administered 2015-09-13: 100 mg via ORAL
  Administered 2015-09-14 (×2): 50 mg via ORAL
  Filled 2015-09-11 (×2): qty 2
  Filled 2015-09-11 (×2): qty 1

## 2015-09-11 MED ORDER — ONDANSETRON HCL 4 MG/2ML IJ SOLN
INTRAMUSCULAR | Status: AC
  Filled 2015-09-11: qty 2

## 2015-09-11 MED ORDER — ONDANSETRON HCL 4 MG/2ML IJ SOLN
4.0000 mg | Freq: Once | INTRAMUSCULAR | Status: AC | PRN
  Administered 2015-09-11: 4 mg via INTRAVENOUS

## 2015-09-11 MED ORDER — DIPHENHYDRAMINE HCL 12.5 MG/5ML PO ELIX: 12.5000 mg | ORAL_SOLUTION | ORAL | Status: DC | PRN

## 2015-09-11 MED ORDER — EPHEDRINE SULFATE 50 MG/ML IJ SOLN
INTRAMUSCULAR | Status: DC | PRN
  Administered 2015-09-11 (×2): 2.5 mg via INTRAVENOUS

## 2015-09-11 MED ORDER — BUPIVACAINE LIPOSOME 1.3 % IJ SUSP
INTRAMUSCULAR | Status: DC | PRN
  Administered 2015-09-11: 20 mL

## 2015-09-11 MED ORDER — DEXAMETHASONE SODIUM PHOSPHATE 10 MG/ML IJ SOLN
INTRAMUSCULAR | Status: AC
  Filled 2015-09-11: qty 1

## 2015-09-11 MED ORDER — FENTANYL CITRATE (PF) 100 MCG/2ML IJ SOLN
INTRAMUSCULAR | Status: AC
  Filled 2015-09-11: qty 4

## 2015-09-11 MED ORDER — VANCOMYCIN HCL IN DEXTROSE 1-5 GM/200ML-% IV SOLN
1000.0000 mg | Freq: Two times a day (BID) | INTRAVENOUS | Status: AC
  Administered 2015-09-11: 1000 mg via INTRAVENOUS
  Filled 2015-09-11: qty 200

## 2015-09-11 MED ORDER — MEPERIDINE HCL 50 MG/ML IJ SOLN: 6.2500 mg | INTRAMUSCULAR | Status: DC | PRN

## 2015-09-11 MED ORDER — 0.9 % SODIUM CHLORIDE (POUR BTL) OPTIME
TOPICAL | Status: DC | PRN
  Administered 2015-09-11: 1000 mL

## 2015-09-11 MED ORDER — ONDANSETRON HCL 4 MG PO TABS: 4.0000 mg | ORAL_TABLET | Freq: Four times a day (QID) | ORAL | Status: DC | PRN

## 2015-09-11 MED ORDER — ONDANSETRON HCL 4 MG/2ML IJ SOLN
INTRAMUSCULAR | Status: DC | PRN
Start: 2015-09-11 — End: 2015-09-11
  Administered 2015-09-11 (×2): 2 mg via INTRAVENOUS

## 2015-09-11 MED ORDER — RAMIPRIL 5 MG PO CAPS
5.0000 mg | ORAL_CAPSULE | Freq: Two times a day (BID) | ORAL | Status: DC
  Administered 2015-09-11 – 2015-09-13 (×4): 5 mg via ORAL
  Filled 2015-09-11 (×8): qty 1

## 2015-09-11 MED ORDER — NITROGLYCERIN 0.4 MG SL SUBL: 0.4000 mg | SUBLINGUAL_TABLET | SUBLINGUAL | Status: DC | PRN

## 2015-09-11 MED ORDER — ONDANSETRON HCL 4 MG/2ML IJ SOLN: 4.0000 mg | Freq: Four times a day (QID) | INTRAMUSCULAR | Status: DC | PRN

## 2015-09-11 MED ORDER — PHENOL 1.4 % MT LIQD
1.0000 | OROMUCOSAL | Status: DC | PRN
  Filled 2015-09-11: qty 177

## 2015-09-11 MED ORDER — ACETAMINOPHEN 650 MG RE SUPP: 650.0000 mg | Freq: Four times a day (QID) | RECTAL | Status: DC | PRN

## 2015-09-11 MED ORDER — METHOCARBAMOL 500 MG PO TABS
500.0000 mg | ORAL_TABLET | Freq: Four times a day (QID) | ORAL | Status: DC | PRN
  Administered 2015-09-11 – 2015-09-12 (×3): 500 mg via ORAL
  Filled 2015-09-11 (×4): qty 1

## 2015-09-11 MED ORDER — ACETAMINOPHEN 10 MG/ML IV SOLN
1000.0000 mg | Freq: Once | INTRAVENOUS | Status: AC
  Administered 2015-09-11: 1000 mg via INTRAVENOUS
  Filled 2015-09-11: qty 100

## 2015-09-11 MED ORDER — SODIUM CHLORIDE 0.9 % IJ SOLN
INTRAMUSCULAR | Status: AC
  Filled 2015-09-11: qty 20

## 2015-09-11 MED ORDER — ACETAMINOPHEN 10 MG/ML IV SOLN
INTRAVENOUS | Status: AC
  Filled 2015-09-11: qty 100

## 2015-09-11 MED ORDER — SODIUM CHLORIDE 0.9 % IJ SOLN
INTRAMUSCULAR | Status: DC | PRN
Start: 2015-09-11 — End: 2015-09-11
  Administered 2015-09-11: 30 mL

## 2015-09-11 MED ORDER — HYDROMORPHONE HCL 1 MG/ML IJ SOLN
0.5000 mg | INTRAMUSCULAR | Status: DC | PRN
  Administered 2015-09-11: 0.5 mg via INTRAVENOUS
  Filled 2015-09-11: qty 1

## 2015-09-11 MED ORDER — METOCLOPRAMIDE HCL 5 MG/ML IJ SOLN
5.0000 mg | Freq: Three times a day (TID) | INTRAMUSCULAR | Status: DC | PRN
  Administered 2015-09-11: 10 mg via INTRAVENOUS
  Filled 2015-09-11: qty 2

## 2015-09-11 MED ORDER — POLYETHYLENE GLYCOL 3350 17 G PO PACK
17.0000 g | PACK | Freq: Every day | ORAL | Status: DC | PRN
  Administered 2015-09-14: 17 g via ORAL
  Filled 2015-09-11: qty 1

## 2015-09-11 MED ORDER — CARVEDILOL 25 MG PO TABS
25.0000 mg | ORAL_TABLET | Freq: Two times a day (BID) | ORAL | Status: DC
  Administered 2015-09-11 – 2015-09-14 (×6): 25 mg via ORAL
  Filled 2015-09-11 (×8): qty 1

## 2015-09-11 MED ORDER — BUPIVACAINE HCL 0.25 % IJ SOLN
INTRAMUSCULAR | Status: DC | PRN
  Administered 2015-09-11: 20 mL

## 2015-09-11 MED ORDER — FLEET ENEMA 7-19 GM/118ML RE ENEM: 1.0000 | ENEMA | Freq: Once | RECTAL | Status: DC | PRN

## 2015-09-11 MED ORDER — HYDROMORPHONE HCL 1 MG/ML IJ SOLN: 0.2500 mg | INTRAMUSCULAR | Status: DC | PRN

## 2015-09-11 MED ORDER — FENTANYL CITRATE (PF) 100 MCG/2ML IJ SOLN
INTRAMUSCULAR | Status: DC | PRN
  Administered 2015-09-11 (×2): 25 ug via INTRAVENOUS
  Administered 2015-09-11: 50 ug via INTRAVENOUS
  Administered 2015-09-11: 25 ug via INTRAVENOUS
  Administered 2015-09-11: 50 ug via INTRAVENOUS
  Administered 2015-09-11: 25 ug via INTRAVENOUS

## 2015-09-11 MED ORDER — TRANEXAMIC ACID 1000 MG/10ML IV SOLN
2000.0000 mg | Freq: Once | INTRAVENOUS | Status: DC
  Filled 2015-09-11: qty 20

## 2015-09-11 MED ORDER — CLONAZEPAM 0.5 MG PO TABS: 0.2500 mg | ORAL_TABLET | Freq: Every evening | ORAL | Status: DC | PRN

## 2015-09-11 MED ORDER — BUPIVACAINE LIPOSOME 1.3 % IJ SUSP
20.0000 mL | Freq: Once | INTRAMUSCULAR | Status: DC
  Filled 2015-09-11: qty 20

## 2015-09-11 MED ORDER — SUCCINYLCHOLINE CHLORIDE 20 MG/ML IJ SOLN
INTRAMUSCULAR | Status: DC | PRN
  Administered 2015-09-11: 80 mg via INTRAVENOUS

## 2015-09-11 MED ORDER — HYDROMORPHONE HCL 2 MG PO TABS
2.0000 mg | ORAL_TABLET | ORAL | Status: DC | PRN
  Administered 2015-09-11 – 2015-09-13 (×7): 2 mg via ORAL
  Filled 2015-09-11 (×6): qty 1
  Filled 2015-09-11: qty 2

## 2015-09-11 MED ORDER — PROMETHAZINE HCL 25 MG/ML IJ SOLN: 6.2500 mg | INTRAMUSCULAR | Status: DC | PRN

## 2015-09-11 MED ORDER — VANCOMYCIN HCL IN DEXTROSE 1-5 GM/200ML-% IV SOLN
1000.0000 mg | INTRAVENOUS | Status: AC
  Administered 2015-09-11 (×2): 1000 mg via INTRAVENOUS
  Filled 2015-09-11: qty 200

## 2015-09-11 MED ORDER — METOCLOPRAMIDE HCL 10 MG PO TABS: 5.0000 mg | ORAL_TABLET | Freq: Three times a day (TID) | ORAL | Status: DC | PRN

## 2015-09-11 MED ORDER — LACTATED RINGERS IV SOLN
INTRAVENOUS | Status: DC | PRN
Start: 2015-09-11 — End: 2015-09-11
  Administered 2015-09-11 (×2): via INTRAVENOUS

## 2015-09-11 MED ORDER — LACTATED RINGERS IV SOLN: INTRAVENOUS | Status: DC

## 2015-09-11 MED ORDER — BISACODYL 10 MG RE SUPP: 10.0000 mg | Freq: Every day | RECTAL | Status: DC | PRN

## 2015-09-11 MED ORDER — DEXAMETHASONE SODIUM PHOSPHATE 10 MG/ML IJ SOLN: 10.0000 mg | Freq: Once | INTRAMUSCULAR | Status: DC

## 2015-09-11 MED ORDER — SODIUM CHLORIDE 0.9 % IV SOLN
INTRAVENOUS | Status: DC
  Administered 2015-09-11: 17:00:00 via INTRAVENOUS

## 2015-09-11 MED ORDER — HYDROMORPHONE HCL 2 MG/ML IJ SOLN
INTRAMUSCULAR | Status: AC
  Filled 2015-09-11: qty 1

## 2015-09-11 MED ORDER — PROPOFOL 10 MG/ML IV BOLUS
INTRAVENOUS | Status: DC | PRN
  Administered 2015-09-11 (×4): 25 mg via INTRAVENOUS
  Administered 2015-09-11: 100 mg via INTRAVENOUS

## 2015-09-11 MED ORDER — HYDROMORPHONE HCL 1 MG/ML IJ SOLN
INTRAMUSCULAR | Status: DC | PRN
  Administered 2015-09-11 (×4): .5 mg via INTRAVENOUS

## 2015-09-11 MED ORDER — PROPOFOL 10 MG/ML IV BOLUS
INTRAVENOUS | Status: AC
  Filled 2015-09-11: qty 20

## 2015-09-11 MED ORDER — INSULIN ASPART 100 UNIT/ML ~~LOC~~ SOLN
0.0000 [IU] | Freq: Three times a day (TID) | SUBCUTANEOUS | Status: DC
  Administered 2015-09-11: 3 [IU] via SUBCUTANEOUS
  Administered 2015-09-12: 5 [IU] via SUBCUTANEOUS
  Administered 2015-09-12 (×2): 8 [IU] via SUBCUTANEOUS
  Administered 2015-09-13 (×2): 3 [IU] via SUBCUTANEOUS
  Administered 2015-09-13: 5 [IU] via SUBCUTANEOUS
  Administered 2015-09-14: 3 [IU] via SUBCUTANEOUS
  Administered 2015-09-14: 5 [IU] via SUBCUTANEOUS

## 2015-09-11 MED ORDER — LIDOCAINE HCL 1 % IJ SOLN
INTRAMUSCULAR | Status: AC
  Filled 2015-09-11: qty 20

## 2015-09-11 MED ORDER — MENTHOL 3 MG MT LOZG: 1.0000 | LOZENGE | OROMUCOSAL | Status: DC | PRN

## 2015-09-11 MED ORDER — ACETAMINOPHEN 500 MG PO TABS
1000.0000 mg | ORAL_TABLET | Freq: Four times a day (QID) | ORAL | Status: AC
Start: 2015-09-11 — End: 2015-09-12
  Administered 2015-09-12 (×4): 1000 mg via ORAL
  Filled 2015-09-11 (×4): qty 2

## 2015-09-11 MED ORDER — DEXAMETHASONE SODIUM PHOSPHATE 10 MG/ML IJ SOLN
INTRAMUSCULAR | Status: DC | PRN
  Administered 2015-09-11: 10 mg via INTRAVENOUS

## 2015-09-11 MED ORDER — SODIUM CHLORIDE 0.9 % IV SOLN
INTRAVENOUS | Status: DC
  Administered 2015-09-11: 1000 mL via INTRAVENOUS

## 2015-09-11 MED ORDER — RIVAROXABAN 10 MG PO TABS
10.0000 mg | ORAL_TABLET | Freq: Every day | ORAL | Status: DC
  Administered 2015-09-12 – 2015-09-14 (×3): 10 mg via ORAL
  Filled 2015-09-11 (×4): qty 1

## 2015-09-11 MED ORDER — ACETAMINOPHEN 325 MG PO TABS
650.0000 mg | ORAL_TABLET | Freq: Four times a day (QID) | ORAL | Status: DC | PRN
  Administered 2015-09-13 (×2): 650 mg via ORAL
  Filled 2015-09-11 (×2): qty 2

## 2015-09-11 MED ORDER — EPHEDRINE SULFATE 50 MG/ML IJ SOLN
INTRAMUSCULAR | Status: AC
  Filled 2015-09-11: qty 1

## 2015-09-11 MED ORDER — LACTATED RINGERS IV SOLN
INTRAVENOUS | Status: DC
  Administered 2015-09-11: 08:00:00 via INTRAVENOUS

## 2015-09-11 MED ORDER — LORATADINE 10 MG PO TABS
10.0000 mg | ORAL_TABLET | Freq: Every day | ORAL | Status: DC
  Administered 2015-09-12 – 2015-09-14 (×3): 10 mg via ORAL
  Filled 2015-09-11 (×3): qty 1

## 2015-09-11 MED ORDER — MIDAZOLAM HCL 2 MG/2ML IJ SOLN
INTRAMUSCULAR | Status: AC
  Filled 2015-09-11: qty 4

## 2015-09-11 MED ORDER — METFORMIN HCL 500 MG PO TABS
1000.0000 mg | ORAL_TABLET | Freq: Two times a day (BID) | ORAL | Status: DC
Start: 2015-09-11 — End: 2015-09-12
  Administered 2015-09-11: 1000 mg via ORAL
  Filled 2015-09-11 (×4): qty 2

## 2015-09-11 MED ORDER — BUPIVACAINE HCL (PF) 0.25 % IJ SOLN
INTRAMUSCULAR | Status: AC
  Filled 2015-09-11: qty 30

## 2015-09-11 MED ORDER — SODIUM CHLORIDE 0.9 % IV SOLN
10.0000 mg | INTRAVENOUS | Status: DC | PRN
  Administered 2015-09-11: 40 ug/min via INTRAVENOUS

## 2015-09-11 MED ORDER — DOCUSATE SODIUM 100 MG PO CAPS
100.0000 mg | ORAL_CAPSULE | Freq: Two times a day (BID) | ORAL | Status: DC
  Administered 2015-09-11 – 2015-09-14 (×6): 100 mg via ORAL

## 2015-09-11 MED ORDER — METHOCARBAMOL 1000 MG/10ML IJ SOLN
500.0000 mg | Freq: Four times a day (QID) | INTRAVENOUS | Status: DC | PRN
  Filled 2015-09-11: qty 5

## 2015-09-11 MED ORDER — TRANEXAMIC ACID 1000 MG/10ML IV SOLN
2000.0000 mg | INTRAVENOUS | Status: DC | PRN
  Administered 2015-09-11: 2000 mg via INTRAVENOUS

## 2015-09-11 MED ORDER — INSULIN GLARGINE 100 UNIT/ML ~~LOC~~ SOLN
44.0000 [IU] | Freq: Every day | SUBCUTANEOUS | Status: DC
  Administered 2015-09-12 – 2015-09-13 (×2): 44 [IU] via SUBCUTANEOUS
  Filled 2015-09-11 (×3): qty 0.44

## 2015-09-11 MED ORDER — ROSUVASTATIN CALCIUM 10 MG PO TABS
10.0000 mg | ORAL_TABLET | Freq: Every day | ORAL | Status: DC
  Administered 2015-09-11 – 2015-09-13 (×3): 10 mg via ORAL
  Filled 2015-09-11 (×4): qty 1

## 2015-09-11 SURGICAL SUPPLY — 66 items
BAG DECANTER FOR FLEXI CONT (MISCELLANEOUS) ×2 IMPLANT
BAG SPEC THK2 15X12 ZIP CLS (MISCELLANEOUS) ×1
BAG ZIPLOCK 12X15 (MISCELLANEOUS) ×2 IMPLANT
BANDAGE ELASTIC 6 VELCRO ST LF (GAUZE/BANDAGES/DRESSINGS) ×2 IMPLANT
BANDAGE ESMARK 6X9 LF (GAUZE/BANDAGES/DRESSINGS) ×1 IMPLANT
BLADE SAG 18X100X1.27 (BLADE) ×2 IMPLANT
BLADE SAW SGTL 11.0X1.19X90.0M (BLADE) ×2 IMPLANT
BNDG CMPR 9X6 STRL LF SNTH (GAUZE/BANDAGES/DRESSINGS) ×1
BNDG ESMARK 6X9 LF (GAUZE/BANDAGES/DRESSINGS) ×2
BOWL SMART MIX CTS (DISPOSABLE) ×2 IMPLANT
CAP KNEE TOTAL 3 SIGMA ×1 IMPLANT
CEMENT HV SMART SET (Cement) ×4 IMPLANT
CHLORAPREP W/TINT 26ML (MISCELLANEOUS) ×2 IMPLANT
CUFF TOURN SGL QUICK 34 (TOURNIQUET CUFF) ×2
CUFF TRNQT CYL 34X4X40X1 (TOURNIQUET CUFF) ×1 IMPLANT
DECANTER SPIKE VIAL GLASS SM (MISCELLANEOUS) ×2 IMPLANT
DRAPE EXTREMITY T 121X128X90 (DRAPE) ×2 IMPLANT
DRAPE POUCH INSTRU U-SHP 10X18 (DRAPES) ×2 IMPLANT
DRAPE U-SHAPE 47X51 STRL (DRAPES) ×2 IMPLANT
DRSG ADAPTIC 3X8 NADH LF (GAUZE/BANDAGES/DRESSINGS) ×2 IMPLANT
DRSG PAD ABDOMINAL 8X10 ST (GAUZE/BANDAGES/DRESSINGS) ×2 IMPLANT
ELECT REM PT RETURN 9FT ADLT (ELECTROSURGICAL) ×2
ELECTRODE REM PT RTRN 9FT ADLT (ELECTROSURGICAL) ×1 IMPLANT
EVACUATOR 1/8 PVC DRAIN (DRAIN) ×2 IMPLANT
FACESHIELD WRAPAROUND (MASK) ×10 IMPLANT
FACESHIELD WRAPAROUND OR TEAM (MASK) ×5 IMPLANT
GAUZE SPONGE 2X2 8PLY STRL LF (GAUZE/BANDAGES/DRESSINGS) IMPLANT
GAUZE SPONGE 4X4 12PLY STRL (GAUZE/BANDAGES/DRESSINGS) ×2 IMPLANT
GLOVE BIO SURGEON STRL SZ7.5 (GLOVE) IMPLANT
GLOVE BIO SURGEON STRL SZ8 (GLOVE) ×2 IMPLANT
GLOVE BIOGEL PI IND STRL 6.5 (GLOVE) IMPLANT
GLOVE BIOGEL PI IND STRL 8 (GLOVE) ×1 IMPLANT
GLOVE BIOGEL PI INDICATOR 6.5 (GLOVE)
GLOVE BIOGEL PI INDICATOR 8 (GLOVE) ×1
GLOVE SURG SS PI 6.5 STRL IVOR (GLOVE) IMPLANT
GOWN STRL REUS W/TWL LRG LVL3 (GOWN DISPOSABLE) ×2 IMPLANT
GOWN STRL REUS W/TWL XL LVL3 (GOWN DISPOSABLE) IMPLANT
HANDPIECE INTERPULSE COAX TIP (DISPOSABLE) ×2
IMMOBILIZER KNEE 20 (SOFTGOODS) ×2
IMMOBILIZER KNEE 20 THIGH 36 (SOFTGOODS) ×1 IMPLANT
KIT BASIN OR (CUSTOM PROCEDURE TRAY) ×2 IMPLANT
MANIFOLD NEPTUNE II (INSTRUMENTS) ×2 IMPLANT
NDL SAFETY ECLIPSE 18X1.5 (NEEDLE) ×2 IMPLANT
NEEDLE HYPO 18GX1.5 SHARP (NEEDLE) ×4
NS IRRIG 1000ML POUR BTL (IV SOLUTION) ×2 IMPLANT
PACK TOTAL JOINT (CUSTOM PROCEDURE TRAY) ×2 IMPLANT
PADDING CAST COTTON 6X4 STRL (CAST SUPPLIES) ×4 IMPLANT
PEN SKIN MARKING BROAD (MISCELLANEOUS) ×2 IMPLANT
POSITIONER SURGICAL ARM (MISCELLANEOUS) ×2 IMPLANT
SET HNDPC FAN SPRY TIP SCT (DISPOSABLE) ×1 IMPLANT
SPONGE GAUZE 2X2 STER 10/PKG (GAUZE/BANDAGES/DRESSINGS) ×1
STRIP CLOSURE SKIN 1/2X4 (GAUZE/BANDAGES/DRESSINGS) ×3 IMPLANT
SUCTION FRAZIER 12FR DISP (SUCTIONS) ×2 IMPLANT
SUT MNCRL AB 4-0 PS2 18 (SUTURE) ×2 IMPLANT
SUT VIC AB 2-0 CT1 27 (SUTURE) ×6
SUT VIC AB 2-0 CT1 TAPERPNT 27 (SUTURE) ×3 IMPLANT
SUT VLOC 180 0 24IN GS25 (SUTURE) ×2 IMPLANT
SYR 20CC LL (SYRINGE) ×2 IMPLANT
SYR 50ML LL SCALE MARK (SYRINGE) ×2 IMPLANT
TOWEL OR 17X26 10 PK STRL BLUE (TOWEL DISPOSABLE) ×2 IMPLANT
TOWEL OR NON WOVEN STRL DISP B (DISPOSABLE) IMPLANT
TRAY FOLEY W/METER SILVER 14FR (SET/KITS/TRAYS/PACK) ×2 IMPLANT
TRAY FOLEY W/METER SILVER 16FR (SET/KITS/TRAYS/PACK) ×1 IMPLANT
WATER STERILE IRR 1500ML POUR (IV SOLUTION) ×2 IMPLANT
WRAP KNEE MAXI GEL POST OP (GAUZE/BANDAGES/DRESSINGS) ×2 IMPLANT
YANKAUER SUCT BULB TIP 10FT TU (MISCELLANEOUS) ×2 IMPLANT

## 2015-09-11 NOTE — Progress Notes (Signed)
Utilization review completed.  

## 2015-09-11 NOTE — Anesthesia Postprocedure Evaluation (Signed)
  Anesthesia Post-op Note  Patient: [name]  Procedure(s) Performed: Procedure(s): LEFT TOTAL KNEE ARTHROPLASTY (Left)  Patient Location: PACU  Anesthesia Type:General  Level of Consciousness: awake, alert  and oriented  Airway and Oxygen Therapy: Patient Spontanous Breathing  Post-op Pain: none  Post-op Assessment: Post-op Vital signs reviewed and Patient's Cardiovascular Status Stable LLE Motor Response: Purposeful movement LLE Sensation: Full sensation RLE Motor Response: Purposeful movement RLE Sensation: Full sensation      Post-op Vital Signs: Reviewed and stable  Last Vitals:  Filed Vitals:   09/11/15 1215  BP: 167/64  Pulse:   Temp:   Resp:     Complications: No apparent anesthesia complications

## 2015-09-11 NOTE — Transfer of Care (Signed)
Immediate Anesthesia Transfer of Care Note  Patient: [name]  Procedure(s) Performed: Procedure(s): LEFT TOTAL KNEE ARTHROPLASTY (Left)  Patient Location: PACU  Anesthesia Type:General  Level of Consciousness: awake, oriented, patient cooperative, lethargic and responds to stimulation  Airway & Oxygen Therapy: Patient Spontanous Breathing and Patient connected to face mask oxygen  Post-op Assessment: Report given to RN, Post -op Vital signs reviewed and stable and Patient moving all extremities  Post vital signs: Reviewed and stable  Last Vitals:  Filed Vitals:   09/11/15 0600  BP: 137/65  Pulse: 74  Temp: 36.6 C  Resp: 16    Complications: No apparent anesthesia complications

## 2015-09-11 NOTE — H&P (View-Only) (Signed)
[name] DOB: 11-19-1934 Married / Language: English / Race: White Female Date of Admission:  09/11/2015 CC:  Left Knee Pain History of Present Illness The patient is a 81 year old female who comes in for a preoperative History and Physical. The patient is scheduled for a left total knee arthroplasty to be performed by Dr. Gus Rankin. Aluisio, MD at [facility] on 09/11/2015. The patient is a 81 year old female who presented with knee complaints. The patient was seen in referral from Dr. Truett Perna (patients son). The patient reports left knee symptoms including: pain which began year(s) (with increased pain the past 6 months) ago without any known injury. Prior to being seen, the patient was previously evaluated by rheumatologist/Dr. Lendon Colonel. Past treatment for this problem has included intra-articular injection of corticosteroids, as well as a visco. this past Jan. with no relief, and include knee pain and swelling. Unfortunately, her left knee has gotten progressively worse over time. It is bothering her at all times now. Something which she can and cannot do. She has had cortisone injections as well as viscosupplement injections with no benefit. The knee is hurting her day and night. She has problems in her right knee also, but not as bad as the left. They have been treated conservatively in the past for the above stated problem and despite conservative measures, they continue to have progressive pain and severe functional limitations and dysfunction. They have failed non-operative management including home exercise, medications, and injections. It is felt that they would benefit from undergoing total joint replacement. Risks and benefits of the procedure have been discussed with the patient and they elect to proceed with surgery. There are no active contraindications to surgery such as ongoing infection or rapidly progressive neurological disease.   Problem List/Past Medical Primary  osteoarthritis of left knee (M17.12) Migraine Headache Hypercholesterolemia Osteoporosis High blood pressure Cerebrovascular Accident 2012 Cardiac Arrhythmia  Diabetes Mellitus, Type II Coronary artery disease Anemia Impaired Hearing Tinnitus Vertigo Cataract Atrial Flutter Jaudice Childhood Years Psoriatic Arthritis Menopause Measles Mumps  Allergies  Codeine Phosphate *ANALGESICS - OPIOID* Penicillin VK *PENICILLINS Atorvasatin  Family History  Cancer Paternal Grandfather. child Diabetes Mellitus Mother. Congestive Heart Failure Mother. First Degree Relatives reported Heart disease in female family member before age 25 Heart Disease Maternal Grandfather, Mother. Hypertension Mother.  Social History Current work status retired Children 3 Living situation live with spouse Exercise Exercises never Marital status married No history of drug/alcohol rehab Never consumed alcohol 06/27/2015: Never consumed alcohol Tobacco use Never smoker. 06/27/2015 Not under pain contract Advance Directives Living Will, Healthcare POA Post-Surgical Plans Home versus Rehab  Medication History Enbrel (Subcutaneous) Specific dose unknown - Active. Glucotrol (Oral) Specific dose unknown - Active. Chlorpheniramine Maleate (Oral) Specific dose unknown - Active. Fluticasone Propionate (Nasal) (Nasal) Specific dose unknown - Active. ClonazePAM (Oral) Specific dose unknown - Active. Pradaxa (Oral) Specific dose unknown - Active. Crestor (Oral) Specific dose unknown - Active. GlipiZIDE XL (Oral) Specific dose unknown - Active. Lantuss Forte (Oral) Specific dose unknown - Active. Tums Ultra 1000 (Oral) Specific dose unknown - Active. MetFORMIN HCl (Oral) Specific dose unknown - Active. Vitamin B12 TR (Oral) Specific dose unknown - Active. Lancet Device Specific dose unknown - Active. Carvedilol (Oral) Specific dose unknown -  Active. Ramipril (Oral) Specific dose unknown - Active. Nystatin (Oral) Specific dose unknown - Active. Nitrostat (Sublingual) Specific dose unknown - Active. Tylenol Arthritis Ext Relief (Oral) Specific dose unknown - Active. Vitamin D3 (Oral) Specific dose unknown -  Active.  Past Surgical History Tonsillectomy Coronary Artery Bypass Graft Date: 01/2005. 3 vessels Appendectomy Dilation and Curettage of Uterus Spine Surgery Date: 09/2004.   Review of Systems General Not Present- Chills, Fatigue, Fever, Memory Loss, Night Sweats, Weight Gain and Weight Loss. Skin Present- Psoriasis. Not Present- Eczema, Hives, Itching, Lesions and Rash. HEENT Present- Hearing Loss, Hearing problems and Tinnitus. Not Present- Dentures, Double Vision, Headache and Visual Loss. Respiratory Not Present- Allergies, Chronic Cough, Coughing up blood, Shortness of breath at rest and Shortness of breath with exertion. Cardiovascular Not Present- Chest Pain, Difficulty Breathing Lying Down, Murmur, Palpitations, Racing/skipping heartbeats and Swelling. Gastrointestinal Present- Constipation. Not Present- Abdominal Pain, Bloody Stool, Diarrhea, Difficulty Swallowing, Heartburn, Jaundice, Loss of appetitie, Nausea and Vomiting. Female Genitourinary Not Present- Blood in Urine, Discharge, Flank Pain, Incontinence, Painful Urination, Urgency, Urinary frequency, Urinary Retention, Urinating at Night and Weak urinary stream. Musculoskeletal Present- Back Pain, Joint Pain and Joint Swelling. Not Present- Morning Stiffness, Muscle Pain, Muscle Weakness and Spasms. Neurological Not Present- Blackout spells, Difficulty with balance, Dizziness, Paralysis, Tremor and Weakness. Psychiatric Not Present- Insomnia.  Vitals Weight: 181 lb Height: 62in Weight was reported by patient. Height was reported by patient. Body Surface Area: 1.83 m Body Mass Index: 33.1 kg/m  BP: 132/58 (Sitting, Left Arm,  Standard)  Physical Exam General Mental Status -Alert, cooperative and good historian. General Appearance-pleasant, Not in acute distress. Orientation-Oriented X3. Build & Nutrition-Well nourished and Well developed.  Head and Neck Head-normocephalic, atraumatic . Neck Global Assessment - supple, no bruit auscultated on the right, no bruit auscultated on the left.  Eye Vision-Wears corrective lenses. Pupil - Bilateral-Regular and Round. Motion - Bilateral-EOMI.  Chest and Lung Exam Auscultation Breath sounds - clear at anterior chest wall and clear at posterior chest wall. Adventitious sounds - No Adventitious sounds.  Cardiovascular Auscultation Rhythm - Regular rate and rhythm. Heart Sounds - S1 WNL and S2 WNL. Murmurs & Other Heart Sounds - Auscultation of the heart reveals - No Murmurs.  Abdomen Palpation/Percussion Tenderness - Abdomen is non-tender to palpation. Rigidity (guarding) - Abdomen is soft. Auscultation Auscultation of the abdomen reveals - Bowel sounds normal.  Female Genitourinary Note: Not done, not pertinent to present illness   Musculoskeletal Note: Well-developed female, alert and oriented, no apparent distress. Her hip show normal range of motion, no discomfort. Left knee no effusion. Range about 10 to 125 degrees. There is marked crepitus on range of motion, tender medial greater than lateral. No instability. Right knee, no effusion, range 5 to 130, has slight crepitus on range of motion. Tenderness medial greater than lateral. No instability. Pulses are intact distally with normal sensation and motor.  RADIOGRAPHS Radiographs taken both knees and lateral show severe bone-on-bone arthritis medial and patellofemoral compartments of the left knee and right knee also has degenerative change, but to a slightly lesser degree than the left.   Assessment & Plan Primary osteoarthritis of left knee (M17.12) Note:Surgical Plans: Left  Total Knee Replacement  Disposition: Home versus Rehab  PCP: Dr. Pete Glatter - Patient has been seen preoperatively and felt to be stable for surgery. Cards: Dr. Garnette Scheuermann - Patient has been seen preoperatively and felt to be stable for surgery.  Topical TXA - CAD  Anesthesia Issues: None  Signed electronically by Lauraine Rinne, III PA-C

## 2015-09-11 NOTE — Anesthesia Preprocedure Evaluation (Addendum)
Anesthesia Evaluation  Patient identified by MRN, date of birth, ID band Patient awake    Reviewed: Allergy & Precautions, NPO status , Patient's Chart, lab work & pertinent test results  Airway Mallampati: III  TM Distance: >3 FB Neck ROM: Full    Dental  (+) Teeth Intact   Pulmonary neg pulmonary ROS,    breath sounds clear to auscultation       Cardiovascular hypertension, Pt. on medications + angina with exertion + CAD and + CABG   Rhythm:Regular Rate:Normal     Neuro/Psych TIACVA negative psych ROS   GI/Hepatic negative GI ROS, (+) Hepatitis -  Endo/Other  diabetes, Type 1, Insulin Dependent, Oral Hypoglycemic Agents  Renal/GU   negative genitourinary   Musculoskeletal  (+) Arthritis ,   Abdominal   Peds negative pediatric ROS (+)  Hematology negative hematology ROS (+)   Anesthesia Other Findings   Reproductive/Obstetrics negative OB ROS                           Lab Results  Component Value Date   WBC 7.0 09/05/2015   HGB 12.4 09/05/2015   HCT 37.7 09/05/2015   MCV 90.0 09/05/2015   PLT 157 09/05/2015   Lab Results  Component Value Date   INR 2.03* 09/05/2015   INR 1.50* 08/04/2014   INR 0.92 02/04/2011   Lab Results  Component Value Date   CREATININE 0.75 09/05/2015   BUN 15 09/05/2015   NA 140 09/05/2015   K 4.6 09/05/2015   CL 105 09/05/2015   CO2 26 09/05/2015   Myocardial Perfusion Test (07/2015)  Nuclear stress EF: 72%.  The study is normal.  This is a low risk study.  The left ventricular ejection fraction is normal (55-65%).  EKG: normal sinus rhythm.   Anesthesia Physical Anesthesia Plan  ASA: III  Anesthesia Plan: General   Post-op Pain Management:    Induction: Intravenous  Airway Management Planned: Oral ETT and LMA  Additional Equipment:   Intra-op Plan:   Post-operative Plan: Extubation in OR  Informed Consent: I have reviewed  the patients History and Physical, chart, labs and discussed the procedure including the risks, benefits and alternatives for the proposed anesthesia with the patient or authorized representative who has indicated his/her understanding and acceptance.   Dental advisory given  Plan Discussed with: CRNA  Anesthesia Plan Comments:        Anesthesia Quick Evaluation

## 2015-09-11 NOTE — Anesthesia Procedure Notes (Signed)
Procedure Name: Intubation Date/Time: 09/11/2015 8:48 AM Performed by: Ofilia Neas Pre-anesthesia Checklist: Patient identified, Timeout performed, Emergency Drugs available, Suction available and Patient being monitored Patient Re-evaluated:Patient Re-evaluated prior to inductionOxygen Delivery Method: Circle system utilized Preoxygenation: Pre-oxygenation with 100% oxygen Intubation Type: IV induction and Cricoid Pressure applied Ventilation: Mask ventilation without difficulty Laryngoscope Size: Mac and 3 Grade View: Grade II Tube type: Oral Tube size: 7.5 mm Number of attempts: 1 Airway Equipment and Method: Stylet Secured at: 21 cm Tube secured with: Tape Dental Injury: Teeth and Oropharynx as per pre-operative assessment

## 2015-09-11 NOTE — Interval H&P Note (Signed)
History and Physical Interval Note:  09/11/2015 7:07 AM  [name]  has presented today for surgery, with the diagnosis of left knee osteoarthritis  The various methods of treatment have been discussed with the patient and family. After consideration of risks, benefits and other options for treatment, the patient has consented to  Procedure(s): LEFT TOTAL KNEE ARTHROPLASTY (Left) as a surgical intervention .  The patient's history has been reviewed, patient examined, no change in status, stable for surgery.  I have reviewed the patient's chart and labs.  Questions were answered to the patient's satisfaction.     Gearlean Alf

## 2015-09-11 NOTE — Plan of Care (Signed)
Problem: Consults Goal: Diagnosis- Total Joint Replacement Primary Total Knee, LEFT

## 2015-09-11 NOTE — Op Note (Signed)
Pre-operative diagnosis- Osteoarthritis  Left knee(s)  Post-operative diagnosis- Osteoarthritis Left knee(s)  Procedure-  Left  Total Knee Arthroplasty  Surgeon- Dione Plover. Jahnya Trindade, MD  Assistant- Arlee Muslim, PA-C   Anesthesia-  General  EBL-* No blood loss amount entered *   Drains Hemovac  Tourniquet time-  Total Tourniquet Time Documented: Thigh (Left) - 31 minutes Total: Thigh (Left) - 31 minutes     Complications- None  Condition-PACU - hemodynamically stable.   Brief Clinical Note  [name] is a 81 y.o. year old female with end stage OA of her left knee with progressively worsening pain and dysfunction. She has constant pain, with activity and at rest and significant functional deficits with difficulties even with ADLs. She has had extensive non-op management including analgesics, injections of cortisone, and home exercise program, but remains in significant pain with significant dysfunction. Radiographs show bone on bone arthritis medial and patellofemoral. She presents now for left Total Knee Arthroplasty.    Procedure in detail---   The patient is brought into the operating room and positioned supine on the operating table. After successful administration of  General,   a tourniquet is placed high on the  Left thigh(s) and the lower extremity is prepped and draped in the usual sterile fashion. Time out is performed by the operating team and then the  Left lower extremity is wrapped in Esmarch, knee flexed and the tourniquet inflated to 300 mmHg.       A midline incision is made with a ten blade through the subcutaneous tissue to the level of the extensor mechanism. A fresh blade is used to make a medial parapatellar arthrotomy. Soft tissue over the proximal medial tibia is subperiosteally elevated to the joint line with a knife and into the semimembranosus bursa with a Cobb elevator. Soft tissue over the proximal lateral tibia is elevated with attention being paid to  avoiding the patellar tendon on the tibial tubercle. The patella is everted, knee flexed 90 degrees and the ACL and PCL are removed. Findings are bone on bone medial and patellofemoral with large global osteophytes        The drill is used to create a starting hole in the distal femur and the canal is thoroughly irrigated with sterile saline to remove the fatty contents. The 5 degree Left  valgus alignment guide is placed into the femoral canal and the distal femoral cutting block is pinned to remove 10 mm off the distal femur. Resection is made with an oscillating saw.      The tibia is subluxed forward and the menisci are removed. The extramedullary alignment guide is placed referencing proximally at the medial aspect of the tibial tubercle and distally along the second metatarsal axis and tibial crest. The block is pinned to remove 70mm off the more deficient medial  side. Resection is made with an oscillating saw. Size 3is the most appropriate size for the tibia and the proximal tibia is prepared with the modular drill and keel punch for that size.      The femoral sizing guide is placed and size 3 is most appropriate. Rotation is marked off the epicondylar axis and confirmed by creating a rectangular flexion gap at 90 degrees. The size 3 cutting block is pinned in this rotation and the anterior, posterior and chamfer cuts are made with the oscillating saw. The intercondylar block is then placed and that cut is made.      Trial size 3 tibial component, trial size  3 posterior stabilized femur and a 10  mm posterior stabilized rotating platform insert trial is placed. Full extension is achieved with excellent varus/valgus and anterior/posterior balance throughout full range of motion. The patella is everted and thickness measured to be 24  mm. Free hand resection is taken to 14 mm, a 38 template is placed, lug holes are drilled, trial patella is placed, and it tracks normally. Osteophytes are removed off the  posterior femur with the trial in place. All trials are removed and the cut bone surfaces prepared with pulsatile lavage. Cement is mixed and once ready for implantation, the size 3 tibial implant, size  3 posterior stabilized femoral component, and the size 38 patella are cemented in place and the patella is held with the clamp. The trial insert is placed and the knee held in full extension. The Exparel (20 ml mixed with 30 ml saline) and .25% Bupivicaine, are injected into the extensor mechanism, posterior capsule, medial and lateral gutters and subcutaneous tissues.  All extruded cement is removed and once the cement is hard the permanent 10 mm posterior stabilized rotating platform insert is placed into the tibial tray.      The wound is copiously irrigated with saline solution and the extensor mechanism closed over a hemovac drain with #1 V-loc suture. The tourniquet is released for a total tourniquet time of 31  minutes. Flexion against gravity is 140 degrees and the patella tracks normally. Subcutaneous tissue is closed with 2.0 vicryl and subcuticular with running 4.0 Monocryl. The incision is cleaned and dried and steri-strips and a bulky sterile dressing are applied. The limb is placed into a knee immobilizer and the patient is awakened and transported to recovery in stable condition.      Please note that a surgical assistant was a medical necessity for this procedure in order to perform it in a safe and expeditious manner. Surgical assistant was necessary to retract the ligaments and vital neurovascular structures to prevent injury to them and also necessary for proper positioning of the limb to allow for anatomic placement of the prosthesis.   Dione Plover Malvern Kadlec, MD    09/11/2015, 9:47 AM

## 2015-09-12 ENCOUNTER — Encounter (HOSPITAL_COMMUNITY): Payer: Self-pay | Admitting: Orthopedic Surgery

## 2015-09-12 LAB — BASIC METABOLIC PANEL
ANION GAP: 7 (ref 5–15)
BUN: 14 mg/dL (ref 6–20)
CHLORIDE: 104 mmol/L (ref 101–111)
CO2: 24 mmol/L (ref 22–32)
Calcium: 8.8 mg/dL — ABNORMAL LOW (ref 8.9–10.3)
Creatinine, Ser: 0.64 mg/dL (ref 0.44–1.00)
GFR calc Af Amer: >60 mL/min (ref >60)
GLUCOSE: 250 mg/dL — AB (ref 65–99)
POTASSIUM: 4.3 mmol/L (ref 3.5–5.1)
Sodium: 135 mmol/L (ref 135–145)

## 2015-09-12 LAB — GLUCOSE, CAPILLARY
GLUCOSE-CAPILLARY: 258 mg/dL — AB (ref 65–99)
GLUCOSE-CAPILLARY: 228 mg/dL — AB (ref 65–99)
Glucose-Capillary: 213 mg/dL — ABNORMAL HIGH (ref 65–99)
Glucose-Capillary: 254 mg/dL — ABNORMAL HIGH (ref 65–99)

## 2015-09-12 LAB — CBC
HEMATOCRIT: 34.7 % — AB (ref 36.0–46.0)
HEMOGLOBIN: 11.7 g/dL — AB (ref 12.0–15.0)
MCH: 30 pg (ref 26.0–34.0)
MCHC: 33.7 g/dL (ref 30.0–36.0)
MCV: 89 fL (ref 78.0–100.0)
Platelets: 150 10*3/uL (ref 150–400)
RBC: 3.9 MIL/uL (ref 3.87–5.11)
RDW: 12.5 % (ref 11.5–15.5)
WBC: 9.5 10*3/uL (ref 4.0–10.5)

## 2015-09-12 NOTE — Progress Notes (Signed)
Physical Therapy Treatment Patient Details Name: [name] MRN: [id number] DOB: 11-29-1934 Today's Date: 25-Sep-2015    History of Present Illness Pt is s/p L TKA. Pt with history of CVA, impaired hearing, DM, HTN, and vertigo.     PT Comments      Follow Up Recommendations  Home health PT     Equipment Recommendations  Rolling walker with 5" wheels    Recommendations for Other Services OT consult     Precautions / Restrictions Precautions Precautions: Fall;Knee Required Braces or Orthoses: Knee Immobilizer - Left Knee Immobilizer - Left: Discontinue once straight leg raise with < 10 degree lag Restrictions Weight Bearing Restrictions: No Other Position/Activity Restrictions: WBAT    Mobility  Bed Mobility               General bed mobility comments: OOB deferred - pt just back from bathroom  Transfers                    Ambulation/Gait                 Stairs            Wheelchair Mobility    Modified Rankin (Stroke Patients Only)       Balance                                    Cognition Arousal/Alertness: Awake/alert Behavior During Therapy: WFL for tasks assessed/performed Overall Cognitive Status: Within Functional Limits for tasks assessed                      Exercises Total Joint Exercises Ankle Circles/Pumps: AROM;Both;15 reps;Supine Quad Sets: AROM;Both;Supine;15 reps Heel Slides: AAROM;Left;15 reps;Supine Straight Leg Raises: AAROM;Left;Supine;15 reps    General Comments        Pertinent Vitals/Pain Pain Assessment: 0-10 Pain Score: 5  Pain Location: L knee Pain Descriptors / Indicators: Aching;Sore;Tender Pain Intervention(s): Limited activity within patient's tolerance;Monitored during session;Premedicated before session;Ice applied    Home Living                      Prior Function            PT Goals (current goals can now be found in the care plan section)  Acute Rehab PT Goals Patient Stated Goal: none stated but agreeable to get up with OT. PT Goal Formulation: With patient Time For Goal Achievement: 09/16/15 Potential to Achieve Goals: Good Progress towards PT goals: Progressing toward goals    Frequency  7X/week    PT Plan Current plan remains appropriate    Co-evaluation             End of Session   Activity Tolerance: Patient tolerated treatment well;Patient limited by fatigue Patient left: in bed;with call bell/phone within reach     Time: 1550-1625 PT Time Calculation (min) (ACUTE ONLY): 35 min  Charges:  $Therapeutic Exercise: 23-37 mins                    G Codes:      [name] 09/25/2015, 4:46 PM

## 2015-09-12 NOTE — Evaluation (Signed)
Occupational Therapy Evaluation Patient Details Name: [name] MRN: [id number] DOB: 07-Dec-1934 Today's Date: 09/12/2015    History of Present Illness Pt is s/p L TKA. Pt with history of CVA, impaired hearing, DM, HTN, and vertigo.    Clinical Impression   Pt up to the commode with min assist and walker and mod assist for clothing management. Family present for session. Will follow on acute to progress independence and safety with ADL.    Follow Up Recommendations  No OT follow up;Supervision/Assistance - 24 hour    Equipment Recommendations  None recommended by OT    Recommendations for Other Services       Precautions / Restrictions Precautions Precautions: Fall;Knee Required Braces or Orthoses: Knee Immobilizer - Left Knee Immobilizer - Left: Discontinue once straight leg raise with < 10 degree lag Restrictions Weight Bearing Restrictions: No      Mobility Bed Mobility               General bed mobility comments: in chair.   Transfers Overall transfer level: Needs assistance Equipment used: Rolling walker (2 wheeled) Transfers: Sit to/from Stand Sit to Stand: Min assist         General transfer comment: cues for hand placement and LE management.     Balance Overall balance assessment: Needs assistance   Sitting balance-Leahy Scale: Good       Standing balance-Leahy Scale: Poor                              ADL Overall ADL's : Needs assistance/impaired Eating/Feeding: Independent;Sitting   Grooming: Wash/dry hands;Minimal assistance;Standing   Upper Body Bathing: Set up;Sitting   Lower Body Bathing: Moderate assistance;Sit to/from stand   Upper Body Dressing : Minimal assistance;Sitting   Lower Body Dressing: Maximal assistance;Sit to/from stand   Toilet Transfer: Minimal assistance;Ambulation;BSC;RW   Toileting- Clothing Manipulation and Hygiene: Moderate assistance;Sit to/from stand         General ADL Comments:  Pt's daughter is a Haematologist and is hands on with care today. Pt moves slowly but doing well with functional toilet transfer with walker. Cues for hand placement and walker distance. Pt has a tub with a tub transfer bench at home. Introduced AE options and family/pt to decide if they would like to purchas.      Vision     Perception     Praxis      Pertinent Vitals/Pain Pain Assessment: Faces Faces Pain Scale: Hurts a little bit Pain Location: L knee Pain Descriptors / Indicators: Sore Pain Intervention(s): Repositioned;Ice applied     Hand Dominance     Extremity/Trunk Assessment Upper Extremity Assessment Upper Extremity Assessment: Overall WFL for tasks assessed           Communication Communication Communication: HOH   Cognition Arousal/Alertness: Awake/alert Behavior During Therapy: WFL for tasks assessed/performed Overall Cognitive Status: Within Functional Limits for tasks assessed                     General Comments       Exercises       Shoulder Instructions      Home Living Family/patient expects to be discharged to:: Private residence Living Arrangements: Spouse/significant other Available Help at Discharge: Personal care attendant;Family Type of Home: House             Bathroom Shower/Tub: Chief Strategy Officer: Standard     Home  Equipment: Shower seat;Bedside commode;Walker - 2 wheels          Prior Functioning/Environment Level of Independence: Independent             OT Diagnosis: Generalized weakness   OT Problem List: Decreased strength;Decreased knowledge of use of DME or AE   OT Treatment/Interventions: Self-care/ADL training;Patient/family education;Therapeutic activities;DME and/or AE instruction    OT Goals(Current goals can be found in the care plan section) Acute Rehab OT Goals Patient Stated Goal: none stated but agreeable to get up with OT. OT Goal Formulation: With patient Time For Goal  Achievement: 09/19/15 Potential to Achieve Goals: Good  OT Frequency: Min 2X/week   Barriers to D/C:            Co-evaluation              End of Session Equipment Utilized During Treatment: Gait belt;Rolling walker;Left knee immobilizer  Activity Tolerance: Patient tolerated treatment well Patient left: in chair;with call bell/phone within reach;with family/visitor present   Time: 1610-9604 OT Time Calculation (min): 26 min Charges:  OT General Charges $OT Visit: 1 Procedure OT Evaluation $Initial OT Evaluation Tier I: 1 Procedure OT Treatments $Therapeutic Activity: 8-22 mins G-Codes:    [name]  [phone] 09/12/2015, 11:14 AM

## 2015-09-12 NOTE — Progress Notes (Signed)
   Subjective: 1 Day Post-Op Procedure(s) (LRB): LEFT TOTAL KNEE ARTHROPLASTY (Left) Patient reports pain as mild and moderate.   Patient seen in rounds with Dr. Wynelle Link. Patient is well, but has had some minor complaints of pain in the knee, requiring pain medications We will start therapy today.  Plan is to go home versus rehab. after hospital stay.  Objective: Vital signs in last 24 hours: Temp:  [97.5 F (36.4 C)-98.6 F (37 C)] 98.3 F (36.8 C) (09/13 0526) Pulse Rate:  [72-98] 80 (09/13 0735) Resp:  [7-16] 15 (09/13 0526) BP: (122-175)/(49-96) 140/54 mmHg (09/13 0735) SpO2:  [96 %-100 %] 98 % (09/13 0526)  Intake/Output from previous day:  Intake/Output Summary (Last 24 hours) at 09/12/15 0939 Last data filed at 09/12/15 0600  Gross per 24 hour  Intake   3355 ml  Output   2750 ml  Net    605 ml    Intake/Output this shift: UOP 900 since around [place]  Labs:  Recent Labs  09/12/15 0434  HGB 11.7*    Recent Labs  09/12/15 0434  WBC 9.5  RBC 3.90  HCT 34.7*  PLT 150    Recent Labs  09/12/15 0434  NA 135  K 4.3  CL 104  CO2 24  BUN 14  CREATININE 0.64  GLUCOSE 250*  CALCIUM 8.8*    Recent Labs  09/11/15 0735  INR 1.06    EXAM General - Patient is Alert, Appropriate and Oriented Extremity - Neurovascular intact Sensation intact distally Dorsiflexion/Plantar flexion intact Dressing - dressing C/D/I Motor Function - intact, moving foot and toes well on exam.  Hemovac pulled without difficulty.  Past Medical History  Diagnosis Date  . Long term (current) use of anticoagulants 11/02/2013    Pradaxa   . Atrial flutter 11/02/2013  . Essential hypertension, benign 11/02/2013  . Coronary artery disease   . Vertigo     being monitored, occasional  . High cholesterol     under control  . Heart disease   . Anginal pain     "saw doctor-think it was esophagus spasm"  . Stroke 2012  . Numbness and tingling     lips  . Mouth dryness   .  Hearing trouble   . Diabetes mellitus without complication     type 2  . Headache 2878    complicated migraine x2   . H/O jaundice     as child  . Hepatitis     hx of hepatitis A as child  . Arthritis   . Psoriasis   . Psoriatic arthritis   . Osteoporosis     "unsure-maybe in knees"  . Cataract     left eye  . Mumps     as teenager  . Measles     as child    Assessment/Plan: 1 Day Post-Op Procedure(s) (LRB): LEFT TOTAL KNEE ARTHROPLASTY (Left) Active Problems:   OA (osteoarthritis) of knee  Estimated body mass index is 33.83 kg/(m^2) as calculated from the following:   Height as of this encounter: 5\' 2"  (1.575 m).   Weight as of this encounter: 83.915 kg (185 lb). Up with therapy  DVT Prophylaxis - Xarelto Weight-Bearing as tolerated to left leg D/C O2 and Pulse OX and try on Room Air  Arlee Muslim, PA-C Orthopaedic Surgery 09/12/2015, 9:39 AM

## 2015-09-12 NOTE — Plan of Care (Signed)
Problem: Consults Goal: Diagnosis- Total Joint Replacement Outcome: Completed/Met Date Met:  09/12/15 Primary Total Knee LEFT

## 2015-09-12 NOTE — Care Management Note (Signed)
Case Management Note  Patient Details  Name: SIGNA CHEEK MRN: [id number] Date of Birth: April 23, 1934  Subjective/Objective:                   LEFT TOTAL KNEE ARTHROPLASTY (Left) Action/Plan: Discharge planning  Expected Discharge Date:  09/13/15               Expected Discharge Plan:  [place]  In-House Referral:     Discharge planning Services  CM Consult  Post Acute Care Choice:  Home Health Choice offered to:  Patient  DME Arranged:  Walker rolling DME Agency:  [place]:  PT [facility] Agency:  [place]  Status of Service:  Completed, signed off  Medicare Important Message Given:    Date Medicare IM Given:    Medicare IM give by:    Date Additional Medicare IM Given:    Additional Medicare Important Message give by:     If discussed at [place] of Stay Meetings, dates discussed:    Additional Comments: CM met with pt in room to offer choice of home health agency.  Pt chooses [facility] to render HHPT.  Address and contact information verified by pt.  Referral emailed to [employer], Tim.  Pt states she has a 3n1 but needs a rolling walker.  CM called [facility] DME rep, Lecretia to please deliver the rolling walker to room prior to discharge.  No other CM needs were communicated. Dellie Catholic, RN 09/12/2015, 10:28 AM

## 2015-09-12 NOTE — Evaluation (Signed)
Physical Therapy Evaluation Patient Details Name: [name] MRN: [id number] DOB: [birth year] Today's Date: 09/12/2015   History of Present Illness  Pt is s/p L TKA. Pt with history of CVA, impaired hearing, DM, HTN, and vertigo.   Clinical Impression  Pt s/p L TKR presents with decreased L LE strength/ROM and post op pain limiting functional mobility.  Pt should progress to dc home with 24/7 assist and HHPT follow up.    Follow Up Recommendations Home health PT    Equipment Recommendations  Rolling walker with 5" wheels    Recommendations for Other Services OT consult     Precautions / Restrictions Precautions Precautions: Fall;Knee Required Braces or Orthoses: Knee Immobilizer - Left Knee Immobilizer - Left: Discontinue once straight leg raise with < 10 degree lag Restrictions Weight Bearing Restrictions: No Other Position/Activity Restrictions: WBAT      Mobility  Bed Mobility Overal bed mobility: Needs Assistance Bed Mobility: Supine to Sit     Supine to sit: Mod assist     General bed mobility comments: in chair.   Transfers Overall transfer level: Needs assistance Equipment used: Rolling walker (2 wheeled) Transfers: Sit to/from Stand Sit to Stand: Min assist         General transfer comment: cues for hand placement and LE management.   Ambulation/Gait Ambulation/Gait assistance: Min assist;Mod assist Ambulation Distance (Feet): 40 Feet Assistive device: Rolling walker (2 wheeled) Gait Pattern/deviations: Step-to pattern;Decreased step length - right;Decreased step length - left;Shuffle;Trunk flexed Gait velocity: decr   General Gait Details: cues for sequence, posture and position from [employer]            Wheelchair Mobility    Modified Rankin (Stroke Patients Only)       Balance Overall balance assessment: Needs assistance   Sitting balance-Leahy Scale: Good       Standing balance-Leahy Scale: Poor                                Pertinent Vitals/Pain Pain Assessment: Faces Pain Score: 5  Faces Pain Scale: Hurts a little bit Pain Location: L knee Pain Descriptors / Indicators: Sore Pain Intervention(s): Repositioned;Ice applied    Home Living Family/patient expects to be discharged to:: Private residence Living Arrangements: Spouse/significant other Available Help at Discharge: Personal care attendant;Family Type of Home: House Home Access: Stairs to enter Entrance Stairs-Rails: Right;Left;Can reach both Secretary/administrator of Steps: 4 Home Layout: Two level Home Equipment: Shower seat;Bedside commode;Walker - 2 wheels      Prior Function Level of Independence: Independent               Hand Dominance        Extremity/Trunk Assessment   Upper Extremity Assessment: Overall WFL for tasks assessed           Lower Extremity Assessment: LLE deficits/detail   LLE Deficits / Details: 2/5 quads with AAROM at knee -10 - 35  Cervical / Trunk Assessment: Kyphotic  Communication   Communication: HOH  Cognition Arousal/Alertness: Awake/alert Behavior During Therapy: WFL for tasks assessed/performed Overall Cognitive Status: Within Functional Limits for tasks assessed                      General Comments      Exercises Total Joint Exercises Ankle Circles/Pumps: AROM;Both;15 reps;Supine Quad Sets: AROM;Both;10 reps;Supine Heel Slides: AAROM;Left;15 reps;Supine Straight Leg Raises: AAROM;Left;10 reps;Supine  Assessment/Plan    PT Assessment Patient needs continued PT services  PT Diagnosis Difficulty walking   PT Problem List Decreased strength;Decreased range of motion;Decreased activity tolerance;Decreased mobility;Decreased knowledge of use of DME;Pain;Obesity  PT Treatment Interventions DME instruction;Gait training;Stair training;Functional mobility training;Therapeutic activities;Therapeutic exercise;Patient/family education   PT Goals  (Current goals can be found in the Care Plan section) Acute Rehab PT Goals Patient Stated Goal: none stated but agreeable to get up with OT. PT Goal Formulation: With patient Time For Goal Achievement: 09/16/15 Potential to Achieve Goals: Good    Frequency 7X/week   Barriers to discharge        Co-evaluation               End of Session Equipment Utilized During Treatment: Gait belt;Left knee immobilizer Activity Tolerance: Patient tolerated treatment well Patient left: in chair;with call bell/phone within reach;with family/visitor present Nurse Communication: Mobility status         Time: 1610-9604 PT Time Calculation (min) (ACUTE ONLY): 57 min   Charges:   PT Evaluation $Initial PT Evaluation Tier I: 1 Procedure PT Treatments $Gait Training: 8-22 mins $Therapeutic Exercise: 8-22 mins   PT G Codes:        Tomeka Kantner 22-Sep-2015, 12:24 PM

## 2015-09-13 LAB — BASIC METABOLIC PANEL
ANION GAP: 7 (ref 5–15)
BUN: 15 mg/dL (ref 6–20)
CO2: 29 mmol/L (ref 22–32)
Calcium: 8.9 mg/dL (ref 8.9–10.3)
Chloride: 102 mmol/L (ref 101–111)
Creatinine, Ser: 0.73 mg/dL (ref 0.44–1.00)
GFR calc Af Amer: >60 mL/min (ref >60)
Glucose, Bld: 241 mg/dL — ABNORMAL HIGH (ref 65–99)
POTASSIUM: 3.9 mmol/L (ref 3.5–5.1)
SODIUM: 138 mmol/L (ref 135–145)

## 2015-09-13 LAB — GLUCOSE, CAPILLARY
GLUCOSE-CAPILLARY: 231 mg/dL — AB (ref 65–99)
GLUCOSE-CAPILLARY: 236 mg/dL — AB (ref 65–99)
GLUCOSE-CAPILLARY: 227 mg/dL — AB (ref 65–99)
GLUCOSE-CAPILLARY: 206 mg/dL — AB (ref 65–99)
GLUCOSE-CAPILLARY: 207 mg/dL — AB (ref 65–99)

## 2015-09-13 LAB — CBC
HEMATOCRIT: 32.7 % — AB (ref 36.0–46.0)
HEMOGLOBIN: 11 g/dL — AB (ref 12.0–15.0)
MCH: 30.1 pg (ref 26.0–34.0)
MCHC: 33.6 g/dL (ref 30.0–36.0)
MCV: 89.3 fL (ref 78.0–100.0)
Platelets: 157 10*3/uL (ref 150–400)
RBC: 3.66 MIL/uL — ABNORMAL LOW (ref 3.87–5.11)
RDW: 12.6 % (ref 11.5–15.5)
WBC: 9.6 10*3/uL (ref 4.0–10.5)

## 2015-09-13 NOTE — Progress Notes (Signed)
Physical Therapy Treatment Patient Details Name: [name] MRN: [id number] DOB: [birth year] Today's Date: 09/13/2015    History of Present Illness Pt is s/p L TKA. Pt with history of CVA, impaired hearing, DM, HTN, and vertigo.     PT Comments    Improvement in activity tolerance this pm but pt continues to require increased time all tasks.  Follow Up Recommendations  Home health PT     Equipment Recommendations  Rolling walker with 5" wheels    Recommendations for Other Services OT consult     Precautions / Restrictions Precautions Precautions: Fall;Knee Required Braces or Orthoses: Knee Immobilizer - Left Knee Immobilizer - Left: Discontinue once straight leg raise with < 10 degree lag Restrictions Weight Bearing Restrictions: No Other Position/Activity Restrictions: WBAT    Mobility  Bed Mobility Overal bed mobility: Needs Assistance Bed Mobility: Sit to Supine;Supine to Sit     Supine to sit: Min assist Sit to supine: Min assist   General bed mobility comments: increased time, cues for sequence and use of R LE to self assist  Transfers Overall transfer level: Needs assistance Equipment used: Rolling walker (2 wheeled) Transfers: Sit to/from Stand Sit to Stand: Min assist         General transfer comment: cues for hand placement and LE management.  Ambulation/Gait Ambulation/Gait assistance: Min assist Ambulation Distance (Feet): 74 Feet Assistive device: Rolling walker (2 wheeled) Gait Pattern/deviations: Step-to pattern;Decreased step length - right;Decreased step length - left;Shuffle;Trunk flexed Gait velocity: decr   General Gait Details: cues for sequence, posture and position from [employer]            Wheelchair Mobility    Modified Rankin (Stroke Patients Only)       Balance     Sitting balance-Leahy Scale: Good       Standing balance-Leahy Scale: Poor                      Cognition Arousal/Alertness:  Awake/alert Behavior During Therapy: WFL for tasks assessed/performed Overall Cognitive Status: Within Functional Limits for tasks assessed                      Exercises Total Joint Exercises Ankle Circles/Pumps: AROM;Both;15 reps;Supine Quad Sets: AROM;Both;Supine;10 reps Heel Slides: AAROM;Left;Supine;10 reps Straight Leg Raises: AAROM;Left;Supine;10 reps Goniometric ROM: AAROM at L knee -10- 25 with ++muscle guarding    General Comments        Pertinent Vitals/Pain Pain Assessment: 0-10 Pain Score: 4  Pain Location: L knee Pain Descriptors / Indicators: Aching;Sore Pain Intervention(s): Limited activity within patient's tolerance;Monitored during session;Premedicated before session;Ice applied    Home Living                      Prior Function            PT Goals (current goals can now be found in the care plan section) Acute Rehab PT Goals Patient Stated Goal: nap PT Goal Formulation: With patient Time For Goal Achievement: 09/16/15 Potential to Achieve Goals: Good Progress towards PT goals: Progressing toward goals    Frequency  7X/week    PT Plan Current plan remains appropriate    Co-evaluation             End of Session Equipment Utilized During Treatment: Gait belt;Left knee immobilizer Activity Tolerance: Patient limited by fatigue Patient left: in bed;with call bell/phone within reach;with family/visitor present  Time: 1440-1510 PT Time Calculation (min) (ACUTE ONLY): 30 min  Charges:  $Gait Training: 23-37 mins $Therapeutic Exercise: 8-22 mins $Therapeutic Activity: 8-22 mins                    G Codes:      Athanasios Heldman 10/06/2015, 3:17 PM

## 2015-09-13 NOTE — Progress Notes (Signed)
Occupational Therapy Treatment Patient Details Name: [name] MRN: [id number] DOB: 06-18-1934 Today's Date: 09/13/2015    History of present illness Pt is s/p L TKA. Pt with history of CVA, impaired hearing, DM, HTN, and vertigo.    OT comments  Pt up to the bathroom to transfer on and off 3in1 and was fatigued after this limited amount of activity. BP sitting in recliner 127/64. Daughter present. Will follow to progress ADL independence for d/c home with family.    Follow Up Recommendations  No OT follow up;Supervision/Assistance - 24 hour    Equipment Recommendations  None recommended by OT    Recommendations for Other Services      Precautions / Restrictions Precautions Precautions: Fall;Knee Required Braces or Orthoses: Knee Immobilizer - Left Knee Immobilizer - Left: Discontinue once straight leg raise with < 10 degree lag Restrictions Weight Bearing Restrictions: No Other Position/Activity Restrictions: WBAT       Mobility Bed Mobility Overal bed mobility: Needs Assistance Bed Mobility: Supine to Sit     Supine to sit: Min assist     General bed mobility comments: increased time. min assist to support L LE over to EOB. cues for hand placement to self assist.   Transfers Overall transfer level: Needs assistance Equipment used: Rolling walker (2 wheeled) Transfers: Sit to/from Stand Sit to Stand: Min assist         General transfer comment: cues for hand placement and LE management.    Balance                                   ADL                           Toilet Transfer: Minimal assistance;Ambulation;BSC;RW   Toileting- Clothing Manipulation and Hygiene: Minimal assistance;Moderate assistance;Sit to/from stand         General ADL Comments: Pt's daughter present and stating the plan is still for home. Pt reporting that transfer to 3in1 and to the chair is tiring. She reports some lightheadedness on the way to the  chair. BP sitting in chair 127/64. Pt stating her arms, neck and L LE are sore. Daughter asking about theraband and explained can issue and educate on some exercises to build UE strength. Pt and daughter have decided not to purchase AE for LB dressing and just have assist.       Vision                     Perception     Praxis      Cognition   Behavior During Therapy: [facility] for tasks assessed/performed Overall Cognitive Status: Within Functional Limits for tasks assessed                       Extremity/Trunk Assessment               Exercises     Shoulder Instructions       General Comments      Pertinent Vitals/ Pain       Pain Assessment: 0-10 Pain Score: 3  Pain Location: L knee, neck,, bilateral shoulder. Pain Descriptors / Indicators: Aching;Sore Pain Intervention(s): Repositioned;Ice applied  Home Living  Prior Functioning/Environment              Frequency Min 2X/week     Progress Toward Goals  OT Goals(current goals can now be found in the care plan section)  Progress towards OT goals: Progressing toward goals     Plan Discharge plan remains appropriate    Co-evaluation                 End of Session Equipment Utilized During Treatment: Gait belt;Rolling walker;Left knee immobilizer CPM Left Knee CPM Left Knee: Off   Activity Tolerance Patient limited by fatigue   Patient Left in chair;with call bell/phone within reach;with family/visitor present   Nurse Communication          Time: 4098-1191 OT Time Calculation (min): 37 min  Charges: OT General Charges $OT Visit: 1 Procedure OT Treatments $Self Care/Home Management : 8-22 mins $Therapeutic Activity: 8-22 mins  [name]  [phone] 09/13/2015, 11:26 AM

## 2015-09-13 NOTE — Progress Notes (Signed)
Inpatient Diabetes Program Recommendations  AACE/ADA: New Consensus Statement on Inpatient Glycemic Control (2015)  Target Ranges:  Prepandial:   less than 140 mg/dL      Peak postprandial:   less than 180 mg/dL (1-2 hours)      Critically ill patients:  140 - 180 mg/dL   Review of Glycemic Control  Diabetes history: DM2 Outpatient Diabetes medications: Lantus 50 units QHS, Glipizide 10 mg QAM, metformin 1000 mg bid Current orders for Inpatient glycemic control: Lantus 44 units QHS, Novolog moderate tidwc  Results for TARIANA, MOLDOVAN (MRN [id number]) as of 09/13/2015 14:17  Ref. Range 09/12/2015 17:36 09/12/2015 21:44 09/13/2015 07:29 09/13/2015 11:22 09/13/2015 13:18  Glucose-Capillary Latest Ref Range: 65-99 mg/dL 254 (H) 228 (H) 231 (H) 236 (H) 227 (H)  Results for LORALEI, RADCLIFFE (MRN [id number]) as of 09/13/2015 14:17  Ref. Range 09/13/2015 04:43  Sodium Latest Ref Range: 135-145 mmol/L 138  Potassium Latest Ref Range: 3.5-5.1 mmol/L 3.9  Chloride Latest Ref Range: 101-111 mmol/L 102  CO2 Latest Ref Range: 22-32 mmol/L 29  BUN Latest Ref Range: 6-20 mg/dL 15  Creatinine Latest Ref Range: 0.44-1.00 mg/dL 0.73  Calcium Latest Ref Range: 8.9-10.3 mg/dL 8.9  EGFR (Non-African Amer.) Latest Ref Range: >60 mL/min >60  EGFR (African American) Latest Ref Range: >60 mL/min >60  Glucose Latest Ref Range: 65-99 mg/dL 241 (H)  Anion gap Latest Ref Range: 5-15  7    Inpatient Diabetes Program Recommendations: Insulin - Basal: Increase Lantus to 48 units QHS Correction (SSI): Increase Novolog to resistant tidwc and hs   Will follow. Thank you. Lorenda Peck, RD, LDN, CDE Inpatient Diabetes Coordinator [phone]

## 2015-09-13 NOTE — Progress Notes (Signed)
Physical Therapy Treatment Patient Details Name: [name] MRN: [id number] DOB: January 25, 1934 Today's Date: 09/13/2015    History of Present Illness Pt is s/p L TKA. Pt with history of CVA, impaired hearing, DM, HTN, and vertigo.     PT Comments    Slow progress with pt c/o increased fatigue and aching all over.  Follow Up Recommendations  Home health PT     Equipment Recommendations  Rolling walker with 5" wheels    Recommendations for Other Services OT consult     Precautions / Restrictions Precautions Precautions: Fall;Knee Required Braces or Orthoses: Knee Immobilizer - Left Knee Immobilizer - Left: Discontinue once straight leg raise with < 10 degree lag Restrictions Weight Bearing Restrictions: No Other Position/Activity Restrictions: WBAT    Mobility  Bed Mobility Overal bed mobility: Needs Assistance Bed Mobility: Sit to Supine     Supine to sit: Min assist Sit to supine: Min assist   General bed mobility comments: increased time, cues for sequence and use of R LE to self assist  Transfers Overall transfer level: Needs assistance Equipment used: Rolling walker (2 wheeled) Transfers: Sit to/from Stand Sit to Stand: Min assist         General transfer comment: cues for hand placement and LE management.  Ambulation/Gait Ambulation/Gait assistance: Min assist Ambulation Distance (Feet): 6 Feet Assistive device: Rolling walker (2 wheeled) Gait Pattern/deviations: Step-to pattern;Decreased step length - right;Decreased step length - left;Shuffle;Trunk flexed Gait velocity: decr   General Gait Details: cues for sequence, posture and position from [employer]            Wheelchair Mobility    Modified Rankin (Stroke Patients Only)       Balance     Sitting balance-Leahy Scale: Good       Standing balance-Leahy Scale: Poor                      Cognition Arousal/Alertness: Awake/alert Behavior During Therapy: WFL for  tasks assessed/performed Overall Cognitive Status: Within Functional Limits for tasks assessed                      Exercises Total Joint Exercises Ankle Circles/Pumps: AROM;Both;15 reps;Supine Quad Sets: AROM;Both;Supine;10 reps Heel Slides: AAROM;Left;Supine;10 reps Straight Leg Raises: AAROM;Left;Supine;10 reps Goniometric ROM: AAROM at L knee -10- 25 with ++muscle guarding    General Comments        Pertinent Vitals/Pain Pain Assessment: 0-10 Pain Score: 5  Pain Location: L knee, neck, bilat shoulders Pain Descriptors / Indicators: Aching;Sore Pain Intervention(s): Limited activity within patient's tolerance;Monitored during session;Patient requesting pain meds-RN notified;Ice applied    Home Living                      Prior Function            PT Goals (current goals can now be found in the care plan section) Acute Rehab PT Goals Patient Stated Goal: nap PT Goal Formulation: With patient Time For Goal Achievement: 09/16/15 Potential to Achieve Goals: Good Progress towards PT goals: Progressing toward goals    Frequency  7X/week    PT Plan Current plan remains appropriate    Co-evaluation             End of Session Equipment Utilized During Treatment: Gait belt;Left knee immobilizer Activity Tolerance: Patient limited by fatigue;Patient limited by pain Patient left: in bed;with call bell/phone within reach;with family/visitor present  Time: 1126-1150 PT Time Calculation (min) (ACUTE ONLY): 24 min  Charges:  $Therapeutic Exercise: 8-22 mins $Therapeutic Activity: 8-22 mins                    G Codes:      Aayden Cefalu 09-Oct-2015, 12:29 PM

## 2015-09-13 NOTE — Care Management Important Message (Signed)
Important Message  Patient Details  Name: JOCABED CHEESE MRN: [id number] Date of Birth: Mar 13, 1934   Medicare Important Message Given:  Yes-second notification given    Camillo Flaming 09/13/2015, 11:45 AMImportant Message  Patient Details  Name: KARENA KINKER MRN: [id number] Date of Birth: [birth year]   Medicare Important Message Given:  Yes-second notification given    Camillo Flaming 09/13/2015, 11:45 AM

## 2015-09-13 NOTE — Progress Notes (Addendum)
Subjective: 2 Days Post-Op Procedure(s) (LRB): LEFT TOTAL KNEE ARTHROPLASTY (Left) Patient reports pain as moderate.   Patient seen in rounds for Dr. Wynelle Link. She had a rough night with pain and not much sleep. Took pain meds last night before bed but nothing since then as per patient. Did not sleep much.  Work on better pain control this morning. Patient is having problems with pain in the knee, requiring pain medications Plan is to go Home after hospital stay as per patient.  She states that she feels that she would be better at home. Keep today and work on pain control and mobility.  Looks like she got up once yesterday.  Objective: Vital signs in last 24 hours: Temp:  [98.3 F (36.8 C)-99 F (37.2 C)] 98.4 F (36.9 C) (09/14 0525) Pulse Rate:  [72-94] 94 (09/14 0525) Resp:  [16-18] 17 (09/14 0525) BP: (117-166)/(47-62) 166/62 mmHg (09/14 0525) SpO2:  [93 %-96 %] 96 % (09/14 0525)  Intake/Output from previous day:  Intake/Output Summary (Last 24 hours) at 09/13/15 0750 Last data filed at 09/12/15 2204  Gross per 24 hour  Intake  869.5 ml  Output   1300 ml  Net -430.5 ml    Labs:  Recent Labs  09/12/15 0434 09/13/15 0443  HGB 11.7* 11.0*    Recent Labs  09/12/15 0434 09/13/15 0443  WBC 9.5 9.6  RBC 3.90 3.66*  HCT 34.7* 32.7*  PLT 150 157    Recent Labs  09/12/15 0434 09/13/15 0443  NA 135 138  K 4.3 3.9  CL 104 102  CO2 24 29  BUN 14 15  CREATININE 0.64 0.73  GLUCOSE 250* 241*  CALCIUM 8.8* 8.9    Recent Labs  09/11/15 0735  INR 1.06    EXAM General - Patient is Alert and Appropriate Extremity - Neurovascular intact Sensation intact distally Dorsiflexion/Plantar flexion intact Dressing/Incision - clean, dry, no drainage, healing Motor Function - intact, moving foot and toes well on exam.   Past Medical History  Diagnosis Date  . Long term (current) use of anticoagulants 11/02/2013    Pradaxa   . Atrial flutter 11/02/2013  .  Essential hypertension, benign 11/02/2013  . Coronary artery disease   . Vertigo     being monitored, occasional  . High cholesterol     under control  . Heart disease   . Anginal pain     "saw doctor-think it was esophagus spasm"  . Stroke 2012  . Numbness and tingling     lips  . Mouth dryness   . Hearing trouble   . Diabetes mellitus without complication     type 2  . Headache 3614    complicated migraine x2   . H/O jaundice     as child  . Hepatitis     hx of hepatitis A as child  . Arthritis   . Psoriasis   . Psoriatic arthritis   . Osteoporosis     "unsure-maybe in knees"  . Cataract     left eye  . Mumps     as teenager  . Measles     as child    Assessment/Plan: 2 Days Post-Op Procedure(s) (LRB): LEFT TOTAL KNEE ARTHROPLASTY (Left) Active Problems:   OA (osteoarthritis) of knee  Estimated body mass index is 33.83 kg/(m^2) as calculated from the following:   Height as of this encounter: 5\' 2"  (1.575 m).   Weight as of this encounter: 83.915 kg (185 lb). Up with therapy  and pain control measures Plan for discharge tomorrow if improves Discharge home with home health  DVT Prophylaxis - Xarelto Weight-Bearing as tolerated to left leg Encourage pain meds ICE to the knee for better pain control also.  Arlee Muslim, PA-C Orthopaedic Surgery 09/13/2015, 7:50 AM

## 2015-09-14 LAB — CBC
HEMATOCRIT: 31.2 % — AB (ref 36.0–46.0)
HEMOGLOBIN: 10.3 g/dL — AB (ref 12.0–15.0)
MCH: 28.9 pg (ref 26.0–34.0)
MCHC: 33 g/dL (ref 30.0–36.0)
MCV: 87.6 fL (ref 78.0–100.0)
Platelets: 155 10*3/uL (ref 150–400)
RBC: 3.56 MIL/uL — AB (ref 3.87–5.11)
RDW: 12.5 % (ref 11.5–15.5)
WBC: 8.8 10*3/uL (ref 4.0–10.5)

## 2015-09-14 LAB — GLUCOSE, CAPILLARY
Glucose-Capillary: 175 mg/dL — ABNORMAL HIGH (ref 65–99)
Glucose-Capillary: 235 mg/dL — ABNORMAL HIGH (ref 65–99)

## 2015-09-14 MED ORDER — METHOCARBAMOL 500 MG PO TABS: 500.0000 mg | ORAL_TABLET | Freq: Four times a day (QID) | ORAL | Status: DC | PRN

## 2015-09-14 MED ORDER — TRAMADOL HCL 50 MG PO TABS: 50.0000 mg | ORAL_TABLET | Freq: Four times a day (QID) | ORAL | Status: DC | PRN

## 2015-09-14 NOTE — Progress Notes (Addendum)
Patient requested pain med and muscle relaxer and when RN went into room to administer they didn't want muscle relaxer and only wanted 1 tablet of tramadol 50mg . RN went to return them back to [facility] and patient had been discharged and name was out of [facility]. Pharmacy was consulted and said to waste with another RN or return to [facility] as inventory.  Medication was wasted in sharpes container witnessed by Isabell Jarvis.  COSIGN:  Kathlyn [place], RN

## 2015-09-14 NOTE — Progress Notes (Signed)
Physical Therapy Treatment Patient Details Name: [name] MRN: [id number] DOB: 1934-02-01 Today's Date: 09/14/2015    History of Present Illness Pt is s/p L TKA. Pt with history of CVA, impaired hearing, DM, HTN, and vertigo.     PT Comments    POD # 3 assisted pt OOB to amb a short distance, practice stairs, assisted to BR then positioned in recliner.   Pt given handout HEP and instructed on use of ICE.  Pt ready for D/C to home.   Follow Up Recommendations  Home health PT     Equipment Recommendations  Rolling walker with 5" wheels    Recommendations for Other Services       Precautions / Restrictions Precautions Precautions: Fall;Knee Precaution Comments: instructed on KI use for amb/stairs and side sleeping Required Braces or Orthoses: Knee Immobilizer - Left Knee Immobilizer - Left: Discontinue once straight leg raise with < 10 degree lag Restrictions Weight Bearing Restrictions: No Other Position/Activity Restrictions: WBAT    Mobility  Bed Mobility Overal bed mobility: Needs Assistance Bed Mobility: Supine to Sit     Supine to sit: Min assist Sit to supine: Min assist   General bed mobility comments: assist L LE and increased time to scoot  Transfers Overall transfer level: Needs assistance Equipment used: Rolling walker (2 wheeled) Transfers: Sit to/from Stand Sit to Stand: Min guard;Supervision         General transfer comment: cues for hand placement and LE management plus cueing for direction  Ambulation/Gait Ambulation/Gait assistance: Min guard;Supervision Ambulation Distance (Feet): 25 Feet Assistive device: Rolling walker (2 wheeled) Gait Pattern/deviations: Step-to pattern;Decreased stance time - left Gait velocity: decr   General Gait Details: cues for sequence, posture and position from [employer] Stairs: Yes Stairs assistance: Min assist Stair Management: Two rails;Step to pattern;Forwards Number of Stairs: 2 General  stair comments: 25% VC's on proper sequencing and safety.   Wheelchair Mobility    Modified Rankin (Stroke Patients Only)       Balance                                    Cognition Arousal/Alertness: Awake/alert Behavior During Therapy: WFL for tasks assessed/performed Overall Cognitive Status: Within Functional Limits for tasks assessed                      Exercises      General Comments        Pertinent Vitals/Pain Pain Assessment: 0-10 Pain Score: 3  Pain Location: L knee Pain Descriptors / Indicators: Sore Pain Intervention(s): Premedicated before session;Repositioned;Ice applied    Home Living                      Prior Function            PT Goals (current goals can now be found in the care plan section) Progress towards PT goals: Progressing toward goals    Frequency  7X/week    PT Plan Current plan remains appropriate    Co-evaluation             End of Session Equipment Utilized During Treatment: Gait belt;Left knee immobilizer Activity Tolerance: Patient tolerated treatment well Patient left: in chair;with call bell/phone within reach;with family/visitor present     Time: 1120-1200 PT Time Calculation (min) (ACUTE ONLY): 40 min  Charges:  $Gait Training: 23-37 mins $  Therapeutic Activity: 8-22 mins                    G Codes:      Felecia Shelling  PTA [facility]  Acute  Rehab Pager      [phone]

## 2015-09-14 NOTE — Discharge Summary (Signed)
Physician Discharge Summary   Patient ID: [name] MRN: [id number] DOB/AGE: 1934-09-06 81 y.o.  Admit date: 09/11/2015 Discharge date: 09-14-2015  Primary Diagnosis:  Osteoarthritis Left knee(s)  Admission Diagnoses:  Past Medical History  Diagnosis Date  . Long term (current) use of anticoagulants 11/02/2013    Pradaxa   . Atrial flutter 11/02/2013  . Essential hypertension, benign 11/02/2013  . Coronary artery disease   . Vertigo     being monitored, occasional  . High cholesterol     under control  . Heart disease   . Anginal pain     "saw doctor-think it was esophagus spasm"  . Stroke 2012  . Numbness and tingling     lips  . Mouth dryness   . Hearing trouble   . Diabetes mellitus without complication     type 2  . Headache 2015    complicated migraine x2   . H/O jaundice     as child  . Hepatitis     hx of hepatitis A as child  . Arthritis   . Psoriasis   . Psoriatic arthritis   . Osteoporosis     "unsure-maybe in knees"  . Cataract     left eye  . Mumps     as teenager  . Measles     as child   Discharge Diagnoses:   Active Problems:   OA (osteoarthritis) of knee  Estimated body mass index is 33.83 kg/(m^2) as calculated from the following:   Height as of this encounter: 5\' 2"  (1.575 m).   Weight as of this encounter: 83.915 kg (185 lb).  Procedure:  Procedure(s) (LRB): LEFT TOTAL KNEE ARTHROPLASTY (Left)   Consults: None  HPI: [name] is a 81 y.o. year old female with end stage OA of her left knee with progressively worsening pain and dysfunction. She has constant pain, with activity and at rest and significant functional deficits with difficulties even with ADLs. She has had extensive non-op management including analgesics, injections of cortisone, and home exercise program, but remains in significant pain with significant dysfunction. Radiographs show bone on bone arthritis medial and patellofemoral. She presents now for left Total  Knee Arthroplasty.   Laboratory Data: Admission on 09/11/2015  Component Date Value Ref Range Status  . Glucose-Capillary 09/11/2015 133* 65 - 99 mg/dL Final  . Comment 1 78/46/9629 Notify RN   Final  . Prothrombin Time 09/11/2015 14.0  11.6 - 15.2 seconds Final  . INR 09/11/2015 1.06  0.00 - 1.49 Final  . Glucose-Capillary 09/11/2015 159* 65 - 99 mg/dL Final  . Comment 1 52/84/1324 Notify RN   Final  . Comment 2 09/11/2015 Document in Chart   Final  . Sodium 09/12/2015 135  135 - 145 mmol/L Final  . Potassium 09/12/2015 4.3  3.5 - 5.1 mmol/L Final  . Chloride 09/12/2015 104  101 - 111 mmol/L Final  . CO2 09/12/2015 24  22 - 32 mmol/L Final  . Glucose, Bld 09/12/2015 250* 65 - 99 mg/dL Final  . BUN 40/09/2724 14  6 - 20 mg/dL Final  . Creatinine, Ser 09/12/2015 0.64  0.44 - 1.00 mg/dL Final  . Calcium 36/64/4034 8.8* 8.9 - 10.3 mg/dL Final  . GFR calc non Af Amer 09/12/2015 >60  >60 mL/min Final  . GFR calc Af Amer 09/12/2015 >60  >60 mL/min Final   Comment: (NOTE) The eGFR has been calculated using the CKD EPI equation. This calculation has not been validated in all  clinical situations. eGFR's persistently <60 mL/min signify possible Chronic Kidney Disease.   . Anion gap 09/12/2015 7  5 - 15 Final  . WBC 09/12/2015 9.5  4.0 - 10.5 K/uL Final  . RBC 09/12/2015 3.90  3.87 - 5.11 MIL/uL Final  . Hemoglobin 09/12/2015 11.7* 12.0 - 15.0 g/dL Final  . HCT 85/46/2703 34.7* 36.0 - 46.0 % Final  . MCV 09/12/2015 89.0  78.0 - 100.0 fL Final  . MCH 09/12/2015 30.0  26.0 - 34.0 pg Final  . MCHC 09/12/2015 33.7  30.0 - 36.0 g/dL Final  . RDW 50/08/3817 12.5  11.5 - 15.5 % Final  . Platelets 09/12/2015 150  150 - 400 K/uL Final  . Glucose-Capillary 09/11/2015 163* 65 - 99 mg/dL Final  . Glucose-Capillary 09/11/2015 253* 65 - 99 mg/dL Final  . Glucose-Capillary 09/12/2015 213* 65 - 99 mg/dL Final  . Glucose-Capillary 09/12/2015 258* 65 - 99 mg/dL Final  . Sodium 29/93/7169 138  135 - 145  mmol/L Final  . Potassium 09/13/2015 3.9  3.5 - 5.1 mmol/L Final  . Chloride 09/13/2015 102  101 - 111 mmol/L Final  . CO2 09/13/2015 29  22 - 32 mmol/L Final  . Glucose, Bld 09/13/2015 241* 65 - 99 mg/dL Final  . BUN 67/89/3810 15  6 - 20 mg/dL Final  . Creatinine, Ser 09/13/2015 0.73  0.44 - 1.00 mg/dL Final  . Calcium 17/51/0258 8.9  8.9 - 10.3 mg/dL Final  . GFR calc non Af Amer 09/13/2015 >60  >60 mL/min Final  . GFR calc Af Amer 09/13/2015 >60  >60 mL/min Final   Comment: (NOTE) The eGFR has been calculated using the CKD EPI equation. This calculation has not been validated in all clinical situations. eGFR's persistently <60 mL/min signify possible Chronic Kidney Disease.   . Anion gap 09/13/2015 7  5 - 15 Final  . WBC 09/13/2015 9.6  4.0 - 10.5 K/uL Final  . RBC 09/13/2015 3.66* 3.87 - 5.11 MIL/uL Final  . Hemoglobin 09/13/2015 11.0* 12.0 - 15.0 g/dL Final  . HCT 52/77/8242 32.7* 36.0 - 46.0 % Final  . MCV 09/13/2015 89.3  78.0 - 100.0 fL Final  . MCH 09/13/2015 30.1  26.0 - 34.0 pg Final  . MCHC 09/13/2015 33.6  30.0 - 36.0 g/dL Final  . RDW 35/36/1443 12.6  11.5 - 15.5 % Final  . Platelets 09/13/2015 157  150 - 400 K/uL Final  . Glucose-Capillary 09/12/2015 254* 65 - 99 mg/dL Final  . Glucose-Capillary 09/12/2015 228* 65 - 99 mg/dL Final  . Glucose-Capillary 09/13/2015 231* 65 - 99 mg/dL Final  . Glucose-Capillary 09/13/2015 236* 65 - 99 mg/dL Final  . Glucose-Capillary 09/13/2015 227* 65 - 99 mg/dL Final  . WBC 15/40/0867 8.8  4.0 - 10.5 K/uL Final  . RBC 09/14/2015 3.56* 3.87 - 5.11 MIL/uL Final  . Hemoglobin 09/14/2015 10.3* 12.0 - 15.0 g/dL Final  . HCT 61/95/0932 31.2* 36.0 - 46.0 % Final  . MCV 09/14/2015 87.6  78.0 - 100.0 fL Final  . MCH 09/14/2015 28.9  26.0 - 34.0 pg Final  . MCHC 09/14/2015 33.0  30.0 - 36.0 g/dL Final  . RDW 67/11/4579 12.5  11.5 - 15.5 % Final  . Platelets 09/14/2015 155  150 - 400 K/uL Final  . Glucose-Capillary 09/13/2015 206* 65 - 99  mg/dL Final  . Glucose-Capillary 09/13/2015 207* 65 - 99 mg/dL Final  . Glucose-Capillary 09/14/2015 175* 65 - 99 mg/dL Final  Hospital Outpatient Visit on 09/05/2015  Component Date Value Ref Range Status  . MRSA, PCR 09/05/2015 NEGATIVE  NEGATIVE Final  . Staphylococcus aureus 09/05/2015 NEGATIVE  NEGATIVE Final   Comment:        The Xpert SA Assay (FDA approved for NASAL specimens in patients over 83 years of age), is one component of a comprehensive surveillance program.  Test performance has been validated by [facility] for patients greater than or equal to 27 year old. It is not intended to diagnose infection nor to guide or monitor treatment.   Marland Kitchen aPTT 09/05/2015 60* 24 - 37 seconds Final   Comment:        IF BASELINE aPTT IS ELEVATED, SUGGEST PATIENT RISK ASSESSMENT BE USED TO DETERMINE APPROPRIATE ANTICOAGULANT THERAPY.   . WBC 09/05/2015 7.0  4.0 - 10.5 K/uL Final  . RBC 09/05/2015 4.19  3.87 - 5.11 MIL/uL Final  . Hemoglobin 09/05/2015 12.4  12.0 - 15.0 g/dL Final  . HCT 16/09/9603 37.7  36.0 - 46.0 % Final  . MCV 09/05/2015 90.0  78.0 - 100.0 fL Final  . MCH 09/05/2015 29.6  26.0 - 34.0 pg Final  . MCHC 09/05/2015 32.9  30.0 - 36.0 g/dL Final  . RDW 54/08/8118 12.6  11.5 - 15.5 % Final  . Platelets 09/05/2015 157  150 - 400 K/uL Final  . Sodium 09/05/2015 140  135 - 145 mmol/L Final  . Potassium 09/05/2015 4.6  3.5 - 5.1 mmol/L Final  . Chloride 09/05/2015 105  101 - 111 mmol/L Final  . CO2 09/05/2015 26  22 - 32 mmol/L Final  . Glucose, Bld 09/05/2015 154* 65 - 99 mg/dL Final  . BUN 14/78/2956 15  6 - 20 mg/dL Final  . Creatinine, Ser 09/05/2015 0.75  0.44 - 1.00 mg/dL Final  . Calcium 21/30/8657 9.3  8.9 - 10.3 mg/dL Final  . Total Protein 09/05/2015 6.6  6.5 - 8.1 g/dL Final  . Albumin 84/69/6295 4.0  3.5 - 5.0 g/dL Final  . AST 28/41/3244 22  15 - 41 U/L Final  . ALT 09/05/2015 15  14 - 54 U/L Final  . Alkaline Phosphatase 09/05/2015 62  38 - 126 U/L  Final  . Total Bilirubin 09/05/2015 0.5  0.3 - 1.2 mg/dL Final  . GFR calc non Af Amer 09/05/2015 >60  >60 mL/min Final  . GFR calc Af Amer 09/05/2015 >60  >60 mL/min Final   Comment: (NOTE) The eGFR has been calculated using the CKD EPI equation. This calculation has not been validated in all clinical situations. eGFR's persistently <60 mL/min signify possible Chronic Kidney Disease.   . Anion gap 09/05/2015 9  5 - 15 Final  . Prothrombin Time 09/05/2015 22.8* 11.6 - 15.2 seconds Final  . INR 09/05/2015 2.03* 0.00 - 1.49 Final  . Color, Urine 09/05/2015 YELLOW  YELLOW Final  . APPearance 09/05/2015 CLEAR  CLEAR Final  . Specific Gravity, Urine 09/05/2015 1.016  1.005 - 1.030 Final  . pH 09/05/2015 5.0  5.0 - 8.0 Final  . Glucose, UA 09/05/2015 NEGATIVE  NEGATIVE mg/dL Final  . Hgb urine dipstick 09/05/2015 NEGATIVE  NEGATIVE Final  . Bilirubin Urine 09/05/2015 NEGATIVE  NEGATIVE Final  . Ketones, ur 09/05/2015 NEGATIVE  NEGATIVE mg/dL Final  . Protein, ur 01/01/7252 NEGATIVE  NEGATIVE mg/dL Final  . Urobilinogen, UA 09/05/2015 0.2  0.0 - 1.0 mg/dL Final  . Nitrite 66/44/0347 NEGATIVE  NEGATIVE Final  . Leukocytes, UA 09/05/2015 NEGATIVE  NEGATIVE Final   MICROSCOPIC NOT DONE ON URINES  WITH NEGATIVE PROTEIN, BLOOD, LEUKOCYTES, NITRITE, OR GLUCOSE <1000 mg/dL.  . ABO/RH(D) 09/05/2015 A POS   Final  . Antibody Screen 09/05/2015 NEG   Final  . Sample Expiration 09/05/2015 09/14/2015   Final  . ABO/RH(D) 09/05/2015 A POS   Final  Appointment on 07/25/2015  Component Date Value Ref Range Status  . Rest HR 07/25/2015 73   Final  . Rest BP 07/25/2015 130/56   Final  . Peak HR 07/25/2015 90   Final  . Peak BP 07/25/2015 141/42   Final  . LV Systolic Volume 07/25/2015 17   Final  . TID 07/25/2015 0.90   Final  . LV Diastolic Volume 07/25/2015 60   Final  . LHR 07/25/2015 0.24   Final  . SSS 07/25/2015 4   Final  . SRS 07/25/2015 0   Final  . SDS 07/25/2015 4   Final     X-Rays:No  results found.  EKG: Orders placed or performed in visit on 06/22/15  . EKG 12-Lead     Hospital Course: [name] is a 81 y.o. who was admitted to [facility]. They were brought to the operating room on 09/11/2015 and underwent Procedure(s): LEFT TOTAL KNEE ARTHROPLASTY.  Patient tolerated the procedure well and was later transferred to the recovery room and then to the orthopaedic floor for postoperative care.  They were given PO and IV analgesics for pain control following their surgery.  They were given 24 hours of postoperative antibiotics of  Anti-infectives    Start     Dose/Rate Route Frequency Ordered Stop   09/11/15 2100  vancomycin (VANCOCIN) IVPB 1000 mg/200 mL premix     1,000 mg 200 mL/hr over 60 Minutes Intravenous Every 12 hours 09/11/15 1252 09/11/15 2226   09/11/15 0700  vancomycin (VANCOCIN) IVPB 1000 mg/200 mL premix     1,000 mg 200 mL/hr over 60 Minutes Intravenous On call to O.R. 09/11/15 6213 09/11/15 0845     and started on DVT prophylaxis in the form of Xarelto.   PT and OT were ordered for total joint protocol.  Discharge planning consulted to help with postop disposition and equipment needs.  Patient had a tough night on the evening of surgery with pain.  They started to get up OOB with therapy on day one. Hemovac drain was pulled without difficulty.  Continued to work with therapy into day two despite continued pain.  Dressing was changed on day two and the incision was healing well.  By day three, the patient had progressed with therapy and meeting their goals.  Incision was healing well.  Patient was seen in rounds on Day Three by Dr. [name] and was ready to go home.  Discharge home with home health Diet - Cardiac diet and Diabetic diet Follow up - in 2 weeks with Dr. [name] Activity - WBAT to the left leg Disposition - Home Condition Upon Discharge - improving D/C Meds - See DC Summary DVT Prophylaxis - Xarelto  Discharge Instructions     Call MD / Call 911    Complete by:  As directed   If you experience chest pain or shortness of breath, CALL 911 and be transported to the hospital emergency room.  If you develope a fever above 101 F, pus (white drainage) or increased drainage or redness at the wound, or calf pain, call your surgeon's office.     Change dressing    Complete by:  As directed   Change dressing daily  with sterile 4 x 4 inch gauze dressing and apply TED hose. Do not submerge the incision under water.     Constipation Prevention    Complete by:  As directed   Drink plenty of fluids.  Prune juice may be helpful.  You may use a stool softener, such as Colace (over the counter) 100 mg twice a day.  Use MiraLax (over the counter) for constipation as needed.     Diet - low sodium heart healthy    Complete by:  As directed      Diet Carb Modified    Complete by:  As directed      Discharge instructions    Complete by:  As directed   Pick up stool softner and laxative for home use following surgery while on pain medications. Do not submerge incision under water. Please use good hand washing techniques while changing dressing each day. May shower starting three days after surgery starting 09/13/2014 Thursday. Please use a clean towel to pat the incision dry following showers. Continue to use ice for pain and swelling after surgery. Do not use any lotions or creams on the incision until instructed by your surgeon.  Resume Pradaxa at home.  Postoperative Constipation Protocol  Constipation - defined medically as fewer than three stools per week and severe constipation as less than one stool per week.  One of the most common issues patients have following surgery is constipation.  Even if you have a regular bowel pattern at home, your normal regimen is likely to be disrupted due to multiple reasons following surgery.  Combination of anesthesia, postoperative narcotics, change in appetite and fluid intake all can affect  your bowels.  In order to avoid complications following surgery, here are some recommendations in order to help you during your recovery period.  Colace (docusate) - Pick up an over-the-counter form of Colace or another stool softener and take twice a day as long as you are requiring postoperative pain medications.  Take with a full glass of water daily.  If you experience loose stools or diarrhea, hold the colace until you stool forms back up.  If your symptoms do not get better within 1 week or if they get worse, check with your doctor.  Dulcolax (bisacodyl) - Pick up over-the-counter and take as directed by the product packaging as needed to assist with the movement of your bowels.  Take with a full glass of water.  Use this product as needed if not relieved by Colace only.   MiraLax (polyethylene glycol) - Pick up over-the-counter to have on hand.  MiraLax is a solution that will increase the amount of water in your bowels to assist with bowel movements.  Take as directed and can mix with a glass of water, juice, soda, coffee, or tea.  Take if you go more than two days without a movement. Do not use MiraLax more than once per day. Call your doctor if you are still constipated or irregular after using this medication for 7 days in a row.  If you continue to have problems with postoperative constipation, please contact the office for further assistance and recommendations.  If you experience "the worst abdominal pain ever" or develop nausea or vomiting, please contact the office immediatly for further recommendations for treatment.     Do not put a pillow under the knee. Place it under the heel.    Complete by:  As directed      Do not sit on low  chairs, stoools or toilet seats, as it may be difficult to get up from low surfaces    Complete by:  As directed      Driving restrictions    Complete by:  As directed   No driving until released by the physician.     Increase activity slowly as  tolerated    Complete by:  As directed      Lifting restrictions    Complete by:  As directed   No lifting until released by the physician.     Patient may shower    Complete by:  As directed   You may shower without a dressing once there is no drainage.  Do not wash over the wound.  If drainage remains, do not shower until drainage stops.     TED hose    Complete by:  As directed   Use stockings (TED hose) for 3 weeks on both leg(s).  You may remove them at night for sleeping.     Weight bearing as tolerated    Complete by:  As directed   Laterality:  left  Extremity:  Lower            Medication List    STOP taking these medications        ALIGN PO     ENBREL 50 MG/ML injection  Generic drug:  etanercept     insulin glargine 100 UNIT/ML injection  Commonly known as:  LANTUS     MULTIVITAMIN PO     VITAMIN B 12 PO     Vitamin D 1000 UNITS capsule      TAKE these medications        calcium carbonate 750 MG chewable tablet  Commonly known as:  TUMS EX  Chew 2 tablets by mouth every evening.     carvedilol 25 MG tablet  Commonly known as:  COREG  TAKE (1) TABLET TWICE A DAY WITH FOOD.     CLARITIN PO  Take 1 tablet by mouth daily.     clonazePAM 0.5 MG tablet  Commonly known as:  KLONOPIN  Take 0.25 mg by mouth at bedtime as needed (for sleep).     glipiZIDE 5 MG 24 hr tablet  Commonly known as:  GLIPIZIDE XL  Take 2 tablets (10 mg total) by mouth daily with breakfast.     metFORMIN 500 MG tablet  Commonly known as:  GLUCOPHAGE  Take 1,000 mg by mouth 2 (two) times daily.     methocarbamol 500 MG tablet  Commonly known as:  ROBAXIN  Take 1 tablet (500 mg total) by mouth every 6 (six) hours as needed for muscle spasms.     nitroGLYCERIN 0.4 MG SL tablet  Commonly known as:  NITROSTAT  Place 1 tablet (0.4 mg total) under the tongue as needed for chest pain.     nystatin-triamcinolone ointment  Commonly known as:  MYCOLOG  Apply 1 application  topically as directed.     OASIS MOISTURIZING MOUTH SPRAY MT  Use as directed 2-3 sprays in the mouth or throat. During the night when pt wakes up with dry mouth.     PRADAXA 150 MG Caps capsule  Generic drug:  dabigatran  Take 150 mg by mouth 2 (two) times daily.     ramipril 5 MG capsule  Commonly known as:  ALTACE  TAKE (1) CAPSULE TWICE DAILY.     rosuvastatin 10 MG tablet  Commonly known as:  CRESTOR  Take 10 mg  by mouth at bedtime.     traMADol 50 MG tablet  Commonly known as:  ULTRAM  Take 1-2 tablets (50-100 mg total) by mouth every 6 (six) hours as needed (mild to moderate pain).           Follow-up Information    Follow up with [facility].   Why:  rolling walker   Contact information:   [address] [place] [address] [phone]       Follow up with [facility].   Why:  home health physical therapy   Contact information:   [address] [place] [address] [phone]       Follow up with Loanne Drilling, MD. Schedule an appointment as soon as possible for a visit on 09/26/2015.   Specialty:  Orthopedic Surgery   Why:  Call office at [phone] to setup follow up appointment on Tuesday 09/26/2015 with Dr. [name].   Contact information:   [address] [place] [address] [phone]       Signed: Avel Peace, PA-C Orthopaedic Surgery 09/14/2015, 7:42 AM

## 2015-09-14 NOTE — Progress Notes (Signed)
Occupational Therapy Treatment Patient Details Name: [name] MRN: [id number] DOB: [birth year] Today's Date: 09/14/2015    History of present illness Pt is s/p L TKA. Pt with history of CVA, impaired hearing, DM, HTN, and vertigo.    OT comments  Pt limited this session by fatigue and nausea at the end of bathing /dressing task. Returned to supine and nursing in room and aware. Demonstrated tubbench transfer and daughter feels comfortable with how to assist pt with transferring on and off the bench. Pt supposed to d/c today. No further questions by pt and family at this point.    Follow Up Recommendations  No OT follow up;Supervision/Assistance - 24 hour    Equipment Recommendations  None recommended by OT    Recommendations for Other Services      Precautions / Restrictions Precautions Precautions: Fall;Knee Required Braces or Orthoses: Knee Immobilizer - Left Knee Immobilizer - Left: Discontinue once straight leg raise with < 10 degree lag Restrictions Weight Bearing Restrictions: No Other Position/Activity Restrictions: WBAT       Mobility Bed Mobility   Bed Mobility: Sit to Supine       Sit to supine: Min assist   General bed mobility comments: assist for L LE onto the bed.   Transfers Overall transfer level: Needs assistance Equipment used: Rolling walker (2 wheeled)   Sit to Stand: Min assist         General transfer comment: cues for hand placement and LE management.     Balance                                   ADL                               Toileting- Clothing Manipulation and Hygiene: Minimal assistance;Sit to/from stand;Moderate assistance         General ADL Comments: Pt working on bath at [employer] with daughter when OT arrived. Pt stood to wash periareas and then donned underwear over Les for pt as she is not interested in AE. Pt stood to pull up underwear with min/mod assist Pt starting to feel nauseous with  finishing dressing so returned to supine. Pt appeared paler and fatigued. BP taken with nursing present and was 112/50. Demonstrated tub transfer to pt and daughter and daughter verbalizes understanding of how to assist pt and didnt feel pt needed to practice actual transfer. Also, daughter requesting theraband exercises so brought level 1 band to pt room and again demonstrated only the exercises as pt still so fatigued and still nauseous after bath. Explained to perform exercises 2 sets per day of 5-10 reps and progress to 3X per day up to 15 reps as able. Reviewed need for KI when up including to wear it to transfer to tub bench and then doff KI once seated on bench.       Vision                     Perception     Praxis      Cognition   Behavior During Therapy: [facility] for tasks assessed/performed Overall Cognitive Status: Within Functional Limits for tasks assessed                       Extremity/Trunk Assessment  Exercises     Shoulder Instructions       General Comments      Pertinent Vitals/ Pain       Pain Assessment: 0-10 Pain Score: 5  Pain Location: L knee Pain Descriptors / Indicators: Radiating Pain Intervention(s): Repositioned;Monitored during session;Ice applied  Home Living                                          Prior Functioning/Environment              Frequency Min 2X/week     Progress Toward Goals  OT Goals(current goals can now be found in the care plan section)  Progress towards OT goals:  (limited this session by nausea. )     Plan Discharge plan remains appropriate    Co-evaluation                 End of Session Equipment Utilized During Treatment: Rolling walker;Left knee immobilizer CPM Left Knee CPM Left Knee: Off   Activity Tolerance Other (comment) (nausea)   Patient Left in bed;with call bell/phone within reach;with family/visitor present   Nurse Communication           Time: 1610-9604 OT Time Calculation (min): 26 min  Charges: OT General Charges $OT Visit: 1 Procedure OT Treatments $Self Care/Home Management : 8-22 mins $Therapeutic Activity: 8-22 mins  [name]  [phone] 09/14/2015, 10:01 AM

## 2015-09-14 NOTE — Discharge Instructions (Signed)
° °Dr. Frank Aluisio °Total Joint Specialist °[facility] °[address] °[place], [place] [address] °[phone] ° °TOTAL KNEE REPLACEMENT POSTOPERATIVE DIRECTIONS ° °Knee Rehabilitation, Guidelines Following Surgery  °Results after knee surgery are often greatly improved when you follow the exercise, range of motion and muscle strengthening exercises prescribed by your doctor. Safety measures are also important to protect the knee from further injury. Any time any of these exercises cause you to have increased pain or swelling in your knee joint, decrease the amount until you are comfortable again and slowly increase them. If you have problems or questions, call your caregiver or physical therapist for advice.  ° °HOME CARE INSTRUCTIONS  °Remove items at home which could result in a fall. This includes throw rugs or furniture in walking pathways.  °· ICE to the affected knee every three hours for 30 minutes at a time and then as needed for pain and swelling.  Continue to use ice on the knee for pain and swelling from surgery. You may notice swelling that will progress down to the foot and ankle.  This is normal after surgery.  Elevate the leg when you are not up walking on it.   °· Continue to use the breathing machine which will help keep your temperature down.  It is common for your temperature to cycle up and down following surgery, especially at night when you are not up moving around and exerting yourself.  The breathing machine keeps your lungs expanded and your temperature down. °· Do not place pillow under knee, focus on keeping the knee straight while resting ° °DIET °You may resume your previous home diet once your are discharged from the hospital. ° °DRESSING / WOUND CARE / SHOWERING °You may shower 3 days after surgery, but keep the wounds dry during showering.  You may use an occlusive plastic wrap (Press'n Seal for example), NO SOAKING/SUBMERGING IN THE BATHTUB.  If the  bandage gets wet, change with a clean dry gauze.  If the incision gets wet, pat the wound dry with a clean towel. °You may start showering once you are discharged home but do not submerge the incision under water. Just pat the incision dry and apply a dry gauze dressing on daily. °Change the surgical dressing daily and reapply a dry dressing each time. ° °ACTIVITY °Walk with your walker as instructed. °Use walker as long as suggested by your caregivers. °Avoid periods of inactivity such as sitting longer than an hour when not asleep. This helps prevent blood clots.  °You may resume a sexual relationship in one month or when given the OK by your doctor.  °You may return to work once you are cleared by your doctor.  °Do not drive a car for 6 weeks or until released by you surgeon.  °Do not drive while taking narcotics. ° °WEIGHT BEARING °Weight bearing as tolerated with assist device (walker, cane, etc) as directed, use it as long as suggested by your surgeon or therapist, typically at least 4-6 weeks. ° °POSTOPERATIVE CONSTIPATION PROTOCOL °Constipation - defined medically as fewer than three stools per week and severe constipation as less than one stool per week. ° °One of the most common issues patients have following surgery is constipation.  Even if you have a regular bowel pattern at home, your normal regimen is likely to be disrupted due to multiple reasons following surgery.  Combination of anesthesia, postoperative narcotics, change in appetite and fluid intake all can affect your bowels.    In order to avoid complications following surgery, here are some recommendations in order to help you during your recovery period. ° °Colace (docusate) - Pick up an over-the-counter form of Colace or another stool softener and take twice a day as long as you are requiring postoperative pain medications.  Take with a full glass of water daily.  If you experience loose stools or diarrhea, hold the colace until you stool forms  back up.  If your symptoms do not get better within 1 week or if they get worse, check with your doctor. ° °Dulcolax (bisacodyl) - Pick up over-the-counter and take as directed by the product packaging as needed to assist with the movement of your bowels.  Take with a full glass of water.  Use this product as needed if not relieved by Colace only.  ° °MiraLax (polyethylene glycol) - Pick up over-the-counter to have on hand.  MiraLax is a solution that will increase the amount of water in your bowels to assist with bowel movements.  Take as directed and can mix with a glass of water, juice, soda, coffee, or tea.  Take if you go more than two days without a movement. °Do not use MiraLax more than once per day. Call your doctor if you are still constipated or irregular after using this medication for 7 days in a row. ° °If you continue to have problems with postoperative constipation, please contact the office for further assistance and recommendations.  If you experience "the worst abdominal pain ever" or develop nausea or vomiting, please contact the office immediatly for further recommendations for treatment. ° °ITCHING ° If you experience itching with your medications, try taking only a single pain pill, or even half a pain pill at a time.  You can also use Benadryl over the counter for itching or also to help with sleep.  ° °TED HOSE STOCKINGS °Wear the elastic stockings on both legs for three weeks following surgery during the day but you may remove then at night for sleeping. ° °MEDICATIONS °See your medication summary on the “After Visit Summary” that the nursing staff will review with you prior to discharge.  You may have some home medications which will be placed on hold until you complete the course of blood thinner medication.  It is important for you to complete the blood thinner medication as prescribed by your surgeon.  Continue your approved medications as instructed at time of  discharge. ° °PRECAUTIONS °If you experience chest pain or shortness of breath - call 911 immediately for transfer to the hospital emergency department.  °If you develop a fever greater that 101 F, purulent drainage from wound, increased redness or drainage from wound, foul odor from the wound/dressing, or calf pain - CONTACT YOUR SURGEON.   °                                                °FOLLOW-UP APPOINTMENTS °Make sure you keep all of your appointments after your operation with your surgeon and caregivers. You should call the office at the above phone number and make an appointment for approximately two weeks after the date of your surgery or on the date instructed by your surgeon outlined in the "After Visit Summary". ° ° °RANGE OF MOTION AND STRENGTHENING EXERCISES  °Rehabilitation of the knee is important following a knee injury or   an operation. After just a few days of immobilization, the muscles of the thigh which control the knee become weakened and shrink (atrophy). Knee exercises are designed to build up the tone and strength of the thigh muscles and to improve knee motion. Often times heat used for twenty to thirty minutes before working out will loosen up your tissues and help with improving the range of motion but do not use heat for the first two weeks following surgery. These exercises can be done on a training (exercise) mat, on the floor, on a table or on a bed. Use what ever works the best and is most comfortable for you Knee exercises include:  Leg Lifts - While your knee is still immobilized in a splint or cast, you can do straight leg raises. Lift the leg to 60 degrees, hold for 3 sec, and slowly lower the leg. Repeat 10-20 times 2-3 times daily. Perform this exercise against resistance later as your knee gets better.  Quad and Hamstring Sets - Tighten up the muscle on the front of the thigh (Quad) and hold for 5-10 sec. Repeat this 10-20 times hourly. Hamstring sets are done by pushing the  foot backward against an object and holding for 5-10 sec. Repeat as with quad sets.   Leg Slides: Lying on your back, slowly slide your foot toward your buttocks, bending your knee up off the floor (only go as far as is comfortable). Then slowly slide your foot back down until your leg is flat on the floor again.  Angel Wings: Lying on your back spread your legs to the side as far apart as you can without causing discomfort.  A rehabilitation program following serious knee injuries can speed recovery and prevent re-injury in the future due to weakened muscles. Contact your doctor or a physical therapist for more information on knee rehabilitation.   IF YOU ARE TRANSFERRED TO A SKILLED REHAB FACILITY If the patient is transferred to a skilled rehab facility following release from the hospital, a list of the current medications will be sent to the facility for the patient to continue.  When discharged from the skilled rehab facility, please have the facility set up the patient's [place] prior to being released. Also, the skilled facility will be responsible for providing the patient with their medications at time of release from the facility to include their pain medication, the muscle relaxants, and their blood thinner medication. If the patient is still at the rehab facility at time of the two week follow up appointment, the skilled rehab facility will also need to assist the patient in arranging follow up appointment in our office and any transportation needs.  MAKE SURE YOU:  Understand these instructions.  Get help right away if you are not doing well or get worse.    Pick up stool softner and laxative for home use following surgery while on pain medications. Do not submerge incision under water. Please use good hand washing techniques while changing dressing each day. May shower starting three days after surgery. Please use a clean towel to pat the incision dry following  showers. Continue to use ice for pain and swelling after surgery. Do not use any lotions or creams on the incision until instructed by your surgeon.  Resume Pradaxa at time of Discharge.

## 2015-09-14 NOTE — Progress Notes (Signed)
Subjective: 3 Days Post-Op Procedure(s) (LRB): LEFT TOTAL KNEE ARTHROPLASTY (Left) Patient reports pain as mild.   Patient seen in rounds with Dr. Wynelle Link.  Looks better today.  Did not tolerate the strong narcotic pain medication but used the Tramadol through the night. Patient is well, but has had some minor complaints of pain in the knee, requiring pain medications Patient is ready to go home later today following therapy.  Objective: Vital signs in last 24 hours: Temp:  [98.4 F (36.9 C)-99.1 F (37.3 C)] 99.1 F (37.3 C) (09/15 0702) Pulse Rate:  [88-96] 94 (09/15 0702) Resp:  [15-16] 15 (09/15 0702) BP: (127-163)/(60-79) 159/79 mmHg (09/15 0702) SpO2:  [92 %-93 %] 93 % (09/15 0702)  Intake/Output from previous day:  Intake/Output Summary (Last 24 hours) at 09/14/15 0728 Last data filed at 09/14/15 0703  Gross per 24 hour  Intake    720 ml  Output    850 ml  Net   -130 ml    Intake/Output this shift: Total I/O In: 120 [P.O.:120] Out: 400 [Urine:400]  Labs:  Recent Labs  09/12/15 0434 09/13/15 0443 09/14/15 0524  HGB 11.7* 11.0* 10.3*    Recent Labs  09/13/15 0443 09/14/15 0524  WBC 9.6 8.8  RBC 3.66* 3.56*  HCT 32.7* 31.2*  PLT 157 155    Recent Labs  09/12/15 0434 09/13/15 0443  NA 135 138  K 4.3 3.9  CL 104 102  CO2 24 29  BUN 14 15  CREATININE 0.64 0.73  GLUCOSE 250* 241*  CALCIUM 8.8* 8.9    Recent Labs  09/11/15 0735  INR 1.06    EXAM: General - Patient is Alert and Appropriate Extremity - Neurovascular intact Sensation intact distally Dorsiflexion/Plantar flexion intact Incision - clean, dry, no drainage, healing Motor Function - intact, moving foot and toes well on exam.   Assessment/Plan: 3 Days Post-Op Procedure(s) (LRB): LEFT TOTAL KNEE ARTHROPLASTY (Left) Procedure(s) (LRB): LEFT TOTAL KNEE ARTHROPLASTY (Left) Past Medical History  Diagnosis Date  . Long term (current) use of anticoagulants 11/02/2013   Pradaxa   . Atrial flutter 11/02/2013  . Essential hypertension, benign 11/02/2013  . Coronary artery disease   . Vertigo     being monitored, occasional  . High cholesterol     under control  . Heart disease   . Anginal pain     "saw doctor-think it was esophagus spasm"  . Stroke 2012  . Numbness and tingling     lips  . Mouth dryness   . Hearing trouble   . Diabetes mellitus without complication     type 2  . Headache 5465    complicated migraine x2   . H/O jaundice     as child  . Hepatitis     hx of hepatitis A as child  . Arthritis   . Psoriasis   . Psoriatic arthritis   . Osteoporosis     "unsure-maybe in knees"  . Cataract     left eye  . Mumps     as teenager  . Measles     as child   Active Problems:   OA (osteoarthritis) of knee  Estimated body mass index is 33.83 kg/(m^2) as calculated from the following:   Height as of this encounter: 5\' 2"  (1.575 m).   Weight as of this encounter: 83.915 kg (185 lb). Up with therapy Discharge home with home health Diet - Cardiac diet and Diabetic diet Follow up - in 2 weeks with  Dr. Wynelle Link Activity - WBAT to the left leg Disposition - Home Condition Upon Discharge - improving D/C Meds - See DC Summary DVT Prophylaxis - Xarelto  Arlee Muslim, PA-C Orthopaedic Surgery 09/14/2015, 7:28 AM

## 2015-09-14 NOTE — Plan of Care (Signed)
Problem: Phase III Progression Outcomes Goal: Anticoagulant follow-up in place Outcome: Not Applicable Date Met:  06/24/93 Xarelto VTE, no f/u needed.

## 2015-09-15 DIAGNOSIS — I2510 Atherosclerotic heart disease of native coronary artery without angina pectoris: Secondary | ICD-10-CM | POA: Diagnosis not present

## 2015-09-15 DIAGNOSIS — E119 Type 2 diabetes mellitus without complications: Secondary | ICD-10-CM | POA: Diagnosis not present

## 2015-09-15 DIAGNOSIS — L4050 Arthropathic psoriasis, unspecified: Secondary | ICD-10-CM | POA: Diagnosis not present

## 2015-09-15 DIAGNOSIS — I4892 Unspecified atrial flutter: Secondary | ICD-10-CM | POA: Diagnosis not present

## 2015-09-15 DIAGNOSIS — Z471 Aftercare following joint replacement surgery: Secondary | ICD-10-CM | POA: Diagnosis not present

## 2015-09-15 DIAGNOSIS — I10 Essential (primary) hypertension: Secondary | ICD-10-CM | POA: Diagnosis not present

## 2015-09-18 DIAGNOSIS — L4050 Arthropathic psoriasis, unspecified: Secondary | ICD-10-CM | POA: Diagnosis not present

## 2015-09-18 DIAGNOSIS — I4892 Unspecified atrial flutter: Secondary | ICD-10-CM | POA: Diagnosis not present

## 2015-09-18 DIAGNOSIS — I10 Essential (primary) hypertension: Secondary | ICD-10-CM | POA: Diagnosis not present

## 2015-09-18 DIAGNOSIS — E119 Type 2 diabetes mellitus without complications: Secondary | ICD-10-CM | POA: Diagnosis not present

## 2015-09-18 DIAGNOSIS — Z471 Aftercare following joint replacement surgery: Secondary | ICD-10-CM | POA: Diagnosis not present

## 2015-09-18 DIAGNOSIS — I2510 Atherosclerotic heart disease of native coronary artery without angina pectoris: Secondary | ICD-10-CM | POA: Diagnosis not present

## 2015-09-20 DIAGNOSIS — E119 Type 2 diabetes mellitus without complications: Secondary | ICD-10-CM | POA: Diagnosis not present

## 2015-09-20 DIAGNOSIS — I10 Essential (primary) hypertension: Secondary | ICD-10-CM | POA: Diagnosis not present

## 2015-09-20 DIAGNOSIS — I2510 Atherosclerotic heart disease of native coronary artery without angina pectoris: Secondary | ICD-10-CM | POA: Diagnosis not present

## 2015-09-20 DIAGNOSIS — Z471 Aftercare following joint replacement surgery: Secondary | ICD-10-CM | POA: Diagnosis not present

## 2015-09-20 DIAGNOSIS — L4050 Arthropathic psoriasis, unspecified: Secondary | ICD-10-CM | POA: Diagnosis not present

## 2015-09-20 DIAGNOSIS — I4892 Unspecified atrial flutter: Secondary | ICD-10-CM | POA: Diagnosis not present

## 2015-09-21 DIAGNOSIS — I2510 Atherosclerotic heart disease of native coronary artery without angina pectoris: Secondary | ICD-10-CM | POA: Diagnosis not present

## 2015-09-21 DIAGNOSIS — L4050 Arthropathic psoriasis, unspecified: Secondary | ICD-10-CM | POA: Diagnosis not present

## 2015-09-21 DIAGNOSIS — Z471 Aftercare following joint replacement surgery: Secondary | ICD-10-CM | POA: Diagnosis not present

## 2015-09-21 DIAGNOSIS — E119 Type 2 diabetes mellitus without complications: Secondary | ICD-10-CM | POA: Diagnosis not present

## 2015-09-21 DIAGNOSIS — I10 Essential (primary) hypertension: Secondary | ICD-10-CM | POA: Diagnosis not present

## 2015-09-21 DIAGNOSIS — I4892 Unspecified atrial flutter: Secondary | ICD-10-CM | POA: Diagnosis not present

## 2015-09-25 DIAGNOSIS — I2510 Atherosclerotic heart disease of native coronary artery without angina pectoris: Secondary | ICD-10-CM | POA: Diagnosis not present

## 2015-09-25 DIAGNOSIS — Z471 Aftercare following joint replacement surgery: Secondary | ICD-10-CM | POA: Diagnosis not present

## 2015-09-25 DIAGNOSIS — I10 Essential (primary) hypertension: Secondary | ICD-10-CM | POA: Diagnosis not present

## 2015-09-25 DIAGNOSIS — L4050 Arthropathic psoriasis, unspecified: Secondary | ICD-10-CM | POA: Diagnosis not present

## 2015-09-25 DIAGNOSIS — E119 Type 2 diabetes mellitus without complications: Secondary | ICD-10-CM | POA: Diagnosis not present

## 2015-09-25 DIAGNOSIS — I4892 Unspecified atrial flutter: Secondary | ICD-10-CM | POA: Diagnosis not present

## 2015-09-26 DIAGNOSIS — M1712 Unilateral primary osteoarthritis, left knee: Secondary | ICD-10-CM | POA: Diagnosis not present

## 2015-09-26 DIAGNOSIS — Z471 Aftercare following joint replacement surgery: Secondary | ICD-10-CM | POA: Diagnosis not present

## 2015-09-26 DIAGNOSIS — Z96652 Presence of left artificial knee joint: Secondary | ICD-10-CM | POA: Diagnosis not present

## 2015-09-29 DIAGNOSIS — M1712 Unilateral primary osteoarthritis, left knee: Secondary | ICD-10-CM | POA: Diagnosis not present

## 2015-10-02 DIAGNOSIS — M1712 Unilateral primary osteoarthritis, left knee: Secondary | ICD-10-CM | POA: Diagnosis not present

## 2015-10-04 DIAGNOSIS — M1712 Unilateral primary osteoarthritis, left knee: Secondary | ICD-10-CM | POA: Diagnosis not present

## 2015-10-06 DIAGNOSIS — M1712 Unilateral primary osteoarthritis, left knee: Secondary | ICD-10-CM | POA: Diagnosis not present

## 2015-10-09 DIAGNOSIS — M1712 Unilateral primary osteoarthritis, left knee: Secondary | ICD-10-CM | POA: Diagnosis not present

## 2015-10-11 DIAGNOSIS — M1712 Unilateral primary osteoarthritis, left knee: Secondary | ICD-10-CM | POA: Diagnosis not present

## 2015-10-13 DIAGNOSIS — M1712 Unilateral primary osteoarthritis, left knee: Secondary | ICD-10-CM | POA: Diagnosis not present

## 2015-10-16 DIAGNOSIS — M1712 Unilateral primary osteoarthritis, left knee: Secondary | ICD-10-CM | POA: Diagnosis not present

## 2015-10-17 DIAGNOSIS — Z1389 Encounter for screening for other disorder: Secondary | ICD-10-CM | POA: Diagnosis not present

## 2015-10-17 DIAGNOSIS — Z794 Long term (current) use of insulin: Secondary | ICD-10-CM | POA: Diagnosis not present

## 2015-10-17 DIAGNOSIS — Z0000 Encounter for general adult medical examination without abnormal findings: Secondary | ICD-10-CM | POA: Diagnosis not present

## 2015-10-17 DIAGNOSIS — E538 Deficiency of other specified B group vitamins: Secondary | ICD-10-CM | POA: Diagnosis not present

## 2015-10-17 DIAGNOSIS — Z96652 Presence of left artificial knee joint: Secondary | ICD-10-CM | POA: Diagnosis not present

## 2015-10-17 DIAGNOSIS — E11319 Type 2 diabetes mellitus with unspecified diabetic retinopathy without macular edema: Secondary | ICD-10-CM | POA: Diagnosis not present

## 2015-10-17 DIAGNOSIS — K5900 Constipation, unspecified: Secondary | ICD-10-CM | POA: Diagnosis not present

## 2015-10-17 DIAGNOSIS — Z23 Encounter for immunization: Secondary | ICD-10-CM | POA: Diagnosis not present

## 2015-10-17 DIAGNOSIS — I10 Essential (primary) hypertension: Secondary | ICD-10-CM | POA: Diagnosis not present

## 2015-10-17 DIAGNOSIS — Z471 Aftercare following joint replacement surgery: Secondary | ICD-10-CM | POA: Diagnosis not present

## 2015-10-18 DIAGNOSIS — M1712 Unilateral primary osteoarthritis, left knee: Secondary | ICD-10-CM | POA: Diagnosis not present

## 2015-10-20 DIAGNOSIS — M1712 Unilateral primary osteoarthritis, left knee: Secondary | ICD-10-CM | POA: Diagnosis not present

## 2015-10-23 DIAGNOSIS — M1712 Unilateral primary osteoarthritis, left knee: Secondary | ICD-10-CM | POA: Diagnosis not present

## 2015-10-25 DIAGNOSIS — M1712 Unilateral primary osteoarthritis, left knee: Secondary | ICD-10-CM | POA: Diagnosis not present

## 2015-10-27 DIAGNOSIS — M1712 Unilateral primary osteoarthritis, left knee: Secondary | ICD-10-CM | POA: Diagnosis not present

## 2015-10-30 DIAGNOSIS — M1712 Unilateral primary osteoarthritis, left knee: Secondary | ICD-10-CM | POA: Diagnosis not present

## 2015-11-01 DIAGNOSIS — M1712 Unilateral primary osteoarthritis, left knee: Secondary | ICD-10-CM | POA: Diagnosis not present

## 2015-11-06 DIAGNOSIS — M1712 Unilateral primary osteoarthritis, left knee: Secondary | ICD-10-CM | POA: Diagnosis not present

## 2015-11-08 DIAGNOSIS — M1712 Unilateral primary osteoarthritis, left knee: Secondary | ICD-10-CM | POA: Diagnosis not present

## 2015-11-13 ENCOUNTER — Encounter: Payer: Self-pay | Admitting: Interventional Cardiology

## 2015-11-13 ENCOUNTER — Ambulatory Visit (INDEPENDENT_AMBULATORY_CARE_PROVIDER_SITE_OTHER): Payer: Medicare Other | Admitting: Interventional Cardiology

## 2015-11-13 VITALS — BP 122/60 | HR 90 | Ht 62.0 in | Wt 177.0 lb

## 2015-11-13 DIAGNOSIS — I10 Essential (primary) hypertension: Secondary | ICD-10-CM | POA: Diagnosis not present

## 2015-11-13 DIAGNOSIS — I25119 Atherosclerotic heart disease of native coronary artery with unspecified angina pectoris: Secondary | ICD-10-CM

## 2015-11-13 DIAGNOSIS — R079 Chest pain, unspecified: Secondary | ICD-10-CM | POA: Diagnosis not present

## 2015-11-13 DIAGNOSIS — Z7901 Long term (current) use of anticoagulants: Secondary | ICD-10-CM

## 2015-11-13 DIAGNOSIS — I4892 Unspecified atrial flutter: Secondary | ICD-10-CM | POA: Diagnosis not present

## 2015-11-13 NOTE — Patient Instructions (Signed)

## 2015-11-13 NOTE — Progress Notes (Signed)
Cardiology Office Note   Date:  11/13/2015   ID:  [name], DOB 04-11-1934, MRN [id number]  PCP:  Ginette Otto, MD  Cardiologist:  Lesleigh Noe, MD   Chief Complaint  Patient presents with  . Coronary Artery Disease  . Atrial Flutter      History of Present Illness: [name] is a 81 y.o. female who presents for coronary artery disease with prior bypass grafting and recent non-ischemic Cardiolite, essential hypertension, history of paroxysmal atrial flutter, hyperlipidemia, and chronic anticoagulation therapy (Pradaxa).  Following a stress myocardial perfusion study that was low risk, [name] underwent knee replacement surgery without complications. She denies anginal quality chest pain. She denies dyspnea.  Past Medical History  Diagnosis Date  . Long term (current) use of anticoagulants 11/02/2013    Pradaxa   . Atrial flutter 11/02/2013  . Essential hypertension, benign 11/02/2013  . Coronary artery disease   . Vertigo     being monitored, occasional  . High cholesterol     under control  . Heart disease   . Anginal pain     "saw doctor-think it was esophagus spasm"  . Stroke 2012  . Numbness and tingling     lips  . Mouth dryness   . Hearing trouble   . Diabetes mellitus without complication     type 2  . Headache 2015    complicated migraine x2   . H/O jaundice     as child  . Hepatitis     hx of hepatitis A as child  . Arthritis   . Psoriasis   . Psoriatic arthritis   . Osteoporosis     "unsure-maybe in knees"  . Cataract     left eye  . Mumps     as teenager  . Measles     as child    Past Surgical History  Procedure Laterality Date  . Lumbar disc surgery  09/2004    L1-L5  . Coronary artery bypass graft  02/16/2005  . Tonsillectomy  as child    with adenoids  . Appendectomy  as child  . Cardiac catheterization  ~2006    clear  . Total knee arthroplasty Left 09/11/2015    Procedure: LEFT TOTAL KNEE  ARTHROPLASTY;  Surgeon: Ollen Gross, MD;  Location: [facility];  Service: Orthopedics;  Laterality: Left;     Current Outpatient Prescriptions  Medication Sig Dispense Refill  . calcium carbonate (TUMS EX) 750 MG chewable tablet Chew 2 tablets by mouth every evening.     . carvedilol (COREG) 25 MG tablet TAKE (1) TABLET TWICE A DAY WITH FOOD. 60 tablet 11  . clonazePAM (KLONOPIN) 0.5 MG tablet Take 0.25 mg by mouth at bedtime as needed (for sleep).     . Cyanocobalamin (VITAMIN B-12 IJ) Take one (1) injection subcutaneously monthly.    . ENBREL 50 MG/ML injection Take one (1) injection subcutaneously weekly.  5  . glipiZIDE (GLIPIZIDE XL) 5 MG 24 hr tablet Take 2 tablets (10 mg total) by mouth daily with breakfast. 30 tablet 0  . Glycerin (OASIS MOISTURIZING MOUTH SPRAY MT) Use as directed 2-3 sprays in the mouth or throat. During the night when pt wakes up with dry mouth.    Marland Kitchen LANTUS SOLOSTAR 100 UNIT/ML Solostar Pen Inject 44 Units into the skin at bedtime.  4  . Loratadine (CLARITIN PO) Take 1 tablet by mouth daily.    . metFORMIN (GLUCOPHAGE) 500 MG tablet Take 1,000  mg by mouth 2 (two) times daily.    . nitroGLYCERIN (NITROSTAT) 0.4 MG SL tablet Place 1 tablet (0.4 mg total) under the tongue as needed for chest pain. 25 tablet 3  . nystatin-triamcinolone ointment (MYCOLOG) Apply 1 application topically as directed.   0  . PRADAXA 150 MG CAPS Take 150 mg by mouth 2 (two) times daily.     . ramipril (ALTACE) 5 MG capsule TAKE (1) CAPSULE TWICE DAILY. 60 capsule 5  . rosuvastatin (CRESTOR) 10 MG tablet Take 10 mg by mouth at bedtime.      No current facility-administered medications for this visit.    Allergies:   Codeine; Penicillins; and Atorvastatin    Social History:  The patient  reports that she has never smoked. She has never used smokeless tobacco. She reports that she does not drink alcohol or use illicit drugs.   Family History:  The patient's family history is not on file.     ROS:  Please see the history of present illness.   Otherwise, review of systems are positive for none.   All other systems are reviewed and negative.    PHYSICAL EXAM: VS:  There were no vitals taken for this visit. , BMI There is no weight on file to calculate BMI. GEN: Well nourished, well developed, in no acute distress HEENT: normal Neck: no JVD, carotid bruits, or masses Cardiac: RRR.  There is no murmur, rub, or gallop. There is 1+ left lower extremity edema. This is been present since left knee replacement earlier this she. Respiratory:  clear to auscultation bilaterally, normal work of breathing. GI: soft, nontender, nondistended, + BS MS: no deformity or atrophy. Left knee scar from replacement surgery Skin: warm and dry, no rash Neuro:  Strength and sensation are intact Psych: euthymic mood, full affect   EKG:  EKG is not ordered today.    Recent Labs: 09/05/2015: ALT 15 09/13/2015: BUN 15; Creatinine, Ser 0.73; Potassium 3.9; Sodium 138 09/14/2015: Hemoglobin 10.3*; Platelets 155    Lipid Panel    Component Value Date/Time   CHOL 137 08/05/2014 0453   TRIG 105 08/05/2014 0453   HDL 65 08/05/2014 0453   CHOLHDL 2.1 08/05/2014 0453   VLDL 21 08/05/2014 0453   LDLCALC 51 08/05/2014 0453      Wt Readings from Last 3 Encounters:  09/11/15 83.915 kg (185 lb)  09/05/15 83.915 kg (185 lb)  08/21/15 83.008 kg (183 lb)      Other studies Reviewed: Additional studies/ records that were reviewed today include: Review of notes related to knee replacement. The findings include no pertinent clinical data as it relates to the heart.    ASSESSMENT AND PLAN:  1. Chest pain, unspecified chest pain type Proven nonischemic by nuclear study earlier this year  2. Coronary artery disease involving native coronary artery of native heart with angina pectoris (HCC) Stable  3. Atrial flutter, unspecified Perhaps occasional palpitation but no prolonged episodes  4.  Essential hypertension, benign Well controlled  5. Long term current use of anticoagulant therapy No bleeding complications    Current medicines are reviewed at length with the patient today.  The patient has the following concerns regarding medicines: None.  The following changes/actions have been instituted:    No change in current therapy. Encouraged aerobic activity.  Labs/ tests ordered today include:  No orders of the defined types were placed in this encounter.     Disposition:   FU with HS in 1 year  Signed, Lesleigh Noe, MD  11/13/2015 9:28 AM    [facility] [address] [place], [place], [place]  [address] Phone: [phone]; Fax: [phone]

## 2015-11-20 DIAGNOSIS — Z85828 Personal history of other malignant neoplasm of skin: Secondary | ICD-10-CM | POA: Diagnosis not present

## 2015-11-20 DIAGNOSIS — E538 Deficiency of other specified B group vitamins: Secondary | ICD-10-CM | POA: Diagnosis not present

## 2015-11-20 DIAGNOSIS — L570 Actinic keratosis: Secondary | ICD-10-CM | POA: Diagnosis not present

## 2015-11-21 DIAGNOSIS — Z471 Aftercare following joint replacement surgery: Secondary | ICD-10-CM | POA: Diagnosis not present

## 2015-11-21 DIAGNOSIS — Z96652 Presence of left artificial knee joint: Secondary | ICD-10-CM | POA: Diagnosis not present

## 2015-12-06 DIAGNOSIS — L409 Psoriasis, unspecified: Secondary | ICD-10-CM | POA: Diagnosis not present

## 2015-12-06 DIAGNOSIS — M25561 Pain in right knee: Secondary | ICD-10-CM | POA: Diagnosis not present

## 2015-12-06 DIAGNOSIS — L4059 Other psoriatic arthropathy: Secondary | ICD-10-CM | POA: Diagnosis not present

## 2015-12-08 ENCOUNTER — Other Ambulatory Visit: Payer: Self-pay | Admitting: Interventional Cardiology

## 2015-12-21 DIAGNOSIS — E538 Deficiency of other specified B group vitamins: Secondary | ICD-10-CM | POA: Diagnosis not present

## 2015-12-22 ENCOUNTER — Other Ambulatory Visit: Payer: Self-pay

## 2015-12-22 DIAGNOSIS — Z1231 Encounter for screening mammogram for malignant neoplasm of breast: Secondary | ICD-10-CM

## 2016-01-22 DIAGNOSIS — M4120 Other idiopathic scoliosis, site unspecified: Secondary | ICD-10-CM | POA: Diagnosis not present

## 2016-01-22 DIAGNOSIS — M545 Low back pain: Secondary | ICD-10-CM | POA: Diagnosis not present

## 2016-01-22 DIAGNOSIS — E538 Deficiency of other specified B group vitamins: Secondary | ICD-10-CM | POA: Diagnosis not present

## 2016-01-22 DIAGNOSIS — Z6832 Body mass index (BMI) 32.0-32.9, adult: Secondary | ICD-10-CM | POA: Diagnosis not present

## 2016-01-22 DIAGNOSIS — M5416 Radiculopathy, lumbar region: Secondary | ICD-10-CM | POA: Diagnosis not present

## 2016-01-25 ENCOUNTER — Ambulatory Visit
Admission: RE | Admit: 2016-01-25 | Discharge: 2016-01-25 | Disposition: A | Discharge status: Home / Self Care | Payer: Medicare Other | Source: Ambulatory Visit

## 2016-01-25 DIAGNOSIS — Z1231 Encounter for screening mammogram for malignant neoplasm of breast: Secondary | ICD-10-CM

## 2016-01-29 DIAGNOSIS — E119 Type 2 diabetes mellitus without complications: Secondary | ICD-10-CM | POA: Diagnosis not present

## 2016-01-29 DIAGNOSIS — I2510 Atherosclerotic heart disease of native coronary artery without angina pectoris: Secondary | ICD-10-CM | POA: Diagnosis not present

## 2016-01-29 DIAGNOSIS — E11319 Type 2 diabetes mellitus with unspecified diabetic retinopathy without macular edema: Secondary | ICD-10-CM | POA: Diagnosis not present

## 2016-01-29 DIAGNOSIS — Z5181 Encounter for therapeutic drug level monitoring: Secondary | ICD-10-CM | POA: Diagnosis not present

## 2016-01-29 DIAGNOSIS — Z794 Long term (current) use of insulin: Secondary | ICD-10-CM | POA: Diagnosis not present

## 2016-01-29 DIAGNOSIS — E538 Deficiency of other specified B group vitamins: Secondary | ICD-10-CM | POA: Diagnosis not present

## 2016-02-06 DIAGNOSIS — L409 Psoriasis, unspecified: Secondary | ICD-10-CM | POA: Diagnosis not present

## 2016-02-06 DIAGNOSIS — L4059 Other psoriatic arthropathy: Secondary | ICD-10-CM | POA: Diagnosis not present

## 2016-02-06 DIAGNOSIS — Z79899 Other long term (current) drug therapy: Secondary | ICD-10-CM | POA: Diagnosis not present

## 2016-02-06 DIAGNOSIS — M25562 Pain in left knee: Secondary | ICD-10-CM | POA: Diagnosis not present

## 2016-02-06 DIAGNOSIS — M25561 Pain in right knee: Secondary | ICD-10-CM | POA: Diagnosis not present

## 2016-02-12 DIAGNOSIS — Z794 Long term (current) use of insulin: Secondary | ICD-10-CM | POA: Diagnosis not present

## 2016-02-12 DIAGNOSIS — E119 Type 2 diabetes mellitus without complications: Secondary | ICD-10-CM | POA: Diagnosis not present

## 2016-02-20 DIAGNOSIS — Z471 Aftercare following joint replacement surgery: Secondary | ICD-10-CM | POA: Diagnosis not present

## 2016-02-20 DIAGNOSIS — Z96652 Presence of left artificial knee joint: Secondary | ICD-10-CM | POA: Diagnosis not present

## 2016-02-22 ENCOUNTER — Other Ambulatory Visit: Payer: Self-pay | Admitting: Interventional Cardiology

## 2016-02-23 DIAGNOSIS — K5900 Constipation, unspecified: Secondary | ICD-10-CM | POA: Diagnosis not present

## 2016-02-23 DIAGNOSIS — E538 Deficiency of other specified B group vitamins: Secondary | ICD-10-CM | POA: Diagnosis not present

## 2016-02-23 DIAGNOSIS — Z794 Long term (current) use of insulin: Secondary | ICD-10-CM | POA: Diagnosis not present

## 2016-02-23 DIAGNOSIS — E11319 Type 2 diabetes mellitus with unspecified diabetic retinopathy without macular edema: Secondary | ICD-10-CM | POA: Diagnosis not present

## 2016-02-23 DIAGNOSIS — Z79899 Other long term (current) drug therapy: Secondary | ICD-10-CM | POA: Diagnosis not present

## 2016-02-23 DIAGNOSIS — I10 Essential (primary) hypertension: Secondary | ICD-10-CM | POA: Diagnosis not present

## 2016-02-27 DIAGNOSIS — N762 Acute vulvitis: Secondary | ICD-10-CM | POA: Diagnosis not present

## 2016-02-27 DIAGNOSIS — B372 Candidiasis of skin and nail: Secondary | ICD-10-CM | POA: Diagnosis not present

## 2016-02-27 DIAGNOSIS — Z124 Encounter for screening for malignant neoplasm of cervix: Secondary | ICD-10-CM | POA: Diagnosis not present

## 2016-02-28 DIAGNOSIS — Z0100 Encounter for examination of eyes and vision without abnormal findings: Secondary | ICD-10-CM | POA: Diagnosis not present

## 2016-02-28 DIAGNOSIS — H2513 Age-related nuclear cataract, bilateral: Secondary | ICD-10-CM | POA: Diagnosis not present

## 2016-02-28 DIAGNOSIS — H25013 Cortical age-related cataract, bilateral: Secondary | ICD-10-CM | POA: Diagnosis not present

## 2016-03-12 ENCOUNTER — Telehealth: Payer: Self-pay | Admitting: Interventional Cardiology

## 2016-03-12 NOTE — Telephone Encounter (Signed)
New message      Pt is scheduled for cataract surgery on 03-26-16.  Should she stop her pradaxa?

## 2016-03-12 NOTE — Telephone Encounter (Signed)
Returned pt call. Adv pt that Noac's aren't typically held for cataract sx. She should make sure that her eye doctor know that she is on Pradaxa. It would be up to her eye doctor to decide  if Pradaxa needs to be held.  Her eye doctor should contact our office if clearance is needed. Pt verbalized understanding.

## 2016-03-25 DIAGNOSIS — E538 Deficiency of other specified B group vitamins: Secondary | ICD-10-CM | POA: Diagnosis not present

## 2016-03-26 DIAGNOSIS — H25012 Cortical age-related cataract, left eye: Secondary | ICD-10-CM | POA: Diagnosis not present

## 2016-03-26 DIAGNOSIS — H2512 Age-related nuclear cataract, left eye: Secondary | ICD-10-CM | POA: Diagnosis not present

## 2016-03-26 DIAGNOSIS — H25812 Combined forms of age-related cataract, left eye: Secondary | ICD-10-CM | POA: Diagnosis not present

## 2016-04-26 DIAGNOSIS — E538 Deficiency of other specified B group vitamins: Secondary | ICD-10-CM | POA: Diagnosis not present

## 2016-04-26 DIAGNOSIS — Z794 Long term (current) use of insulin: Secondary | ICD-10-CM | POA: Diagnosis not present

## 2016-04-26 DIAGNOSIS — I2510 Atherosclerotic heart disease of native coronary artery without angina pectoris: Secondary | ICD-10-CM | POA: Diagnosis not present

## 2016-04-26 DIAGNOSIS — E11319 Type 2 diabetes mellitus with unspecified diabetic retinopathy without macular edema: Secondary | ICD-10-CM | POA: Diagnosis not present

## 2016-04-30 DIAGNOSIS — L4059 Other psoriatic arthropathy: Secondary | ICD-10-CM | POA: Diagnosis not present

## 2016-04-30 DIAGNOSIS — M25561 Pain in right knee: Secondary | ICD-10-CM | POA: Diagnosis not present

## 2016-04-30 DIAGNOSIS — L409 Psoriasis, unspecified: Secondary | ICD-10-CM | POA: Diagnosis not present

## 2016-05-21 DIAGNOSIS — H25811 Combined forms of age-related cataract, right eye: Secondary | ICD-10-CM | POA: Diagnosis not present

## 2016-05-21 DIAGNOSIS — H2511 Age-related nuclear cataract, right eye: Secondary | ICD-10-CM | POA: Diagnosis not present

## 2016-05-21 DIAGNOSIS — H25011 Cortical age-related cataract, right eye: Secondary | ICD-10-CM | POA: Diagnosis not present

## 2016-05-28 DIAGNOSIS — E538 Deficiency of other specified B group vitamins: Secondary | ICD-10-CM | POA: Diagnosis not present

## 2016-06-10 DIAGNOSIS — Z85828 Personal history of other malignant neoplasm of skin: Secondary | ICD-10-CM | POA: Diagnosis not present

## 2016-06-10 DIAGNOSIS — B078 Other viral warts: Secondary | ICD-10-CM | POA: Diagnosis not present

## 2016-06-10 DIAGNOSIS — L308 Other specified dermatitis: Secondary | ICD-10-CM | POA: Diagnosis not present

## 2016-06-10 DIAGNOSIS — L570 Actinic keratosis: Secondary | ICD-10-CM | POA: Diagnosis not present

## 2016-07-01 DIAGNOSIS — E538 Deficiency of other specified B group vitamins: Secondary | ICD-10-CM | POA: Diagnosis not present

## 2016-07-26 DIAGNOSIS — E11319 Type 2 diabetes mellitus with unspecified diabetic retinopathy without macular edema: Secondary | ICD-10-CM | POA: Diagnosis not present

## 2016-07-26 DIAGNOSIS — I2510 Atherosclerotic heart disease of native coronary artery without angina pectoris: Secondary | ICD-10-CM | POA: Diagnosis not present

## 2016-07-26 DIAGNOSIS — E1165 Type 2 diabetes mellitus with hyperglycemia: Secondary | ICD-10-CM | POA: Diagnosis not present

## 2016-07-26 DIAGNOSIS — Z794 Long term (current) use of insulin: Secondary | ICD-10-CM | POA: Diagnosis not present

## 2016-07-30 DIAGNOSIS — Z96652 Presence of left artificial knee joint: Secondary | ICD-10-CM | POA: Diagnosis not present

## 2016-07-30 DIAGNOSIS — Z471 Aftercare following joint replacement surgery: Secondary | ICD-10-CM | POA: Diagnosis not present

## 2016-08-02 DIAGNOSIS — E538 Deficiency of other specified B group vitamins: Secondary | ICD-10-CM | POA: Diagnosis not present

## 2016-08-06 DIAGNOSIS — L409 Psoriasis, unspecified: Secondary | ICD-10-CM | POA: Diagnosis not present

## 2016-08-06 DIAGNOSIS — M25572 Pain in left ankle and joints of left foot: Secondary | ICD-10-CM | POA: Diagnosis not present

## 2016-08-06 DIAGNOSIS — M25571 Pain in right ankle and joints of right foot: Secondary | ICD-10-CM | POA: Diagnosis not present

## 2016-08-06 DIAGNOSIS — Z79899 Other long term (current) drug therapy: Secondary | ICD-10-CM | POA: Diagnosis not present

## 2016-08-06 DIAGNOSIS — L4059 Other psoriatic arthropathy: Secondary | ICD-10-CM | POA: Diagnosis not present

## 2016-08-06 DIAGNOSIS — M25561 Pain in right knee: Secondary | ICD-10-CM | POA: Diagnosis not present

## 2016-09-03 DIAGNOSIS — E538 Deficiency of other specified B group vitamins: Secondary | ICD-10-CM | POA: Diagnosis not present

## 2016-09-10 DIAGNOSIS — E11319 Type 2 diabetes mellitus with unspecified diabetic retinopathy without macular edema: Secondary | ICD-10-CM | POA: Diagnosis not present

## 2016-09-10 DIAGNOSIS — Z794 Long term (current) use of insulin: Secondary | ICD-10-CM | POA: Diagnosis not present

## 2016-09-10 DIAGNOSIS — I2510 Atherosclerotic heart disease of native coronary artery without angina pectoris: Secondary | ICD-10-CM | POA: Diagnosis not present

## 2016-09-16 DIAGNOSIS — L409 Psoriasis, unspecified: Secondary | ICD-10-CM | POA: Diagnosis not present

## 2016-09-16 DIAGNOSIS — L4059 Other psoriatic arthropathy: Secondary | ICD-10-CM | POA: Diagnosis not present

## 2016-09-16 DIAGNOSIS — M25561 Pain in right knee: Secondary | ICD-10-CM | POA: Diagnosis not present

## 2016-10-04 DIAGNOSIS — Z23 Encounter for immunization: Secondary | ICD-10-CM | POA: Diagnosis not present

## 2016-10-04 DIAGNOSIS — E538 Deficiency of other specified B group vitamins: Secondary | ICD-10-CM | POA: Diagnosis not present

## 2016-10-22 DIAGNOSIS — Z794 Long term (current) use of insulin: Secondary | ICD-10-CM | POA: Diagnosis not present

## 2016-10-22 DIAGNOSIS — Z0000 Encounter for general adult medical examination without abnormal findings: Secondary | ICD-10-CM | POA: Diagnosis not present

## 2016-10-22 DIAGNOSIS — I10 Essential (primary) hypertension: Secondary | ICD-10-CM | POA: Diagnosis not present

## 2016-10-22 DIAGNOSIS — Z79899 Other long term (current) drug therapy: Secondary | ICD-10-CM | POA: Diagnosis not present

## 2016-10-22 DIAGNOSIS — Z1389 Encounter for screening for other disorder: Secondary | ICD-10-CM | POA: Diagnosis not present

## 2016-10-22 DIAGNOSIS — E11319 Type 2 diabetes mellitus with unspecified diabetic retinopathy without macular edema: Secondary | ICD-10-CM | POA: Diagnosis not present

## 2016-11-05 DIAGNOSIS — E538 Deficiency of other specified B group vitamins: Secondary | ICD-10-CM | POA: Diagnosis not present

## 2016-11-14 ENCOUNTER — Ambulatory Visit (INDEPENDENT_AMBULATORY_CARE_PROVIDER_SITE_OTHER): Payer: Medicare Other | Admitting: Interventional Cardiology

## 2016-11-14 ENCOUNTER — Encounter: Payer: Self-pay | Admitting: Interventional Cardiology

## 2016-11-14 VITALS — BP 140/74 | HR 78 | Ht 62.0 in | Wt 181.8 lb

## 2016-11-14 DIAGNOSIS — I10 Essential (primary) hypertension: Secondary | ICD-10-CM | POA: Diagnosis not present

## 2016-11-14 DIAGNOSIS — I484 Atypical atrial flutter: Secondary | ICD-10-CM | POA: Diagnosis not present

## 2016-11-14 DIAGNOSIS — I25708 Atherosclerosis of coronary artery bypass graft(s), unspecified, with other forms of angina pectoris: Secondary | ICD-10-CM

## 2016-11-14 DIAGNOSIS — Z7901 Long term (current) use of anticoagulants: Secondary | ICD-10-CM

## 2016-11-14 DIAGNOSIS — I209 Angina pectoris, unspecified: Secondary | ICD-10-CM | POA: Diagnosis not present

## 2016-11-14 MED ORDER — NITROGLYCERIN 0.4 MG SL SUBL: 0.4000 mg | SUBLINGUAL_TABLET | SUBLINGUAL | 3 refills | Status: DC | PRN

## 2016-11-14 MED ORDER — CARVEDILOL 25 MG PO TABS: ORAL_TABLET | ORAL | 3 refills | Status: DC

## 2016-11-14 MED ORDER — RAMIPRIL 5 MG PO CAPS: 5.0000 mg | ORAL_CAPSULE | Freq: Two times a day (BID) | ORAL | 3 refills | Status: DC

## 2016-11-14 NOTE — Patient Instructions (Signed)

## 2016-11-14 NOTE — Progress Notes (Signed)
Cardiology Office Note    Date:  11/14/2016   ID:  [name], DOB 1934/03/21, MRN [id number]  PCP:  Ginette Otto, MD  Cardiologist: Lesleigh Noe, MD   Chief Complaint  Patient presents with  . Coronary Artery Disease  . Atrial Fibrillation    History of Present Illness:  [name] is a 82 y.o. female follow-up of chronic coronary artery disease status post coronary bypass surgery, paroxysmal atrial fibrillation, chronic anticoagulation therapy, and hypertension.  [name] is doing well. She has noted that fullness in her chest goes away when she drinks water. She therefore feels is related to reflux. The fulfilling occurs at rest. It occurs usually after eating. It is not exertion related.  No neurological complaints. No blood in her urine or stool.    Past Medical History:  Diagnosis Date  . Anginal pain (HCC)    "saw doctor-think it was esophagus spasm"  . Arthritis   . Atrial flutter (HCC) 11/02/2013  . Cataract    left eye  . Coronary artery disease   . Diabetes mellitus without complication (HCC)    type 2  . Essential hypertension, benign 11/02/2013  . H/O jaundice    as child  . Headache 2015   complicated migraine x2   . Hearing trouble   . Heart disease   . Hepatitis    hx of hepatitis A as child  . High cholesterol    under control  . Long term (current) use of anticoagulants 11/02/2013   Pradaxa   . Measles    as child  . Mouth dryness   . Mumps    as teenager  . Numbness and tingling    lips  . Osteoporosis    "unsure-maybe in knees"  . Psoriasis   . Psoriatic arthritis (HCC)   . Stroke (HCC) 2012  . Vertigo    being monitored, occasional    Past Surgical History:  Procedure Laterality Date  . APPENDECTOMY  as child  . CARDIAC CATHETERIZATION  ~2006   clear  . CORONARY ARTERY BYPASS GRAFT  02/16/2005  . LUMBAR DISC SURGERY  09/2004   L1-L5  . TONSILLECTOMY  as child   with adenoids  . TOTAL KNEE ARTHROPLASTY  Left 09/11/2015   Procedure: LEFT TOTAL KNEE ARTHROPLASTY;  Surgeon: Ollen Gross, MD;  Location: [facility];  Service: Orthopedics;  Laterality: Left;    Current Medications: Outpatient Medications Prior to Visit  Medication Sig Dispense Refill  . calcium carbonate (TUMS EX) 750 MG chewable tablet Chew 2 tablets by mouth every evening.     . carvedilol (COREG) 25 MG tablet TAKE (1) TABLET TWICE A DAY WITH FOOD. 60 tablet 11  . clonazePAM (KLONOPIN) 0.5 MG tablet Take 0.25 mg by mouth at bedtime as needed (for sleep).     . Cyanocobalamin (VITAMIN B-12 IJ) Take one (1) injection subcutaneously monthly.    . ENBREL 50 MG/ML injection Take one (1) injection subcutaneously weekly.  5  . Glycerin (OASIS MOISTURIZING MOUTH SPRAY MT) Use as directed 2-3 sprays in the mouth or throat. During the night when pt wakes up with dry mouth.    Marland Kitchen LANTUS SOLOSTAR 100 UNIT/ML Solostar Pen Inject 44 Units into the skin at bedtime.  4  . Loratadine (CLARITIN PO) Take 1 tablet by mouth daily.    . metFORMIN (GLUCOPHAGE) 500 MG tablet Take 1,000 mg by mouth 2 (two) times daily.    . nitroGLYCERIN (NITROSTAT) 0.4 MG SL  tablet Place 1 tablet (0.4 mg total) under the tongue as needed for chest pain. 25 tablet 3  . nystatin-triamcinolone ointment (MYCOLOG) Apply 1 application topically as directed.   0  . PRADAXA 150 MG CAPS Take 150 mg by mouth 2 (two) times daily.     . ramipril (ALTACE) 5 MG capsule Take 1 capsule (5 mg total) by mouth 2 (two) times daily. 60 capsule 8  . rosuvastatin (CRESTOR) 10 MG tablet Take 10 mg by mouth at bedtime.     Marland Kitchen glipiZIDE (GLIPIZIDE XL) 5 MG 24 hr tablet Take 2 tablets (10 mg total) by mouth daily with breakfast. 30 tablet 0   No facility-administered medications prior to visit.      Allergies:   Codeine; Penicillins; and Atorvastatin   Social History   Social History  . Marital status: Married    Spouse name: N/A  . Number of children: 3  . Years of education: BS   Social  History Main Topics  . Smoking status: Never Smoker  . Smokeless tobacco: Never Used  . Alcohol use No  . Drug use: No  . Sexual activity: Not Asked   Other Topics Concern  . None   Social History Narrative   Patient is married with 3 children.   Patient is left handed.   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   Patient drinks 3 cups daily.     Family History:  The patient's family history is not on file.   ROS:   Please see the history of present illness.    Constipation has been her only complaint.  All other systems reviewed and are negative.   PHYSICAL EXAM:   VS:  BP 140/74   Pulse 78   Ht 5\' 2"  (1.575 m)   Wt 181 lb 12.8 oz (82.5 kg)   BMI 33.25 kg/m    GEN: Well nourished, well developed, in no acute distress  HEENT: normal  Neck: no JVD, carotid bruits, or masses Cardiac: RRR; no murmurs, rubs, or gallops,no edema  Respiratory:  clear to auscultation bilaterally, normal work of breathing GI: soft, nontender, nondistended, + BS MS: no deformity or atrophy  Skin: warm and dry, no rash Neuro:  Alert and Oriented x 3, Strength and sensation are intact Psych: euthymic mood, full affect  Wt Readings from Last 3 Encounters:  11/14/16 181 lb 12.8 oz (82.5 kg)  11/13/15 177 lb (80.3 kg)  09/11/15 185 lb (83.9 kg)      Studies/Labs Reviewed:   EKG:  EKG  Normal sinus rhythm, inferior Q waves, anteroseptal Q waves, no change compared to prior tracings. Nonspecific T-wave flattening is noted.  Recent Labs: No results found for requested labs within last 8760 hours.   Lipid Panel    Component Value Date/Time   CHOL 137 08/05/2014 0453   TRIG 105 08/05/2014 0453   HDL 65 08/05/2014 0453   CHOLHDL 2.1 08/05/2014 0453   VLDL 21 08/05/2014 0453   LDLCALC 51 08/05/2014 0453    Additional studies/ records that were reviewed today include:  No new data    ASSESSMENT:    1. Coronary artery disease of bypass graft of native heart with stable  angina pectoris (HCC)   2. Atypical atrial flutter (HCC)   3. Essential hypertension, benign   4. Long term current use of anticoagulant therapy      PLAN:  In order of problems listed above:  1. Stable without anginal symptoms. No  nitroglycerin use his been required. 2. No symptomatic recurrences of this rhythm or atrial fibrillation. 3. Target blood pressure 140/90 or less. Blood pressure is adequately controlled. 4. No side effects/bleeding on Pradaxa. Continue for stroke prophylaxis in the setting of paroxysmal A. fib.    Medication Adjustments/Labs and Tests Ordered: Current medicines are reviewed at length with the patient today.  Concerns regarding medicines are outlined above.  Medication changes, Labs and Tests ordered today are listed in the Patient Instructions below. There are no Patient Instructions on file for this visit.   Signed, Lesleigh Noe, MD  11/14/2016 2:06 PM    [facility] [address] [place], [place], [place]  [address] Phone: [phone]; Fax: [phone]

## 2016-11-14 NOTE — Addendum Note (Signed)
Addended by: Loren Racer on: 11/14/2016 02:16 PM   Modules accepted: Orders

## 2016-11-22 ENCOUNTER — Encounter: Payer: Self-pay | Admitting: Interventional Cardiology

## 2016-12-06 DIAGNOSIS — E538 Deficiency of other specified B group vitamins: Secondary | ICD-10-CM | POA: Diagnosis not present

## 2016-12-09 DIAGNOSIS — L821 Other seborrheic keratosis: Secondary | ICD-10-CM | POA: Diagnosis not present

## 2016-12-09 DIAGNOSIS — B078 Other viral warts: Secondary | ICD-10-CM | POA: Diagnosis not present

## 2016-12-09 DIAGNOSIS — Z85828 Personal history of other malignant neoplasm of skin: Secondary | ICD-10-CM | POA: Diagnosis not present

## 2016-12-11 DIAGNOSIS — I2510 Atherosclerotic heart disease of native coronary artery without angina pectoris: Secondary | ICD-10-CM | POA: Diagnosis not present

## 2016-12-11 DIAGNOSIS — E11319 Type 2 diabetes mellitus with unspecified diabetic retinopathy without macular edema: Secondary | ICD-10-CM | POA: Diagnosis not present

## 2016-12-11 DIAGNOSIS — Z794 Long term (current) use of insulin: Secondary | ICD-10-CM | POA: Diagnosis not present

## 2016-12-11 DIAGNOSIS — Z5181 Encounter for therapeutic drug level monitoring: Secondary | ICD-10-CM | POA: Diagnosis not present

## 2017-01-07 DIAGNOSIS — E11319 Type 2 diabetes mellitus with unspecified diabetic retinopathy without macular edema: Secondary | ICD-10-CM | POA: Diagnosis not present

## 2017-01-07 DIAGNOSIS — Z79899 Other long term (current) drug therapy: Secondary | ICD-10-CM | POA: Diagnosis not present

## 2017-01-07 DIAGNOSIS — Z5181 Encounter for therapeutic drug level monitoring: Secondary | ICD-10-CM | POA: Diagnosis not present

## 2017-01-07 DIAGNOSIS — E538 Deficiency of other specified B group vitamins: Secondary | ICD-10-CM | POA: Diagnosis not present

## 2017-01-08 DIAGNOSIS — M25561 Pain in right knee: Secondary | ICD-10-CM | POA: Diagnosis not present

## 2017-01-08 DIAGNOSIS — Z6832 Body mass index (BMI) 32.0-32.9, adult: Secondary | ICD-10-CM | POA: Diagnosis not present

## 2017-01-08 DIAGNOSIS — L4059 Other psoriatic arthropathy: Secondary | ICD-10-CM | POA: Diagnosis not present

## 2017-01-08 DIAGNOSIS — L409 Psoriasis, unspecified: Secondary | ICD-10-CM | POA: Diagnosis not present

## 2017-01-08 DIAGNOSIS — E669 Obesity, unspecified: Secondary | ICD-10-CM | POA: Diagnosis not present

## 2017-01-27 ENCOUNTER — Telehealth: Payer: Self-pay | Admitting: Interventional Cardiology

## 2017-01-27 ENCOUNTER — Encounter: Payer: Self-pay | Admitting: Internal Medicine

## 2017-01-27 ENCOUNTER — Ambulatory Visit (INDEPENDENT_AMBULATORY_CARE_PROVIDER_SITE_OTHER): Payer: Medicare Other | Admitting: Internal Medicine

## 2017-01-27 VITALS — BP 130/70 | HR 76 | Ht 62.0 in | Wt 177.8 lb

## 2017-01-27 DIAGNOSIS — I25708 Atherosclerosis of coronary artery bypass graft(s), unspecified, with other forms of angina pectoris: Secondary | ICD-10-CM

## 2017-01-27 DIAGNOSIS — R0600 Dyspnea, unspecified: Secondary | ICD-10-CM | POA: Diagnosis not present

## 2017-01-27 DIAGNOSIS — I480 Paroxysmal atrial fibrillation: Secondary | ICD-10-CM

## 2017-01-27 NOTE — Patient Instructions (Signed)
Your physician recommends that you continue on your current medications as directed. Please refer to the Current Medication list given to you today.  Your physician recommends that you return for lab work in: today (BMET, BNP, CBC)  Your physician has recommended that you wear an event monitor. Event monitors are medical devices that record the heart's electrical activity. Doctors most often [place] these monitors to diagnose arrhythmias. Arrhythmias are problems with the speed or rhythm of the heartbeat. The monitor is a small, portable device. You can wear one while you do your normal daily activities. This is usually used to diagnose what is causing palpitations/syncope (passing out).

## 2017-01-27 NOTE — Telephone Encounter (Signed)
Ms. Oberg is calling to report new symptoms, she states that she is experiencing chest heaviness and rapid heart beats. Please call, thanks.

## 2017-01-27 NOTE — Progress Notes (Signed)
Cardiology Office Note   Date:  01/27/2017   ID:  [name], DOB [birth year], MRN [id number]  PCP:  Ginette Otto, MD  Cardiologist:  Katrinka Blazing  Pt presents for eval of chest pressure    History of Present Illness: [name] is a 83 y.o. female with a history of CAD, (s/p CABG), PAF, HTN  She is usually followed by Mendel Ryder  Seen in Novmeber 2017 The pt called in today complaining of chst tightness while reading  Lasted about 30 min  Heart was beating hard after Yesterday she had  Tightness  Again for about 30 min   For last 7 to 10 days noticed more fluttering   Pt did go to [facility] basketball game on Saturday  Felt good  Walked from door to seat without problem  To bathroom without problem Later in day had BBQ  COmplained of some nausea after  No tightness  She does not take pulse     Current Meds  Medication Sig  . calcium carbonate (TUMS EX) 750 MG chewable tablet Chew 2 tablets by mouth every evening.   . carvedilol (COREG) 25 MG tablet TAKE (1) TABLET TWICE A DAY WITH FOOD.  Marland Kitchen clonazePAM (KLONOPIN) 0.5 MG tablet Take 0.25 mg by mouth at bedtime as needed (for sleep).   . Cyanocobalamin (VITAMIN B-12 IJ) Take one (1) injection subcutaneously monthly.  . ENBREL 50 MG/ML injection Take one (1) injection subcutaneously weekly.  . Glycerin (OASIS MOISTURIZING MOUTH SPRAY MT) Use as directed 2-3 sprays in the mouth or throat. During the night when pt wakes up with dry mouth.  Marland Kitchen JARDIANCE 25 MG TABS tablet Take 25 mg by mouth daily.  Marland Kitchen LANTUS SOLOSTAR 100 UNIT/ML Solostar Pen Inject 44 Units into the skin at bedtime.  . Loratadine (CLARITIN PO) Take 1 tablet by mouth daily.  . metFORMIN (GLUCOPHAGE) 500 MG tablet Take 1,000 mg by mouth 2 (two) times daily.  . nitroGLYCERIN (NITROSTAT) 0.4 MG SL tablet Place 1 tablet (0.4 mg total) under the tongue as needed for chest pain.  Marland Kitchen nystatin-triamcinolone ointment (MYCOLOG) Apply 1 application topically as directed.   Marland Kitchen  PRADAXA 150 MG CAPS Take 150 mg by mouth 2 (two) times daily.   . Probiotic Product (ALIGN PO) Take 1 capsule by mouth daily.  . ramipril (ALTACE) 5 MG capsule Take 1 capsule (5 mg total) by mouth 2 (two) times daily.  . rosuvastatin (CRESTOR) 10 MG tablet Take 10 mg by mouth at bedtime.      Allergies:   Codeine; Penicillins; and Atorvastatin   Past Medical History:  Diagnosis Date  . Anginal pain (HCC)    "saw doctor-think it was esophagus spasm"  . Arthritis   . Atrial flutter (HCC) 11/02/2013  . Cataract    left eye  . Coronary artery disease   . Diabetes mellitus without complication (HCC)    type 2  . Essential hypertension, benign 11/02/2013  . H/O jaundice    as child  . Headache 2015   complicated migraine x2   . Hearing trouble   . Heart disease   . Hepatitis    hx of hepatitis A as child  . High cholesterol    under control  . Long term (current) use of anticoagulants 11/02/2013   Pradaxa   . Measles    as child  . Mouth dryness   . Mumps    as teenager  . Numbness and tingling    lips  .  Osteoporosis    "unsure-maybe in knees"  . Psoriasis   . Psoriatic arthritis (HCC)   . Stroke (HCC) 2012  . Vertigo    being monitored, occasional    Past Surgical History:  Procedure Laterality Date  . APPENDECTOMY  as child  . CARDIAC CATHETERIZATION  ~2006   clear  . CORONARY ARTERY BYPASS GRAFT  02/16/2005  . LUMBAR DISC SURGERY  09/2004   L1-L5  . TONSILLECTOMY  as child   with adenoids  . TOTAL KNEE ARTHROPLASTY Left 09/11/2015   Procedure: LEFT TOTAL KNEE ARTHROPLASTY;  Surgeon: Ollen Gross, MD;  Location: [facility];  Service: Orthopedics;  Laterality: Left;     Social History:  The patient  reports that she has never smoked. She has never used smokeless tobacco. She reports that she does not drink alcohol or use drugs.   Family History:  The patient's family history is not on file.    ROS:  Please see the history of present illness. All other systems  are reviewed and  Negative to the above problem except as noted.    PHYSICAL EXAM: VS:  BP 130/70   Pulse 76   Ht 5\' 2"  (1.575 m)   Wt 177 lb 12.8 oz (80.6 kg)   BMI 32.52 kg/m   GEN: Well nourished, well developed, in no acute distress  HEENT: normal  Neck: no JVD, carotid bruits, or masses Cardiac: RRR; no murmurs, rubs, or gallops,no edema  Respiratory:  clear to auscultation bilaterally, normal work of breathing GI: soft, nontender, nondistended, + BS  No hepatomegaly  MS: no deformity Moving all extremities   Skin: warm and dry, no rash Neuro:  Strength and sensation are intact Psych: euthymic mood, full affect   EKG:  EKG is ordered today.  SR 76 bpm  Anteroseptal MI  Inf MI  Nonspecific ST change No signif change from previous EKG     Lipid Panel    Component Value Date/Time   CHOL 137 08/05/2014 0453   TRIG 105 08/05/2014 0453   HDL 65 08/05/2014 0453   CHOLHDL 2.1 08/05/2014 0453   VLDL 21 08/05/2014 0453   LDLCALC 51 08/05/2014 0453      Wt Readings from Last 3 Encounters:  01/27/17 177 lb 12.8 oz (80.6 kg)  11/14/16 181 lb 12.8 oz (82.5 kg)  11/13/15 177 lb (80.3 kg)      ASSESSMENT AND PLAN:  1  Chest pressure  Episodes concerning but not what goes against signif ischemia is that she had such a good day on Saturday when she was quite active  On exam today, volume status looks OK ? If she is having more Atrial fib than she knows Recom   Check labs today (CBC, BMET, BNP)   Set up for event monitor  2.  CAD  As above    3  PAF  As noted above    4  HTN  BP is OK    No change in meds for now     Current medicines are reviewed at length with the patient today.  The patient does not have concerns regarding medicines.  Signed, Dietrich Pates, MD  01/27/2017 2:03 PM    [facility] [address] [place], [place], [place]  [address] Phone: [phone]; Fax: [phone]

## 2017-01-27 NOTE — Telephone Encounter (Signed)
Pt states she had tightness in her chest sitting at desk reading this morning, lasting about 30 minutes. Pt states she is lying down now and denies chest tightness, heart is beating hard. Pt does not know heart rate or BP. Pt states yesterday she had tightness in her chest, does not recall what she was doing, it lasted about 30 minutes and resolved. Pt states she has always had some infrequent fluttering in her chest lasting seconds. Pt states for the last 7-10 days she has had more frequent fluttering, 2-3 times/day, lasting longer, pt denies lightheadedness or dizziness.

## 2017-01-27 NOTE — Telephone Encounter (Signed)
Pt declined to seek medical care at ED now.   I reviewed with Dr Harrington Challenger:  Per Dr Geni Bers will see pt today at 1:30PM, pt should go to ED if any more symptoms before office visit today.  Pt advised Dr Harrington Challenger will she her today at 1:30PM, pt advised not to drive. Pt advised to take it easy, avoid physical exertion.  Pt advised that if she has anymore symptoms prior to office visit today, including chest tightness or fluttering, she should take NTG and call EMS for transport to [facility] ED, pt advised not to delay medical care.   Pt verbalized understanding

## 2017-01-28 ENCOUNTER — Ambulatory Visit (INDEPENDENT_AMBULATORY_CARE_PROVIDER_SITE_OTHER): Payer: Medicare Other

## 2017-01-28 DIAGNOSIS — I25708 Atherosclerosis of coronary artery bypass graft(s), unspecified, with other forms of angina pectoris: Secondary | ICD-10-CM | POA: Diagnosis not present

## 2017-01-28 DIAGNOSIS — I480 Paroxysmal atrial fibrillation: Secondary | ICD-10-CM

## 2017-01-28 LAB — BASIC METABOLIC PANEL
BUN / CREAT RATIO: 21 (ref 12–28)
BUN: 18 mg/dL (ref 8–27)
CO2: 21 mmol/L (ref 18–29)
CREATININE: 0.87 mg/dL (ref 0.57–1.00)
Calcium: 9.6 mg/dL (ref 8.7–10.3)
Chloride: 103 mmol/L (ref 96–106)
GFR calc Af Amer: 72 mL/min/{1.73_m2} (ref >59)
GFR, EST NON AFRICAN AMERICAN: 63 mL/min/{1.73_m2} (ref >59)
GLUCOSE: 125 mg/dL — AB (ref 65–99)
Potassium: 4.5 mmol/L (ref 3.5–5.2)
Sodium: 142 mmol/L (ref 134–144)

## 2017-01-28 LAB — CBC
HEMATOCRIT: 40.4 % (ref 34.0–46.6)
Hemoglobin: 13 g/dL (ref 11.1–15.9)
MCH: 28.1 pg (ref 26.6–33.0)
MCHC: 32.2 g/dL (ref 31.5–35.7)
MCV: 87 fL (ref 79–97)
Platelets: 176 10*3/uL (ref 150–379)
RBC: 4.62 x10E6/uL (ref 3.77–5.28)
RDW: 13.8 % (ref 12.3–15.4)
WBC: 8.1 10*3/uL (ref 3.4–10.8)

## 2017-01-28 LAB — PRO B NATRIURETIC PEPTIDE: NT-Pro BNP: 110 pg/mL (ref 0–738)

## 2017-02-10 DIAGNOSIS — E538 Deficiency of other specified B group vitamins: Secondary | ICD-10-CM | POA: Diagnosis not present

## 2017-02-11 DIAGNOSIS — Z79899 Other long term (current) drug therapy: Secondary | ICD-10-CM | POA: Diagnosis not present

## 2017-02-11 DIAGNOSIS — E669 Obesity, unspecified: Secondary | ICD-10-CM | POA: Diagnosis not present

## 2017-02-11 DIAGNOSIS — L409 Psoriasis, unspecified: Secondary | ICD-10-CM | POA: Diagnosis not present

## 2017-02-11 DIAGNOSIS — Z6832 Body mass index (BMI) 32.0-32.9, adult: Secondary | ICD-10-CM | POA: Diagnosis not present

## 2017-02-11 DIAGNOSIS — L4059 Other psoriatic arthropathy: Secondary | ICD-10-CM | POA: Diagnosis not present

## 2017-02-18 ENCOUNTER — Telehealth: Payer: Self-pay | Admitting: *Deleted

## 2017-02-18 DIAGNOSIS — L4059 Other psoriatic arthropathy: Secondary | ICD-10-CM | POA: Diagnosis not present

## 2017-02-18 NOTE — Telephone Encounter (Signed)
Scheduled pt to see Bonney Leitz, PA-C on 03/12/17, on a day that Dr. Tamala Julian is in the office. Pt verbalized understanding and was in agreement with this plan.

## 2017-02-18 NOTE — Telephone Encounter (Signed)
There is currently no follow up appointment scheduled.  Will forward to Dr. Thompson Caul nurse.

## 2017-02-18 NOTE — Telephone Encounter (Signed)
-----   Message from Fay Records, MD sent at 02/16/2017  7:38 PM EST ----- Pt currently has event monitor  Please make sure pt has f/u in clinic with Linard Millers

## 2017-03-11 NOTE — Progress Notes (Signed)
Cardiology Office Note    Date:  03/12/2017   ID:  LEAANN RANCK, DOB 06-30-1934, MRN [id number]  PCP:  Ginette Otto, MD  Cardiologist:  Dr. Katrinka Blazing   CC: follow up monitor  History of Present Illness:  [name] is a 83 y.o. female with a history of CAD s/p CABG (2006), PAF on pradaxa, RA, HTN and DMT2 who presents to clinic for follow up.   Last cath in 2007 showed widely patent LIMA--> LAD, SVG--> D1 and SVG to OM. Last nuclear stress test in 06/2015 (pre op for surgery) was low risk.   She was seen by Dr. Tenny Craw on 01/27/17 for work in visit for chest pressure. BNP, CBC and BMET were normal. Dr Tenny Craw wondered if she was having bursts of afib and ordered a heart monitor. This showed no afib but some brief atrial runs that coincided with palpitations.   Today she presents to clinic for follow up. When she was wearing the monitor she did have some heaviness that coincided with the atrial runs. Since the time she worse the monitor she has had some chest cramping while eating breakfast and putting something in the microwave. It lasted seconds and it radiated through her back. No diaphoresis, SOB or nausea. No lightheadedness or dizziness. No LE edema, orthopnea or PND. No dizziness or syncope. No blood in stool or urine. She worries it could be her imagination.     Past Medical History:  Diagnosis Date  . Anginal pain (HCC)    "saw doctor-think it was esophagus spasm"  . Arthritis   . Atrial flutter (HCC) 11/02/2013  . Cataract    left eye  . Coronary artery disease   . Diabetes mellitus without complication (HCC)    type 2  . Essential hypertension, benign 11/02/2013  . H/O jaundice    as child  . Headache 2015   complicated migraine x2   . Hearing trouble   . Heart disease   . Hepatitis    hx of hepatitis A as child  . High cholesterol    under control  . Long term (current) use of anticoagulants 11/02/2013   Pradaxa   . Measles    as child  . Mouth dryness     . Mumps    as teenager  . Numbness and tingling    lips  . Osteoporosis    "unsure-maybe in knees"  . Psoriasis   . Psoriatic arthritis (HCC)   . Stroke (HCC) 2012  . Vertigo    being monitored, occasional    Past Surgical History:  Procedure Laterality Date  . APPENDECTOMY  as child  . CARDIAC CATHETERIZATION  ~2006   clear  . CORONARY ARTERY BYPASS GRAFT  02/16/2005  . LUMBAR DISC SURGERY  09/2004   L1-L5  . TONSILLECTOMY  as child   with adenoids  . TOTAL KNEE ARTHROPLASTY Left 09/11/2015   Procedure: LEFT TOTAL KNEE ARTHROPLASTY;  Surgeon: Ollen Gross, MD;  Location: [facility];  Service: Orthopedics;  Laterality: Left;    Current Medications: Outpatient Medications Prior to Visit  Medication Sig Dispense Refill  . calcium carbonate (TUMS EX) 750 MG chewable tablet Chew 2 tablets by mouth every evening.     . carvedilol (COREG) 25 MG tablet TAKE (1) TABLET TWICE A DAY WITH FOOD. 180 tablet 3  . clonazePAM (KLONOPIN) 0.5 MG tablet Take 0.25 mg by mouth at bedtime as needed (for sleep).     . Cyanocobalamin (VITAMIN B-12  IJ) Take one (1) injection subcutaneously monthly.    . Glycerin (OASIS MOISTURIZING MOUTH SPRAY MT) Use as directed 2-3 sprays in the mouth or throat. During the night when pt wakes up with dry mouth.    Marland Kitchen JARDIANCE 25 MG TABS tablet Take 25 mg by mouth daily.  10  . LANTUS SOLOSTAR 100 UNIT/ML Solostar Pen Inject 44 Units into the skin at bedtime.  4  . Loratadine (CLARITIN PO) Take 1 tablet by mouth daily.    . metFORMIN (GLUCOPHAGE) 500 MG tablet Take 1,000 mg by mouth 2 (two) times daily.    . nitroGLYCERIN (NITROSTAT) 0.4 MG SL tablet Place 1 tablet (0.4 mg total) under the tongue as needed for chest pain. 25 tablet 3  . nystatin-triamcinolone ointment (MYCOLOG) Apply 1 application topically as directed.   0  . PRADAXA 150 MG CAPS Take 150 mg by mouth 2 (two) times daily.     . Probiotic Product (ALIGN PO) Take 1 capsule by mouth daily.    . ramipril  (ALTACE) 5 MG capsule Take 1 capsule (5 mg total) by mouth 2 (two) times daily. 180 capsule 3  . rosuvastatin (CRESTOR) 10 MG tablet Take 10 mg by mouth at bedtime.     . ENBREL 50 MG/ML injection Take one (1) injection subcutaneously weekly.  5   No facility-administered medications prior to visit.      Allergies:   Codeine; Penicillins; and Atorvastatin   Social History   Social History  . Marital status: Married    Spouse name: N/A  . Number of children: 3  . Years of education: BS   Social History Main Topics  . Smoking status: Never Smoker  . Smokeless tobacco: Never Used  . Alcohol use No  . Drug use: No  . Sexual activity: Not Asked   Other Topics Concern  . None   Social History Narrative   Patient is married with 3 children.   Patient is left handed.   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   Patient drinks 3 cups daily.     Family History:  The patient's family history includes Heart failure in her mother; Neuromuscular disorder in her father.    ROS:   Please see the history of present illness.    ROS All other systems reviewed and are negative.   PHYSICAL EXAM:   VS:  BP 126/64   Pulse 76   Ht 5\' 2"  (1.575 m)   Wt 179 lb 6.4 oz (81.4 kg)   SpO2 97%   BMI 32.81 kg/m    GEN: Well nourished, well developed, in no acute distress, obese HEENT: normal  Neck: no JVD, carotid bruits, or masses Cardiac: RRR; no murmurs, rubs, or gallops,no edema  Respiratory:  clear to auscultation bilaterally, normal work of breathing GI: soft, nontender, nondistended, + BS MS: no deformity or atrophy  Skin: warm and dry, no rash Neuro:  Alert and Oriented x 3, Strength and sensation are intact Psych: euthymic mood, full affect    Wt Readings from Last 3 Encounters:  03/12/17 179 lb 6.4 oz (81.4 kg)  01/27/17 177 lb 12.8 oz (80.6 kg)  11/14/16 181 lb 12.8 oz (82.5 kg)      Studies/Labs Reviewed:   EKG:  EKG is NOT ordered today.   Recent  Labs: 01/27/2017: BUN 18; Creatinine, Ser 0.87; NT-Pro BNP 110; Platelets 176; Potassium 4.5; Sodium 142   Lipid Panel    Component Value Date/Time  CHOL 137 08/05/2014 0453   TRIG 105 08/05/2014 0453   HDL 65 08/05/2014 0453   CHOLHDL 2.1 08/05/2014 0453   VLDL 21 08/05/2014 0453   LDLCALC 51 08/05/2014 0453    Additional studies/ records that were reviewed today include:   Cath 2007  CONCLUSIONS:  1.  Widely patent saphenous vein graft to the first diagonal and obtuse      marginal.  2.  Widely patent LIMA to the LAD.  3.  Native vessel coronary disease with 60%-70% distal left main, 95%      stenosis in the proximal LAD, total occlusion of the proximal      circumflex.  There is diffuse disease between the LIMA insertion site      into the mid LAD and the first diagonal.  The first diagonal could at      some point potentially become ischemic if antegrade flow via the native      circulation becomes occluded and disease in the segment between the LIMA      graft and the diagonal origin becomes more severe.  4.  Normal LV function.   PLAN:  No intervention or change in therapy is indicated.  I believe that  different types of discomfort that the patient had are probably not cardiac  in origin.  Certainly, as time goes along in the future, we will monitor for  evidence of anterolateral ischemia which may represent threatened flow in  the second diagonal which is a relatively large vessel.  The remedy for that  would be left main and proximal LAD stenting.   Nuclear stress test 06/2015 Study Highlights   Nuclear stress EF: 72%.  The study is normal.  This is a low risk study.  The left ventricular ejection fraction is normal (55-65%).      Cardiac monitor 02/2017  Normal sinus rhythm  Rare atrial runs up to 150 bpm, brief in duration.  No atrial fibrillation   No atrial fibrillation. "Fluttering " due to brief atrial runs.   ASSESSMENT & PLAN:   Chest  pressure: atypical and could coincide with atrial runs. She is already on Coreg 25mg  BID. Will add Cardizem CD 120mg  daily to see if this helps sx. Will bring back in 1 month to reevaluate.   CAD: nuclear stress test in 06/2015 low risk. Continue statin and BB. No ASA given pradaxa use.   PAF: sounds regular on exam today. Recent monitor shows no afib. Continue Pradaxa for CHADSVASC of at least (HTN, age, DM, vasc dz, F sex) 6.   HTN: BP well controlled today  DMT2: continue current regimen.  Medication Adjustments/Labs and Tests Ordered: Current medicines are reviewed at length with the patient today.  Concerns regarding medicines are outlined above.  Medication changes, Labs and Tests ordered today are listed in the Patient Instructions below. Patient Instructions  Medication Instructions:  Your physician has recommended you make the following change in your medication:  1.  START Cardizem 120 mg taking 1 tablet daily  Labwork: None ordered  Testing/Procedures: None ordered  Follow-Up: Your physician recommends that you schedule a follow-up appointment in: SEE DR. Katrinka Blazing AS PLANNED   Any Other Special Instructions Will Be Listed Below (If Applicable).   If you need a refill on your cardiac medications before your next appointment, please call your pharmacy.      Signed, Cline Crock, PA-C  03/12/2017 11:05 AM    [facility] [address] [place], [place],  [place]  [address] Phone: [phone]; Fax: [phone]

## 2017-03-12 ENCOUNTER — Ambulatory Visit (INDEPENDENT_AMBULATORY_CARE_PROVIDER_SITE_OTHER): Payer: Medicare Other | Admitting: Physician Assistant

## 2017-03-12 ENCOUNTER — Encounter: Payer: Self-pay | Admitting: Physician Assistant

## 2017-03-12 VITALS — BP 126/64 | HR 76 | Ht 62.0 in | Wt 179.4 lb

## 2017-03-12 DIAGNOSIS — I209 Angina pectoris, unspecified: Secondary | ICD-10-CM

## 2017-03-12 DIAGNOSIS — I480 Paroxysmal atrial fibrillation: Secondary | ICD-10-CM | POA: Diagnosis not present

## 2017-03-12 DIAGNOSIS — Z7901 Long term (current) use of anticoagulants: Secondary | ICD-10-CM | POA: Diagnosis not present

## 2017-03-12 DIAGNOSIS — E118 Type 2 diabetes mellitus with unspecified complications: Secondary | ICD-10-CM

## 2017-03-12 DIAGNOSIS — I10 Essential (primary) hypertension: Secondary | ICD-10-CM

## 2017-03-12 DIAGNOSIS — I25119 Atherosclerotic heart disease of native coronary artery with unspecified angina pectoris: Secondary | ICD-10-CM

## 2017-03-12 MED ORDER — DILTIAZEM HCL ER COATED BEADS 120 MG PO CP24: 120.0000 mg | ORAL_CAPSULE | Freq: Every day | ORAL | 3 refills | Status: DC

## 2017-03-12 NOTE — Patient Instructions (Addendum)
Medication Instructions:  Your physician has recommended you make the following change in your medication:  1.  START Cardizem 120 mg taking 1 tablet daily  Labwork: None ordered  Testing/Procedures: None ordered  Follow-Up: Your physician recommends that you schedule a follow-up appointment in: SEE DR. Tamala Julian AS PLANNED   Any Other Special Instructions Will Be Listed Below (If Applicable).   If you need a refill on your cardiac medications before your next appointment, please call your pharmacy.

## 2017-03-12 NOTE — Progress Notes (Signed)
Noted. Patient was seen today. Again plan was activated.

## 2017-03-13 DIAGNOSIS — E538 Deficiency of other specified B group vitamins: Secondary | ICD-10-CM | POA: Diagnosis not present

## 2017-03-18 ENCOUNTER — Telehealth: Payer: Self-pay | Admitting: Physician Assistant

## 2017-03-18 ENCOUNTER — Telehealth: Payer: Self-pay | Admitting: *Deleted

## 2017-03-18 DIAGNOSIS — Z79899 Other long term (current) drug therapy: Secondary | ICD-10-CM | POA: Diagnosis not present

## 2017-03-18 DIAGNOSIS — L4059 Other psoriatic arthropathy: Secondary | ICD-10-CM | POA: Diagnosis not present

## 2017-03-18 NOTE — Telephone Encounter (Signed)
Pt c/o medication issue:  1. Name of Medication: diltiazem  2. How are you currently taking this medication (dosage and times per day)? 120mg  1xday 3. Are you having a reaction (difficulty breathing--STAT)? no  4. What is your medication issue? Dizzy spell

## 2017-03-18 NOTE — Telephone Encounter (Signed)
Returned pts call. Pt c/o of 2 dizzy spells since she had started taking the Diltiazem 120.   Spoke with Nell Range, PA-C, and she would like for pt to check her bp and to call us back with those results, so we can know if the medication is lowering her bp too much that it is ccausig the dizziness..  Pt advised she will call with that this afternoon, she had another appt this morning.

## 2017-03-18 NOTE — Telephone Encounter (Signed)
Pt called back with her bp, after complaining with the dizziness spell since starting the Diltiazem, and she reports it is 124/64.  Per Nell Range, PA-C, pt can d/c the Diltiazem and keep her f/u appt with her 04/22/17.  Pt verbalized understanding.

## 2017-03-18 NOTE — Addendum Note (Signed)
Addended by: Gaetano Net on: 03/18/2017 10:36 AM   Modules accepted: Orders

## 2017-04-04 ENCOUNTER — Encounter: Payer: Self-pay | Admitting: Physician Assistant

## 2017-04-14 DIAGNOSIS — I2510 Atherosclerotic heart disease of native coronary artery without angina pectoris: Secondary | ICD-10-CM | POA: Diagnosis not present

## 2017-04-14 DIAGNOSIS — E538 Deficiency of other specified B group vitamins: Secondary | ICD-10-CM | POA: Diagnosis not present

## 2017-04-14 DIAGNOSIS — E1165 Type 2 diabetes mellitus with hyperglycemia: Secondary | ICD-10-CM | POA: Diagnosis not present

## 2017-04-14 DIAGNOSIS — Z5181 Encounter for therapeutic drug level monitoring: Secondary | ICD-10-CM | POA: Diagnosis not present

## 2017-04-14 DIAGNOSIS — Z794 Long term (current) use of insulin: Secondary | ICD-10-CM | POA: Diagnosis not present

## 2017-04-14 DIAGNOSIS — E11319 Type 2 diabetes mellitus with unspecified diabetic retinopathy without macular edema: Secondary | ICD-10-CM | POA: Diagnosis not present

## 2017-04-21 NOTE — Progress Notes (Signed)
Cardiology Office Note    Date:  04/22/2017   ID:  OMESHA DESHOTEL, DOB Aug 23, 1934, MRN [id number]  PCP:  Ginette Otto, MD  Cardiologist:  Dr. Katrinka Blazing   CC: follow up   History of Present Illness:  OLIE TEAT is a 83 y.o. female with a history of CAD s/p CABG (2006), PAF on pradaxa, RA, HTN and DMT2 who presents to clinic for follow up.   Last cath in 2007 showed widely patent LIMA--> LAD, SVG--> D1 and SVG to OM. Last nuclear stress test in 06/2015 (pre op for surgery) was low risk.   She was seen by Dr. Tenny Craw on 01/27/17 for work in visit for chest pressure that got better with lifting her left breast. BNP, CBC and BMET were normal. Dr Tenny Craw wondered if she was having bursts of afib and ordered a heart monitor. This showed no afib but some brief atrial runs that coincided with palpitations.   I saw her in clinic on 03/12/17 for follow up.  She was still having some chest pressure. I added Cardizem 120mg  daily to get Coreg 25mg  BID to see if this would help. She felt dizzy with the diltiazem and stopped.   Today she presents to clinic for follow up. She has two types of chest pains. One feels like a central chest cramping that gets better with a drink of water. She also gets a left sided chest pressure that feels better if she lifts her left breast. She recently went to the beach and thinks she "overdid it." She walked around a lot more than usual and did fine. No chest pain or SOB. She had some LE edema at the beach but this has resolved. No orthopnea or PND. No dizziness or syncope.     Past Medical History:  Diagnosis Date  . Anginal pain (HCC)    "saw doctor-think it was esophagus spasm"  . Arthritis   . Atrial flutter (HCC) 11/02/2013  . Cataract    left eye  . Coronary artery disease   . Diabetes mellitus without complication (HCC)    type 2  . Essential hypertension, benign 11/02/2013  . H/O jaundice    as child  . Headache 2015   complicated migraine x2   .  Hearing trouble   . Heart disease   . Hepatitis    hx of hepatitis A as child  . High cholesterol    under control  . Long term (current) use of anticoagulants 11/02/2013   Pradaxa   . Measles    as child  . Mouth dryness   . Mumps    as teenager  . Numbness and tingling    lips  . Osteoporosis    "unsure-maybe in knees"  . Psoriasis   . Psoriatic arthritis (HCC)   . Stroke (HCC) 2012  . Vertigo    being monitored, occasional    Past Surgical History:  Procedure Laterality Date  . APPENDECTOMY  as child  . CARDIAC CATHETERIZATION  ~2006   clear  . CORONARY ARTERY BYPASS GRAFT  02/16/2005  . LUMBAR DISC SURGERY  09/2004   L1-L5  . TONSILLECTOMY  as child   with adenoids  . TOTAL KNEE ARTHROPLASTY Left 09/11/2015   Procedure: LEFT TOTAL KNEE ARTHROPLASTY;  Surgeon: Ollen Gross, MD;  Location: [facility];  Service: Orthopedics;  Laterality: Left;    Current Medications: Outpatient Medications Prior to Visit  Medication Sig Dispense Refill  . calcium carbonate (TUMS EX) 750  MG chewable tablet Chew 2 tablets by mouth every evening.     . carvedilol (COREG) 25 MG tablet TAKE (1) TABLET TWICE A DAY WITH FOOD. 180 tablet 3  . clonazePAM (KLONOPIN) 0.5 MG tablet Take 0.25 mg by mouth at bedtime as needed (for sleep).     . Cyanocobalamin (VITAMIN B-12 IJ) Take one (1) injection subcutaneously monthly.    . Glycerin (OASIS MOISTURIZING MOUTH SPRAY MT) Use as directed 2-3 sprays in the mouth or throat. During the night when pt wakes up with dry mouth.    . Golimumab (SIMPONI ARIA IV) Administered at the by Dr.Hawkes every 2 months.    Marland Kitchen JARDIANCE 25 MG TABS tablet Take 25 mg by mouth daily.  10  . LANTUS SOLOSTAR 100 UNIT/ML Solostar Pen Inject 44 Units into the skin at bedtime.  4  . Loratadine (CLARITIN PO) Take 1 tablet by mouth daily.    . metFORMIN (GLUCOPHAGE) 500 MG tablet Take 1,000 mg by mouth 2 (two) times daily.    . nitroGLYCERIN (NITROSTAT) 0.4 MG SL tablet Place 1  tablet (0.4 mg total) under the tongue as needed for chest pain. 25 tablet 3  . nystatin-triamcinolone ointment (MYCOLOG) Apply 1 application topically as directed.   0  . PRADAXA 150 MG CAPS Take 150 mg by mouth 2 (two) times daily.     . Probiotic Product (ALIGN PO) Take 1 capsule by mouth daily.    . ramipril (ALTACE) 5 MG capsule Take 1 capsule (5 mg total) by mouth 2 (two) times daily. 180 capsule 3  . rosuvastatin (CRESTOR) 10 MG tablet Take 10 mg by mouth at bedtime.      No facility-administered medications prior to visit.      Allergies:   Codeine; Penicillins; and Atorvastatin   Social History   Social History  . Marital status: Married    Spouse name: N/A  . Number of children: 3  . Years of education: BS   Social History Main Topics  . Smoking status: Never Smoker  . Smokeless tobacco: Never Used  . Alcohol use No  . Drug use: No  . Sexual activity: Not Asked   Other Topics Concern  . None   Social History Narrative   Patient is married with 3 children.   Patient is left handed.   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   Patient drinks 3 cups daily.     Family History:  The patient's family history includes Heart failure in her mother; Neuromuscular disorder in her father.      ROS:   Please see the history of present illness.    ROS All other systems reviewed and are negative.   PHYSICAL EXAM:   VS:  BP (!) 110/56   Pulse 83   Ht 5\' 2"  (1.575 m)   Wt 179 lb 1.9 oz (81.2 kg)   SpO2 95%   BMI 32.76 kg/m    GEN: Well nourished, well developed, in no acute distress, obese HEENT: normal  Neck: no JVD, carotid bruits, or masses Cardiac: RRR; no murmurs, rubs, or gallops, trace bilateral LE edema  Respiratory:  clear to auscultation bilaterally, normal work of breathing GI: soft, nontender, nondistended, + BS MS: no deformity or atrophy  Skin: warm and dry, no rash Neuro:  Alert and Oriented x 3, Strength and sensation are intact Psych:  euthymic mood, full affect   Wt Readings from Last 3 Encounters:  04/22/17 179 lb 1.9 oz (  81.2 kg)  03/12/17 179 lb 6.4 oz (81.4 kg)  01/27/17 177 lb 12.8 oz (80.6 kg)      Studies/Labs Reviewed:   EKG:  EKG is NOT ordered today.    Recent Labs: 01/27/2017: BUN 18; Creatinine, Ser 0.87; NT-Pro BNP 110; Platelets 176; Potassium 4.5; Sodium 142   Lipid Panel    Component Value Date/Time   CHOL 137 08/05/2014 0453   TRIG 105 08/05/2014 0453   HDL 65 08/05/2014 0453   CHOLHDL 2.1 08/05/2014 0453   VLDL 21 08/05/2014 0453   LDLCALC 51 08/05/2014 0453    Additional studies/ records that were reviewed today include:  Cath 2007 CONCLUSIONS: 1. Widely patent saphenous vein graft to the first diagonal and obtuse marginal. 2. Widely patent LIMA to the LAD. 3. Native vessel coronary disease with 60%-70% distal left main, 95% stenosis in the proximal LAD, total occlusion of the proximal circumflex. There is diffuse disease between the LIMA insertion site into the mid LAD and the first diagonal. The first diagonal could at some point potentially become ischemic if antegrade flow via the native circulation becomes occluded and disease in the segment between the LIMA graft and the diagonal origin becomes more severe. 4. Normal LV function.  PLAN: No intervention or change in therapy is indicated. I believe that different types of discomfort that the patient had are probably not cardiac in origin. Certainly, as time goes along in the future, we will monitor for evidence of anterolateral ischemia which may represent threatened flow in the second diagonal which is a relatively large vessel. The remedy for that would be left main and proximal LAD stenting.   Nuclear stress test 06/2015 Study Highlights   Nuclear stress EF: 72%.  The study is normal.  This is a low risk study.  The left ventricular ejection fraction is  normal (55-65%).     Cardiac monitor 02/2017  Normal sinus rhythm  Rare atrial runs up to 150 bpm, brief in duration.  No atrial fibrillation No atrial fibrillation. "Fluttering " due to brief atrial runs.    ASSESSMENT & PLAN:   Chest pressure: this has gotten better. She has two types of chest pain. Both are atypical for cardiac chest pain. Will continue to monitor   CAD s/p CABG (2006): stable. Continue statin and BB. No ASA given pradaxa use. Nuclear stress test in 06/2015 was low risk   PAF: sounds regular on exam today. No afib on recent monitor. Continue Pradaxa for CHADSVASC of at least 6 (HTN, age, DM, 71 dz, F sex). Continue coreg 25mg  BID  HTN: Bp well controlled today   DMT2: continue current regimen   Atrial tach: continue coreg 25mg  BID. She did not tolerate the addition of diltiazem due to dizziness  Medication Adjustments/Labs and Tests Ordered: Current medicines are reviewed at length with the patient today.  Concerns regarding medicines are outlined above.  Medication changes, Labs and Tests ordered today are listed in the Patient Instructions below. Patient Instructions  Medication Instructions:  Your physician recommends that you continue on your current medications as directed. Please refer to the Current Medication list given to you today.   Labwork: None ordered  Testing/Procedures: None ordered  Follow-Up: Your physician recommends that you schedule a follow-up appointment in: 3 MONTHS WITH DR. Katrinka Blazing    Any Other Special Instructions Will Be Listed Below (If Applicable).    If you need a refill on your cardiac medications before your next appointment, please call your pharmacy.  Signed, Cline Crock, PA-C  04/22/2017 2:06 PM    [facility] [address] [place], [place], [place]  [address] Phone: [phone]; Fax: [phone]

## 2017-04-22 ENCOUNTER — Encounter: Payer: Self-pay | Admitting: Physician Assistant

## 2017-04-22 ENCOUNTER — Ambulatory Visit (INDEPENDENT_AMBULATORY_CARE_PROVIDER_SITE_OTHER): Payer: Medicare Other | Admitting: Physician Assistant

## 2017-04-22 VITALS — BP 110/56 | HR 83 | Ht 62.0 in | Wt 179.1 lb

## 2017-04-22 DIAGNOSIS — I209 Angina pectoris, unspecified: Secondary | ICD-10-CM | POA: Diagnosis not present

## 2017-04-22 DIAGNOSIS — I25119 Atherosclerotic heart disease of native coronary artery with unspecified angina pectoris: Secondary | ICD-10-CM | POA: Diagnosis not present

## 2017-04-22 DIAGNOSIS — I480 Paroxysmal atrial fibrillation: Secondary | ICD-10-CM | POA: Diagnosis not present

## 2017-04-22 DIAGNOSIS — E118 Type 2 diabetes mellitus with unspecified complications: Secondary | ICD-10-CM | POA: Diagnosis not present

## 2017-04-22 DIAGNOSIS — I10 Essential (primary) hypertension: Secondary | ICD-10-CM | POA: Diagnosis not present

## 2017-04-22 DIAGNOSIS — Z7901 Long term (current) use of anticoagulants: Secondary | ICD-10-CM

## 2017-04-22 NOTE — Patient Instructions (Addendum)
Medication Instructions:  Your physician recommends that you continue on your current medications as directed. Please refer to the Current Medication list given to you today.   Labwork: None ordered  Testing/Procedures: None ordered  Follow-Up: Your physician recommends that you schedule a follow-up appointment in: 3 MONTHS WITH DR. SMITH    Any Other Special Instructions Will Be Listed Below (If Applicable).     If you need a refill on your cardiac medications before your next appointment, please call your pharmacy.   

## 2017-04-24 ENCOUNTER — Ambulatory Visit: Payer: Medicare Other | Admitting: Physician Assistant

## 2017-04-25 DIAGNOSIS — L4050 Arthropathic psoriasis, unspecified: Secondary | ICD-10-CM | POA: Diagnosis not present

## 2017-04-25 DIAGNOSIS — F5101 Primary insomnia: Secondary | ICD-10-CM | POA: Diagnosis not present

## 2017-04-25 DIAGNOSIS — E11319 Type 2 diabetes mellitus with unspecified diabetic retinopathy without macular edema: Secondary | ICD-10-CM | POA: Diagnosis not present

## 2017-04-25 DIAGNOSIS — I10 Essential (primary) hypertension: Secondary | ICD-10-CM | POA: Diagnosis not present

## 2017-04-25 DIAGNOSIS — Z794 Long term (current) use of insulin: Secondary | ICD-10-CM | POA: Diagnosis not present

## 2017-04-25 DIAGNOSIS — E1165 Type 2 diabetes mellitus with hyperglycemia: Secondary | ICD-10-CM | POA: Diagnosis not present

## 2017-05-08 DIAGNOSIS — H04123 Dry eye syndrome of bilateral lacrimal glands: Secondary | ICD-10-CM | POA: Diagnosis not present

## 2017-05-08 DIAGNOSIS — H01004 Unspecified blepharitis left upper eyelid: Secondary | ICD-10-CM | POA: Diagnosis not present

## 2017-05-08 DIAGNOSIS — H01001 Unspecified blepharitis right upper eyelid: Secondary | ICD-10-CM | POA: Diagnosis not present

## 2017-05-12 DIAGNOSIS — M2550 Pain in unspecified joint: Secondary | ICD-10-CM | POA: Diagnosis not present

## 2017-05-12 DIAGNOSIS — L409 Psoriasis, unspecified: Secondary | ICD-10-CM | POA: Diagnosis not present

## 2017-05-12 DIAGNOSIS — E663 Overweight: Secondary | ICD-10-CM | POA: Diagnosis not present

## 2017-05-12 DIAGNOSIS — L4059 Other psoriatic arthropathy: Secondary | ICD-10-CM | POA: Diagnosis not present

## 2017-05-12 DIAGNOSIS — Z6832 Body mass index (BMI) 32.0-32.9, adult: Secondary | ICD-10-CM | POA: Diagnosis not present

## 2017-05-12 DIAGNOSIS — Z79899 Other long term (current) drug therapy: Secondary | ICD-10-CM | POA: Diagnosis not present

## 2017-05-13 DIAGNOSIS — L4059 Other psoriatic arthropathy: Secondary | ICD-10-CM | POA: Diagnosis not present

## 2017-05-16 DIAGNOSIS — E538 Deficiency of other specified B group vitamins: Secondary | ICD-10-CM | POA: Diagnosis not present

## 2017-06-11 DIAGNOSIS — B078 Other viral warts: Secondary | ICD-10-CM | POA: Diagnosis not present

## 2017-06-11 DIAGNOSIS — Z85828 Personal history of other malignant neoplasm of skin: Secondary | ICD-10-CM | POA: Diagnosis not present

## 2017-06-11 DIAGNOSIS — D485 Neoplasm of uncertain behavior of skin: Secondary | ICD-10-CM | POA: Diagnosis not present

## 2017-06-11 DIAGNOSIS — L244 Irritant contact dermatitis due to drugs in contact with skin: Secondary | ICD-10-CM | POA: Diagnosis not present

## 2017-06-17 DIAGNOSIS — E538 Deficiency of other specified B group vitamins: Secondary | ICD-10-CM | POA: Diagnosis not present

## 2017-06-18 DIAGNOSIS — I2510 Atherosclerotic heart disease of native coronary artery without angina pectoris: Secondary | ICD-10-CM | POA: Diagnosis not present

## 2017-06-18 DIAGNOSIS — E11319 Type 2 diabetes mellitus with unspecified diabetic retinopathy without macular edema: Secondary | ICD-10-CM | POA: Diagnosis not present

## 2017-06-18 DIAGNOSIS — I480 Paroxysmal atrial fibrillation: Secondary | ICD-10-CM | POA: Diagnosis not present

## 2017-06-18 DIAGNOSIS — E1165 Type 2 diabetes mellitus with hyperglycemia: Secondary | ICD-10-CM | POA: Diagnosis not present

## 2017-06-18 DIAGNOSIS — Z794 Long term (current) use of insulin: Secondary | ICD-10-CM | POA: Diagnosis not present

## 2017-06-18 DIAGNOSIS — I10 Essential (primary) hypertension: Secondary | ICD-10-CM | POA: Diagnosis not present

## 2017-06-19 ENCOUNTER — Encounter: Payer: Self-pay | Admitting: Interventional Cardiology

## 2017-06-20 DIAGNOSIS — H524 Presbyopia: Secondary | ICD-10-CM | POA: Diagnosis not present

## 2017-06-20 DIAGNOSIS — E113293 Type 2 diabetes mellitus with mild nonproliferative diabetic retinopathy without macular edema, bilateral: Secondary | ICD-10-CM | POA: Diagnosis not present

## 2017-06-20 DIAGNOSIS — H26493 Other secondary cataract, bilateral: Secondary | ICD-10-CM | POA: Diagnosis not present

## 2017-07-06 NOTE — Progress Notes (Signed)
Cardiology Office Note    Date:  07/07/2017   ID:  [name], DOB 1934/12/06, MRN [id number]  PCP:  [name], MD  Cardiologist: Lesleigh Noe, MD   Chief Complaint  Patient presents with  . Chest Pain  . Congestive Heart Failure    History of Present Illness:  [name] is a 83 y.o. female with a history of CAD s/p CABG (2006 with LIMA-->LAD, SVG-->D1 and SVG--> OM; low risk nuclear stress test in 06/2015), PAF on pradaxa, RA, HTN and DMT2 who presents to clinic for follow up.   The patient was last seen in April by Carlean Jews PA-C. At that time she was having atypical chest symptoms. The symptoms were sporadic in occurrence. Since that time, there have completely disappeared and she feels well from the cardiac standpoint. She denies dyspnea, chest pain, palpitations, and syncope. She asked that we extend her next yearly follow-up to 12 months from now.  Past Medical History:  Diagnosis Date  . Anginal pain (HCC)    "saw doctor-think it was esophagus spasm"  . Arthritis   . Atrial flutter (HCC) 11/02/2013  . Cataract    left eye  . Coronary artery disease   . Diabetes mellitus without complication (HCC)    type 2  . Essential hypertension, benign 11/02/2013  . H/O jaundice    as child  . Headache 2015   complicated migraine x2   . Hearing trouble   . Heart disease   . Hepatitis    hx of hepatitis A as child  . High cholesterol    under control  . Long term (current) use of anticoagulants 11/02/2013   Pradaxa   . Measles    as child  . Mouth dryness   . Mumps    as teenager  . Numbness and tingling    lips  . Osteoporosis    "unsure-maybe in knees"  . Psoriasis   . Psoriatic arthritis (HCC)   . Stroke (HCC) 2012  . Vertigo    being monitored, occasional    Past Surgical History:  Procedure Laterality Date  . APPENDECTOMY  as child  . CARDIAC CATHETERIZATION  ~2006   clear  . CORONARY ARTERY BYPASS GRAFT  02/16/2005  . LUMBAR  DISC SURGERY  09/2004   L1-L5  . TONSILLECTOMY  as child   with adenoids  . TOTAL KNEE ARTHROPLASTY Left 09/11/2015   Procedure: LEFT TOTAL KNEE ARTHROPLASTY;  Surgeon: Ollen Gross, MD;  Location: [facility];  Service: Orthopedics;  Laterality: Left;    Current Medications: Outpatient Medications Prior to Visit  Medication Sig Dispense Refill  . carvedilol (COREG) 25 MG tablet TAKE (1) TABLET TWICE A DAY WITH FOOD. 180 tablet 3  . clonazePAM (KLONOPIN) 0.5 MG tablet Take 0.25 mg by mouth at bedtime as needed (for sleep).     . Cyanocobalamin (VITAMIN B-12 IJ) Take one (1) injection subcutaneously monthly.    . Glycerin (OASIS MOISTURIZING MOUTH SPRAY MT) Use as directed 2-3 sprays in the mouth or throat. During the night when pt wakes up with dry mouth.    . Golimumab (SIMPONI ARIA IV) Administered at the by Dr.Hawkes every 2 months.    Marland Kitchen JARDIANCE 25 MG TABS tablet Take 25 mg by mouth daily.  10  . LANTUS SOLOSTAR 100 UNIT/ML Solostar Pen Inject 46-48 Units into the skin at bedtime.   4  . Loratadine (CLARITIN PO) Take 1 tablet by mouth daily.    Marland Kitchen  metFORMIN (GLUCOPHAGE) 500 MG tablet Take 1,000 mg by mouth 2 (two) times daily.    . nitroGLYCERIN (NITROSTAT) 0.4 MG SL tablet Place 1 tablet (0.4 mg total) under the tongue as needed for chest pain. 25 tablet 3  . nystatin-triamcinolone ointment (MYCOLOG) Apply 1 application topically as directed.   0  . PRADAXA 150 MG CAPS Take 150 mg by mouth 2 (two) times daily.     . Probiotic Product (ALIGN PO) Take 1 capsule by mouth daily.    . ramipril (ALTACE) 5 MG capsule Take 1 capsule (5 mg total) by mouth 2 (two) times daily. 180 capsule 3  . rosuvastatin (CRESTOR) 10 MG tablet Take 10 mg by mouth at bedtime.     . calcium carbonate (TUMS EX) 750 MG chewable tablet Chew 2 tablets by mouth every evening.      No facility-administered medications prior to visit.      Allergies:   Codeine; Penicillins; and Atorvastatin   Social History    Social History  . Marital status: Married    Spouse name: N/A  . Number of children: 3  . Years of education: BS   Social History Main Topics  . Smoking status: Never Smoker  . Smokeless tobacco: Never Used  . Alcohol use No  . Drug use: No  . Sexual activity: Not Asked   Other Topics Concern  . None   Social History Narrative   Patient is married with 3 children.   Patient is left handed.   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   Patient drinks 3 cups daily.     Family History:  The patient's family history includes Heart failure in her mother; Neuromuscular disorder in her father.   ROS:   Please see the history of present illness.    She has noted ankle edema, back and joint discomfort, and leg pain. All of which except the edema she feels is related to spinal stenosis and arthritis in her right knee. Accompanied by her daughter-in-law. Decreased hearing is noted. All other systems reviewed and are negative.   PHYSICAL EXAM:   VS:  BP (!) 110/56 (BP Location: Right Arm)   Pulse 75   Ht 5\' 2"  (1.575 m)   Wt 176 lb 6.4 oz (80 kg)   BMI 32.26 kg/m    GEN: Well nourished, well developed, in no acute distress  HEENT: normal  Neck: no JVD, carotid bruits, or masses Cardiac: RRR; no murmurs, rubs, or gallops,no edema  Respiratory:  clear to auscultation bilaterally, normal work of breathing GI: soft, nontender, nondistended, + BS MS: no deformity or atrophy  Skin: warm and dry, no rash Neuro:  Alert and Oriented x 3, Strength and sensation are intact Psych: euthymic mood, full affect  Wt Readings from Last 3 Encounters:  07/07/17 176 lb 6.4 oz (80 kg)  04/22/17 179 lb 1.9 oz (81.2 kg)  03/12/17 179 lb 6.4 oz (81.4 kg)      Studies/Labs Reviewed:   EKG:  EKG  none  Recent Labs: 01/27/2017: BUN 18; Creatinine, Ser 0.87; Hemoglobin 13.0; NT-Pro BNP 110; Platelets 176; Potassium 4.5; Sodium 142   Lipid Panel    Component Value Date/Time   CHOL  137 08/05/2014 0453   TRIG 105 08/05/2014 0453   HDL 65 08/05/2014 0453   CHOLHDL 2.1 08/05/2014 0453   VLDL 21 08/05/2014 0453   LDLCALC 51 08/05/2014 0453    Additional studies/ records that were reviewed today include:  none    ASSESSMENT:    1. Coronary artery disease involving coronary bypass graft of native heart with angina pectoris (HCC)   2. Essential hypertension, benign   3. Atypical atrial flutter (HCC)   4. Transient cerebral ischemia, unspecified type   5. Long term current use of anticoagulant therapy      PLAN:  In order of problems listed above:  1. Atypical complaints present in April have completely resolved. For the time being we will continue with clinical observation. 2. 2 g sodium diet and target blood pressure less than 140/90 mmHg. 3. Clinically in sinus rhythm today. No change in current medical regimen. 4. Not discussed 5. No bleeding on Pradaxa.  Recommendations are to improve/increased aerobic activity. Otherwise no change. Notify us if cardiac complaints. Clinical follow-up in one year.    Medication Adjustments/Labs and Tests Ordered: Current medicines are reviewed at length with the patient today.  Concerns regarding medicines are outlined above.  Medication changes, Labs and Tests ordered today are listed in the Patient Instructions below. Patient Instructions  Medication Instructions:  None  Labwork: None  Testing/Procedures: None  Follow-Up: Your physician wants you to follow-up in: 1 year with Dr. Katrinka Blazing.  You will receive a reminder letter in the mail two months in advance. If you don't receive a letter, please call our office to schedule the follow-up appointment.   Any Other Special Instructions Will Be Listed Below (If Applicable).     If you need a refill on your cardiac medications before your next appointment, please call your pharmacy.      Signed, Lesleigh Noe, MD  07/07/2017 11:51 AM    [facility] [address] [place], [place], [place]  [address] Phone: [phone]; Fax: [phone]

## 2017-07-07 ENCOUNTER — Ambulatory Visit (INDEPENDENT_AMBULATORY_CARE_PROVIDER_SITE_OTHER): Payer: Medicare Other | Admitting: Interventional Cardiology

## 2017-07-07 ENCOUNTER — Encounter: Payer: Self-pay | Admitting: Interventional Cardiology

## 2017-07-07 VITALS — BP 110/56 | HR 75 | Ht 62.0 in | Wt 176.4 lb

## 2017-07-07 DIAGNOSIS — I25119 Atherosclerotic heart disease of native coronary artery with unspecified angina pectoris: Secondary | ICD-10-CM | POA: Diagnosis not present

## 2017-07-07 DIAGNOSIS — I484 Atypical atrial flutter: Secondary | ICD-10-CM

## 2017-07-07 DIAGNOSIS — I10 Essential (primary) hypertension: Secondary | ICD-10-CM | POA: Diagnosis not present

## 2017-07-07 DIAGNOSIS — I25709 Atherosclerosis of coronary artery bypass graft(s), unspecified, with unspecified angina pectoris: Secondary | ICD-10-CM | POA: Diagnosis not present

## 2017-07-07 DIAGNOSIS — G459 Transient cerebral ischemic attack, unspecified: Secondary | ICD-10-CM

## 2017-07-07 DIAGNOSIS — I209 Angina pectoris, unspecified: Secondary | ICD-10-CM | POA: Diagnosis not present

## 2017-07-07 DIAGNOSIS — Z7901 Long term (current) use of anticoagulants: Secondary | ICD-10-CM | POA: Diagnosis not present

## 2017-07-07 NOTE — Patient Instructions (Signed)

## 2017-07-08 DIAGNOSIS — H26492 Other secondary cataract, left eye: Secondary | ICD-10-CM | POA: Diagnosis not present

## 2017-07-15 DIAGNOSIS — L4059 Other psoriatic arthropathy: Secondary | ICD-10-CM | POA: Diagnosis not present

## 2017-07-22 DIAGNOSIS — Z5181 Encounter for therapeutic drug level monitoring: Secondary | ICD-10-CM | POA: Diagnosis not present

## 2017-07-22 DIAGNOSIS — E11319 Type 2 diabetes mellitus with unspecified diabetic retinopathy without macular edema: Secondary | ICD-10-CM | POA: Diagnosis not present

## 2017-07-22 DIAGNOSIS — I2510 Atherosclerotic heart disease of native coronary artery without angina pectoris: Secondary | ICD-10-CM | POA: Diagnosis not present

## 2017-07-22 DIAGNOSIS — B379 Candidiasis, unspecified: Secondary | ICD-10-CM | POA: Diagnosis not present

## 2017-07-22 DIAGNOSIS — Z794 Long term (current) use of insulin: Secondary | ICD-10-CM | POA: Diagnosis not present

## 2017-07-22 DIAGNOSIS — E538 Deficiency of other specified B group vitamins: Secondary | ICD-10-CM | POA: Diagnosis not present

## 2017-08-05 DIAGNOSIS — H26491 Other secondary cataract, right eye: Secondary | ICD-10-CM | POA: Diagnosis not present

## 2017-08-12 DIAGNOSIS — Z6832 Body mass index (BMI) 32.0-32.9, adult: Secondary | ICD-10-CM | POA: Diagnosis not present

## 2017-08-12 DIAGNOSIS — Z79899 Other long term (current) drug therapy: Secondary | ICD-10-CM | POA: Diagnosis not present

## 2017-08-12 DIAGNOSIS — M2550 Pain in unspecified joint: Secondary | ICD-10-CM | POA: Diagnosis not present

## 2017-08-12 DIAGNOSIS — L409 Psoriasis, unspecified: Secondary | ICD-10-CM | POA: Diagnosis not present

## 2017-08-12 DIAGNOSIS — E669 Obesity, unspecified: Secondary | ICD-10-CM | POA: Diagnosis not present

## 2017-08-12 DIAGNOSIS — L4059 Other psoriatic arthropathy: Secondary | ICD-10-CM | POA: Diagnosis not present

## 2017-08-25 DIAGNOSIS — E538 Deficiency of other specified B group vitamins: Secondary | ICD-10-CM | POA: Diagnosis not present

## 2017-08-29 DIAGNOSIS — Z79899 Other long term (current) drug therapy: Secondary | ICD-10-CM | POA: Diagnosis not present

## 2017-08-29 DIAGNOSIS — L4059 Other psoriatic arthropathy: Secondary | ICD-10-CM | POA: Diagnosis not present

## 2017-09-26 DIAGNOSIS — E538 Deficiency of other specified B group vitamins: Secondary | ICD-10-CM | POA: Diagnosis not present

## 2017-09-26 DIAGNOSIS — L4059 Other psoriatic arthropathy: Secondary | ICD-10-CM | POA: Diagnosis not present

## 2017-10-08 DIAGNOSIS — Z85828 Personal history of other malignant neoplasm of skin: Secondary | ICD-10-CM | POA: Diagnosis not present

## 2017-10-08 DIAGNOSIS — L821 Other seborrheic keratosis: Secondary | ICD-10-CM | POA: Diagnosis not present

## 2017-10-08 DIAGNOSIS — L570 Actinic keratosis: Secondary | ICD-10-CM | POA: Diagnosis not present

## 2017-10-14 DIAGNOSIS — L4059 Other psoriatic arthropathy: Secondary | ICD-10-CM | POA: Diagnosis not present

## 2017-10-24 DIAGNOSIS — F5101 Primary insomnia: Secondary | ICD-10-CM | POA: Diagnosis not present

## 2017-10-24 DIAGNOSIS — Z6833 Body mass index (BMI) 33.0-33.9, adult: Secondary | ICD-10-CM | POA: Diagnosis not present

## 2017-10-24 DIAGNOSIS — E669 Obesity, unspecified: Secondary | ICD-10-CM | POA: Diagnosis not present

## 2017-10-24 DIAGNOSIS — E7800 Pure hypercholesterolemia, unspecified: Secondary | ICD-10-CM | POA: Diagnosis not present

## 2017-10-24 DIAGNOSIS — Z79899 Other long term (current) drug therapy: Secondary | ICD-10-CM | POA: Diagnosis not present

## 2017-10-24 DIAGNOSIS — Z23 Encounter for immunization: Secondary | ICD-10-CM | POA: Diagnosis not present

## 2017-10-24 DIAGNOSIS — Z0000 Encounter for general adult medical examination without abnormal findings: Secondary | ICD-10-CM | POA: Diagnosis not present

## 2017-10-24 DIAGNOSIS — I480 Paroxysmal atrial fibrillation: Secondary | ICD-10-CM | POA: Diagnosis not present

## 2017-10-24 DIAGNOSIS — Z1389 Encounter for screening for other disorder: Secondary | ICD-10-CM | POA: Diagnosis not present

## 2017-10-24 DIAGNOSIS — I10 Essential (primary) hypertension: Secondary | ICD-10-CM | POA: Diagnosis not present

## 2017-10-24 DIAGNOSIS — E11319 Type 2 diabetes mellitus with unspecified diabetic retinopathy without macular edema: Secondary | ICD-10-CM | POA: Diagnosis not present

## 2017-10-24 DIAGNOSIS — Z7984 Long term (current) use of oral hypoglycemic drugs: Secondary | ICD-10-CM | POA: Diagnosis not present

## 2017-10-29 DIAGNOSIS — E11319 Type 2 diabetes mellitus with unspecified diabetic retinopathy without macular edema: Secondary | ICD-10-CM | POA: Diagnosis not present

## 2017-10-29 DIAGNOSIS — I2510 Atherosclerotic heart disease of native coronary artery without angina pectoris: Secondary | ICD-10-CM | POA: Diagnosis not present

## 2017-10-29 DIAGNOSIS — B379 Candidiasis, unspecified: Secondary | ICD-10-CM | POA: Diagnosis not present

## 2017-10-29 DIAGNOSIS — Z5181 Encounter for therapeutic drug level monitoring: Secondary | ICD-10-CM | POA: Diagnosis not present

## 2017-10-29 DIAGNOSIS — Z794 Long term (current) use of insulin: Secondary | ICD-10-CM | POA: Diagnosis not present

## 2017-10-29 DIAGNOSIS — E538 Deficiency of other specified B group vitamins: Secondary | ICD-10-CM | POA: Diagnosis not present

## 2017-10-29 DIAGNOSIS — Z79899 Other long term (current) drug therapy: Secondary | ICD-10-CM | POA: Diagnosis not present

## 2017-11-10 ENCOUNTER — Other Ambulatory Visit: Payer: Self-pay | Admitting: *Deleted

## 2017-11-10 ENCOUNTER — Other Ambulatory Visit: Payer: Self-pay | Admitting: Interventional Cardiology

## 2017-11-11 DIAGNOSIS — L4059 Other psoriatic arthropathy: Secondary | ICD-10-CM | POA: Diagnosis not present

## 2017-11-12 DIAGNOSIS — Z79899 Other long term (current) drug therapy: Secondary | ICD-10-CM | POA: Diagnosis not present

## 2017-11-12 DIAGNOSIS — M2550 Pain in unspecified joint: Secondary | ICD-10-CM | POA: Diagnosis not present

## 2017-11-12 DIAGNOSIS — L409 Psoriasis, unspecified: Secondary | ICD-10-CM | POA: Diagnosis not present

## 2017-11-12 DIAGNOSIS — L4059 Other psoriatic arthropathy: Secondary | ICD-10-CM | POA: Diagnosis not present

## 2017-11-12 DIAGNOSIS — E669 Obesity, unspecified: Secondary | ICD-10-CM | POA: Diagnosis not present

## 2017-11-12 DIAGNOSIS — Z6832 Body mass index (BMI) 32.0-32.9, adult: Secondary | ICD-10-CM | POA: Diagnosis not present

## 2017-12-01 DIAGNOSIS — E538 Deficiency of other specified B group vitamins: Secondary | ICD-10-CM | POA: Diagnosis not present

## 2017-12-05 ENCOUNTER — Other Ambulatory Visit: Payer: Self-pay | Admitting: Interventional Cardiology

## 2017-12-16 DIAGNOSIS — L4059 Other psoriatic arthropathy: Secondary | ICD-10-CM | POA: Diagnosis not present

## 2018-01-02 DIAGNOSIS — E538 Deficiency of other specified B group vitamins: Secondary | ICD-10-CM | POA: Diagnosis not present

## 2018-01-13 DIAGNOSIS — L409 Psoriasis, unspecified: Secondary | ICD-10-CM | POA: Diagnosis not present

## 2018-02-02 DIAGNOSIS — M4316 Spondylolisthesis, lumbar region: Secondary | ICD-10-CM | POA: Diagnosis not present

## 2018-02-02 DIAGNOSIS — Z6832 Body mass index (BMI) 32.0-32.9, adult: Secondary | ICD-10-CM | POA: Diagnosis not present

## 2018-02-02 DIAGNOSIS — M4120 Other idiopathic scoliosis, site unspecified: Secondary | ICD-10-CM | POA: Diagnosis not present

## 2018-02-02 DIAGNOSIS — M5416 Radiculopathy, lumbar region: Secondary | ICD-10-CM | POA: Diagnosis not present

## 2018-02-02 DIAGNOSIS — M545 Low back pain: Secondary | ICD-10-CM | POA: Diagnosis not present

## 2018-02-03 DIAGNOSIS — E538 Deficiency of other specified B group vitamins: Secondary | ICD-10-CM | POA: Diagnosis not present

## 2018-02-10 ENCOUNTER — Other Ambulatory Visit: Payer: Self-pay | Admitting: Neurosurgery

## 2018-02-10 DIAGNOSIS — L4059 Other psoriatic arthropathy: Secondary | ICD-10-CM | POA: Diagnosis not present

## 2018-02-10 DIAGNOSIS — M4316 Spondylolisthesis, lumbar region: Secondary | ICD-10-CM

## 2018-02-11 DIAGNOSIS — Z79899 Other long term (current) drug therapy: Secondary | ICD-10-CM | POA: Diagnosis not present

## 2018-02-11 DIAGNOSIS — E669 Obesity, unspecified: Secondary | ICD-10-CM | POA: Diagnosis not present

## 2018-02-11 DIAGNOSIS — M545 Low back pain: Secondary | ICD-10-CM | POA: Diagnosis not present

## 2018-02-11 DIAGNOSIS — E11319 Type 2 diabetes mellitus with unspecified diabetic retinopathy without macular edema: Secondary | ICD-10-CM | POA: Diagnosis not present

## 2018-02-11 DIAGNOSIS — L4059 Other psoriatic arthropathy: Secondary | ICD-10-CM | POA: Diagnosis not present

## 2018-02-11 DIAGNOSIS — E538 Deficiency of other specified B group vitamins: Secondary | ICD-10-CM | POA: Diagnosis not present

## 2018-02-11 DIAGNOSIS — M2550 Pain in unspecified joint: Secondary | ICD-10-CM | POA: Diagnosis not present

## 2018-02-11 DIAGNOSIS — L409 Psoriasis, unspecified: Secondary | ICD-10-CM | POA: Diagnosis not present

## 2018-02-11 DIAGNOSIS — Z6832 Body mass index (BMI) 32.0-32.9, adult: Secondary | ICD-10-CM | POA: Diagnosis not present

## 2018-02-11 DIAGNOSIS — I2510 Atherosclerotic heart disease of native coronary artery without angina pectoris: Secondary | ICD-10-CM | POA: Diagnosis not present

## 2018-02-11 DIAGNOSIS — M150 Primary generalized (osteo)arthritis: Secondary | ICD-10-CM | POA: Diagnosis not present

## 2018-02-11 DIAGNOSIS — B379 Candidiasis, unspecified: Secondary | ICD-10-CM | POA: Diagnosis not present

## 2018-02-11 DIAGNOSIS — Z794 Long term (current) use of insulin: Secondary | ICD-10-CM | POA: Diagnosis not present

## 2018-02-12 ENCOUNTER — Other Ambulatory Visit: Payer: Self-pay | Admitting: Interventional Cardiology

## 2018-02-19 ENCOUNTER — Ambulatory Visit
Admission: RE | Admit: 2018-02-19 | Discharge: 2018-02-19 | Disposition: A | Discharge status: Home / Self Care | Payer: Medicare Other | Source: Ambulatory Visit | Attending: Neurosurgery | Admitting: Neurosurgery

## 2018-02-19 DIAGNOSIS — M4316 Spondylolisthesis, lumbar region: Secondary | ICD-10-CM

## 2018-02-19 DIAGNOSIS — M48061 Spinal stenosis, lumbar region without neurogenic claudication: Secondary | ICD-10-CM | POA: Diagnosis not present

## 2018-02-19 MED ORDER — GADOBENATE DIMEGLUMINE 529 MG/ML IV SOLN
15.0000 mL | Freq: Once | INTRAVENOUS | Status: AC | PRN
  Administered 2018-02-19: 15 mL via INTRAVENOUS

## 2018-02-25 DIAGNOSIS — I10 Essential (primary) hypertension: Secondary | ICD-10-CM | POA: Diagnosis not present

## 2018-02-25 DIAGNOSIS — M545 Low back pain: Secondary | ICD-10-CM | POA: Diagnosis not present

## 2018-02-25 DIAGNOSIS — M4316 Spondylolisthesis, lumbar region: Secondary | ICD-10-CM | POA: Diagnosis not present

## 2018-02-25 DIAGNOSIS — Z6832 Body mass index (BMI) 32.0-32.9, adult: Secondary | ICD-10-CM | POA: Diagnosis not present

## 2018-02-25 DIAGNOSIS — M5416 Radiculopathy, lumbar region: Secondary | ICD-10-CM | POA: Diagnosis not present

## 2018-02-25 DIAGNOSIS — M4120 Other idiopathic scoliosis, site unspecified: Secondary | ICD-10-CM | POA: Diagnosis not present

## 2018-02-27 ENCOUNTER — Telehealth: Payer: Self-pay | Admitting: Interventional Cardiology

## 2018-02-27 NOTE — Telephone Encounter (Signed)
New message     Patient called about getting spinal injection, informed patient she needs Dr Maryjean Ka office call us so that we can get the information.  She need clearance to stop her medication. She will call Dr Maryjean Ka office and have them call us back

## 2018-03-02 ENCOUNTER — Telehealth: Payer: Self-pay | Admitting: *Deleted

## 2018-03-02 NOTE — Telephone Encounter (Signed)
   [facility] Pre-operative Risk Assessment    Request for surgical clearance:  1. What type of surgery is being performed? Epidural steroid injection  2. When is this surgery scheduled? 03/26/18  3. What type of clearance is required (medical clearance vs. Pharmacy clearance to hold med vs. Both)? Pharmacy  4. Are there any medications that need to be held prior to surgery and how long? Pradaxa, 5 day prior.  5. Practice name and name of physician performing surgery? [facility], Dr. Clydell Hakim   6. What is your office phone and fax number?   P: [phone] F: [phone]  7. Anesthesia type (None, local, MAC, general) ?   Not specified   Haitham Dolinsky D 03/02/2018, 5:06 PM  _________________________________________________________________   (provider comments below)

## 2018-03-03 NOTE — Telephone Encounter (Signed)
Okay to hold for 3 days and resume as soon as possible.

## 2018-03-03 NOTE — Telephone Encounter (Signed)
Patient with diagnosis of Aflutter on Pradaxa for anticoagulation with history of TIA.    Procedure: Spinal injection  Date of procedure: 03/26/18  CHADS2-VASc score of  6 (CHF, HTN, AGE, DM2, stroke/tia x 2, CAD, AGE, female)  CrCl 74ml/min  Generally hold DOAC 3 days prior to spinal procedure. With history of TIA will defer to Dr. Tamala Julian for duration of hold. Would recommend resume Pradaxa as soon as safe after procedure.   Dr. Tamala Julian, please advised on Pradaxa hold for spinal procedure.

## 2018-03-03 NOTE — Telephone Encounter (Signed)
Will route to pharmacy for recommendations.  

## 2018-03-04 DIAGNOSIS — E538 Deficiency of other specified B group vitamins: Secondary | ICD-10-CM | POA: Diagnosis not present

## 2018-03-04 NOTE — Telephone Encounter (Signed)
   Primary Cardiologist:Henry Nicholes Stairs III, MD  Chart reviewed as part of pre-operative protocol coverage. Pre-op clearance already addressed by colleagues in earlier phone notes. To summarize recommendations, Dr. Tamala Julian has feels it is okay to hold Pradaxa for 3 days and resume as soon as possible.   Will route this bundled recommendation to requesting provider via Epic fax function. Please call with questions.  Charlie Pitter, PA-C 03/04/2018, 2:03 PM

## 2018-03-10 DIAGNOSIS — L4059 Other psoriatic arthropathy: Secondary | ICD-10-CM | POA: Diagnosis not present

## 2018-03-20 DIAGNOSIS — Z01419 Encounter for gynecological examination (general) (routine) without abnormal findings: Secondary | ICD-10-CM | POA: Diagnosis not present

## 2018-03-20 DIAGNOSIS — Z124 Encounter for screening for malignant neoplasm of cervix: Secondary | ICD-10-CM | POA: Diagnosis not present

## 2018-03-20 DIAGNOSIS — N762 Acute vulvitis: Secondary | ICD-10-CM | POA: Diagnosis not present

## 2018-03-26 DIAGNOSIS — M4120 Other idiopathic scoliosis, site unspecified: Secondary | ICD-10-CM | POA: Diagnosis not present

## 2018-03-26 DIAGNOSIS — M5416 Radiculopathy, lumbar region: Secondary | ICD-10-CM | POA: Diagnosis not present

## 2018-04-06 DIAGNOSIS — E538 Deficiency of other specified B group vitamins: Secondary | ICD-10-CM | POA: Diagnosis not present

## 2018-04-07 DIAGNOSIS — L4059 Other psoriatic arthropathy: Secondary | ICD-10-CM | POA: Diagnosis not present

## 2018-04-08 DIAGNOSIS — Z85828 Personal history of other malignant neoplasm of skin: Secondary | ICD-10-CM | POA: Diagnosis not present

## 2018-04-08 DIAGNOSIS — L578 Other skin changes due to chronic exposure to nonionizing radiation: Secondary | ICD-10-CM | POA: Diagnosis not present

## 2018-04-08 DIAGNOSIS — L720 Epidermal cyst: Secondary | ICD-10-CM | POA: Diagnosis not present

## 2018-04-08 DIAGNOSIS — L400 Psoriasis vulgaris: Secondary | ICD-10-CM | POA: Diagnosis not present

## 2018-04-08 DIAGNOSIS — L821 Other seborrheic keratosis: Secondary | ICD-10-CM | POA: Diagnosis not present

## 2018-04-20 DIAGNOSIS — M5416 Radiculopathy, lumbar region: Secondary | ICD-10-CM | POA: Diagnosis not present

## 2018-04-28 DIAGNOSIS — Z794 Long term (current) use of insulin: Secondary | ICD-10-CM | POA: Diagnosis not present

## 2018-04-28 DIAGNOSIS — E11319 Type 2 diabetes mellitus with unspecified diabetic retinopathy without macular edema: Secondary | ICD-10-CM | POA: Diagnosis not present

## 2018-04-28 DIAGNOSIS — I10 Essential (primary) hypertension: Secondary | ICD-10-CM | POA: Diagnosis not present

## 2018-04-28 DIAGNOSIS — Z7984 Long term (current) use of oral hypoglycemic drugs: Secondary | ICD-10-CM | POA: Diagnosis not present

## 2018-05-06 DIAGNOSIS — L409 Psoriasis, unspecified: Secondary | ICD-10-CM | POA: Diagnosis not present

## 2018-05-07 DIAGNOSIS — E538 Deficiency of other specified B group vitamins: Secondary | ICD-10-CM | POA: Diagnosis not present

## 2018-05-29 DIAGNOSIS — Z6832 Body mass index (BMI) 32.0-32.9, adult: Secondary | ICD-10-CM | POA: Diagnosis not present

## 2018-05-29 DIAGNOSIS — M5416 Radiculopathy, lumbar region: Secondary | ICD-10-CM | POA: Diagnosis not present

## 2018-05-29 DIAGNOSIS — M545 Low back pain: Secondary | ICD-10-CM | POA: Diagnosis not present

## 2018-06-02 DIAGNOSIS — E11319 Type 2 diabetes mellitus with unspecified diabetic retinopathy without macular edema: Secondary | ICD-10-CM | POA: Diagnosis not present

## 2018-06-02 DIAGNOSIS — E538 Deficiency of other specified B group vitamins: Secondary | ICD-10-CM | POA: Diagnosis not present

## 2018-06-02 DIAGNOSIS — I2510 Atherosclerotic heart disease of native coronary artery without angina pectoris: Secondary | ICD-10-CM | POA: Diagnosis not present

## 2018-06-02 DIAGNOSIS — Z794 Long term (current) use of insulin: Secondary | ICD-10-CM | POA: Diagnosis not present

## 2018-06-03 DIAGNOSIS — L4059 Other psoriatic arthropathy: Secondary | ICD-10-CM | POA: Diagnosis not present

## 2018-06-09 DIAGNOSIS — E538 Deficiency of other specified B group vitamins: Secondary | ICD-10-CM | POA: Diagnosis not present

## 2018-06-17 DIAGNOSIS — L4059 Other psoriatic arthropathy: Secondary | ICD-10-CM | POA: Diagnosis not present

## 2018-06-17 DIAGNOSIS — M2550 Pain in unspecified joint: Secondary | ICD-10-CM | POA: Diagnosis not present

## 2018-06-17 DIAGNOSIS — L409 Psoriasis, unspecified: Secondary | ICD-10-CM | POA: Diagnosis not present

## 2018-06-17 DIAGNOSIS — Z79899 Other long term (current) drug therapy: Secondary | ICD-10-CM | POA: Diagnosis not present

## 2018-06-17 DIAGNOSIS — E669 Obesity, unspecified: Secondary | ICD-10-CM | POA: Diagnosis not present

## 2018-06-17 DIAGNOSIS — Z6831 Body mass index (BMI) 31.0-31.9, adult: Secondary | ICD-10-CM | POA: Diagnosis not present

## 2018-07-07 DIAGNOSIS — M5416 Radiculopathy, lumbar region: Secondary | ICD-10-CM | POA: Diagnosis not present

## 2018-07-09 ENCOUNTER — Other Ambulatory Visit: Payer: Self-pay | Admitting: Interventional Cardiology

## 2018-07-14 DIAGNOSIS — E538 Deficiency of other specified B group vitamins: Secondary | ICD-10-CM | POA: Diagnosis not present

## 2018-08-17 DIAGNOSIS — Z961 Presence of intraocular lens: Secondary | ICD-10-CM | POA: Diagnosis not present

## 2018-08-17 DIAGNOSIS — H524 Presbyopia: Secondary | ICD-10-CM | POA: Diagnosis not present

## 2018-08-17 DIAGNOSIS — E113293 Type 2 diabetes mellitus with mild nonproliferative diabetic retinopathy without macular edema, bilateral: Secondary | ICD-10-CM | POA: Diagnosis not present

## 2018-08-19 DIAGNOSIS — E538 Deficiency of other specified B group vitamins: Secondary | ICD-10-CM | POA: Diagnosis not present

## 2018-08-26 DIAGNOSIS — M5416 Radiculopathy, lumbar region: Secondary | ICD-10-CM | POA: Diagnosis not present

## 2018-09-09 ENCOUNTER — Ambulatory Visit: Payer: Medicare Other | Admitting: Interventional Cardiology

## 2018-09-16 DIAGNOSIS — Z79899 Other long term (current) drug therapy: Secondary | ICD-10-CM | POA: Diagnosis not present

## 2018-09-16 DIAGNOSIS — L409 Psoriasis, unspecified: Secondary | ICD-10-CM | POA: Diagnosis not present

## 2018-09-16 DIAGNOSIS — E669 Obesity, unspecified: Secondary | ICD-10-CM | POA: Diagnosis not present

## 2018-09-16 DIAGNOSIS — L4059 Other psoriatic arthropathy: Secondary | ICD-10-CM | POA: Diagnosis not present

## 2018-09-16 DIAGNOSIS — Z6831 Body mass index (BMI) 31.0-31.9, adult: Secondary | ICD-10-CM | POA: Diagnosis not present

## 2018-09-16 DIAGNOSIS — M2550 Pain in unspecified joint: Secondary | ICD-10-CM | POA: Diagnosis not present

## 2018-09-16 DIAGNOSIS — M150 Primary generalized (osteo)arthritis: Secondary | ICD-10-CM | POA: Diagnosis not present

## 2018-09-16 NOTE — Progress Notes (Signed)
Cardiology Office Note:    Date:  09/17/2018   ID:  [name], DOB 04-14-1934, MRN [id number]  PCP:  [name], MD  Cardiologist:  Lesleigh Noe, MD   Referring MD: [name], MD   Chief Complaint  Patient presents with  . Coronary Artery Disease   History of Present Illness:    [name] is a 84 y.o. female with a hx of CAD s/p CABG (2006 with LIMA-->LAD, SVG-->D1 and SVG--> OM; low risk nuclear stress test in 06/2015), PAF on pradaxa, RA, HTN and DMT2 who presents to clinic for follow up.  She denies angina.  Mild dyspnea on exertion.  No orthopnea, PND, or nitroglycerin use.  No prolonged episodes of palpitation.  She denies peripheral edema.  Overall from cardiac standpoint she is doing well.  Limitations are related to musculoskeletal pain from possible psoriatic arthritis, cervical and lumbar disc disease.  Past Medical History:  Diagnosis Date  . Anginal pain (HCC)    "saw doctor-think it was esophagus spasm"  . Arthritis   . Atrial flutter (HCC) 11/02/2013  . Cataract    left eye  . Coronary artery disease   . Diabetes mellitus without complication (HCC)    type 2  . Essential hypertension, benign 11/02/2013  . H/O jaundice    as child  . Headache 2015   complicated migraine x2   . Hearing trouble   . Heart disease   . Hepatitis    hx of hepatitis A as child  . High cholesterol    under control  . Long term (current) use of anticoagulants 11/02/2013   Pradaxa   . Measles    as child  . Mouth dryness   . Mumps    as teenager  . Numbness and tingling    lips  . Osteoporosis    "unsure-maybe in knees"  . Psoriasis   . Psoriatic arthritis (HCC)   . Stroke (HCC) 2012  . Vertigo    being monitored, occasional    Past Surgical History:  Procedure Laterality Date  . APPENDECTOMY  as child  . CARDIAC CATHETERIZATION  ~2006   clear  . CORONARY ARTERY BYPASS GRAFT  02/16/2005  . LUMBAR DISC SURGERY  09/2004   L1-L5  .  TONSILLECTOMY  as child   with adenoids  . TOTAL KNEE ARTHROPLASTY Left 09/11/2015   Procedure: LEFT TOTAL KNEE ARTHROPLASTY;  Surgeon: Ollen Gross, MD;  Location: [facility];  Service: Orthopedics;  Laterality: Left;    Current Medications: Current Meds  Medication Sig  . acetaminophen (TYLENOL) 500 MG tablet Take 500 mg by mouth every 6 (six) hours as needed for mild pain.  . calcium elemental as carbonate (TUMS ULTRA 1000) 400 MG chewable tablet Chew 1,000 mg by mouth every evening.  . carvedilol (COREG) 25 MG tablet TAKE (1) TABLET BY MOUTH TWICE A DAY WITH FOOD.  . cholecalciferol (VITAMIN D) 1000 units tablet Take 2,000 Units by mouth daily.  . clobetasol cream (TEMOVATE) 0.05 % Apply 1 application topically 2 (two) times daily as needed (skin irritation).  . clonazePAM (KLONOPIN) 0.5 MG tablet Take 0.25-0.5 mg by mouth at bedtime as needed (for sleep).   . Cyanocobalamin (VITAMIN B-12 IJ) Take one (1) injection subcutaneously monthly as directed.  . cyclobenzaprine (FLEXERIL) 10 MG tablet Take 10 mg by mouth 3 (three) times daily as needed for muscle spasms.  . dapagliflozin propanediol (FARXIGA) 5 MG TABS tablet Take 5 mg by mouth daily.  Marland Kitchen  docusate sodium (COLACE) 100 MG capsule Take 100 mg by mouth daily as needed for mild constipation.  . Glycerin (OASIS MOISTURIZING MOUTH SPRAY MT) Use as directed 2-3 sprays in the mouth or throat. During the night when pt wakes up with dry mouth.  Marland Kitchen LANTUS SOLOSTAR 100 UNIT/ML Solostar Pen Inject 46 Units into the skin every morning.   . Loratadine (CLARITIN PO) Take 1 tablet by mouth daily.  . metFORMIN (GLUCOPHAGE) 500 MG tablet Take 1,000 mg by mouth 2 (two) times daily.  . nitroGLYCERIN (NITROSTAT) 0.4 MG SL tablet Place 0.4 mg under the tongue every 5 (five) minutes x 3 doses as needed for chest pain.  Marland Kitchen nystatin-triamcinolone ointment (MYCOLOG) Apply 1 application topically as directed.   Marland Kitchen PRADAXA 150 MG CAPS Take 150 mg by mouth 2 (two)  times daily.   . Probiotic Product (ALIGN PO) Take 1 capsule by mouth daily.  . ramipril (ALTACE) 5 MG capsule Take 1 capsule (5 mg total) by mouth 2 (two) times daily.  . rosuvastatin (CRESTOR) 10 MG tablet Take 10 mg by mouth at bedtime.   . senna (SENOKOT) 8.6 MG tablet Take 1 tablet by mouth at bedtime as needed for constipation.     Allergies:   Codeine; Penicillins; and Atorvastatin   Social History   Socioeconomic History  . Marital status: Married    Spouse name: Not on file  . Number of children: 3  . Years of education: BS  . Highest education level: Not on file  Occupational History  . Not on file  Social Needs  . Financial resource strain: Not on file  . Food insecurity:    Worry: Not on file    Inability: Not on file  . Transportation needs:    Medical: Not on file    Non-medical: Not on file  Tobacco Use  . Smoking status: Never Smoker  . Smokeless tobacco: Never Used  Substance and Sexual Activity  . Alcohol use: No    Alcohol/week: 0.0 standard drinks  . Drug use: No  . Sexual activity: Not on file  Lifestyle  . Physical activity:    Days per week: Not on file    Minutes per session: Not on file  . Stress: Not on file  Relationships  . Social connections:    Talks on phone: Not on file    Gets together: Not on file    Attends religious service: Not on file    Active member of club or organization: Not on file    Attends meetings of clubs or organizations: Not on file    Relationship status: Not on file  Other Topics Concern  . Not on file  Social History Narrative   Patient is married with 3 children.   Patient is left handed.   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   Patient drinks 3 cups daily.     Family History: The patient's family history includes Heart failure in her mother; Neuromuscular disorder in her father.  ROS:   Please see the history of present illness.    Back pain, difficulty with balance, constipation,  occasional skipped heartbeat, hearing loss, and recent chills.  All other systems reviewed and are negative.  EKGs/Labs/Other Studies Reviewed:    The following studies were reviewed today:  Most recent nuclear study 07/25/2015: Study Highlights    Nuclear stress EF: 72%.  The study is normal.  This is a low risk study.  The left ventricular  ejection fraction is normal (55-65%).       EKG:  EKG is  ordered today.  The ekg ordered today demonstrates Sinus rhythm, borderline first-degree AV block, left bundle branch block/nonspecific interventricular conduction delay with QRS duration 138 ms.  Recent Labs: No results found for requested labs within last 8760 hours.  Recent Lipid Panel    Component Value Date/Time   CHOL 137 08/05/2014 0453   TRIG 105 08/05/2014 0453   HDL 65 08/05/2014 0453   CHOLHDL 2.1 08/05/2014 0453   VLDL 21 08/05/2014 0453   LDLCALC 51 08/05/2014 0453    Physical Exam:    VS:  BP 116/60   Pulse 68   Ht 5\' 2"  (1.575 m)   Wt 171 lb 12.8 oz (77.9 kg)   BMI 31.42 kg/m     Wt Readings from Last 3 Encounters:  09/17/18 171 lb 12.8 oz (77.9 kg)  07/07/17 176 lb 6.4 oz (80 kg)  04/22/17 179 lb 1.9 oz (81.2 kg)     GEN: Elderly but compensated.  Well nourished, well developed in no acute distress HEENT: Normal NECK: No JVD. LYMPHATICS: No lymphadenopathy CARDIAC: RRR, no murmur, no gallop, trace bilateral edema. VASCULAR: 2+ bilateral radial pulses.  No bruits. RESPIRATORY:  Clear to auscultation without rales, wheezing or rhonchi  ABDOMEN: Soft, non-tender, non-distended, No pulsatile mass, MUSCULOSKELETAL: No deformity  SKIN: Warm and dry NEUROLOGIC:  Alert and oriented x 3 PSYCHIATRIC:  Normal affect   ASSESSMENT:    1. Coronary artery disease involving coronary bypass graft of native heart with angina pectoris (HCC)   2. Atypical atrial flutter (HCC)   3. Long term current use of anticoagulant therapy   4. TIA (transient ischemic  attack)   5. Essential hypertension, benign    PLAN:    In order of problems listed above:  1. Stable with out angina pectoris now multiple years out from coronary bypass grafting.  No imaging follow-up needed.  She is relatively sedentary.  This should be considered especially if any symptoms of concern develop. 2. No clinical recurrence 3. Pradaxa without side effects or complications. 4. No recurrence 5. Target 130/80 mmHg. 6. LDL is at target and less than 70.  Encouraged aerobic activity.  Encouraged follow-up if any chest symptoms.  Otherwise plan clinical follow-up in 1 year.   Medication Adjustments/Labs and Tests Ordered: Current medicines are reviewed at length with the patient today.  Concerns regarding medicines are outlined above.  Orders Placed This Encounter  Procedures  . EKG 12-Lead   No orders of the defined types were placed in this encounter.   Patient Instructions  Medication Instructions:  Your physician recommends that you continue on your current medications as directed. Please refer to the Current Medication list given to you today.  Labwork: None  Testing/Procedures: None  Follow-Up: Your physician wants you to follow-up in: 9-12 months with Dr. Katrinka Blazing. You will receive a reminder letter in the mail two months in advance. If you don't receive a letter, please call our office to schedule the follow-up appointment.   Any Other Special Instructions Will Be Listed Below (If Applicable).     If you need a refill on your cardiac medications before your next appointment, please call your pharmacy.      Signed, Lesleigh Noe, MD  09/17/2018 12:29 PM    [facility]

## 2018-09-17 ENCOUNTER — Ambulatory Visit (INDEPENDENT_AMBULATORY_CARE_PROVIDER_SITE_OTHER): Payer: Medicare Other | Admitting: Interventional Cardiology

## 2018-09-17 ENCOUNTER — Encounter: Payer: Self-pay | Admitting: Interventional Cardiology

## 2018-09-17 VITALS — BP 116/60 | HR 68 | Ht 62.0 in | Wt 171.8 lb

## 2018-09-17 DIAGNOSIS — I484 Atypical atrial flutter: Secondary | ICD-10-CM | POA: Diagnosis not present

## 2018-09-17 DIAGNOSIS — G459 Transient cerebral ischemic attack, unspecified: Secondary | ICD-10-CM | POA: Diagnosis not present

## 2018-09-17 DIAGNOSIS — Z7901 Long term (current) use of anticoagulants: Secondary | ICD-10-CM

## 2018-09-17 DIAGNOSIS — I25709 Atherosclerosis of coronary artery bypass graft(s), unspecified, with unspecified angina pectoris: Secondary | ICD-10-CM

## 2018-09-17 DIAGNOSIS — I10 Essential (primary) hypertension: Secondary | ICD-10-CM

## 2018-09-17 NOTE — Patient Instructions (Addendum)
Medication Instructions:  Your physician recommends that you continue on your current medications as directed. Please refer to the Current Medication list given to you today.  Labwork: None  Testing/Procedures: None  Follow-Up: Your physician wants you to follow-up in: 9-12 months with Dr. Smith.  You will receive a reminder letter in the mail two months in advance. If you don't receive a letter, please call our office to schedule the follow-up appointment.   Any Other Special Instructions Will Be Listed Below (If Applicable).     If you need a refill on your cardiac medications before your next appointment, please call your pharmacy.   

## 2018-09-21 DIAGNOSIS — E538 Deficiency of other specified B group vitamins: Secondary | ICD-10-CM | POA: Diagnosis not present

## 2018-09-21 DIAGNOSIS — Z23 Encounter for immunization: Secondary | ICD-10-CM | POA: Diagnosis not present

## 2018-10-05 ENCOUNTER — Other Ambulatory Visit: Payer: Self-pay | Admitting: Interventional Cardiology

## 2018-10-09 DIAGNOSIS — E11319 Type 2 diabetes mellitus with unspecified diabetic retinopathy without macular edema: Secondary | ICD-10-CM | POA: Diagnosis not present

## 2018-10-09 DIAGNOSIS — Z794 Long term (current) use of insulin: Secondary | ICD-10-CM | POA: Diagnosis not present

## 2018-10-09 DIAGNOSIS — E538 Deficiency of other specified B group vitamins: Secondary | ICD-10-CM | POA: Diagnosis not present

## 2018-10-09 DIAGNOSIS — R110 Nausea: Secondary | ICD-10-CM | POA: Diagnosis not present

## 2018-10-09 DIAGNOSIS — R399 Unspecified symptoms and signs involving the genitourinary system: Secondary | ICD-10-CM | POA: Diagnosis not present

## 2018-10-09 DIAGNOSIS — I2510 Atherosclerotic heart disease of native coronary artery without angina pectoris: Secondary | ICD-10-CM | POA: Diagnosis not present

## 2018-10-27 DIAGNOSIS — E538 Deficiency of other specified B group vitamins: Secondary | ICD-10-CM | POA: Diagnosis not present

## 2018-10-30 DIAGNOSIS — I10 Essential (primary) hypertension: Secondary | ICD-10-CM | POA: Diagnosis not present

## 2018-10-30 DIAGNOSIS — K5901 Slow transit constipation: Secondary | ICD-10-CM | POA: Diagnosis not present

## 2018-10-30 DIAGNOSIS — Z1389 Encounter for screening for other disorder: Secondary | ICD-10-CM | POA: Diagnosis not present

## 2018-10-30 DIAGNOSIS — F5101 Primary insomnia: Secondary | ICD-10-CM | POA: Diagnosis not present

## 2018-10-30 DIAGNOSIS — Z0000 Encounter for general adult medical examination without abnormal findings: Secondary | ICD-10-CM | POA: Diagnosis not present

## 2018-10-30 DIAGNOSIS — I878 Other specified disorders of veins: Secondary | ICD-10-CM | POA: Diagnosis not present

## 2018-10-30 DIAGNOSIS — E538 Deficiency of other specified B group vitamins: Secondary | ICD-10-CM | POA: Diagnosis not present

## 2018-11-15 ENCOUNTER — Emergency Department (HOSPITAL_COMMUNITY): Payer: Medicare Other

## 2018-11-15 ENCOUNTER — Encounter (HOSPITAL_COMMUNITY): Payer: Self-pay

## 2018-11-15 ENCOUNTER — Other Ambulatory Visit: Payer: Self-pay

## 2018-11-15 ENCOUNTER — Emergency Department (HOSPITAL_COMMUNITY)
Admission: EM | Admit: 2018-11-15 | Discharge: 2018-11-16 | Disposition: A | Discharge status: Home / Self Care | Payer: Medicare Other | Attending: Emergency Medicine | Admitting: Emergency Medicine

## 2018-11-15 DIAGNOSIS — R4701 Aphasia: Secondary | ICD-10-CM | POA: Diagnosis not present

## 2018-11-15 DIAGNOSIS — Z79899 Other long term (current) drug therapy: Secondary | ICD-10-CM | POA: Diagnosis not present

## 2018-11-15 DIAGNOSIS — R531 Weakness: Secondary | ICD-10-CM | POA: Diagnosis not present

## 2018-11-15 DIAGNOSIS — Z96652 Presence of left artificial knee joint: Secondary | ICD-10-CM | POA: Diagnosis not present

## 2018-11-15 DIAGNOSIS — Z7984 Long term (current) use of oral hypoglycemic drugs: Secondary | ICD-10-CM | POA: Insufficient documentation

## 2018-11-15 DIAGNOSIS — I10 Essential (primary) hypertension: Secondary | ICD-10-CM | POA: Diagnosis not present

## 2018-11-15 DIAGNOSIS — R4789 Other speech disturbances: Secondary | ICD-10-CM | POA: Diagnosis not present

## 2018-11-15 DIAGNOSIS — E119 Type 2 diabetes mellitus without complications: Secondary | ICD-10-CM | POA: Diagnosis not present

## 2018-11-15 DIAGNOSIS — R4781 Slurred speech: Secondary | ICD-10-CM | POA: Diagnosis not present

## 2018-11-15 DIAGNOSIS — R479 Unspecified speech disturbances: Secondary | ICD-10-CM | POA: Diagnosis not present

## 2018-11-15 LAB — CBC
HCT: 40 % (ref 36.0–46.0)
Hemoglobin: 12.6 g/dL (ref 12.0–15.0)
MCH: 28.3 pg (ref 26.0–34.0)
MCHC: 31.5 g/dL (ref 30.0–36.0)
MCV: 89.7 fL (ref 80.0–100.0)
NRBC: 0 % (ref 0.0–0.2)
PLATELETS: 165 10*3/uL (ref 150–400)
RBC: 4.46 MIL/uL (ref 3.87–5.11)
RDW: 12.4 % (ref 11.5–15.5)
WBC: 6.7 10*3/uL (ref 4.0–10.5)

## 2018-11-15 LAB — COMPREHENSIVE METABOLIC PANEL
ALBUMIN: 3.7 g/dL (ref 3.5–5.0)
ALT: 16 U/L (ref 0–44)
ANION GAP: 6 (ref 5–15)
AST: 20 U/L (ref 15–41)
Alkaline Phosphatase: 72 U/L (ref 38–126)
BUN: 18 mg/dL (ref 8–23)
CHLORIDE: 106 mmol/L (ref 98–111)
CO2: 26 mmol/L (ref 22–32)
CREATININE: 0.72 mg/dL (ref 0.44–1.00)
Calcium: 9.2 mg/dL (ref 8.9–10.3)
GFR calc non Af Amer: >60 mL/min (ref >60)
Glucose, Bld: 224 mg/dL — ABNORMAL HIGH (ref 70–99)
Potassium: 4 mmol/L (ref 3.5–5.1)
SODIUM: 138 mmol/L (ref 135–145)
Total Bilirubin: 0.3 mg/dL (ref 0.3–1.2)
Total Protein: 6.5 g/dL (ref 6.5–8.1)

## 2018-11-15 LAB — I-STAT CHEM 8, ED
BUN: 22 mg/dL (ref 8–23)
CALCIUM ION: 1.13 mmol/L — AB (ref 1.15–1.40)
CHLORIDE: 106 mmol/L (ref 98–111)
CREATININE: 0.7 mg/dL (ref 0.44–1.00)
Glucose, Bld: 214 mg/dL — ABNORMAL HIGH (ref 70–99)
HEMATOCRIT: 36 % (ref 36.0–46.0)
Hemoglobin: 12.2 g/dL (ref 12.0–15.0)
Potassium: 4.2 mmol/L (ref 3.5–5.1)
Sodium: 141 mmol/L (ref 135–145)
TCO2: 26 mmol/L (ref 22–32)

## 2018-11-15 LAB — CBG MONITORING, ED: GLUCOSE-CAPILLARY: 191 mg/dL — AB (ref 70–99)

## 2018-11-15 LAB — URINALYSIS, ROUTINE W REFLEX MICROSCOPIC
Bilirubin Urine: NEGATIVE
Glucose, UA: 50 mg/dL — AB
Hgb urine dipstick: NEGATIVE
Ketones, ur: NEGATIVE mg/dL
Leukocytes, UA: NEGATIVE
NITRITE: NEGATIVE
Protein, ur: NEGATIVE mg/dL
SPECIFIC GRAVITY, URINE: 1.017 (ref 1.005–1.030)
pH: 5 (ref 5.0–8.0)

## 2018-11-15 LAB — DIFFERENTIAL
ABS IMMATURE GRANULOCYTES: 0.01 10*3/uL (ref 0.00–0.07)
BASOS ABS: 0 10*3/uL (ref 0.0–0.1)
BASOS PCT: 0 %
EOS ABS: 0.2 10*3/uL (ref 0.0–0.5)
Eosinophils Relative: 3 %
IMMATURE GRANULOCYTES: 0 %
Lymphocytes Relative: 39 %
Lymphs Abs: 2.6 10*3/uL (ref 0.7–4.0)
Monocytes Absolute: 0.6 10*3/uL (ref 0.1–1.0)
Monocytes Relative: 9 %
Neutro Abs: 3.2 10*3/uL (ref 1.7–7.7)
Neutrophils Relative %: 49 %

## 2018-11-15 LAB — I-STAT TROPONIN, ED: Troponin i, poc: 0 ng/mL (ref 0.00–0.08)

## 2018-11-15 LAB — RAPID URINE DRUG SCREEN, HOSP PERFORMED
Amphetamines: NOT DETECTED
Barbiturates: NOT DETECTED
Benzodiazepines: NOT DETECTED
Cocaine: NOT DETECTED
Opiates: NOT DETECTED
Tetrahydrocannabinol: NOT DETECTED

## 2018-11-15 LAB — PROTIME-INR
INR: 1.98
PROTHROMBIN TIME: 22.2 s — AB (ref 11.4–15.2)

## 2018-11-15 LAB — ETHANOL: Alcohol, Ethyl (B): <10 mg/dL (ref <10)

## 2018-11-15 LAB — APTT: aPTT: 67 seconds — ABNORMAL HIGH (ref 24–36)

## 2018-11-15 MED ORDER — IOPAMIDOL (ISOVUE-370) INJECTION 76%
75.0000 mL | Freq: Once | INTRAVENOUS | Status: AC | PRN
  Administered 2018-11-15: 75 mL via INTRAVENOUS

## 2018-11-15 MED ORDER — LORAZEPAM 2 MG/ML IJ SOLN
0.5000 mg | Freq: Once | INTRAMUSCULAR | Status: AC
  Administered 2018-11-15: 0.5 mg via INTRAVENOUS
  Filled 2018-11-15: qty 1

## 2018-11-15 MED ORDER — IOPAMIDOL (ISOVUE-370) INJECTION 76%
INTRAVENOUS | Status: AC
  Filled 2018-11-15: qty 100

## 2018-11-15 MED ORDER — LORAZEPAM 1 MG PO TABS: 0.5000 mg | ORAL_TABLET | Freq: Once | ORAL | Status: DC

## 2018-11-15 NOTE — ED Notes (Signed)
Patient transported to MRI 

## 2018-11-15 NOTE — Discharge Instructions (Addendum)
Follow-up with your neurologist. Have them review labs and imaging studies from today's visit. Return here for any new/acute changes.

## 2018-11-15 NOTE — ED Notes (Signed)
Patient transported to CT 

## 2018-11-15 NOTE — ED Notes (Signed)
Family member arrived to triage stating he had a "Code Stroke" in the car.  Repots pt with expressive aphasia.  Visitor unsure when symptoms started.  States "probably around noon."  States his wife was with pt.  Pt VAN negative.  No arm drift.  States she is having problems remembering things.  Pt to treatment room.

## 2018-11-15 NOTE — ED Provider Notes (Signed)
[facility] EMERGENCY DEPARTMENT Provider Note   CSN: [id number] Arrival date & time: 11/15/18  1732     History   Chief Complaint Chief Complaint  Patient presents with  . Cerebrovascular Accident    HPI [name] is a 84 y.o. female.  HPI Patient is an 84 year old female with past medical history of atrial flutter, coronary artery disease, diabetes, hypertension, hyperlipidemia, and previous stroke who presents to the emergency department for evaluation of strokelike symptoms.  Patient reportedly had word finding difficulty since approximately 12 this morning.  Patient's son is at bedside and provides part of the history.  He states that he saw his mother at approximately 8 AM at which time she was at her neurological baseline.  His wife then went over to his mother's home at approximately noon and at that time she was having difficulty with word finding.  She has also been having slurred speech since that time.  Her only other complaint at this time is of a mild right-sided headache.  She has not had any numbness or weakness, but she does report some difficulty with fine motor skills that she was unable to use her glucometer earlier this afternoon.  Her son reports previous episodes of stroke/TIA related to episodes of atrial flutter that were somewhat similar in nature to her current presentation.  Remaining review of systems as below.  Past Medical History:  Diagnosis Date  . Anginal pain ([place])    "saw doctor-think it was esophagus spasm"  . Arthritis   . Atrial flutter ([place]) 11/02/2013  . Cataract    left eye  . Coronary artery disease   . Diabetes mellitus without complication ([place])    type 2  . Essential hypertension, benign 11/02/2013  . H/O jaundice    as child  . Headache 4944   complicated migraine x2   . Hearing trouble   . Heart disease   . Hepatitis    hx of hepatitis A as child  . High cholesterol    under control  . Long term  (current) use of anticoagulants 11/02/2013   Pradaxa   . Measles    as child  . Mouth dryness   . Mumps    as teenager  . Numbness and tingling    lips  . Osteoporosis    "unsure-maybe in knees"  . Psoriasis   . Psoriatic arthritis ([place])   . Stroke ([place]) 2012  . Vertigo    being monitored, occasional    Patient Active Problem List   Diagnosis Date Noted  . OA (osteoarthritis) of knee 09/11/2015  . BPPV (benign paroxysmal positional vertigo) 10/31/2014  . Complicated migraine 96/75/9163  . TIA (transient ischemic attack) 08/04/2014  . Coronary artery disease involving coronary bypass graft of native heart with angina pectoris ([place]) 11/02/2013    Class: Chronic  . Atrial flutter ([place]) 11/02/2013    Class: Chronic  . Essential hypertension, benign 11/02/2013  . Long term current use of anticoagulant therapy 11/02/2013    Class: Acute    Past Surgical History:  Procedure Laterality Date  . APPENDECTOMY  as child  . CARDIAC CATHETERIZATION  ~2006   clear  . CORONARY ARTERY BYPASS GRAFT  02/16/2005  . LUMBAR DISC SURGERY  09/2004   L1-L5  . TONSILLECTOMY  as child   with adenoids  . TOTAL KNEE ARTHROPLASTY Left 09/11/2015   Procedure: LEFT TOTAL KNEE ARTHROPLASTY;  Surgeon: Gaynelle Arabian, MD;  Location: [facility];  Service:  Orthopedics;  Laterality: Left;     OB History   None      Home Medications    Prior to Admission medications   Medication Sig Start Date End Date Taking? Authorizing Provider  acetaminophen (TYLENOL) 500 MG tablet Take 500 mg by mouth every 6 (six) hours as needed for mild pain.   Yes [provider]  calcium elemental as carbonate (TUMS ULTRA 1000) 400 MG chewable tablet Chew 1,000 mg by mouth every evening.   Yes [provider]  carvedilol (COREG) 25 MG tablet TAKE (1) TABLET TWICE A DAY WITH FOOD. Patient taking differently: 25 mg 2 (two) times daily with a meal.  10/05/18  Yes Belva Crome, MD  cholecalciferol (VITAMIN D)  1000 units tablet Take 2,000 Units by mouth daily.   Yes [provider]  clobetasol cream (TEMOVATE) 0.53 % Apply 1 application topically 2 (two) times daily as needed (skin irritation).   Yes [provider]  clonazePAM (KLONOPIN) 0.5 MG tablet Take 0.5 mg by mouth at bedtime as needed (for sleep).  06/16/12  Yes [provider]  cyclobenzaprine (FLEXERIL) 10 MG tablet Take 10 mg by mouth 3 (three) times daily as needed for muscle spasms.   Yes [provider]  docusate sodium (COLACE) 100 MG capsule Take 100 mg by mouth daily as needed for mild constipation.   Yes [provider]  LANTUS SOLOSTAR 100 UNIT/ML Solostar Pen Inject 56 Units into the skin every morning.  10/19/15  Yes [provider]  Loratadine (CLARITIN PO) Take 1 tablet by mouth daily.   Yes [provider]  Melatonin 5 MG TABS Take 5 mg by mouth every evening.   Yes [provider]  metFORMIN (GLUCOPHAGE) 500 MG tablet Take 1,000 mg by mouth 2 (two) times daily. 07/13/14  Yes [provider]  nitroGLYCERIN (NITROSTAT) 0.4 MG SL tablet Place 0.4 mg under the tongue every 5 (five) minutes x 3 doses as needed for chest pain.   Yes [provider]  nystatin-triamcinolone ointment (MYCOLOG) Apply 1 application topically as directed.  10/03/14  Yes [provider]  polyethylene glycol (MIRALAX / GLYCOLAX) packet Take 17 g by mouth daily.   Yes [provider]  PRADAXA 150 MG CAPS Take 150 mg by mouth 2 (two) times daily.  07/18/12  Yes [provider]  Probiotic Product (ALIGN PO) Take 1 capsule by mouth daily.   Yes [provider]  ramipril (ALTACE) 5 MG capsule Take 1 capsule (5 mg total) by mouth 2 (two) times daily. 07/09/18  Yes Belva Crome, MD  rosuvastatin (CRESTOR) 10 MG tablet Take 10 mg by mouth at bedtime.    Yes [provider]  senna (SENOKOT) 8.6 MG tablet Take 1 tablet by mouth at bedtime as  needed for constipation.   Yes [provider]  traZODone (DESYREL) 50 MG tablet Take 25 mg by mouth at bedtime. 10/30/18  Yes [provider]  vitamin B-12 (CYANOCOBALAMIN) 250 MCG tablet Take 250 mcg by mouth daily.   Yes [provider]    Family History Family History  Problem Relation Age of Onset  . Heart failure Mother   . Neuromuscular disorder Father     Social History Social History   Tobacco Use  . Smoking status: Never Smoker  . Smokeless tobacco: Never Used  Substance Use Topics  . Alcohol use: No    Alcohol/week: 0.0 standard drinks  . Drug use: No  Allergies   Codeine; Penicillins; Atenolol; Atorvastatin; Metoprolol; and Zetia [ezetimibe]   Review of Systems Review of Systems  Constitutional: Negative for chills and fever.  HENT: Negative for ear pain and sore throat.   Eyes: Negative for pain and visual disturbance.  Respiratory: Negative for cough and shortness of breath.   Cardiovascular: Negative for chest pain and palpitations.  Gastrointestinal: Negative for abdominal pain and vomiting.  Genitourinary: Negative for dysuria and hematuria.  Musculoskeletal: Negative for arthralgias and back pain.  Skin: Negative for color change and rash.  Neurological: Positive for speech difficulty. Negative for tremors, seizures, syncope and weakness.       Some difficulty with fine motor movements.  All other systems reviewed and are negative.    Physical Exam Updated Vital Signs BP (!) 149/73   Pulse 87   Resp (!) 21   Ht 5\' 4"  (1.626 m)   Wt 82.1 kg   SpO2 97%   BMI 31.07 kg/m   Physical Exam  Constitutional: She is oriented to person, place, and time. She appears well-developed and well-nourished. No distress.  HENT:  Head: Normocephalic and atraumatic.  Eyes: Pupils are equal, round, and reactive to light. Conjunctivae and EOM are normal.  Neck: Neck supple.  Cardiovascular: Normal rate and regular rhythm.    Pulmonary/Chest: Effort normal. No respiratory distress.  Abdominal: Soft. There is no tenderness.  Musculoskeletal: She exhibits no edema.  Neurological: She is alert and oriented to person, place, and time. She is not disoriented. She displays no atrophy and no tremor. No cranial nerve deficit or sensory deficit. She exhibits normal muscle tone. She displays no seizure activity. Coordination normal. GCS eye subscore is 4. GCS verbal subscore is 5. GCS motor subscore is 6.  No evidence of neglect.  Patient reported to have mild slurring of speech by her family at bedside.  This improved throughout her stay.  Patient with no focal weakness on exam.  Denies sensory deficit.  Denies visual changes.  Skin: Skin is warm and dry.  Psychiatric: She has a normal mood and affect.  Nursing note and vitals reviewed.    ED Treatments / Results  Labs (all labs ordered are listed, but only abnormal results are displayed) Labs Reviewed  PROTIME-INR - Abnormal; Notable for the following components:      Result Value   Prothrombin Time 22.2 (*)    All other components within normal limits  APTT - Abnormal; Notable for the following components:   aPTT 67 (*)    All other components within normal limits  COMPREHENSIVE METABOLIC PANEL - Abnormal; Notable for the following components:   Glucose, Bld 224 (*)    All other components within normal limits  URINALYSIS, ROUTINE W REFLEX MICROSCOPIC - Abnormal; Notable for the following components:   Glucose, UA 50 (*)    All other components within normal limits  CBG MONITORING, ED - Abnormal; Notable for the following components:   Glucose-Capillary 191 (*)    All other components within normal limits  I-STAT CHEM 8, ED - Abnormal; Notable for the following components:   Glucose, Bld 214 (*)    Calcium, Ion 1.13 (*)    All other components within normal limits  ETHANOL  CBC  DIFFERENTIAL  RAPID URINE DRUG SCREEN, HOSP PERFORMED  I-STAT TROPONIN, ED     EKG EKG Interpretation  Date/Time:  Sunday November 15 2018 17:43:24 EST Ventricular Rate:  87 PR Interval:    QRS Duration: 134 QT Interval:  404 QTC Calculation: 486 R Axis:   -17 Text Interpretation:  Sinus rhythm Nonspecific IVCD with LAD Anteroseptal infarct, old No STEMI.  Confirmed by Nanda Quinton [phone]) on 11/15/2018 5:48:56 PM   Radiology Ct Head Wo Contrast  Result Date: 11/15/2018 CLINICAL DATA:  Expressive aphasia.  Left hand weakness. EXAM: CT HEAD WITHOUT CONTRAST TECHNIQUE: Contiguous axial images were obtained from the base of the skull through the vertex without intravenous contrast. COMPARISON:  MRI 08/05/2014.  CT 08/04/2014. FINDINGS: Brain: Chronic microvascular disease throughout the deep white matter. Mild cerebral atrophy. No acute intracranial abnormality. Specifically, no hemorrhage, hydrocephalus, mass lesion, acute infarction, or significant intracranial injury. Vascular: No hyperdense vessel or unexpected calcification. Skull: No acute calvarial abnormality. Sinuses/Orbits: Visualized paranasal sinuses and mastoids clear. Orbital soft tissues unremarkable. Other: None IMPRESSION: No acute intracranial abnormality. Atrophy, chronic microvascular disease. Electronically Signed   By: Rolm Baptise M.D.   On: 11/15/2018 19:23   Mr Brain Wo Contrast  Result Date: 11/15/2018 CLINICAL DATA:  Expressive aphasia and left hand weakness. EXAM: MRI HEAD WITHOUT CONTRAST TECHNIQUE: Multiplanar, multiecho pulse sequences of the brain and surrounding structures were obtained without intravenous contrast. COMPARISON:  Head CT 11/15/2018 Brain MRI 08/05/2014 FINDINGS: BRAIN: There is no acute infarct, acute hemorrhage, hydrocephalus or extra-axial collection. The midline structures are normal. No midline shift or other mass effect. Old left cerebellar infarct, unchanged. Early confluent hyperintense T2-weighted signal of the periventricular and deep white matter, most commonly  due to chronic ischemic microangiopathy. The cerebral and cerebellar volume are age-appropriate. Susceptibility-sensitive sequences show no chronic microhemorrhage or superficial siderosis. VASCULAR: Major intracranial arterial and venous sinus flow voids are normal. SKULL AND UPPER CERVICAL SPINE: Calvarial bone marrow signal is normal. There is no skull base mass. Visualized upper cervical spine and soft tissues are normal. SINUSES/ORBITS: No fluid levels or advanced mucosal thickening. No mastoid or middle ear effusion. The orbits are normal. IMPRESSION: 1. No acute intracranial abnormality. 2. Unchanged appearance of old left cerebellar infarct and moderate white matter changes of chronic ischemic microangiopathy. Electronically Signed   By: Ulyses Jarred M.D.   On: 11/15/2018 22:21    Procedures Procedures (including critical care time)  Medications Ordered in ED Medications  LORazepam (ATIVAN) injection 0.5 mg (0.5 mg Intravenous Given 11/15/18 2058)     Initial Impression / Assessment and Plan / ED Course  I have reviewed the triage vital signs and the nursing notes.  Pertinent labs & imaging results that were available during my care of the patient were reviewed by me and considered in my medical decision making (see chart for details).     Patient is an 84 year old female with past medical history as detailed above who presents to the emergency department for evaluation of word finding difficulty and slurred speech.  Patient's last known normal was approximately 8 AM earlier this morning.  Word finding difficulty was first noticed by patient's daughter-in-law at approximately noon.  Patient also complains of difficulty with fine motor movements that she was unable to use her glucometer earlier in the afternoon and complains of a mild right-sided headache.  Secondary to patient's arrival complaint multiple imaging and laboratory studies were obtained.  Patient's MRI and CT head showed no  evidence of acute ischemia.  Patient's blood sugar somewhat elevated but her labs are otherwise reassuring.  Secondary to patient's past medical history as well as her presenting symptoms neurology was consulted.  After evaluating the patient in the emergency department, they feel that no  emergent intervention is warranted at this time.  They do not feel that patient will need admission and is appropriate for follow-up with neurology on on emergent basis.  While in the emergency department patient's symptoms resolved.  I communicated the plan to patient and their family that if her CTA was unremarkable they would be going home.  They are in agreement with this plan.  Patient's care was transitioned to oncoming provider and at that time patient was in no acute distress.  Please see their documentation for further information regarding patient's continued emergency department course.  The care of this patient was discussed with my attending physician Dr. Laverta Baltimore, who voices agreement with work-up and ED disposition.  Final Clinical Impressions(s) / ED Diagnoses   Final diagnoses:  Slurred speech  Word finding difficulty    ED Discharge Orders    None       Kazimir Hartnett, Chanda Busing, MD 11/15/18 2324    Margette Fast, MD 11/16/18 [phone]

## 2018-11-15 NOTE — ED Triage Notes (Signed)
Pt from home w/ a c/o expressive aphasia and left hand weakness that began today. Her last known well time was ~ 1200 today. The hand numbness has subsided. Complaints of a right sided headache and nausea. No vomiting. Unknown if pt had a LOC.

## 2018-11-16 DIAGNOSIS — R4789 Other speech disturbances: Secondary | ICD-10-CM

## 2018-11-16 DIAGNOSIS — R479 Unspecified speech disturbances: Secondary | ICD-10-CM | POA: Diagnosis not present

## 2018-11-16 MED ORDER — METOCLOPRAMIDE HCL 5 MG/ML IJ SOLN: 10.0000 mg | Freq: Once | INTRAMUSCULAR | Status: DC

## 2018-11-16 NOTE — Consult Note (Signed)
Neurology Consultation Reason for Consult: Change in speech Referring Physician: Rozier, R  CC: Changes speech  History is obtained from: Patient, family  HPI: [name] is a 84 y.o. female with a history of previous stroke as well as atrial fibrillation on anticoagulation with Coumadin who presents with changes in speech as well as right frontal headache that started around noon today.  She has a history of similar episodes, with the worst being back in 2015 at which time she was admitted to the hospital and worked up as TIA.  She is seen by Dr. Erlinda Hong in clinic who felt that the episode was likely migraine.  She does have a history of migraines and states that currently she gets a spell like this about once a month, but that this time it was much longer than typical.  Due to the prolonged nature, she presented to the emergency department for further evaluation.  She denies numbness or weakness or visual changes.   LKW: Noon tpa given?: no, mild symptoms, anticoagulation    ROS: A 14 point ROS was performed and is negative except as noted in the HPI.   Past Medical History:  Diagnosis Date  . Anginal pain ([place])    "saw doctor-think it was esophagus spasm"  . Arthritis   . Atrial flutter ([place]) 11/02/2013  . Cataract    left eye  . Coronary artery disease   . Diabetes mellitus without complication ([place])    type 2  . Essential hypertension, benign 11/02/2013  . H/O jaundice    as child  . Headache 4193   complicated migraine x2   . Hearing trouble   . Heart disease   . Hepatitis    hx of hepatitis A as child  . High cholesterol    under control  . Long term (current) use of anticoagulants 11/02/2013   Pradaxa   . Measles    as child  . Mouth dryness   . Mumps    as teenager  . Numbness and tingling    lips  . Osteoporosis    "unsure-maybe in knees"  . Psoriasis   . Psoriatic arthritis ([place])   . Stroke ([place]) 2012  . Vertigo    being monitored, occasional      Family History  Problem Relation Age of Onset  . Heart failure Mother   . Neuromuscular disorder Father      Social History:  reports that she has never smoked. She has never used smokeless tobacco. She reports that she does not drink alcohol or use drugs.   Exam: Current vital signs: BP (!) 135/51 (BP Location: Right Arm)   Pulse 79   Resp 18   Ht 5\' 4"  (1.626 m)   Wt 82.1 kg   SpO2 98%   BMI 31.07 kg/m  Vital signs in last 24 hours: Pulse Rate:  [79-89] 79 (11/18 0056) Resp:  [14-21] 18 (11/18 0056) BP: (135-174)/(51-106) 135/51 (11/18 0056) SpO2:  [97 %-100 %] 98 % (11/18 0056) Weight:  [82.1 kg] 82.1 kg (11/17 1802)   Physical Exam  Constitutional: Appears well-developed and well-nourished.  Psych: Affect appropriate to situation Eyes: No scleral injection HENT: No OP obstrucion Head: Normocephalic.  Cardiovascular: Normal rate and regular rhythm.  Respiratory: Effort normal, non-labored breathing GI: Soft.  No distension. There is no tenderness.  Skin: WDI  Neuro: Mental Status: Patient is awake, alert, oriented to person, place, month, year, and situation. Patient is able to give a clear and  coherent history. No signs of neglect, she has mild increased latency of speech with intact repetition Cranial Nerves: II: Visual Fields are full. Pupils are equal, round, and reactive to light.   III,IV, VI: EOMI without ptosis or diploplia.  V: Facial sensation is symmetric to temperature VII: Facial movement is symmetric.  VIII: hearing is intact to voice X: Uvula elevates symmetrically XI: Shoulder shrug is symmetric. XII: tongue is midline without atrophy or fasciculations.  Motor: Tone is normal. Bulk is normal. 5/5 strength was present in all four extremities.  Sensory: Sensation is symmetric to light touch and temperature in the arms and legs. Cerebellar: FNF without ataxia   I have reviewed labs in epic and the results pertinent to this  consultation are: CMP-elevated glucose  I have reviewed the images obtained: MRI brain-no acute changes  Impression: 84 year old female with mild difficulty with speech in the setting of right frontal headache and a history of migraines.  With the recurrent episodes I think that a vascular cause is much less likely, though given that she has not had any vascular imaging since 2015, I do think it would be reasonable to rule this out as a possible etiology.  Recommendations: 1) CT angiogram head neck 2) Reglan x1 for headache 3) IF negative, no further workup needed.    Roland Rack, MD [facility] [phone]  If 7pm- 7am, please page neurology on call as listed in [place].

## 2018-11-16 NOTE — ED Provider Notes (Signed)
Assumed care from Dr. Lavell Islam at shift change.  See prior notes for full H&P.  Briefly, 84 y.o. F here with concern for stroke-like symptoms, specifically word finding difficulties and some issues with her fine motor skills.  Thus far, labs and CT reassuring.  MRI negative as well.  Patient's symptoms have resolved here in the ED.  Plan:  CTA head/neck pending.  If negative, patient can be discharged home to follow-up with OP neurology.  Results for orders placed or performed during the hospital encounter of 11/15/18  Ethanol  Result Value Ref Range   Alcohol, Ethyl (B) <10 <10 mg/dL  Protime-INR  Result Value Ref Range   Prothrombin Time 22.2 (H) 11.4 - 15.2 seconds   INR 1.98   APTT  Result Value Ref Range   aPTT 67 (H) 24 - 36 seconds  CBC  Result Value Ref Range   WBC 6.7 4.0 - 10.5 K/uL   RBC 4.46 3.87 - 5.11 MIL/uL   Hemoglobin 12.6 12.0 - 15.0 g/dL   HCT 40.0 36.0 - 46.0 %   MCV 89.7 80.0 - 100.0 fL   MCH 28.3 26.0 - 34.0 pg   MCHC 31.5 30.0 - 36.0 g/dL   RDW 12.4 11.5 - 15.5 %   Platelets 165 150 - 400 K/uL   nRBC 0.0 0.0 - 0.2 %  Differential  Result Value Ref Range   Neutrophils Relative % 49 %   Neutro Abs 3.2 1.7 - 7.7 K/uL   Lymphocytes Relative 39 %   Lymphs Abs 2.6 0.7 - 4.0 K/uL   Monocytes Relative 9 %   Monocytes Absolute 0.6 0.1 - 1.0 K/uL   Eosinophils Relative 3 %   Eosinophils Absolute 0.2 0.0 - 0.5 K/uL   Basophils Relative 0 %   Basophils Absolute 0.0 0.0 - 0.1 K/uL   Immature Granulocytes 0 %   Abs Immature Granulocytes 0.01 0.00 - 0.07 K/uL  Comprehensive metabolic panel  Result Value Ref Range   Sodium 138 135 - 145 mmol/L   Potassium 4.0 3.5 - 5.1 mmol/L   Chloride 106 98 - 111 mmol/L   CO2 26 22 - 32 mmol/L   Glucose, Bld 224 (H) 70 - 99 mg/dL   BUN 18 8 - 23 mg/dL   Creatinine, Ser 0.72 0.44 - 1.00 mg/dL   Calcium 9.2 8.9 - 10.3 mg/dL   Total Protein 6.5 6.5 - 8.1 g/dL   Albumin 3.7 3.5 - 5.0 g/dL   AST 20 15 - 41 U/L   ALT 16 0 - 44  U/L   Alkaline Phosphatase 72 38 - 126 U/L   Total Bilirubin 0.3 0.3 - 1.2 mg/dL   GFR calc non Af Amer >60 >60 mL/min   GFR calc Af Amer >60 >60 mL/min   Anion gap 6 5 - 15  Urine rapid drug screen (hosp performed)  Result Value Ref Range   Opiates NONE DETECTED NONE DETECTED   Cocaine NONE DETECTED NONE DETECTED   Benzodiazepines NONE DETECTED NONE DETECTED   Amphetamines NONE DETECTED NONE DETECTED   Tetrahydrocannabinol NONE DETECTED NONE DETECTED   Barbiturates NONE DETECTED NONE DETECTED  Urinalysis, Routine w reflex microscopic  Result Value Ref Range   Color, Urine YELLOW YELLOW   APPearance CLEAR CLEAR   Specific Gravity, Urine 1.017 1.005 - 1.030   pH 5.0 5.0 - 8.0   Glucose, UA 50 (A) NEGATIVE mg/dL   Hgb urine dipstick NEGATIVE NEGATIVE   Bilirubin Urine NEGATIVE NEGATIVE  Ketones, ur NEGATIVE NEGATIVE mg/dL   Protein, ur NEGATIVE NEGATIVE mg/dL   Nitrite NEGATIVE NEGATIVE   Leukocytes, UA NEGATIVE NEGATIVE  CBG monitoring, ED  Result Value Ref Range   Glucose-Capillary 191 (H) 70 - 99 mg/dL   Comment 1 Notify RN   I-Stat Chem 8, ED  Result Value Ref Range   Sodium 141 135 - 145 mmol/L   Potassium 4.2 3.5 - 5.1 mmol/L   Chloride 106 98 - 111 mmol/L   BUN 22 8 - 23 mg/dL   Creatinine, Ser 0.70 0.44 - 1.00 mg/dL   Glucose, Bld 214 (H) 70 - 99 mg/dL   Calcium, Ion 1.13 (L) 1.15 - 1.40 mmol/L   TCO2 26 22 - 32 mmol/L   Hemoglobin 12.2 12.0 - 15.0 g/dL   HCT 36.0 36.0 - 46.0 %  I-stat troponin, ED  Result Value Ref Range   Troponin i, poc 0.00 0.00 - 0.08 ng/mL   Comment 3           Ct Angio Head W Or Wo Contrast  Result Date: 11/16/2018 CLINICAL DATA:  Word-finding difficulties EXAM: CT ANGIOGRAPHY HEAD AND NECK TECHNIQUE: Multidetector CT imaging of the head and neck was performed using the standard protocol during bolus administration of intravenous contrast. Multiplanar CT image reconstructions and MIPs were obtained to evaluate the vascular anatomy.  Carotid stenosis measurements (when applicable) are obtained utilizing NASCET criteria, using the distal internal carotid diameter as the denominator. CONTRAST:  68mL ISOVUE-370 IOPAMIDOL (ISOVUE-370) INJECTION 76% COMPARISON:  Head CT 11/15/2018 FINDINGS: CTA NECK FINDINGS SKELETON: There is no bony spinal canal stenosis. No lytic or blastic lesion. OTHER NECK: Normal pharynx, larynx and major salivary glands. No cervical lymphadenopathy. Unremarkable thyroid gland. UPPER CHEST: No pneumothorax or pleural effusion. No nodules or masses. AORTIC ARCH: There is mild calcific atherosclerosis of the aortic arch. There is no aneurysm, dissection or hemodynamically significant stenosis of the visualized ascending aorta and aortic arch. Conventional 3 vessel aortic branching pattern. The visualized proximal subclavian arteries are widely patent. RIGHT CAROTID SYSTEM: --Common carotid artery: Widely patent origin without common carotid artery dissection or aneurysm. --Internal carotid artery: Normal without aneurysm, dissection or stenosis. --External carotid artery: No acute abnormality. LEFT CAROTID SYSTEM: --Common carotid artery: Widely patent origin without common carotid artery dissection or aneurysm. --Internal carotid artery: Normal without aneurysm, dissection or stenosis. --External carotid artery: No acute abnormality. VERTEBRAL ARTERIES: Codominant configuration. Both origins are normal. No dissection, occlusion or flow-limiting stenosis to the vertebrobasilar confluence. CTA HEAD FINDINGS ANTERIOR CIRCULATION: --Intracranial internal carotid arteries: Atherosclerotic calcification of the internal carotid arteries at the skull base without hemodynamically significant stenosis. --Anterior cerebral arteries: Normal. Both A1 segments are present. Patent anterior communicating artery. --Middle cerebral arteries: Normal. --Posterior communicating arteries: Present on the left, absent on the right. POSTERIOR  CIRCULATION: --Basilar artery: Normal. --Posterior cerebral arteries: Normal. --Superior cerebellar arteries: Normal. --Inferior cerebellar arteries: Normal posterior inferior cerebellar arteries. AICA is visualized only on the right. VENOUS SINUSES: As permitted by contrast timing, patent. ANATOMIC VARIANTS: Fetal origin of the left posterior cerebral artery. DELAYED PHASE: No parenchymal contrast enhancement. Review of the MIP images confirms the above findings. IMPRESSION: 1. No emergent large vessel occlusion or high-grade stenosis of the intracranial arteries. 2. No dissection or hemodynamically significant stenosis of the major cervical arteries. Electronically Signed   By: Ulyses Jarred M.D.   On: 11/16/2018 00:20   Ct Head Wo Contrast  Result Date: 11/15/2018 CLINICAL DATA:  Expressive aphasia.  Left hand weakness. EXAM: CT HEAD WITHOUT CONTRAST TECHNIQUE: Contiguous axial images were obtained from the base of the skull through the vertex without intravenous contrast. COMPARISON:  MRI 08/05/2014.  CT 08/04/2014. FINDINGS: Brain: Chronic microvascular disease throughout the deep white matter. Mild cerebral atrophy. No acute intracranial abnormality. Specifically, no hemorrhage, hydrocephalus, mass lesion, acute infarction, or significant intracranial injury. Vascular: No hyperdense vessel or unexpected calcification. Skull: No acute calvarial abnormality. Sinuses/Orbits: Visualized paranasal sinuses and mastoids clear. Orbital soft tissues unremarkable. Other: None IMPRESSION: No acute intracranial abnormality. Atrophy, chronic microvascular disease. Electronically Signed   By: Rolm Baptise M.D.   On: 11/15/2018 19:23   Ct Angio Neck W Or Wo Contrast  Result Date: 11/16/2018 CLINICAL DATA:  Word-finding difficulties EXAM: CT ANGIOGRAPHY HEAD AND NECK TECHNIQUE: Multidetector CT imaging of the head and neck was performed using the standard protocol during bolus administration of intravenous contrast.  Multiplanar CT image reconstructions and MIPs were obtained to evaluate the vascular anatomy. Carotid stenosis measurements (when applicable) are obtained utilizing NASCET criteria, using the distal internal carotid diameter as the denominator. CONTRAST:  29mL ISOVUE-370 IOPAMIDOL (ISOVUE-370) INJECTION 76% COMPARISON:  Head CT 11/15/2018 FINDINGS: CTA NECK FINDINGS SKELETON: There is no bony spinal canal stenosis. No lytic or blastic lesion. OTHER NECK: Normal pharynx, larynx and major salivary glands. No cervical lymphadenopathy. Unremarkable thyroid gland. UPPER CHEST: No pneumothorax or pleural effusion. No nodules or masses. AORTIC ARCH: There is mild calcific atherosclerosis of the aortic arch. There is no aneurysm, dissection or hemodynamically significant stenosis of the visualized ascending aorta and aortic arch. Conventional 3 vessel aortic branching pattern. The visualized proximal subclavian arteries are widely patent. RIGHT CAROTID SYSTEM: --Common carotid artery: Widely patent origin without common carotid artery dissection or aneurysm. --Internal carotid artery: Normal without aneurysm, dissection or stenosis. --External carotid artery: No acute abnormality. LEFT CAROTID SYSTEM: --Common carotid artery: Widely patent origin without common carotid artery dissection or aneurysm. --Internal carotid artery: Normal without aneurysm, dissection or stenosis. --External carotid artery: No acute abnormality. VERTEBRAL ARTERIES: Codominant configuration. Both origins are normal. No dissection, occlusion or flow-limiting stenosis to the vertebrobasilar confluence. CTA HEAD FINDINGS ANTERIOR CIRCULATION: --Intracranial internal carotid arteries: Atherosclerotic calcification of the internal carotid arteries at the skull base without hemodynamically significant stenosis. --Anterior cerebral arteries: Normal. Both A1 segments are present. Patent anterior communicating artery. --Middle cerebral arteries: Normal.  --Posterior communicating arteries: Present on the left, absent on the right. POSTERIOR CIRCULATION: --Basilar artery: Normal. --Posterior cerebral arteries: Normal. --Superior cerebellar arteries: Normal. --Inferior cerebellar arteries: Normal posterior inferior cerebellar arteries. AICA is visualized only on the right. VENOUS SINUSES: As permitted by contrast timing, patent. ANATOMIC VARIANTS: Fetal origin of the left posterior cerebral artery. DELAYED PHASE: No parenchymal contrast enhancement. Review of the MIP images confirms the above findings. IMPRESSION: 1. No emergent large vessel occlusion or high-grade stenosis of the intracranial arteries. 2. No dissection or hemodynamically significant stenosis of the major cervical arteries. Electronically Signed   By: Ulyses Jarred M.D.   On: 11/16/2018 00:20   Mr Brain Wo Contrast  Result Date: 11/15/2018 CLINICAL DATA:  Expressive aphasia and left hand weakness. EXAM: MRI HEAD WITHOUT CONTRAST TECHNIQUE: Multiplanar, multiecho pulse sequences of the brain and surrounding structures were obtained without intravenous contrast. COMPARISON:  Head CT 11/15/2018 Brain MRI 08/05/2014 FINDINGS: BRAIN: There is no acute infarct, acute hemorrhage, hydrocephalus or extra-axial collection. The midline structures are normal. No midline shift or other mass effect. Old left cerebellar infarct, unchanged. Early confluent  hyperintense T2-weighted signal of the periventricular and deep white matter, most commonly due to chronic ischemic microangiopathy. The cerebral and cerebellar volume are age-appropriate. Susceptibility-sensitive sequences show no chronic microhemorrhage or superficial siderosis. VASCULAR: Major intracranial arterial and venous sinus flow voids are normal. SKULL AND UPPER CERVICAL SPINE: Calvarial bone marrow signal is normal. There is no skull base mass. Visualized upper cervical spine and soft tissues are normal. SINUSES/ORBITS: No fluid levels or advanced  mucosal thickening. No mastoid or middle ear effusion. The orbits are normal. IMPRESSION: 1. No acute intracranial abnormality. 2. Unchanged appearance of old left cerebellar infarct and moderate white matter changes of chronic ischemic microangiopathy. Electronically Signed   By: Ulyses Jarred M.D.   On: 11/15/2018 22:21   CTA head/neck negative.  Patient feels well and is ready to go home.  Have given copies of labs/imaging studies from today's visit to family, they will follow-up with OP neurology.  Return precautions given for new or worsening symptoms.   Larene Pickett, PA-C 11/16/18 Francene Castle    Ezequiel Essex, MD 11/16/18 [phone]

## 2018-11-19 NOTE — Progress Notes (Signed)
NEUROLOGY CONSULTATION NOTE  CHIA DAHLMAN MRN: [id number] DOB: 20-Sep-1934  Referring provider: Alona Bene, MD (ED referral) Primary care provider: [name], MD  Reason for consult: Strokelike symptoms  HISTORY OF PRESENT ILLNESS: [name] is an 84 year old left-handed female with atrial flutter, coronary artery disease, type 2 diabetes mellitus, hypertension, high cholesterol, and history of stroke who presents for stroke-like symptoms.  History supplemented by ED note.  She is accompanied by her daughter-in-law who supplements history.  She presented to the ED on 11/15/2018 for strokelike symptoms.  She had trouble saying words that she was thinking (not word-finding difficulty) and slurred speech.  She was also confused, unable to figure out how to work the washing machine or use her glucometer.  She also noted mild right-sided headache.  She did not have any other focal or lateralizing symptoms such as unilateral numbness or weakness.  Symptoms lasted several hours.  EKG showed sinus rhythm.  CT of the head was personally reviewed and revealed no acute abnormalities.  MRI of the brain was personally reviewed and demonstrated known old left cerebellar infarct as well as other chronic small vessel ischemic changes but no acute stroke.  CTA of the head and neck was personally reviewed and showed no emergent large vessel occlusion or high-grade stenosis of the intracranial and extracranial arteries.  Other than an elevated serum glucose of 214, labs such as CBC, CMP, UA, urine rapid drug screen, and ethanol were negative.  Since then, she has been more weak and sleepy.  She can barely get up out of her chair.  She is not as ambulatory.  She has prior history of stroke.  In 2012, she had a cerebellar stroke secondary to atrial flutter.  In 2015 she had episode of word-finding difficulty and right sided head numbness and confusion (unable to enter her computer password).  MRI of brain  showed no acute stroke.  She was diagnosed with complicated migraine.  She does have remote history of frequent common migraines in her 60s and 30s.    She takes Pradaxa for atrial flutter.  She takes Crestor 10mg .  PAST MEDICAL HISTORY: Past Medical History:  Diagnosis Date  . Anginal pain (HCC)    "saw doctor-think it was esophagus spasm"  . Arthritis   . Atrial flutter (HCC) 11/02/2013  . Cataract    left eye  . Coronary artery disease   . Diabetes mellitus without complication (HCC)    type 2  . Essential hypertension, benign 11/02/2013  . H/O jaundice    as child  . Headache 2015   complicated migraine x2   . Hearing trouble   . Heart disease   . Hepatitis    hx of hepatitis A as child  . High cholesterol    under control  . Long term (current) use of anticoagulants 11/02/2013   Pradaxa   . Measles    as child  . Mouth dryness   . Mumps    as teenager  . Numbness and tingling    lips  . Osteoporosis    "unsure-maybe in knees"  . Psoriasis   . Psoriatic arthritis (HCC)   . Stroke (HCC) 2012  . Vertigo    being monitored, occasional    PAST SURGICAL HISTORY: Past Surgical History:  Procedure Laterality Date  . APPENDECTOMY  as child  . CARDIAC CATHETERIZATION  ~2006   clear  . CORONARY ARTERY BYPASS GRAFT  02/16/2005  . LUMBAR DISC SURGERY  09/2004  L1-L5  . TONSILLECTOMY  as child   with adenoids  . TOTAL KNEE ARTHROPLASTY Left 09/11/2015   Procedure: LEFT TOTAL KNEE ARTHROPLASTY;  Surgeon: Ollen Gross, MD;  Location: [facility];  Service: Orthopedics;  Laterality: Left;    MEDICATIONS: Current Outpatient Medications on File Prior to Visit  Medication Sig Dispense Refill  . acetaminophen (TYLENOL) 500 MG tablet Take 500 mg by mouth every 6 (six) hours as needed for mild pain.    . calcium elemental as carbonate (TUMS ULTRA 1000) 400 MG chewable tablet Chew 1,000 mg by mouth every evening.    . carvedilol (COREG) 25 MG tablet TAKE (1) TABLET TWICE A DAY  WITH FOOD. (Patient taking differently: 25 mg 2 (two) times daily with a meal. ) 180 tablet 3  . cholecalciferol (VITAMIN D) 1000 units tablet Take 2,000 Units by mouth daily.    . clobetasol cream (TEMOVATE) 0.05 % Apply 1 application topically 2 (two) times daily as needed (skin irritation).    . clonazePAM (KLONOPIN) 0.5 MG tablet Take 0.5 mg by mouth at bedtime as needed (for sleep).     . cyclobenzaprine (FLEXERIL) 10 MG tablet Take 10 mg by mouth 3 (three) times daily as needed for muscle spasms.    Marland Kitchen docusate sodium (COLACE) 100 MG capsule Take 100 mg by mouth daily as needed for mild constipation.    Marland Kitchen LANTUS SOLOSTAR 100 UNIT/ML Solostar Pen Inject 56 Units into the skin every morning.   4  . Loratadine (CLARITIN PO) Take 1 tablet by mouth daily.    . Melatonin 5 MG TABS Take 5 mg by mouth every evening.    . metFORMIN (GLUCOPHAGE) 500 MG tablet Take 1,000 mg by mouth 2 (two) times daily.    . nitroGLYCERIN (NITROSTAT) 0.4 MG SL tablet Place 0.4 mg under the tongue every 5 (five) minutes x 3 doses as needed for chest pain.    Marland Kitchen nystatin-triamcinolone ointment (MYCOLOG) Apply 1 application topically as directed.   0  . polyethylene glycol (MIRALAX / GLYCOLAX) packet Take 17 g by mouth daily.    Marland Kitchen PRADAXA 150 MG CAPS Take 150 mg by mouth 2 (two) times daily.     . Probiotic Product (ALIGN PO) Take 1 capsule by mouth daily.    . ramipril (ALTACE) 5 MG capsule Take 1 capsule (5 mg total) by mouth 2 (two) times daily. 60 capsule 3  . rosuvastatin (CRESTOR) 10 MG tablet Take 10 mg by mouth at bedtime.     . senna (SENOKOT) 8.6 MG tablet Take 1 tablet by mouth at bedtime as needed for constipation.    . traZODone (DESYREL) 50 MG tablet Take 25 mg by mouth at bedtime.  5  . vitamin B-12 (CYANOCOBALAMIN) 250 MCG tablet Take 250 mcg by mouth daily.     No current facility-administered medications on file prior to visit.     ALLERGIES: Allergies  Allergen Reactions  . Codeine Nausea And  Vomiting  . Penicillins Swelling and Other (See Comments)    Swelling of tongue  . Atenolol Other (See Comments)  . Atorvastatin     Muscle soreness  . Metoprolol Other (See Comments)  . Zetia [Ezetimibe] Other (See Comments)    FAMILY HISTORY: Family History  Problem Relation Age of Onset  . Heart failure Mother   . Neuromuscular disorder Father     SOCIAL HISTORY: Social History   Socioeconomic History  . Marital status: Married    Spouse name: Not on file  .  Number of children: 3  . Years of education: BS  . Highest education level: Not on file  Occupational History  . Not on file  Social Needs  . Financial resource strain: Not on file  . Food insecurity:    Worry: Not on file    Inability: Not on file  . Transportation needs:    Medical: Not on file    Non-medical: Not on file  Tobacco Use  . Smoking status: Never Smoker  . Smokeless tobacco: Never Used  Substance and Sexual Activity  . Alcohol use: No    Alcohol/week: 0.0 standard drinks  . Drug use: No  . Sexual activity: Not on file  Lifestyle  . Physical activity:    Days per week: Not on file    Minutes per session: Not on file  . Stress: Not on file  Relationships  . Social connections:    Talks on phone: Not on file    Gets together: Not on file    Attends religious service: Not on file    Active member of club or organization: Not on file    Attends meetings of clubs or organizations: Not on file    Relationship status: Not on file  . Intimate partner violence:    Fear of current or ex partner: Not on file    Emotionally abused: Not on file    Physically abused: Not on file    Forced sexual activity: Not on file  Other Topics Concern  . Not on file  Social History Narrative   Patient is married with 3 children.   Patient is left handed.   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   Patient drinks 3 cups daily.    REVIEW OF SYSTEMS: Constitutional: No fevers, chills, or  sweats, no generalized fatigue, change in appetite Eyes: No visual changes, double vision, eye pain Ear, nose and throat: No hearing loss, ear pain, nasal congestion, sore throat Cardiovascular: No chest pain, palpitations Respiratory:  No shortness of breath at rest or with exertion, wheezes GastrointestinaI: No nausea, vomiting, diarrhea, abdominal pain, fecal incontinence Genitourinary:  No dysuria, urinary retention or frequency Musculoskeletal:  No neck pain, back pain Integumentary: No rash, pruritus, skin lesions Neurological: as above Psychiatric: No depression, insomnia, anxiety Endocrine: No palpitations, fatigue, diaphoresis, mood swings, change in appetite, change in weight, increased thirst Hematologic/Lymphatic:  No purpura, petechiae. Allergic/Immunologic: no itchy/runny eyes, nasal congestion, recent allergic reactions, rashes  PHYSICAL EXAM: Blood pressure 140/60, pulse 71, height 5\' 2"  (1.575 m), weight 174 lb (78.9 kg), SpO2 96 %. General: No acute distress.  Patient appears well-groomed.  Head:  Normocephalic/atraumatic Eyes:  fundi examined but not visualized Neck: supple, no paraspinal tenderness, full range of motion Back: No paraspinal tenderness Heart: regular rate and rhythm Lungs: Clear to auscultation bilaterally. Vascular: No carotid bruits. Neurological Exam: Mental status: alert and oriented to person, place, and time, recent and remote memory intact, fund of knowledge intact, attention and concentration intact, speech fluent and not dysarthric, language intact. Cranial nerves: CN I: not tested CN II: pupils equal, round and reactive to light, visual fields intact CN III, IV, VI:  full range of motion, no nystagmus, no ptosis CN V: facial sensation intact CN VII: upper and lower face symmetric CN VIII: hearing intact CN IX, X: gag intact, uvula midline CN XI: sternocleidomastoid and trapezius muscles intact CN XII: tongue midline Bulk & Tone:  normal, no fasciculations. Motor:  5-/5 in hip flexion,  otherwise 5 /5 throughout  Sensation:  temperature and vibration sensation intact. Deep Tendon Reflexes:  1+ throughout except absent in the ankles, toes downgoing.  Finger to nose testing:  Without dysmetria.  Gait: Slightly wide-based gait with short stride.  Unsteady.  Uses a walker.  Romberg with sway.  IMPRESSION: 1.  Strokelike event.  Semiology remarkably similar to prior event in 2015 which was diagnosed as a complicated migraine.  Given the negative MRI and associated headache (with remote history of severe common migraines), this was also likely a complicated migraine.  But given her comorbidities, a transient ischemic attack cannot be ruled out. 2.  Atrial flutter 3.  Hypertension 4.  Type 2 diabetes mellitus 5.  Hyperlipidemia 6.  Coronary artery disease  PLAN: 1.  She should follow-up with her cardiologist.  Her cardiologist may consider switching Pradaxa to another anticoagulant.  However, I do not feel strongly that this is absolutely necessary. 2.  Continue statin therapy as per PCP.  LDL goal should be less than 70. 3.  Optimize blood pressure and glycemic control as per PCP 4.  Follow-up in 3 to 4 months  Thank you for allowing me to take part in the care of this patient.  Shon Millet, DO  CC:  [name], MD

## 2018-11-20 ENCOUNTER — Ambulatory Visit (INDEPENDENT_AMBULATORY_CARE_PROVIDER_SITE_OTHER): Payer: Medicare Other | Admitting: Neurology

## 2018-11-20 ENCOUNTER — Encounter

## 2018-11-20 ENCOUNTER — Encounter: Payer: Self-pay | Admitting: Neurology

## 2018-11-20 VITALS — BP 140/60 | HR 71 | Ht 62.0 in | Wt 174.0 lb

## 2018-11-20 DIAGNOSIS — I10 Essential (primary) hypertension: Secondary | ICD-10-CM | POA: Diagnosis not present

## 2018-11-20 DIAGNOSIS — I484 Atypical atrial flutter: Secondary | ICD-10-CM | POA: Diagnosis not present

## 2018-11-20 DIAGNOSIS — E119 Type 2 diabetes mellitus without complications: Secondary | ICD-10-CM

## 2018-11-20 DIAGNOSIS — E785 Hyperlipidemia, unspecified: Secondary | ICD-10-CM | POA: Diagnosis not present

## 2018-11-20 DIAGNOSIS — G43109 Migraine with aura, not intractable, without status migrainosus: Secondary | ICD-10-CM

## 2018-11-20 DIAGNOSIS — I25709 Atherosclerosis of coronary artery bypass graft(s), unspecified, with unspecified angina pectoris: Secondary | ICD-10-CM

## 2018-11-20 DIAGNOSIS — G459 Transient cerebral ischemic attack, unspecified: Secondary | ICD-10-CM | POA: Diagnosis not present

## 2018-11-20 NOTE — Patient Instructions (Signed)
Transient ischemic attack verus complicated migraine 1.  Dr. Tamala Julian may consider switching Pradaxa to another blood thinner, but it is not absolutely necessary 2.  Continue Crestor, blood pressure meds and diabetes meds 3.  Follow up in 3 to 4 months.

## 2018-11-25 ENCOUNTER — Telehealth: Payer: Self-pay | Admitting: Interventional Cardiology

## 2018-11-25 NOTE — Telephone Encounter (Signed)
New Message:   Patient daughter in law calling, because she needs a sooner appt due to a 2 weeks follow. His is booked up and they do not want to see a PA. Please call daughter in-law [phone].

## 2018-11-25 NOTE — Telephone Encounter (Signed)
Spoke with DIL, DPR on file.  DIL wanting to schedule pt with Dr. Tamala Julian to f/u after being in the hospital recently.  Pt recently seen Dr. Tomi Likens who mentioned in his note that Cardiology may want to switch Pradaxa.  DIL states pt was having fluttering feelings recently when she went to see her so unsure if Aflutter is occurring more often.  Advised Dr. Tamala Julian does not have any appts and will be out of the office for most of December.  DIL reluctant but finally agreed for pt to see APP.  Scheduled pt to see Richardson Dopp, PA-C 12/10.  DIL appreciative for call.

## 2018-11-30 ENCOUNTER — Telehealth: Payer: Self-pay | Admitting: Neurology

## 2018-11-30 DIAGNOSIS — G459 Transient cerebral ischemic attack, unspecified: Secondary | ICD-10-CM

## 2018-11-30 NOTE — Telephone Encounter (Signed)
Patient's daughter is calling in wanting to speak with you inregards to the pt having some vertigo. Which she has had before but she has gotten worse within the last couple of days. Please call her back at [phone]. Thanks!

## 2018-11-30 NOTE — Telephone Encounter (Addendum)
Called and spoke with [facility]. Pt's vertigo has increased, but when questioned, [name] states it's more of a fullness in her head. She says the Pt has had to use her walker.  I spoke with Dr. Tomi Likens, as Pt has had these same symptoms before of a complicated migraine, he recommended vestibular rehab and P/T for strength in lower extremities.  Called to [facility], went to her VM, LMOVM advising

## 2018-12-08 ENCOUNTER — Encounter: Payer: Self-pay | Admitting: Physician Assistant

## 2018-12-08 ENCOUNTER — Ambulatory Visit (INDEPENDENT_AMBULATORY_CARE_PROVIDER_SITE_OTHER): Payer: Medicare Other | Admitting: Physician Assistant

## 2018-12-08 VITALS — BP 132/70 | HR 79 | Ht 62.0 in | Wt 174.0 lb

## 2018-12-08 DIAGNOSIS — I2510 Atherosclerotic heart disease of native coronary artery without angina pectoris: Secondary | ICD-10-CM | POA: Diagnosis not present

## 2018-12-08 DIAGNOSIS — I10 Essential (primary) hypertension: Secondary | ICD-10-CM

## 2018-12-08 DIAGNOSIS — I480 Paroxysmal atrial fibrillation: Secondary | ICD-10-CM | POA: Diagnosis not present

## 2018-12-08 NOTE — Progress Notes (Signed)
Cardiology Office Note:    Date:  12/08/2018   ID:  [name], DOB 1934/03/02, MRN [id number]  PCP:  [name], MD  Cardiologist:  Lesleigh Noe, MD   Electrophysiologist:  None   Referring MD: [name], MD   Chief Complaint  Patient presents with  . Hospitalization Follow-up    recent visit to ED with ?TIA symptoms    History of Present Illness:    [name] is a 84 y.o. female with coronary artery disease status post CABG in 2006, paroxysmal atrial fibrillation on chronic anticoagulation with dabigatran, rheumatoid arthritis, diabetes, hypertension, prior stroke.  [name] was last seen by Dr. Katrinka Blazing in September 2019.  [name] recently went to the emergency room in November 2019 with speech difficulty and transient confusion.  CT and MRI were both unremarkable for acute findings.  Her symptoms eventually resolved.  [name] did follow-up with neurology.  Dr. Everlena Cooper felt that her symptoms could likely be secondary to a complicated migraine.  However, a transient ischemic attack could not be ruled out.  It was recommended that [name] follow-up with cardiology to decide whether or not [name] should switch her anticoagulant.    [name] returns for follow-up.  [name] is here with her daughter.  [name] has not had any recurrent neurologic symptoms.  [name] denies chest discomfort or significant shortness of breath.  [name] denies orthopnea, paroxysmal nocturnal dyspnea or syncope.  [name] does have vertiginous symptoms from time to time.  [name] also notes dependent lower extremity swelling.  Prior CV studies:   The following studies were reviewed today:  Event Monitor 01/28/17  Normal sinus rhythm  Rare atrial runs up to 150 bpm, brief in duration.  No atrial fibrillation No atrial fibrillation. "Fluttering " due to brief atrial runs.  Myoview 07/25/15 Myocardial perfusion is normal. The study is normal. This is a low risk study. Overall left ventricular systolic function was normal. Nuclear  stress EF: 72%. The left ventricular ejection fraction is normal (55-65%).   Carotid US 08/05/14 - The vertebral arteries appear patent with antegrade flow. - Findings consistent with 1-39 percent stenosis involving the right internal carotid artery and the left internal carotid artery.  Echo 08/05/14 Mild LVH, EF 55-60, no RWMA, MAC, mild LAE, trivial eff  Cardiac Catheterization 03/25/06 EF > 60 LM dist 60-70 LAD > 90 OM1 100 RCA ok S-D1 patent S-OM patent L-LAD patent   Past Medical History:  Diagnosis Date  . Anginal pain (HCC)    "saw doctor-think it was esophagus spasm"  . Arthritis   . Atrial flutter (HCC) 11/02/2013  . Cataract    left eye  . Coronary artery disease   . Diabetes mellitus without complication (HCC)    type 2  . Essential hypertension, benign 11/02/2013  . H/O jaundice    as child  . Headache 2015   complicated migraine x2   . Hearing trouble   . Heart disease   . Hepatitis    hx of hepatitis A as child  . High cholesterol    under control  . Long term (current) use of anticoagulants 11/02/2013   Pradaxa   . Measles    as child  . Mouth dryness   . Mumps    as teenager  . Numbness and tingling    lips  . Osteoporosis    "unsure-maybe in knees"  . Psoriasis   . Psoriatic arthritis (HCC)   . Stroke (HCC) 2012  . Vertigo  being monitored, occasional   Surgical Hx: The patient  has a past surgical history that includes Lumbar disc surgery (09/2004); Coronary artery bypass graft (02/16/2005); Tonsillectomy (as child); Appendectomy (as child); Cardiac catheterization (~2006); and Total knee arthroplasty (Left, 09/11/2015).   Current Medications: Current Meds  Medication Sig  . acetaminophen (TYLENOL) 500 MG tablet Take 500 mg by mouth every 6 (six) hours as needed for mild pain.  . calcium elemental as carbonate (TUMS ULTRA 1000) 400 MG chewable tablet Chew 1,000 mg by mouth every evening.  . carvedilol (COREG) 25 MG tablet TAKE (1)  TABLET TWICE A DAY WITH FOOD.  . cholecalciferol (VITAMIN D) 1000 units tablet Take 2,000 Units by mouth daily.  . clobetasol cream (TEMOVATE) 0.05 % Apply 1 application topically 2 (two) times daily as needed (skin irritation).  . clonazePAM (KLONOPIN) 0.5 MG tablet Take 0.5 mg by mouth at bedtime as needed (for sleep).   . cyclobenzaprine (FLEXERIL) 10 MG tablet Take 10 mg by mouth 3 (three) times daily as needed for muscle spasms.  Marland Kitchen LANTUS SOLOSTAR 100 UNIT/ML Solostar Pen Inject 46 Units into the skin every morning.   . Loratadine (CLARITIN PO) Take 1 tablet by mouth daily.  . metFORMIN (GLUCOPHAGE) 500 MG tablet Take 1,000 mg by mouth 2 (two) times daily.  . nitroGLYCERIN (NITROSTAT) 0.4 MG SL tablet Place 0.4 mg under the tongue every 5 (five) minutes x 3 doses as needed for chest pain.  Marland Kitchen nystatin-triamcinolone ointment (MYCOLOG) Apply 1 application topically as directed.   Marland Kitchen PRADAXA 150 MG CAPS Take 150 mg by mouth 2 (two) times daily.   . Probiotic Product (ALIGN PO) Take 1 capsule by mouth daily.  . ramipril (ALTACE) 5 MG capsule Take 1 capsule (5 mg total) by mouth 2 (two) times daily.  . rosuvastatin (CRESTOR) 10 MG tablet Take 10 mg by mouth at bedtime.   . senna (SENOKOT) 8.6 MG tablet Take 1 tablet by mouth at bedtime as needed for constipation.  . traZODone (DESYREL) 50 MG tablet Take 25 mg by mouth at bedtime.  . vitamin B-12 (CYANOCOBALAMIN) 250 MCG tablet Take 250 mcg by mouth daily.     Allergies:   Codeine; Penicillins; Atenolol; Atorvastatin; Metoprolol; and Zetia [ezetimibe]   Social History   Tobacco Use  . Smoking status: Never Smoker  . Smokeless tobacco: Never Used  Substance Use Topics  . Alcohol use: No    Alcohol/week: 0.0 standard drinks  . Drug use: No     Family Hx: The patient's family history includes Heart failure in her mother; Neuromuscular disorder in her father.  ROS:   Please see the history of present illness.    Review of Systems    Cardiovascular: Positive for leg swelling.  Neurological: Positive for dizziness and loss of balance.   All other systems reviewed and are negative.   EKGs/Labs/Other Test Reviewed:    EKG:  EKG is  ordered today.  The ekg ordered today demonstrates normal sinus rhythm, heart rate 79, left axis deviation, septal Q waves, QTC 442, similar to prior tracings  Recent Labs: 11/15/2018: ALT 16; BUN 22; Creatinine, Ser 0.70; Hemoglobin 12.2; Platelets 165; Potassium 4.2; Sodium 141   Recent Lipid Panel Lab Results  Component Value Date/Time   CHOL 137 08/05/2014 04:53 AM   TRIG 105 08/05/2014 04:53 AM   HDL 65 08/05/2014 04:53 AM   CHOLHDL 2.1 08/05/2014 04:53 AM   LDLCALC 51 08/05/2014 04:53 AM    Physical Exam:  VS:  BP 132/70   Pulse 79   Ht 5\' 2"  (1.575 m)   Wt 174 lb (78.9 kg)   SpO2 96%   BMI 31.83 kg/m     Wt Readings from Last 3 Encounters:  12/08/18 174 lb (78.9 kg)  11/20/18 174 lb (78.9 kg)  11/15/18 181 lb (82.1 kg)     Physical Exam  Constitutional: [name] is oriented to person, place, and time. [name] appears well-developed and well-nourished. No distress.  HENT:  Head: Normocephalic and atraumatic.  Eyes: No scleral icterus.  Neck: No JVD present.  Cardiovascular: Normal rate and regular rhythm.  No murmur heard. Pulmonary/Chest: Effort normal. [name] has no rales.  Abdominal: Soft.  Musculoskeletal: [name] exhibits edema (1+ bilat LE edema).  Neurological: [name] is alert and oriented to person, place, and time.  Skin: Skin is warm and dry.    ASSESSMENT & PLAN:    Paroxysmal atrial fibrillation (HCC) Maintaining normal sinus rhythm.  As noted, [name] recently presented to the emergency room with speech difficulty and some confusion.  [name] has followed up with neurology.  Her symptoms could be explained by a complicated migraine.  However, transient ischemic attack cannot be ruled out.  [name] was asked to follow-up with cardiology to see if her anticoagulant should be  changed.  Unfortunately, there is no head-to-head data between direct oral anticoagulants to demonstrate one agent being superior to the other.  I did review her case today with our clinical pharmacist.  The only consideration would be to change dabigatran to apixaban given its lower bleeding profile.  The patient is not eager to change medications.  We agreed that I would review this further with her primary cardiologist, Dr. Katrinka Blazing before we make any changes.  Coronary artery disease involving native coronary artery of native heart without angina pectoris  History of CABG in 2006.  [name] denies anginal symptoms.  [name] is not on aspirin as [name] is on Pradaxa.  Continue statin.  Essential hypertension  The patient's blood pressure is controlled on her current regimen.  Continue current therapy.   Dispo:  Return in about 9 months (around 09/09/2019) for Routine Follow Up w/ Dr. Katrinka Blazing.   Medication Adjustments/Labs and Tests Ordered: Current medicines are reviewed at length with the patient today.  Concerns regarding medicines are outlined above.  Tests Ordered: Orders Placed This Encounter  Procedures  . EKG 12-Lead   Medication Changes: No orders of the defined types were placed in this encounter.   Signed, Tereso Newcomer, PA-C  12/08/2018 3:07 PM    [facility] [address] [place], [place], [place]  [address] Phone: [phone]; Fax: [phone]

## 2018-12-08 NOTE — Patient Instructions (Signed)
Medication Instructions:  Your physician recommends that you continue on your current medications as directed. Please refer to the Current Medication list given to you today.  If you need a refill on your cardiac medications before your next appointment, please call your pharmacy.   Lab work: NONE If you have labs (blood work) drawn today and your tests are completely normal, you will receive your results only by: Marland Kitchen MyChart Message (if you have MyChart) OR . A paper copy in the mail If you have any lab test that is abnormal or we need to change your treatment, we will call you to review the results.  Testing/Procedures: NONE  Follow-Up: At [facility], you and your health needs are our priority.  As part of our continuing mission to provide you with exceptional heart care, we have created designated Provider Care Teams.  These Care Teams include your primary Cardiologist (physician) and Advanced Practice Providers (APPs -  Physician Assistants and Nurse Practitioners) who all work together to provide you with the care you need, when you need it. You will need a follow up appointment in 08/2019  Please call our office 2 months in advance to schedule this appointment.  You may see Sinclair Grooms, MD or one of the following Advanced Practice Providers on your designated Care Team:   Truitt Merle, NP Cecilie Kicks, NP . Kathyrn Drown, NP  Any Other Special Instructions Will Be Listed Below (If Applicable).

## 2018-12-09 ENCOUNTER — Ambulatory Visit: Payer: Medicare Other | Attending: Neurology | Admitting: Rehabilitative and Restorative Service Providers"

## 2018-12-09 DIAGNOSIS — R42 Dizziness and giddiness: Secondary | ICD-10-CM | POA: Diagnosis not present

## 2018-12-09 DIAGNOSIS — R2689 Other abnormalities of gait and mobility: Secondary | ICD-10-CM | POA: Insufficient documentation

## 2018-12-09 DIAGNOSIS — M6281 Muscle weakness (generalized): Secondary | ICD-10-CM | POA: Diagnosis not present

## 2018-12-09 NOTE — Patient Instructions (Addendum)
Habituation - Tip Card  1.The goal of habituation training is to assist in decreasing symptoms of vertigo, dizziness, or nausea provoked by specific head and body motions. 2.These exercises may initially increase symptoms; however, be persistent and work through symptoms. With repetition and time, the exercises will assist in reducing or eliminating symptoms. 3.Exercises should be stopped and discussed with the therapist if you experience any of the following: - Sudden change or fluctuation in hearing - New onset of ringing in the ears, or increase in current intensity - Any fluid discharge from the ear - Severe pain in neck or back - Extreme nausea  Copyright  VHI. All rights reserved.   *Have help to do this exercise*  Habituation - Rolling   With pillow under head, start on back. Roll to your right side.  Hold until dizziness stops, plus 20 seconds and then roll to the left side.  Hold until dizziness stops, plus 20 seconds.  Repeat sequence 5 times per session. Do 2 sessions per day.  Copyright  VHI. All rights reserved.

## 2018-12-10 ENCOUNTER — Encounter: Payer: Self-pay | Admitting: Rehabilitative and Restorative Service Providers"

## 2018-12-10 NOTE — Therapy (Addendum)
[facility] [address] [place], [place], [address] Phone: [phone]   Fax:  [phone]  Physical Therapy Evaluation and Discharge Summary  Patient Details  Name: [name] MRN: [id number] Date of Birth: 04-09-1934 Referring Provider (PT): Metta Clines, DO   Encounter Date: 12/09/2018  PT End of Session - 12/10/18 2013    Visit Number  1    Number of Visits  5    Date for PT Re-Evaluation  01/24/19    Authorization Type  medicare and BCBS supplemental    PT Start Time  1155    PT Stop Time  6720    PT Time Calculation (min)  40 min    Activity Tolerance  Patient tolerated treatment well;Treatment limited secondary to medical complications (Comment)   had nausea that she attributes to blood sugar drop -  PT provided crackers   Behavior During Therapy  [facility] for tasks assessed/performed       Past Medical History:  Diagnosis Date  . Anginal pain ([place])    "saw doctor-think it was esophagus spasm"  . Arthritis   . Atrial flutter ([place]) 11/02/2013  . Cataract    left eye  . Coronary artery disease   . Diabetes mellitus without complication ([place])    type 2  . Essential hypertension, benign 11/02/2013  . H/O jaundice    as child  . Headache 9470   complicated migraine x2   . Hearing trouble   . Heart disease   . Hepatitis    hx of hepatitis A as child  . High cholesterol    under control  . Long term (current) use of anticoagulants 11/02/2013   Pradaxa   . Measles    as child  . Mouth dryness   . Mumps    as teenager  . Numbness and tingling    lips  . Osteoporosis    "unsure-maybe in knees"  . Psoriasis   . Psoriatic arthritis ([place])   . Stroke ([place]) 2012  . Vertigo    being monitored, occasional    Past Surgical History:  Procedure Laterality Date  . APPENDECTOMY  as child  . CARDIAC CATHETERIZATION  ~2006   clear  . CORONARY ARTERY BYPASS GRAFT  02/16/2005  . LUMBAR DISC SURGERY   09/2004   L1-L5  . TONSILLECTOMY  as child   with adenoids  . TOTAL KNEE ARTHROPLASTY Left 09/11/2015   Procedure: LEFT TOTAL KNEE ARTHROPLASTY;  Surgeon: Gaynelle Arabian, MD;  Location: [facility];  Service: Orthopedics;  Laterality: Left;    There were no vitals filed for this visit.       [facility] PT Assessment - 12/10/18 0001      Ambulation/Gait   Ambulation/Gait  Yes    Gait Comments  Patient walks mod indep for short distances in the clinic.  PT to further assess gait and balance at next session-- she notes she wants the dizziness addressed and does not want LE strengthening exercises because her knees bother her.            Vestibular Assessment - 12/09/18 2008      Vestibular Assessment   General Observation  dizziness rolling to the right      Symptom Behavior   Type of Dizziness  --   movement in her head   Frequency of Dizziness  daily    Duration of Dizziness  seconds    Aggravating Factors  Rolling to right  [facility] [address] [place], [place], [address] Phone: [phone]   Fax:  [phone]  Physical Therapy Evaluation and Discharge Summary  Patient Details  Name: [name] MRN: [id number] Date of Birth: 04-09-1934 Referring Provider (PT): Metta Clines, DO   Encounter Date: 12/09/2018  PT End of Session - 12/10/18 2013    Visit Number  1    Number of Visits  5    Date for PT Re-Evaluation  01/24/19    Authorization Type  medicare and BCBS supplemental    PT Start Time  1155    PT Stop Time  6720    PT Time Calculation (min)  40 min    Activity Tolerance  Patient tolerated treatment well;Treatment limited secondary to medical complications (Comment)   had nausea that she attributes to blood sugar drop -  PT provided crackers   Behavior During Therapy  [facility] for tasks assessed/performed       Past Medical History:  Diagnosis Date  . Anginal pain ([place])    "saw doctor-think it was esophagus spasm"  . Arthritis   . Atrial flutter ([place]) 11/02/2013  . Cataract    left eye  . Coronary artery disease   . Diabetes mellitus without complication ([place])    type 2  . Essential hypertension, benign 11/02/2013  . H/O jaundice    as child  . Headache 9470   complicated migraine x2   . Hearing trouble   . Heart disease   . Hepatitis    hx of hepatitis A as child  . High cholesterol    under control  . Long term (current) use of anticoagulants 11/02/2013   Pradaxa   . Measles    as child  . Mouth dryness   . Mumps    as teenager  . Numbness and tingling    lips  . Osteoporosis    "unsure-maybe in knees"  . Psoriasis   . Psoriatic arthritis ([place])   . Stroke ([place]) 2012  . Vertigo    being monitored, occasional    Past Surgical History:  Procedure Laterality Date  . APPENDECTOMY  as child  . CARDIAC CATHETERIZATION  ~2006   clear  . CORONARY ARTERY BYPASS GRAFT  02/16/2005  . LUMBAR DISC SURGERY   09/2004   L1-L5  . TONSILLECTOMY  as child   with adenoids  . TOTAL KNEE ARTHROPLASTY Left 09/11/2015   Procedure: LEFT TOTAL KNEE ARTHROPLASTY;  Surgeon: Gaynelle Arabian, MD;  Location: [facility];  Service: Orthopedics;  Laterality: Left;    There were no vitals filed for this visit.       [facility] PT Assessment - 12/10/18 0001      Ambulation/Gait   Ambulation/Gait  Yes    Gait Comments  Patient walks mod indep for short distances in the clinic.  PT to further assess gait and balance at next session-- she notes she wants the dizziness addressed and does not want LE strengthening exercises because her knees bother her.            Vestibular Assessment - 12/09/18 2008      Vestibular Assessment   General Observation  dizziness rolling to the right      Symptom Behavior   Type of Dizziness  --   movement in her head   Frequency of Dizziness  daily    Duration of Dizziness  seconds    Aggravating Factors  Rolling to right  Potential  Good    PT Frequency  1x / week   eval +    PT Duration  4 weeks    PT  Treatment/Interventions  ADLs/Self Care Home Management;Therapeutic activities;Therapeutic exercise;Gait training;Stair training;Functional mobility training;Neuromuscular re-education;Balance training;Patient/family education;Vestibular;Canalith Repostioning    PT Next Visit Plan  reassess positional symptoms; treat as indicated.  Provide HEP/ [place] for LE strengthening and fall prevention.    Consulted and Agree with Plan of Care  Patient       Patient will benefit from skilled therapeutic intervention in order to improve the following deficits and impairments:  Abnormal gait, Dizziness, Decreased activity tolerance, Decreased balance, Decreased mobility, Decreased strength, Pain  Visit Diagnosis: Dizziness and giddiness  Other abnormalities of gait and mobility  Muscle weakness (generalized)   Patient did not return.  *no further therapy visits. addended 02/18/2019 Rudell Cobb, PT   Problem List Patient Active Problem List   Diagnosis Date Noted  . OA (osteoarthritis) of knee 09/11/2015  . BPPV (benign paroxysmal positional vertigo) 10/31/2014  . Complicated migraine 24/46/2863  . TIA (transient ischemic attack) 08/04/2014  . Coronary artery disease involving coronary bypass graft of native heart with angina pectoris ([place]) 11/02/2013    Class: Chronic  . Atrial flutter ([place]) 11/02/2013    Class: Chronic  . Essential hypertension, benign 11/02/2013  . Long term current use of anticoagulant therapy 11/02/2013    Class: Acute    Sherrina Zaugg, PT 12/10/2018, 8:39 PM  [facility] [address] [place], [place], [address] Phone: [phone]   Fax:  [phone]  Name: [name] MRN: [id number] Date of Birth: Jun 18, 1934

## 2018-12-16 ENCOUNTER — Telehealth: Payer: Self-pay | Admitting: Neurology

## 2018-12-16 NOTE — Telephone Encounter (Signed)
Called and spoke with [facility].Pt awoke at 3 am with a severe headache. Angel asked Pt if was the worst headache she ever had, Pt said it was not the worst headache, but was extremely painful. Glenard Haring gave her an Arthritis strength Tylenol, Pt has been asleep for about 3 hours. I advised her to make sure Pt is hydrated and continues to rest for the remainder of the day. I cautioned her to call 911 if she noticed any slurring or other signs of stroke. Glenard Haring will call the office tomorrow early if Pt still has severe headache

## 2018-12-16 NOTE — Telephone Encounter (Signed)
Patient's daughter called in and left vm about her mom having a really bad headache. Please call her back at [phone]. Thanks!

## 2018-12-21 NOTE — Telephone Encounter (Signed)
I spoke with patient's daughter in law and instructed her to take patient to the ER if she thinks she may be having a stroke.  She said that she does not think that this is a stroke because the symptoms come and go and patient went to ER recently and was diagnosed with a complicated migraine.  She would like for patient to be seen sooner than her scheduled appointment so I told her that we would put her on our waiting list.

## 2018-12-21 NOTE — Telephone Encounter (Signed)
Patient's da\ughter called regarding this patient who may need a follow up due to headaches and Numbness in left arm (Lower part) and the lips are tingling. They are wondering could it be stroke symptoms? Please Call. Thanks

## 2018-12-30 ENCOUNTER — Telehealth: Payer: Self-pay | Admitting: Physician Assistant

## 2018-12-30 NOTE — Telephone Encounter (Addendum)
Please call patient. At her last visit with me we discussed whether or not we should change her Pradaxa to a different medication. I spoke with Dr. Tamala Julian. He does not want to change her medications at all.   Continue all of her current medications the same. Richardson Dopp, PA-C    12/30/2018 7:08 PM

## 2018-12-30 NOTE — Progress Notes (Signed)
She has had side effects in the past on Xa inhibitors, Eliquis and Xarelto.  I do not believe Coumadin would be as effective.  I would recommend staying the course on her current regimen.

## 2018-12-31 NOTE — Telephone Encounter (Signed)
Called patient, and spoke with daughter in low, Kani Jobson [facility]). Informed her that Dr. Tamala Julian does not want patient to change Pradaxa to a different medication. Levada Dy verbalized understanding and will follow up with Dr. Tomi Likens.

## 2019-01-01 ENCOUNTER — Telehealth: Payer: Self-pay | Admitting: Neurology

## 2019-01-01 ENCOUNTER — Ambulatory Visit: Payer: Medicare Other | Admitting: Rehabilitative and Restorative Service Providers"

## 2019-01-01 NOTE — Telephone Encounter (Signed)
Called and spoke with [name], Pt's daughter in law. Advised we have added her to the wait list as high priority

## 2019-01-01 NOTE — Telephone Encounter (Signed)
Patient's daughter in law called for [place] regarding her Severe Headache that she is having. She said in the past 2 weeks she has had them, but prior to that it has been 40 Years. She is asking if there is anything Dr. Tomi Likens can call in for her? She said the pain level is a 9 out of 10.  Please Call. Thanks

## 2019-01-01 NOTE — Telephone Encounter (Signed)
Dr. Carlyle Lipa office called and is needing this patient seen soon due to her headaches. I spoke with Tammy and scheduled her on Monday 01/11/19. Thanks

## 2019-01-07 ENCOUNTER — Telehealth: Payer: Self-pay | Admitting: Neurology

## 2019-01-07 DIAGNOSIS — R197 Diarrhea, unspecified: Secondary | ICD-10-CM | POA: Diagnosis not present

## 2019-01-07 DIAGNOSIS — E538 Deficiency of other specified B group vitamins: Secondary | ICD-10-CM | POA: Diagnosis not present

## 2019-01-07 DIAGNOSIS — Z794 Long term (current) use of insulin: Secondary | ICD-10-CM | POA: Diagnosis not present

## 2019-01-07 DIAGNOSIS — I2510 Atherosclerotic heart disease of native coronary artery without angina pectoris: Secondary | ICD-10-CM | POA: Diagnosis not present

## 2019-01-07 DIAGNOSIS — E11319 Type 2 diabetes mellitus with unspecified diabetic retinopathy without macular edema: Secondary | ICD-10-CM | POA: Diagnosis not present

## 2019-01-07 NOTE — Telephone Encounter (Signed)
Called and LMOVM for [name] to return my call

## 2019-01-07 NOTE — Telephone Encounter (Signed)
Patient was on for 01-11-19 and then 01-12-19 to see Dr Tomi Likens and would like to speak to someone about being seen by another provider  And what is going on with her please call

## 2019-01-07 NOTE — Telephone Encounter (Signed)
[name] returned my call. I advised her Dr. Tomi Likens is the headache specialist in the practice. She would prefer the Pt see him. They are on the wait list.

## 2019-01-08 ENCOUNTER — Encounter: Payer: Medicare Other | Admitting: Rehabilitative and Restorative Service Providers"

## 2019-01-11 ENCOUNTER — Ambulatory Visit: Payer: Medicare Other | Admitting: Neurology

## 2019-01-12 ENCOUNTER — Ambulatory Visit: Payer: Medicare Other | Admitting: Neurology

## 2019-01-12 ENCOUNTER — Encounter

## 2019-01-15 ENCOUNTER — Ambulatory Visit: Payer: Medicare Other | Admitting: Rehabilitative and Restorative Service Providers"

## 2019-01-25 ENCOUNTER — Encounter: Payer: Self-pay | Admitting: Neurology

## 2019-01-25 ENCOUNTER — Ambulatory Visit (INDEPENDENT_AMBULATORY_CARE_PROVIDER_SITE_OTHER): Payer: Medicare Other | Admitting: Neurology

## 2019-01-25 VITALS — BP 110/68 | HR 90 | Ht 62.0 in | Wt 174.0 lb

## 2019-01-25 DIAGNOSIS — E785 Hyperlipidemia, unspecified: Secondary | ICD-10-CM | POA: Diagnosis not present

## 2019-01-25 DIAGNOSIS — G43109 Migraine with aura, not intractable, without status migrainosus: Secondary | ICD-10-CM

## 2019-01-25 DIAGNOSIS — I25709 Atherosclerosis of coronary artery bypass graft(s), unspecified, with unspecified angina pectoris: Secondary | ICD-10-CM | POA: Diagnosis not present

## 2019-01-25 DIAGNOSIS — I484 Atypical atrial flutter: Secondary | ICD-10-CM | POA: Diagnosis not present

## 2019-01-25 DIAGNOSIS — E119 Type 2 diabetes mellitus without complications: Secondary | ICD-10-CM

## 2019-01-25 DIAGNOSIS — I10 Essential (primary) hypertension: Secondary | ICD-10-CM | POA: Diagnosis not present

## 2019-01-25 NOTE — Progress Notes (Signed)
NEUROLOGY FOLLOW UP OFFICE NOTE  [name] [id number]  HISTORY OF PRESENT ILLNESS: [name] is an 85 year old left-handed female with atrial flutter, coronary artery disease, type 2 diabetes mellitus, hypertension, high cholesterol, and history of stroke whom I recently saw for stroke-like symptoms thought to be complex migraines presents for worsening headaches.  She is accompanied by her son who supplements history.  UPDATE: Following last office visit in November, she developed recurrence of vertigo, nausea and head fullness as well as intermittent speech disturbance unilateral left sided numbness and weakness.   Symptoms lasted several hours.  She was switched from metformin to Victoza in early January and she has not had a recurrent headache since then.   She still reports speech difficulty and some memory problems.    01/07/19 LABS:  LDLc 66, Hgb A1c 7.6  Current medications:   Current pain reliever:  Ultracet (helps with headache but not recently needed) Current blood thinner:  Pradaxa Current statin:  Crestor 10mg  Current diabetic medications:  Lantus, Victoza Current antihypertensive:  Coreg Current anxiolytic:  Klonopin 0.5 mg PRN for sleep Current sleep aid:  Klonopin, trazadone  HISTORY: She presented to the ED on 11/15/2018 for strokelike symptoms.  She had trouble saying words that she was thinking (not word-finding difficulty) and slurred speech.  She was also confused, unable to figure out how to work the washing machine or use her glucometer.  She also noted mild right-sided headache.  She did not have any other focal or lateralizing symptoms such as unilateral numbness or weakness.  Symptoms lasted several hours.  EKG showed sinus rhythm.  CT of the head was personally reviewed and revealed no acute abnormalities.  MRI of the brain was personally reviewed and demonstrated known old left cerebellar infarct as well as other chronic small vessel ischemic changes  but no acute stroke.  CTA of the head and neck was personally reviewed and showed no emergent large vessel occlusion or high-grade stenosis of the intracranial and extracranial arteries.  Other than an elevated serum glucose of 214, labs such as CBC, CMP, UA, urine rapid drug screen, and ethanol were negative.  Since then, she has been more weak and sleepy.  She can barely get up out of her chair.  She is not as ambulatory.  She has prior history of stroke.  In 2012, she had a cerebellar stroke secondary to atrial flutter.  In 2015 she had episode of word-finding difficulty and right sided head numbness and confusion (unable to enter her computer password).  MRI of brain showed no acute stroke.  She was diagnosed with complicated migraine.  She does have remote history of frequent common migraines in her 43s and 30s.    PAST MEDICAL HISTORY: Past Medical History:  Diagnosis Date  . Anginal pain ([place])    "saw doctor-think it was esophagus spasm"  . Arthritis   . Atrial flutter ([place]) 11/02/2013  . Cataract    left eye  . Coronary artery disease   . Diabetes mellitus without complication ([place])    type 2  . Essential hypertension, benign 11/02/2013  . H/O jaundice    as child  . Headache 7564   complicated migraine x2   . Hearing trouble   . Heart disease   . Hepatitis    hx of hepatitis A as child  . High cholesterol    under control  . Long term (current) use of anticoagulants 11/02/2013   Pradaxa   . Measles  as child  . Mouth dryness   . Mumps    as teenager  . Numbness and tingling    lips  . Osteoporosis    "unsure-maybe in knees"  . Psoriasis   . Psoriatic arthritis ([place])   . Stroke ([place]) 2012  . Vertigo    being monitored, occasional    MEDICATIONS: Current Outpatient Medications on File Prior to Visit  Medication Sig Dispense Refill  . acetaminophen (TYLENOL) 500 MG tablet Take 500 mg by mouth every 6 (six) hours as needed for mild pain.    . calcium elemental as  carbonate (TUMS ULTRA 1000) 400 MG chewable tablet Chew 1,000 mg by mouth every evening.    . carvedilol (COREG) 25 MG tablet TAKE (1) TABLET TWICE A DAY WITH FOOD. 180 tablet 3  . cholecalciferol (VITAMIN D) 1000 units tablet Take 2,000 Units by mouth daily.    . clobetasol cream (TEMOVATE) 3.66 % Apply 1 application topically 2 (two) times daily as needed (skin irritation).    . clonazePAM (KLONOPIN) 0.5 MG tablet Take 0.5 mg by mouth at bedtime as needed (for sleep).     . cyclobenzaprine (FLEXERIL) 10 MG tablet Take 10 mg by mouth 3 (three) times daily as needed for muscle spasms.    Marland Kitchen LANTUS SOLOSTAR 100 UNIT/ML Solostar Pen Inject 46 Units into the skin every morning.   4  . Loratadine (CLARITIN PO) Take 1 tablet by mouth daily.    . metFORMIN (GLUCOPHAGE) 500 MG tablet Take 1,000 mg by mouth 2 (two) times daily.    . nitroGLYCERIN (NITROSTAT) 0.4 MG SL tablet Place 0.4 mg under the tongue every 5 (five) minutes x 3 doses as needed for chest pain.    Marland Kitchen nystatin-triamcinolone ointment (MYCOLOG) Apply 1 application topically as directed.   0  . PRADAXA 150 MG CAPS Take 150 mg by mouth 2 (two) times daily.     . Probiotic Product (ALIGN PO) Take 1 capsule by mouth daily.    . ramipril (ALTACE) 5 MG capsule Take 1 capsule (5 mg total) by mouth 2 (two) times daily. 60 capsule 3  . rosuvastatin (CRESTOR) 10 MG tablet Take 10 mg by mouth at bedtime.     . senna (SENOKOT) 8.6 MG tablet Take 1 tablet by mouth at bedtime as needed for constipation.    . traZODone (DESYREL) 50 MG tablet Take 25 mg by mouth at bedtime.  5  . vitamin B-12 (CYANOCOBALAMIN) 250 MCG tablet Take 250 mcg by mouth daily.     No current facility-administered medications on file prior to visit.     ALLERGIES: Allergies  Allergen Reactions  . Codeine Nausea And Vomiting  . Penicillins Swelling and Other (See Comments)    Swelling of tongue  . Atenolol Other (See Comments)  . Atorvastatin     Muscle soreness  .  Metoprolol Other (See Comments)  . Zetia [Ezetimibe] Other (See Comments)    FAMILY HISTORY: Family History  Problem Relation Age of Onset  . Heart failure Mother   . Neuromuscular disorder Father    SOCIAL HISTORY: Social History   Socioeconomic History  . Marital status: Married    Spouse name: Not on file  . Number of children: 3  . Years of education: BS  . Highest education level: Not on file  Occupational History  . Not on file  Social Needs  . Financial resource strain: Not on file  . Food insecurity:    Worry: Not on  file    Inability: Not on file  . Transportation needs:    Medical: Not on file    Non-medical: Not on file  Tobacco Use  . Smoking status: Never Smoker  . Smokeless tobacco: Never Used  Substance and Sexual Activity  . Alcohol use: No    Alcohol/week: 0.0 standard drinks  . Drug use: No  . Sexual activity: Not on file  Lifestyle  . Physical activity:    Days per week: Not on file    Minutes per session: Not on file  . Stress: Not on file  Relationships  . Social connections:    Talks on phone: Not on file    Gets together: Not on file    Attends religious service: Not on file    Active member of club or organization: Not on file    Attends meetings of clubs or organizations: Not on file    Relationship status: Not on file  . Intimate partner violence:    Fear of current or ex partner: Not on file    Emotionally abused: Not on file    Physically abused: Not on file    Forced sexual activity: Not on file  Other Topics Concern  . Not on file  Social History Narrative   Patient is married with 3 children.   Patient is left handed.   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   Patient drinks 3 cups daily.    REVIEW OF SYSTEMS: Constitutional: No fevers, chills, or sweats, no generalized fatigue, change in appetite Eyes: No visual changes, double vision, eye pain Ear, nose and throat: No hearing loss, ear pain, nasal  congestion, sore throat Cardiovascular: No chest pain, palpitations Respiratory:  No shortness of breath at rest or with exertion, wheezes GastrointestinaI: No nausea, vomiting, diarrhea, abdominal pain, fecal incontinence Genitourinary:  No dysuria, urinary retention or frequency Musculoskeletal:  No neck pain, back pain Integumentary: No rash, pruritus, skin lesions Neurological: as above Psychiatric: No depression, insomnia, anxiety Endocrine: No palpitations, fatigue, diaphoresis, mood swings, change in appetite, change in weight, increased thirst Hematologic/Lymphatic:  No purpura, petechiae. Allergic/Immunologic: no itchy/runny eyes, nasal congestion, recent allergic reactions, rashes  PHYSICAL EXAM: Blood pressure 110/68, pulse 90, height 5\' 2"  (1.575 m), weight 174 lb (78.9 kg), SpO2 96 %. General: No acute distress.  Patient appears well-groomed.   habitus. Head:  Normocephalic/atraumatic Eyes:  Fundi examined but not visualized Neck: supple, no paraspinal tenderness, full range of motion Heart:  Regular rate and rhythm Lungs:  Clear to auscultation bilaterally Back: No paraspinal tenderness Neurological Exam: alert and oriented to person, place, and time. Attention span and concentration intact, recent and remote memory intact, fund of knowledge intact.  Speech fluent, trace dysarthria, language intact.  CN II-XII intact. Bulk and tone normal, muscle strength 5/5 throughout.  Sensation to light touch, temperature and vibration intact.  Deep tendon reflexes 1+ throughout except absent in ankles, toes downgoing.  Finger to nose and heel to shin testing intact.  Slightly wide-based gait. Romberg with sway.  IMPRESSION: 1.  Complicated migraine, possibly aggravated by metformin. 2.  HTN 3.  HLD 4.  Type 2 diabetes mellitus 5.  CAD 6.  Atrial flutter  PLAN: 1.  Ultracet as needed for headache 2.  Continue secondary stroke prevention:  Pradaxa, statin therapy, blood pressure  control.  Optimize glycemic control 3.  Follow up in April as already scheduled.  26 minutes spent face to face with patient, over 50%  spent discussing management.  Metta Clines, DO  CC:  Lajean Manes, MD

## 2019-01-25 NOTE — Patient Instructions (Signed)
1.  Optimize diabetes control 2.  Follow up in April as scheduled.

## 2019-01-26 ENCOUNTER — Ambulatory Visit: Payer: Medicare Other | Admitting: Neurology

## 2019-03-24 DIAGNOSIS — Z79899 Other long term (current) drug therapy: Secondary | ICD-10-CM | POA: Diagnosis not present

## 2019-03-24 DIAGNOSIS — M2550 Pain in unspecified joint: Secondary | ICD-10-CM | POA: Diagnosis not present

## 2019-03-24 DIAGNOSIS — M150 Primary generalized (osteo)arthritis: Secondary | ICD-10-CM | POA: Diagnosis not present

## 2019-03-24 DIAGNOSIS — L4059 Other psoriatic arthropathy: Secondary | ICD-10-CM | POA: Diagnosis not present

## 2019-03-24 DIAGNOSIS — L409 Psoriasis, unspecified: Secondary | ICD-10-CM | POA: Diagnosis not present

## 2019-04-05 ENCOUNTER — Encounter

## 2019-04-05 ENCOUNTER — Ambulatory Visit: Payer: Medicare Other | Admitting: Neurology

## 2019-04-06 DIAGNOSIS — Z794 Long term (current) use of insulin: Secondary | ICD-10-CM | POA: Diagnosis not present

## 2019-04-06 DIAGNOSIS — I2510 Atherosclerotic heart disease of native coronary artery without angina pectoris: Secondary | ICD-10-CM | POA: Diagnosis not present

## 2019-04-06 DIAGNOSIS — K5900 Constipation, unspecified: Secondary | ICD-10-CM | POA: Diagnosis not present

## 2019-04-06 DIAGNOSIS — E11319 Type 2 diabetes mellitus with unspecified diabetic retinopathy without macular edema: Secondary | ICD-10-CM | POA: Diagnosis not present

## 2019-04-06 DIAGNOSIS — Z8639 Personal history of other endocrine, nutritional and metabolic disease: Secondary | ICD-10-CM | POA: Diagnosis not present

## 2019-04-17 ENCOUNTER — Emergency Department (HOSPITAL_COMMUNITY): Payer: Medicare Other

## 2019-04-17 ENCOUNTER — Inpatient Hospital Stay (HOSPITAL_COMMUNITY)
Admission: EM | Admit: 2019-04-17 | Discharge: 2019-04-22 | DRG: 469 | Disposition: A | Discharge status: Home Health | Payer: Medicare Other | Attending: Internal Medicine | Admitting: Internal Medicine

## 2019-04-17 ENCOUNTER — Other Ambulatory Visit: Payer: Self-pay

## 2019-04-17 ENCOUNTER — Encounter (HOSPITAL_COMMUNITY): Payer: Self-pay | Admitting: *Deleted

## 2019-04-17 ENCOUNTER — Inpatient Hospital Stay (HOSPITAL_COMMUNITY): Payer: Medicare Other

## 2019-04-17 DIAGNOSIS — W19XXXA Unspecified fall, initial encounter: Secondary | ICD-10-CM

## 2019-04-17 DIAGNOSIS — T380X5A Adverse effect of glucocorticoids and synthetic analogues, initial encounter: Secondary | ICD-10-CM | POA: Diagnosis present

## 2019-04-17 DIAGNOSIS — E785 Hyperlipidemia, unspecified: Secondary | ICD-10-CM | POA: Diagnosis present

## 2019-04-17 DIAGNOSIS — G43109 Migraine with aura, not intractable, without status migrainosus: Secondary | ICD-10-CM | POA: Diagnosis present

## 2019-04-17 DIAGNOSIS — Z96642 Presence of left artificial hip joint: Secondary | ICD-10-CM | POA: Diagnosis not present

## 2019-04-17 DIAGNOSIS — Z471 Aftercare following joint replacement surgery: Secondary | ICD-10-CM | POA: Diagnosis not present

## 2019-04-17 DIAGNOSIS — I25709 Atherosclerosis of coronary artery bypass graft(s), unspecified, with unspecified angina pectoris: Secondary | ICD-10-CM

## 2019-04-17 DIAGNOSIS — E119 Type 2 diabetes mellitus without complications: Secondary | ICD-10-CM

## 2019-04-17 DIAGNOSIS — R4701 Aphasia: Secondary | ICD-10-CM | POA: Diagnosis present

## 2019-04-17 DIAGNOSIS — M810 Age-related osteoporosis without current pathological fracture: Secondary | ICD-10-CM | POA: Diagnosis present

## 2019-04-17 DIAGNOSIS — S72012A Unspecified intracapsular fracture of left femur, initial encounter for closed fracture: Secondary | ICD-10-CM | POA: Diagnosis not present

## 2019-04-17 DIAGNOSIS — G4700 Insomnia, unspecified: Secondary | ICD-10-CM | POA: Diagnosis present

## 2019-04-17 DIAGNOSIS — R471 Dysarthria and anarthria: Secondary | ICD-10-CM | POA: Diagnosis present

## 2019-04-17 DIAGNOSIS — I480 Paroxysmal atrial fibrillation: Secondary | ICD-10-CM | POA: Diagnosis present

## 2019-04-17 DIAGNOSIS — Z880 Allergy status to penicillin: Secondary | ICD-10-CM

## 2019-04-17 DIAGNOSIS — S72002A Fracture of unspecified part of neck of left femur, initial encounter for closed fracture: Secondary | ICD-10-CM | POA: Diagnosis not present

## 2019-04-17 DIAGNOSIS — Z96649 Presence of unspecified artificial hip joint: Secondary | ICD-10-CM

## 2019-04-17 DIAGNOSIS — I25119 Atherosclerotic heart disease of native coronary artery with unspecified angina pectoris: Secondary | ICD-10-CM | POA: Diagnosis not present

## 2019-04-17 DIAGNOSIS — Z794 Long term (current) use of insulin: Secondary | ICD-10-CM | POA: Diagnosis not present

## 2019-04-17 DIAGNOSIS — G9341 Metabolic encephalopathy: Secondary | ICD-10-CM | POA: Diagnosis not present

## 2019-04-17 DIAGNOSIS — Z8781 Personal history of (healed) traumatic fracture: Secondary | ICD-10-CM | POA: Diagnosis not present

## 2019-04-17 DIAGNOSIS — Z419 Encounter for procedure for purposes other than remedying health state, unspecified: Secondary | ICD-10-CM

## 2019-04-17 DIAGNOSIS — M069 Rheumatoid arthritis, unspecified: Secondary | ICD-10-CM

## 2019-04-17 DIAGNOSIS — E1151 Type 2 diabetes mellitus with diabetic peripheral angiopathy without gangrene: Secondary | ICD-10-CM | POA: Diagnosis present

## 2019-04-17 DIAGNOSIS — D62 Acute posthemorrhagic anemia: Secondary | ICD-10-CM | POA: Diagnosis not present

## 2019-04-17 DIAGNOSIS — Z885 Allergy status to narcotic agent status: Secondary | ICD-10-CM

## 2019-04-17 DIAGNOSIS — Z820 Family history of epilepsy and other diseases of the nervous system: Secondary | ICD-10-CM

## 2019-04-17 DIAGNOSIS — E7800 Pure hypercholesterolemia, unspecified: Secondary | ICD-10-CM | POA: Diagnosis not present

## 2019-04-17 DIAGNOSIS — I4891 Unspecified atrial fibrillation: Secondary | ICD-10-CM | POA: Diagnosis present

## 2019-04-17 DIAGNOSIS — Y9301 Activity, walking, marching and hiking: Secondary | ICD-10-CM | POA: Diagnosis present

## 2019-04-17 DIAGNOSIS — M1990 Unspecified osteoarthritis, unspecified site: Secondary | ICD-10-CM | POA: Diagnosis not present

## 2019-04-17 DIAGNOSIS — M160 Bilateral primary osteoarthritis of hip: Secondary | ICD-10-CM | POA: Diagnosis not present

## 2019-04-17 DIAGNOSIS — Z96652 Presence of left artificial knee joint: Secondary | ICD-10-CM | POA: Diagnosis present

## 2019-04-17 DIAGNOSIS — Z8673 Personal history of transient ischemic attack (TIA), and cerebral infarction without residual deficits: Secondary | ICD-10-CM

## 2019-04-17 DIAGNOSIS — Y92018 Other place in single-family (private) house as the place of occurrence of the external cause: Secondary | ICD-10-CM

## 2019-04-17 DIAGNOSIS — E1169 Type 2 diabetes mellitus with other specified complication: Secondary | ICD-10-CM

## 2019-04-17 DIAGNOSIS — Z8249 Family history of ischemic heart disease and other diseases of the circulatory system: Secondary | ICD-10-CM

## 2019-04-17 DIAGNOSIS — R4182 Altered mental status, unspecified: Secondary | ICD-10-CM | POA: Diagnosis not present

## 2019-04-17 DIAGNOSIS — R42 Dizziness and giddiness: Secondary | ICD-10-CM | POA: Diagnosis not present

## 2019-04-17 DIAGNOSIS — H9190 Unspecified hearing loss, unspecified ear: Secondary | ICD-10-CM | POA: Diagnosis present

## 2019-04-17 DIAGNOSIS — I4892 Unspecified atrial flutter: Secondary | ICD-10-CM | POA: Diagnosis not present

## 2019-04-17 DIAGNOSIS — Z79899 Other long term (current) drug therapy: Secondary | ICD-10-CM

## 2019-04-17 DIAGNOSIS — Z951 Presence of aortocoronary bypass graft: Secondary | ICD-10-CM

## 2019-04-17 DIAGNOSIS — I2510 Atherosclerotic heart disease of native coronary artery without angina pectoris: Secondary | ICD-10-CM | POA: Diagnosis present

## 2019-04-17 DIAGNOSIS — S72042A Displaced fracture of base of neck of left femur, initial encounter for closed fracture: Secondary | ICD-10-CM | POA: Diagnosis not present

## 2019-04-17 DIAGNOSIS — I447 Left bundle-branch block, unspecified: Secondary | ICD-10-CM | POA: Diagnosis present

## 2019-04-17 DIAGNOSIS — R29709 NIHSS score 9: Secondary | ICD-10-CM | POA: Diagnosis present

## 2019-04-17 DIAGNOSIS — Z79891 Long term (current) use of opiate analgesic: Secondary | ICD-10-CM

## 2019-04-17 DIAGNOSIS — L4050 Arthropathic psoriasis, unspecified: Secondary | ICD-10-CM | POA: Diagnosis present

## 2019-04-17 DIAGNOSIS — R410 Disorientation, unspecified: Secondary | ICD-10-CM | POA: Diagnosis not present

## 2019-04-17 DIAGNOSIS — I483 Typical atrial flutter: Secondary | ICD-10-CM | POA: Diagnosis not present

## 2019-04-17 DIAGNOSIS — R52 Pain, unspecified: Secondary | ICD-10-CM | POA: Diagnosis not present

## 2019-04-17 DIAGNOSIS — R29702 NIHSS score 2: Secondary | ICD-10-CM | POA: Diagnosis not present

## 2019-04-17 DIAGNOSIS — W1830XA Fall on same level, unspecified, initial encounter: Secondary | ICD-10-CM | POA: Diagnosis present

## 2019-04-17 DIAGNOSIS — S299XXA Unspecified injury of thorax, initial encounter: Secondary | ICD-10-CM | POA: Diagnosis not present

## 2019-04-17 DIAGNOSIS — I10 Essential (primary) hypertension: Secondary | ICD-10-CM | POA: Diagnosis not present

## 2019-04-17 DIAGNOSIS — Z7901 Long term (current) use of anticoagulants: Secondary | ICD-10-CM

## 2019-04-17 DIAGNOSIS — Z888 Allergy status to other drugs, medicaments and biological substances status: Secondary | ICD-10-CM

## 2019-04-17 DIAGNOSIS — S0990XA Unspecified injury of head, initial encounter: Secondary | ICD-10-CM | POA: Diagnosis not present

## 2019-04-17 DIAGNOSIS — R4189 Other symptoms and signs involving cognitive functions and awareness: Secondary | ICD-10-CM | POA: Diagnosis not present

## 2019-04-17 DIAGNOSIS — I639 Cerebral infarction, unspecified: Secondary | ICD-10-CM | POA: Diagnosis not present

## 2019-04-17 LAB — CBC WITH DIFFERENTIAL/PLATELET
Abs Immature Granulocytes: 0.06 10*3/uL (ref 0.00–0.07)
Basophils Absolute: 0 10*3/uL (ref 0.0–0.1)
Basophils Relative: 0 %
Eosinophils Absolute: 0.1 10*3/uL (ref 0.0–0.5)
Eosinophils Relative: 1 %
HCT: 40 % (ref 36.0–46.0)
Hemoglobin: 12.7 g/dL (ref 12.0–15.0)
Immature Granulocytes: 1 %
Lymphocytes Relative: 16 %
Lymphs Abs: 1.6 10*3/uL (ref 0.7–4.0)
MCH: 28.2 pg (ref 26.0–34.0)
MCHC: 31.8 g/dL (ref 30.0–36.0)
MCV: 88.9 fL (ref 80.0–100.0)
Monocytes Absolute: 0.5 10*3/uL (ref 0.1–1.0)
Monocytes Relative: 5 %
Neutro Abs: 7.8 10*3/uL — ABNORMAL HIGH (ref 1.7–7.7)
Neutrophils Relative %: 77 %
Platelets: 156 10*3/uL (ref 150–400)
RBC: 4.5 MIL/uL (ref 3.87–5.11)
RDW: 12.2 % (ref 11.5–15.5)
WBC: 10 10*3/uL (ref 4.0–10.5)
nRBC: 0 % (ref 0.0–0.2)

## 2019-04-17 LAB — CBC
HCT: 39.8 % (ref 36.0–46.0)
Hemoglobin: 13.2 g/dL (ref 12.0–15.0)
MCH: 29.4 pg (ref 26.0–34.0)
MCHC: 33.2 g/dL (ref 30.0–36.0)
MCV: 88.6 fL (ref 80.0–100.0)
Platelets: 154 10*3/uL (ref 150–400)
RBC: 4.49 MIL/uL (ref 3.87–5.11)
RDW: 12.3 % (ref 11.5–15.5)
WBC: 10.1 10*3/uL (ref 4.0–10.5)
nRBC: 0 % (ref 0.0–0.2)

## 2019-04-17 LAB — TYPE AND SCREEN
ABO/RH(D): A POS
Antibody Screen: NEGATIVE

## 2019-04-17 LAB — BASIC METABOLIC PANEL
Anion gap: 6 (ref 5–15)
Anion gap: 8 (ref 5–15)
BUN: 16 mg/dL (ref 8–23)
BUN: 17 mg/dL (ref 8–23)
CO2: 24 mmol/L (ref 22–32)
CO2: 23 mmol/L (ref 22–32)
Calcium: 8.6 mg/dL — ABNORMAL LOW (ref 8.9–10.3)
Calcium: 8.9 mg/dL (ref 8.9–10.3)
Chloride: 107 mmol/L (ref 98–111)
Chloride: 104 mmol/L (ref 98–111)
Creatinine, Ser: 0.64 mg/dL (ref 0.44–1.00)
Creatinine, Ser: 0.74 mg/dL (ref 0.44–1.00)
GFR calc Af Amer: >60 mL/min (ref >60)
GFR calc Af Amer: >60 mL/min (ref >60)
GFR calc non Af Amer: >60 mL/min (ref >60)
GFR calc non Af Amer: >60 mL/min (ref >60)
Glucose, Bld: 179 mg/dL — ABNORMAL HIGH (ref 70–99)
Glucose, Bld: 149 mg/dL — ABNORMAL HIGH (ref 70–99)
Potassium: 4.2 mmol/L (ref 3.5–5.1)
Potassium: 4.1 mmol/L (ref 3.5–5.1)
Sodium: 137 mmol/L (ref 135–145)
Sodium: 135 mmol/L (ref 135–145)

## 2019-04-17 LAB — PROTIME-INR
INR: 1.5 — ABNORMAL HIGH (ref 0.8–1.2)
INR: 1.3 — ABNORMAL HIGH (ref 0.8–1.2)
Prothrombin Time: 17.7 seconds — ABNORMAL HIGH (ref 11.4–15.2)
Prothrombin Time: 15.9 seconds — ABNORMAL HIGH (ref 11.4–15.2)

## 2019-04-17 LAB — TROPONIN I
Troponin I: <0.03 ng/mL (ref <0.03)
Troponin I: <0.03 ng/mL (ref <0.03)

## 2019-04-17 LAB — BLOOD GAS, VENOUS
Acid-Base Excess: 1.4 mmol/L (ref 0.0–2.0)
Bicarbonate: 26.8 mmol/L (ref 20.0–28.0)
O2 Saturation: 46.6 %
Patient temperature: 98.6
pCO2, Ven: 48 mmHg (ref 44.0–60.0)
pH, Ven: 7.367 (ref 7.250–7.430)

## 2019-04-17 LAB — CBG MONITORING, ED: Glucose-Capillary: 176 mg/dL — ABNORMAL HIGH (ref 70–99)

## 2019-04-17 LAB — LACTIC ACID, PLASMA: Lactic Acid, Venous: 1.3 mmol/L (ref 0.5–1.9)

## 2019-04-17 LAB — GLUCOSE, CAPILLARY
Glucose-Capillary: 144 mg/dL — ABNORMAL HIGH (ref 70–99)
Glucose-Capillary: 157 mg/dL — ABNORMAL HIGH (ref 70–99)
Glucose-Capillary: 129 mg/dL — ABNORMAL HIGH (ref 70–99)

## 2019-04-17 MED ORDER — ROSUVASTATIN CALCIUM 10 MG PO TABS
10.0000 mg | ORAL_TABLET | Freq: Every day | ORAL | Status: DC
  Administered 2019-04-18 – 2019-04-21 (×4): 10 mg via ORAL
  Filled 2019-04-17 (×4): qty 1

## 2019-04-17 MED ORDER — FENTANYL CITRATE (PF) 100 MCG/2ML IJ SOLN: 12.5000 ug | Freq: Once | INTRAMUSCULAR | Status: DC

## 2019-04-17 MED ORDER — ACETAMINOPHEN 500 MG PO TABS
1000.0000 mg | ORAL_TABLET | Freq: Once | ORAL | Status: AC
  Administered 2019-04-18: 1000 mg via ORAL
  Filled 2019-04-17: qty 2

## 2019-04-17 MED ORDER — INSULIN GLARGINE 100 UNIT/ML SOLOSTAR PEN: 25.0000 [IU] | PEN_INJECTOR | SUBCUTANEOUS | Status: DC

## 2019-04-17 MED ORDER — MORPHINE SULFATE (PF) 2 MG/ML IV SOLN
2.0000 mg | INTRAVENOUS | Status: DC | PRN
  Administered 2019-04-17 – 2019-04-18 (×2): 2 mg via INTRAVENOUS
  Filled 2019-04-17 (×2): qty 1

## 2019-04-17 MED ORDER — INSULIN ASPART 100 UNIT/ML ~~LOC~~ SOLN
0.0000 [IU] | Freq: Every day | SUBCUTANEOUS | Status: DC
  Administered 2019-04-19: 4 [IU] via SUBCUTANEOUS
  Administered 2019-04-20: 5 [IU] via SUBCUTANEOUS
  Administered 2019-04-21: 3 [IU] via SUBCUTANEOUS

## 2019-04-17 MED ORDER — SODIUM CHLORIDE 0.9% FLUSH
3.0000 mL | Freq: Two times a day (BID) | INTRAVENOUS | Status: DC
  Administered 2019-04-17 – 2019-04-22 (×6): 3 mL via INTRAVENOUS

## 2019-04-17 MED ORDER — RAMIPRIL 5 MG PO CAPS: 5.0000 mg | ORAL_CAPSULE | Freq: Two times a day (BID) | ORAL | Status: DC

## 2019-04-17 MED ORDER — SODIUM CHLORIDE 0.9% FLUSH: 3.0000 mL | INTRAVENOUS | Status: DC | PRN

## 2019-04-17 MED ORDER — SODIUM CHLORIDE 0.9 % IV SOLN
INTRAVENOUS | Status: DC
  Administered 2019-04-17: 17:00:00 via INTRAVENOUS

## 2019-04-17 MED ORDER — ONDANSETRON HCL 4 MG PO TABS: 4.0000 mg | ORAL_TABLET | Freq: Four times a day (QID) | ORAL | Status: DC | PRN

## 2019-04-17 MED ORDER — CYANOCOBALAMIN 500 MCG PO TABS
250.0000 ug | ORAL_TABLET | Freq: Every day | ORAL | Status: DC
  Administered 2019-04-18 – 2019-04-22 (×4): 250 ug via ORAL
  Filled 2019-04-17 (×5): qty 1

## 2019-04-17 MED ORDER — INSULIN ASPART 100 UNIT/ML ~~LOC~~ SOLN
0.0000 [IU] | Freq: Three times a day (TID) | SUBCUTANEOUS | Status: DC
  Administered 2019-04-17 – 2019-04-18 (×3): 1 [IU] via SUBCUTANEOUS

## 2019-04-17 MED ORDER — HYDRALAZINE HCL 20 MG/ML IJ SOLN: 10.0000 mg | Freq: Four times a day (QID) | INTRAMUSCULAR | Status: DC | PRN

## 2019-04-17 MED ORDER — TRAZODONE HCL 50 MG PO TABS
25.0000 mg | ORAL_TABLET | Freq: Every day | ORAL | Status: DC
  Administered 2019-04-18 – 2019-04-21 (×4): 25 mg via ORAL
  Filled 2019-04-17 (×4): qty 1

## 2019-04-17 MED ORDER — ONDANSETRON HCL 4 MG/2ML IJ SOLN
4.0000 mg | Freq: Four times a day (QID) | INTRAMUSCULAR | Status: DC | PRN
  Administered 2019-04-17: 4 mg via INTRAVENOUS
  Filled 2019-04-17: qty 2

## 2019-04-17 MED ORDER — CLONAZEPAM 0.5 MG PO TABS
0.5000 mg | ORAL_TABLET | Freq: Every evening | ORAL | Status: DC | PRN
  Administered 2019-04-21: 0.5 mg via ORAL
  Filled 2019-04-17: qty 1

## 2019-04-17 MED ORDER — FENTANYL CITRATE (PF) 100 MCG/2ML IJ SOLN
25.0000 ug | Freq: Once | INTRAMUSCULAR | Status: DC
  Administered 2019-04-17: 12.5 ug via INTRAVENOUS
  Filled 2019-04-17: qty 2

## 2019-04-17 MED ORDER — INSULIN GLARGINE 100 UNIT/ML ~~LOC~~ SOLN
20.0000 [IU] | Freq: Every day | SUBCUTANEOUS | Status: DC
  Administered 2019-04-17 – 2019-04-21 (×5): 20 [IU] via SUBCUTANEOUS
  Filled 2019-04-17 (×6): qty 0.2

## 2019-04-17 MED ORDER — SODIUM CHLORIDE 0.9 % IV SOLN
INTRAVENOUS | Status: DC
  Administered 2019-04-18 – 2019-04-19 (×5): via INTRAVENOUS

## 2019-04-17 MED ORDER — CYCLOBENZAPRINE HCL 10 MG PO TABS: 10.0000 mg | ORAL_TABLET | Freq: Three times a day (TID) | ORAL | Status: DC | PRN

## 2019-04-17 MED ORDER — ACETAMINOPHEN 650 MG RE SUPP: 650.0000 mg | Freq: Four times a day (QID) | RECTAL | Status: DC | PRN

## 2019-04-17 MED ORDER — ENOXAPARIN SODIUM 40 MG/0.4ML ~~LOC~~ SOLN
40.0000 mg | SUBCUTANEOUS | Status: DC
  Administered 2019-04-18: 40 mg via SUBCUTANEOUS
  Filled 2019-04-17: qty 0.4

## 2019-04-17 MED ORDER — TRAMADOL-ACETAMINOPHEN 37.5-325 MG PO TABS: 1.0000 | ORAL_TABLET | ORAL | Status: DC

## 2019-04-17 MED ORDER — MAGNESIUM SULFATE 2 GM/50ML IV SOLN
2.0000 g | Freq: Once | INTRAVENOUS | Status: AC
  Administered 2019-04-17: 2 g via INTRAVENOUS
  Filled 2019-04-17: qty 50

## 2019-04-17 MED ORDER — SODIUM CHLORIDE 0.9 % IV SOLN
Freq: Once | INTRAVENOUS | Status: AC
  Administered 2019-04-17: 13:00:00 via INTRAVENOUS

## 2019-04-17 MED ORDER — IOHEXOL 350 MG/ML SOLN
100.0000 mL | Freq: Once | INTRAVENOUS | Status: AC | PRN
  Administered 2019-04-17: 100 mL via INTRAVENOUS

## 2019-04-17 MED ORDER — ACETAMINOPHEN 325 MG PO TABS
650.0000 mg | ORAL_TABLET | Freq: Four times a day (QID) | ORAL | Status: DC | PRN
  Administered 2019-04-18: 650 mg via ORAL
  Filled 2019-04-17: qty 2

## 2019-04-17 MED ORDER — SODIUM CHLORIDE (PF) 0.9 % IJ SOLN
INTRAMUSCULAR | Status: AC
  Filled 2019-04-17: qty 50

## 2019-04-17 MED ORDER — KETOROLAC TROMETHAMINE 15 MG/ML IJ SOLN
15.0000 mg | Freq: Four times a day (QID) | INTRAMUSCULAR | Status: DC | PRN
  Administered 2019-04-17 – 2019-04-18 (×2): 15 mg via INTRAVENOUS
  Filled 2019-04-17 (×2): qty 1

## 2019-04-17 MED ORDER — SODIUM CHLORIDE 0.9 % IV SOLN: 250.0000 mL | INTRAVENOUS | Status: DC | PRN

## 2019-04-17 MED ORDER — CARVEDILOL 12.5 MG PO TABS
12.5000 mg | ORAL_TABLET | Freq: Two times a day (BID) | ORAL | Status: DC
  Administered 2019-04-18 – 2019-04-22 (×7): 12.5 mg via ORAL
  Filled 2019-04-17 (×7): qty 1

## 2019-04-17 NOTE — Progress Notes (Signed)
Called by [facility] regarding pts head CT which showed no acute intracranial abnormalities and no change from scan performed earlier.  Arby Barrette AGPCNP-BC, AGNP-C [facility] Pager [phone]

## 2019-04-17 NOTE — H&P (View-Only) (Signed)
ORTHOPAEDIC CONSULTATION  REQUESTING PHYSICIAN: Debbe Odea, MD  PCP:  Lajean Manes, MD  Chief Complaint: Fall  HPI: [name] is a 85 y.o. female who complains of left hip pain following a ground-level fall prior to presentation.  She presented to the [facility] emergency department.  While there on her initial and secondary survey she was noted to have left hip pain and confirmed femoral neck fracture on x-rays.  She is an established patient of my partner Dr. Gaynelle Arabian, and I was called for inpatient consultation and management of the above fracture.   She does seem to have a little bit of confusion on exam which has been ongoing for the last couple of months intermittently.  She has had a neurologic work-up that was essentially negative.  She is on Pradaxa for atrial fibrillation.  Due to some of the confusion the subjective component of this exam was obtained via chart review, discussion with the emergency department provider, and the hospitalist, and in conversation directly with her son Dr. Julieanne Manson.   She currently does not endorse any numbness or tingling.  She does not endorse any other injuries at this point.  She does live independently with her husband whom she helps provide daily care of.  She does not use assistive devices for ambulation at baseline. Past Medical History:  Diagnosis Date   Anginal pain ([place])    "saw doctor-think it was esophagus spasm"   Arthritis    Atrial flutter ([place]) 11/02/2013   Cataract    left eye   Coronary artery disease    Diabetes mellitus without complication ([place])    type 2   Essential hypertension, benign 11/02/2013   H/O jaundice    as child   Headache 3532   complicated migraine x2    Hearing trouble    Heart disease    Hepatitis    hx of hepatitis A as child   High cholesterol    under control   Long term (current) use of anticoagulants 11/02/2013   Pradaxa    Measles    as child   Mouth  dryness    Mumps    as teenager   Numbness and tingling    lips   Osteoporosis    "unsure-maybe in knees"   Psoriasis    Psoriatic arthritis ([place])    Stroke ([place]) 2012   Vertigo    being monitored, occasional   Past Surgical History:  Procedure Laterality Date   APPENDECTOMY  as child   CARDIAC CATHETERIZATION  ~2006   clear   CORONARY ARTERY BYPASS GRAFT  02/16/2005   LUMBAR [place] SURGERY  09/2004   L1-L5   TONSILLECTOMY  as child   with adenoids   TOTAL KNEE ARTHROPLASTY Left 09/11/2015   Procedure: LEFT TOTAL KNEE ARTHROPLASTY;  Surgeon: Gaynelle Arabian, MD;  Location: [facility];  Service: Orthopedics;  Laterality: Left;   Social History   Socioeconomic History   Marital status: Married    Spouse name: Not on file   Number of children: 3   Years of education: BS   Highest education level: Not on file  Occupational History   Not on file  Social Needs   Financial resource strain: Not on file   Food insecurity:    Worry: Not on file    Inability: Not on file   Transportation needs:    Medical: Not on file    Non-medical: Not on file  Tobacco Use  Smoking status: Never Smoker   Smokeless tobacco: Never Used  Substance and Sexual Activity   Alcohol use: No    Alcohol/week: 0.0 standard drinks   Drug use: No   Sexual activity: Not on file  Lifestyle   Physical activity:    Days per week: Not on file    Minutes per session: Not on file   Stress: Not on file  Relationships   Social connections:    Talks on phone: Not on file    Gets together: Not on file    Attends religious service: Not on file    Active member of club or organization: Not on file    Attends meetings of clubs or organizations: Not on file    Relationship status: Not on file  Other Topics Concern   Not on file  Social History Narrative   Patient is married with 3 children.   Patient is left handed.   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   Patient  drinks 3 cups daily.   Family History  Problem Relation Age of Onset   Heart failure Mother    Neuromuscular disorder Father    Allergies  Allergen Reactions   Codeine Nausea And Vomiting   Penicillins Swelling and Other (See Comments)    Swelling of tongue   Atenolol Other (See Comments)   Atorvastatin     Muscle soreness   Metoprolol Other (See Comments)   Zetia [Ezetimibe] Other (See Comments)   Prior to Admission medications   Medication Sig Start Date End Date Taking? Authorizing Provider  acetaminophen (TYLENOL) 500 MG tablet Take 500 mg by mouth every 6 (six) hours as needed for mild pain.    [provider]  calcium elemental as carbonate (TUMS ULTRA 1000) 400 MG chewable tablet Chew 1,000 mg by mouth every evening.    [provider]  carvedilol (COREG) 25 MG tablet TAKE (1) TABLET TWICE A DAY WITH FOOD. 10/05/18   Belva Crome, MD  cholecalciferol (VITAMIN D) 1000 units tablet Take 2,000 Units by mouth daily.    [provider]  clobetasol cream (TEMOVATE) 2.35 % Apply 1 application topically 2 (two) times daily as needed (skin irritation).    [provider]  clonazePAM (KLONOPIN) 0.5 MG tablet Take 0.5 mg by mouth at bedtime as needed (for sleep).  06/16/12   [provider]  cyclobenzaprine (FLEXERIL) 10 MG tablet Take 10 mg by mouth 3 (three) times daily as needed for muscle spasms.    [provider]  LANTUS SOLOSTAR 100 UNIT/ML Solostar Pen Inject 46 Units into the skin every morning.  10/19/15   [provider]  Loratadine (CLARITIN PO) Take 1 tablet by mouth daily.    [provider]  nitroGLYCERIN (NITROSTAT) 0.4 MG SL tablet Place 0.4 mg under the tongue every 5 (five) minutes x 3 doses as needed for chest pain.    [provider]  nystatin-triamcinolone ointment (MYCOLOG) Apply 1 application topically as directed.  10/03/14   [provider]  PRADAXA 150 MG CAPS Take  150 mg by mouth 2 (two) times daily.  07/18/12   [provider]  Probiotic Product (ALIGN PO) Take 1 capsule by mouth daily.    [provider]  ramipril (ALTACE) 5 MG capsule Take 1 capsule (5 mg total) by mouth 2 (two) times daily. 07/09/18   Belva Crome, MD  rosuvastatin (CRESTOR) 10 MG tablet Take 10 mg by mouth at bedtime.  [provider]  senna (SENOKOT) 8.6 MG tablet Take 1 tablet by mouth at bedtime as needed for constipation.    [provider]  traZODone (DESYREL) 50 MG tablet Take 25 mg by mouth at bedtime. 10/30/18   [provider]  VICTOZA 18 MG/3ML SOPN Inject 1.8 mg into the skin as directed.  01/07/19   [provider]  vitamin B-12 (CYANOCOBALAMIN) 250 MCG tablet Take 250 mcg by mouth daily.    [provider]   Ct Head Wo Contrast  Result Date: 04/17/2019 CLINICAL DATA:  New onset of confusion.  Blood thinners. EXAM: CT HEAD WITHOUT CONTRAST TECHNIQUE: Contiguous axial images were obtained from the base of the skull through the vertex without intravenous contrast. COMPARISON:  April 17, 2019 FINDINGS: Brain: No subdural, epidural, or subarachnoid hemorrhage. Stable left superior cerebellar infarct. Cerebellum, brainstem, and basal cisterns are otherwise normal. Scattered white matter changes are noted. Ventricles and sulci are unremarkable. No mass effect or midline shift. No acute cortical ischemia or infarct. Vascular: No hyperdense vessel or unexpected calcification. Skull: Normal. Negative for fracture or focal lesion. Sinuses/Orbits: No acute finding. Other: None. IMPRESSION: 1. No acute intracranial abnormalities. Scattered white matter changes. Chronic left cerebellar infarct. No intracranial hemorrhage noted. Electronically Signed   By: Dorise Bullion III M.D   On: 04/17/2019 13:18   Ct Head Wo Contrast  Result Date: 04/17/2019 CLINICAL DATA:  Unwitnessed fall. EXAM: CT HEAD WITHOUT CONTRAST TECHNIQUE:  Contiguous axial images were obtained from the base of the skull through the vertex without intravenous contrast. COMPARISON:  Head CT and MRI 11/15/2018 FINDINGS: Brain: There is no evidence of acute infarct, intracranial hemorrhage, mass, midline shift, or extra-axial fluid collection. Mild cerebral atrophy is within normal limits for age. A chronic superior left cerebellar infarct is unchanged. Patchy cerebral white matter hypodensities are unchanged and nonspecific but compatible with moderate chronic small vessel ischemic disease. Vascular: Calcified atherosclerosis at the skull base. No hyperdense vessel. Skull: No fracture or focal osseous lesion. Sinuses/Orbits: Visualized paranasal sinuses and mastoid air cells are clear. Bilateral cataract extraction is noted. Other: None. IMPRESSION: 1. No evidence of acute intracranial abnormality. 2. Moderate chronic small vessel ischemic disease. Electronically Signed   By: Logan Bores M.D.   On: 04/17/2019 11:36   Dg Chest Port 1 View  Result Date: 04/17/2019 CLINICAL DATA:  Unwitnessed fall. EXAM: PORTABLE CHEST 1 VIEW COMPARISON:  March 24, 2006 FINDINGS: The heart size and mediastinal contours are within normal limits. Both lungs are clear. The visualized skeletal structures are unremarkable. IMPRESSION: No active disease. Electronically Signed   By: Dorise Bullion III M.D   On: 04/17/2019 11:32   Dg Hip Unilat W Or Wo Pelvis 2-3 Views Left  Result Date: 04/17/2019 CLINICAL DATA:  LEFT hip pain EXAM: DG HIP (WITH OR WITHOUT PELVIS) 2-3V LEFT COMPARISON:  None. FINDINGS: There is shortening of the LEFT femoral neck. There is linear superimposition of bone density along the inferior margin. These findings are consistent with LEFT femoral neck fracture. Fracture appears to be subcapital in location. No significant angulation. There is bilateral joint space narrowing of the hip joints. Sclerosis of the acetabular roofs. IMPRESSION: 1. Subcapital LEFT femoral  neck fracture. 2. Bilateral osteoarthritis of the hip joints. Electronically Signed   By: Suzy Bouchard M.D.   On: 04/17/2019 11:34    Positive ROS: All other systems have been reviewed and were otherwise negative with the exception of those mentioned in the HPI and  as above.  Physical Exam: General: Alert, no acute distress.   Cardiovascular: No pedal edema Respiratory: No cyanosis, no use of accessory musculature GI: No organomegaly, abdomen is soft and non-tender Skin: No lesions in the area of chief complaint Neurologic: Sensation intact distally Psychiatric: Patient is having some confusion at this point and difficulty with word finding. Lymphatic: No axillary or cervical lymphadenopathy  MUSCULOSKELETAL:  On my secondary survey she has no tenderness noted on the bilateral upper extremities, cervical spine, pelvis, or right lower extremity.   The left lower extremity is held in external rotation and slightly shortened.  She is neurovascularly intact throughout.  Assessment: Left closed intracapsular hip fracture.  Plan: -I reviewed the radiographs and discussed the findings with the patient and her son.  At this time the recommendation will be for a total hip arthroplasty due to the intracapsular nature of this fracture.  Due to her being on Pradaxa we would recommend waiting at least 48 hours from last dose which will put her into Monday morning.  -I agree with the current pain regimen established by the hospitalist.  We would like to avoid narcotics due to the confusion.  She seems to be clearing a bit per her son while we examined her in the room.  -My partner Dr. Wynelle Link is available and will be performing the surgery on Monday morning.  He is her established orthopedic surgeon.  - NPO Sunday night please.    Nicholes Stairs, MD Cell [phone]    04/17/2019 5:11 PM

## 2019-04-17 NOTE — Plan of Care (Signed)
Patient received from ED via stretcher. Complains of nausea after moving from stretcher to bed; nausea and pain medication given. IV to left arm infusing. Lower extremities cool to touch; 1+ pedal pulses bilaterally. Alert and responsive; oriented to person and place on initial evaluation. Intermittent confusion noted. Will continue to monitor.

## 2019-04-17 NOTE — H&P (Addendum)
History and Physical    [name]  YQM:[id number]  DOB: 06/05/1934  DOA: 04/17/2019 PCP: [name], MD   Patient coming from: home  Chief Complaint: fall  HPI: [name] is a 85 y.o. female with medical history of A-flutter, cAD, DM2, HTN, HLD, CVA, vertigo, Complex migrains who presents after a fall and is found to have a left hip fracture.   On my eval, she is confused and unable to contribute to history. She is repeating "There is something not right, I am cold". She is not able to tell me her name or where she is.  Per history obtained from ED doctor, RN and her son, she fell this AM while walking in her basement and was brought in by EMS. She told the ED doctor that she did not hit her head. She was oriented when she first arrived in the ED but became confused while she was in the ED.  Per her son, [name],  she has become confused from narcotics in the past. In addition she had an episode in 11/19 of change in speech and right frontal headache. CVA work up was negative and she was suspected to have complicated migraines. She was having 2-3 episodes a week which stopped after her Metfomin was discontinued.  ED Course:  Head CT at 11:24 and second CT at 1 PM both negative for acute abnormalities.  Xray hip and Pelvis: Subcapital LEFT femoral neck fracture. Bilateral osteoarthritis of the hip joints. INR 1.5  Review of Systems:  Cannot obtain  Past Medical History:  Diagnosis Date   Anginal pain (HCC)    "saw doctor-think it was esophagus spasm"   Arthritis    Atrial flutter (HCC) 11/02/2013   Cataract    left eye   Coronary artery disease    Diabetes mellitus without complication (HCC)    type 2   Essential hypertension, benign 11/02/2013   H/O jaundice    as child   Headache 2015   complicated migraine x2    Hearing trouble    Heart disease    Hepatitis    hx of hepatitis A as child   High cholesterol    under control   Long  term (current) use of anticoagulants 11/02/2013   Pradaxa    Measles    as child   Mouth dryness    Mumps    as teenager   Numbness and tingling    lips   Osteoporosis    "unsure-maybe in knees"   Psoriasis    Psoriatic arthritis (HCC)    Stroke (HCC) 2012   Vertigo    being monitored, occasional    Past Surgical History:  Procedure Laterality Date   APPENDECTOMY  as child   CARDIAC CATHETERIZATION  ~2006   clear   CORONARY ARTERY BYPASS GRAFT  02/16/2005   LUMBAR DISC SURGERY  09/2004   L1-L5   TONSILLECTOMY  as child   with adenoids   TOTAL KNEE ARTHROPLASTY Left 09/11/2015   Procedure: LEFT TOTAL KNEE ARTHROPLASTY;  Surgeon: Ollen Gross, MD;  Location: [facility];  Service: Orthopedics;  Laterality: Left;    Social History:   reports that she has never smoked. She has never used smokeless tobacco. She reports that she does not drink alcohol or use drugs.  Allergies  Allergen Reactions   Codeine Nausea And Vomiting   Penicillins Swelling and Other (See Comments)    Swelling of tongue   Atenolol Other (See Comments)  Atorvastatin     Muscle soreness   Metoprolol Other (See Comments)   Zetia [Ezetimibe] Other (See Comments)    Family History  Problem Relation Age of Onset   Heart failure Mother    Neuromuscular disorder Father      Prior to Admission medications   Medication Sig Start Date End Date Taking? Authorizing Provider  acetaminophen (TYLENOL) 500 MG tablet Take 500 mg by mouth every 6 (six) hours as needed for mild pain.    [provider]  calcium elemental as carbonate (TUMS ULTRA 1000) 400 MG chewable tablet Chew 1,000 mg by mouth every evening.    [provider]  carvedilol (COREG) 25 MG tablet TAKE (1) TABLET TWICE A DAY WITH FOOD. 10/05/18   Lyn Records, MD  cholecalciferol (VITAMIN D) 1000 units tablet Take 2,000 Units by mouth daily.    [provider]  clobetasol cream (TEMOVATE) 0.05 %  Apply 1 application topically 2 (two) times daily as needed (skin irritation).    [provider]  clonazePAM (KLONOPIN) 0.5 MG tablet Take 0.5 mg by mouth at bedtime as needed (for sleep).  06/16/12   [provider]  cyclobenzaprine (FLEXERIL) 10 MG tablet Take 10 mg by mouth 3 (three) times daily as needed for muscle spasms.    [provider]  LANTUS SOLOSTAR 100 UNIT/ML Solostar Pen Inject 46 Units into the skin every morning.  10/19/15   [provider]  Loratadine (CLARITIN PO) Take 1 tablet by mouth daily.    [provider]  nitroGLYCERIN (NITROSTAT) 0.4 MG SL tablet Place 0.4 mg under the tongue every 5 (five) minutes x 3 doses as needed for chest pain.    [provider]  nystatin-triamcinolone ointment (MYCOLOG) Apply 1 application topically as directed.  10/03/14   [provider]  PRADAXA 150 MG CAPS Take 150 mg by mouth 2 (two) times daily.  07/18/12   [provider]  Probiotic Product (ALIGN PO) Take 1 capsule by mouth daily.    [provider]  ramipril (ALTACE) 5 MG capsule Take 1 capsule (5 mg total) by mouth 2 (two) times daily. 07/09/18   Lyn Records, MD  rosuvastatin (CRESTOR) 10 MG tablet Take 10 mg by mouth at bedtime.     [provider]  senna (SENOKOT) 8.6 MG tablet Take 1 tablet by mouth at bedtime as needed for constipation.    [provider]  traMADol-acetaminophen (ULTRACET) 37.5-325 MG tablet Take 1-2 tablets by mouth as directed. 01/01/19   [provider]  traZODone (DESYREL) 50 MG tablet Take 25 mg by mouth at bedtime. 10/30/18   [provider]  VICTOZA 18 MG/3ML SOPN Inject 1.8 mg into the skin as directed.  01/07/19   [provider]  vitamin B-12 (CYANOCOBALAMIN) 250 MCG tablet Take 250 mcg by mouth daily.    [provider]    Physical Exam: Wt Readings from Last 3 Encounters:  01/25/19 78.9 kg  12/08/18 78.9 kg  11/20/18 78.9  kg   Vitals:   04/17/19 1043 04/17/19 1059 04/17/19 1200 04/17/19 1300  BP:   (!) 153/81 (!) 159/78  Pulse:  88 93 94  Resp:   14 14  Temp:  98.4 F (36.9 C)    TempSrc:  Oral    SpO2: 98% 95% 94% 98%      Constitutional:  Appears mildly anxious Eyes: PERRLA, lids and conjunctivae normal ENT:  She will not open her mouth to  allow exam Neck: Supple, no masses  Respiratory:  Clear to auscultation bilaterally  Normal respiratory effort.  Cardiovascular:  S1 & S2 heard, regular rate and rhythm No Murmurs Abdomen:  Non distended No tenderness, No masses Bowel sounds normal Extremities:  No clubbing / cyanosis No pedal edema No joint deformity    Skin:  No rashes, lesions or ulcers Neurologic:  Alert but disoriented x 3 CN 2-12 appear grossly intact Does not move extremities on her own and does not follow command- normal muscle tone Psychiatric:  confused    Labs on Admission: I have personally reviewed following labs and imaging studies  CBC: Recent Labs  Lab 04/17/19 1048  WBC 10.0  NEUTROABS 7.8*  HGB 12.7  HCT 40.0  MCV 88.9  PLT 156   Basic Metabolic Panel: Recent Labs  Lab 04/17/19 1048  NA 137  K 4.2  CL 107  CO2 24  GLUCOSE 179*  BUN 16  CREATININE 0.64  CALCIUM 8.6*   GFR: CrCl cannot be calculated (Unknown ideal weight.). Liver Function Tests: No results for input(s): AST, ALT, ALKPHOS, BILITOT, PROT, ALBUMIN in the last 168 hours. No results for input(s): LIPASE, AMYLASE in the last 168 hours. No results for input(s): AMMONIA in the last 168 hours. Coagulation Profile: Recent Labs  Lab 04/17/19 1206  INR 1.5*   Cardiac Enzymes: Recent Labs  Lab 04/17/19 1155  TROPONINI <0.03   BNP (last 3 results) No results for input(s): PROBNP in the last 8760 hours. HbA1C: No results for input(s): HGBA1C in the last 72 hours. CBG: Recent Labs  Lab 04/17/19 1152  GLUCAP 176*   Lipid Profile: No results for input(s): CHOL, HDL,  LDLCALC, TRIG, CHOLHDL, LDLDIRECT in the last 72 hours. Thyroid Function Tests: No results for input(s): TSH, T4TOTAL, FREET4, T3FREE, THYROIDAB in the last 72 hours. Anemia Panel: No results for input(s): VITAMINB12, FOLATE, FERRITIN, TIBC, IRON, RETICCTPCT in the last 72 hours. Urine analysis:    Component Value Date/Time   COLORURINE YELLOW 11/15/2018 2145   APPEARANCEUR CLEAR 11/15/2018 2145   LABSPEC 1.017 11/15/2018 2145   PHURINE 5.0 11/15/2018 2145   GLUCOSEU 50 (A) 11/15/2018 2145   HGBUR NEGATIVE 11/15/2018 2145   BILIRUBINUR NEGATIVE 11/15/2018 2145   KETONESUR NEGATIVE 11/15/2018 2145   PROTEINUR NEGATIVE 11/15/2018 2145   UROBILINOGEN 0.2 09/05/2015 1531   NITRITE NEGATIVE 11/15/2018 2145   LEUKOCYTESUR NEGATIVE 11/15/2018 2145   Sepsis Labs: @LABRCNTIP (procalcitonin:4,lacticidven:4) )No results found for this or any previous visit (from the past 240 hour(s)).   Radiological Exams on Admission: Ct Head Wo Contrast  Result Date: 04/17/2019 CLINICAL DATA:  New onset of confusion.  Blood thinners. EXAM: CT HEAD WITHOUT CONTRAST TECHNIQUE: Contiguous axial images were obtained from the base of the skull through the vertex without intravenous contrast. COMPARISON:  April 17, 2019 FINDINGS: Brain: No subdural, epidural, or subarachnoid hemorrhage. Stable left superior cerebellar infarct. Cerebellum, brainstem, and basal cisterns are otherwise normal. Scattered white matter changes are noted. Ventricles and sulci are unremarkable. No mass effect or midline shift. No acute cortical ischemia or infarct. Vascular: No hyperdense vessel or unexpected calcification. Skull: Normal. Negative for fracture or focal lesion. Sinuses/Orbits: No acute finding. Other: None. IMPRESSION: 1. No acute intracranial abnormalities. Scattered white matter changes. Chronic left cerebellar infarct. No intracranial hemorrhage noted. Electronically Signed   By: Gerome Sam III M.D   On: 04/17/2019 13:18     Ct Head Wo Contrast  Result Date: 04/17/2019 CLINICAL DATA:  Unwitnessed fall.  EXAM: CT HEAD WITHOUT CONTRAST TECHNIQUE: Contiguous axial images were obtained from the base of the skull through the vertex without intravenous contrast. COMPARISON:  Head CT and MRI 11/15/2018 FINDINGS: Brain: There is no evidence of acute infarct, intracranial hemorrhage, mass, midline shift, or extra-axial fluid collection. Mild cerebral atrophy is within normal limits for age. A chronic superior left cerebellar infarct is unchanged. Patchy cerebral white matter hypodensities are unchanged and nonspecific but compatible with moderate chronic small vessel ischemic disease. Vascular: Calcified atherosclerosis at the skull base. No hyperdense vessel. Skull: No fracture or focal osseous lesion. Sinuses/Orbits: Visualized paranasal sinuses and mastoid air cells are clear. Bilateral cataract extraction is noted. Other: None. IMPRESSION: 1. No evidence of acute intracranial abnormality. 2. Moderate chronic small vessel ischemic disease. Electronically Signed   By: Sebastian Ache M.D.   On: 04/17/2019 11:36   Dg Chest Port 1 View  Result Date: 04/17/2019 CLINICAL DATA:  Unwitnessed fall. EXAM: PORTABLE CHEST 1 VIEW COMPARISON:  March 24, 2006 FINDINGS: The heart size and mediastinal contours are within normal limits. Both lungs are clear. The visualized skeletal structures are unremarkable. IMPRESSION: No active disease. Electronically Signed   By: Gerome Sam III M.D   On: 04/17/2019 11:32   Dg Hip Unilat W Or Wo Pelvis 2-3 Views Left  Result Date: 04/17/2019 CLINICAL DATA:  LEFT hip pain EXAM: DG HIP (WITH OR WITHOUT PELVIS) 2-3V LEFT COMPARISON:  None. FINDINGS: There is shortening of the LEFT femoral neck. There is linear superimposition of bone density along the inferior margin. These findings are consistent with LEFT femoral neck fracture. Fracture appears to be subcapital in location. No significant angulation. There  is bilateral joint space narrowing of the hip joints. Sclerosis of the acetabular roofs. IMPRESSION: 1. Subcapital LEFT femoral neck fracture. 2. Bilateral osteoarthritis of the hip joints. Electronically Signed   By: Genevive Bi M.D.   On: 04/17/2019 11:34    EKG: Independently reviewed. NSR  Assessment/Plan Principal Problem:   S/p left hip fracture - per April Doreene Adas note, she will have a total hip replacement by April Berton Lan who is on call on Monday after holding Pradaxa Pain control-  - have also ordered Tylenol, Toradol, Flexeril. Morphine can be used if all other measure do not control her pain - she likely took her Pradaxa this AM- can start DVT prophylaxis tomorrow as she is not imminently having surgery - moderate risk for surgery   Active Problems: Acute confusion - cause undetermined- her son states she is sensitive to narcotics but as far as we know, she has no received any today other than the Fentanyl which was given after she was found confused - UA pending   - Addendum: spoke with April Alcide Evener- we have decided to hold off on an MRI as it would not change our current management- will do neuro-checks, cont to hold Pradaxa for surgery- I have ordered Flexeril for pain which she takes at home- hold Ultracet as she does not home (per her daughter)- use PRN Tylenol and PRN Toradol 15 mg for now- if pain remains severe and uncontrolled, can use Morphine 2 mg PRN - of note, her prior Migraines have presented with word finding difficulties but not confusion  Arthritis/ chronic pain - med list mentions she takes Ultracet (does not take at home- will remove it) and Flexeril PRN which she does take  DM2 - on Lantus 46 U and Victoza 1.8 mg daily - have reduced the Lantus dose to 20  U and added SSI    Coronary artery disease involving coronary bypass graft of native heart with angina pectoris  - on Statin - resume    Atrial flutter - currently NSR- hold Pradaxa    Essential  hypertension, benign - on Coreg and Lisinopril at home - I have reduced to dose of Coreg to 12.5 mg as she may become hypotensive if she receives narcotic pain medications  - hold Lisinopril  Complicated migraines - apparently resolved after Metformin was held    Insomnia?  - she takes PRn Klonopin at home in addition to routine Trazodone 25 mg QHS - continue   DVT prophylaxis: Pradaxa > start Lovenox tomorrow  Code Status: Full code Family Communication: son, Mancel Bale MD  Disposition Plan: admit to tele  Consults called: ortho, April Aundria Rud who spoke with ED physician  Admission status: inpatient    Calvert Cantor MD [facility] Pager: [email or web address] Password [email or web address] 7PM-7AM, please contact night-coverage   04/17/2019, 2:19 PM

## 2019-04-17 NOTE — ED Provider Notes (Addendum)
[facility] DEPT Provider Note   CSN: [id number] Arrival date & time: 04/17/19  1033    History   Chief Complaint Chief Complaint  Patient presents with   Fall   Hip Pain    left    HPI [name] is a 85 y.o. female.     HPI   85 yo F with PMHx as below including HTN, CAD, vertigo, AFib on Pradaxa, here with fall. Pt states she had been feeling well until this AM. She has a h/o vertigo and turned her head while making breakfast, causing her to get dizzy. She fell and hit her left hip on the ground. Does not believe she hit her head, denies any HA, neck pain, CP, arm pain. She now has 8/10, aching, throbbing, L hip pain that is worse w/ movement, palpation. She was unable to get herself up, had a home health aid that called 911. No h/o prior injury to hip. She states her dizziness was similar to her chronic vertigo, denies any new numbness, weakness, slurred speech, or other complaints.   Past Medical History:  Diagnosis Date   Anginal pain ([place])    "saw doctor-think it was esophagus spasm"   Arthritis    Atrial flutter ([place]) 11/02/2013   Cataract    left eye   Coronary artery disease    Diabetes mellitus without complication ([place])    type 2   Essential hypertension, benign 11/02/2013   H/O jaundice    as child   Headache 3267   complicated migraine x2    Hearing trouble    Heart disease    Hepatitis    hx of hepatitis A as child   High cholesterol    under control   Long term (current) use of anticoagulants 11/02/2013   Pradaxa    Measles    as child   Mouth dryness    Mumps    as teenager   Numbness and tingling    lips   Osteoporosis    "unsure-maybe in knees"   Psoriasis    Psoriatic arthritis ([place])    Stroke ([place]) 2012   Vertigo    being monitored, occasional    Patient Active Problem List   Diagnosis Date Noted   OA (osteoarthritis) of knee 09/11/2015   BPPV (benign paroxysmal  positional vertigo) 12/45/8099   Complicated migraine 83/38/2505   TIA (transient ischemic attack) 08/04/2014   Coronary artery disease involving coronary bypass graft of native heart with angina pectoris ([place]) 11/02/2013    Class: Chronic   Atrial flutter ([place]) 11/02/2013    Class: Chronic   Essential hypertension, benign 11/02/2013   Long term current use of anticoagulant therapy 11/02/2013    Class: Acute    Past Surgical History:  Procedure Laterality Date   APPENDECTOMY  as child   CARDIAC CATHETERIZATION  ~2006   clear   CORONARY ARTERY BYPASS GRAFT  02/16/2005   LUMBAR [place] SURGERY  09/2004   L1-L5   TONSILLECTOMY  as child   with adenoids   TOTAL KNEE ARTHROPLASTY Left 09/11/2015   Procedure: LEFT TOTAL KNEE ARTHROPLASTY;  Surgeon: Gaynelle Arabian, MD;  Location: [facility];  Service: Orthopedics;  Laterality: Left;     OB History   No obstetric history on file.      Home Medications    Prior to Admission medications   Medication Sig Start Date End Date Taking? Authorizing Provider  acetaminophen (TYLENOL) 500 MG tablet Take 500 mg  by mouth every 6 (six) hours as needed for mild pain.    [provider]  calcium elemental as carbonate (TUMS ULTRA 1000) 400 MG chewable tablet Chew 1,000 mg by mouth every evening.    [provider]  carvedilol (COREG) 25 MG tablet TAKE (1) TABLET TWICE A DAY WITH FOOD. 10/05/18   Belva Crome, MD  cholecalciferol (VITAMIN D) 1000 units tablet Take 2,000 Units by mouth daily.    [provider]  clobetasol cream (TEMOVATE) 3.14 % Apply 1 application topically 2 (two) times daily as needed (skin irritation).    [provider]  clonazePAM (KLONOPIN) 0.5 MG tablet Take 0.5 mg by mouth at bedtime as needed (for sleep).  06/16/12   [provider]  cyclobenzaprine (FLEXERIL) 10 MG tablet Take 10 mg by mouth 3 (three) times daily as needed for muscle spasms.    [provider]    LANTUS SOLOSTAR 100 UNIT/ML Solostar Pen Inject 46 Units into the skin every morning.  10/19/15   [provider]  Loratadine (CLARITIN PO) Take 1 tablet by mouth daily.    [provider]  nitroGLYCERIN (NITROSTAT) 0.4 MG SL tablet Place 0.4 mg under the tongue every 5 (five) minutes x 3 doses as needed for chest pain.    [provider]  nystatin-triamcinolone ointment (MYCOLOG) Apply 1 application topically as directed.  10/03/14   [provider]  PRADAXA 150 MG CAPS Take 150 mg by mouth 2 (two) times daily.  07/18/12   [provider]  Probiotic Product (ALIGN PO) Take 1 capsule by mouth daily.    [provider]  ramipril (ALTACE) 5 MG capsule Take 1 capsule (5 mg total) by mouth 2 (two) times daily. 07/09/18   Belva Crome, MD  rosuvastatin (CRESTOR) 10 MG tablet Take 10 mg by mouth at bedtime.     [provider]  senna (SENOKOT) 8.6 MG tablet Take 1 tablet by mouth at bedtime as needed for constipation.    [provider]  traMADol-acetaminophen (ULTRACET) 37.5-325 MG tablet Take 1-2 tablets by mouth as directed. 01/01/19   [provider]  traZODone (DESYREL) 50 MG tablet Take 25 mg by mouth at bedtime. 10/30/18   [provider]  VICTOZA 18 MG/3ML SOPN Inject 1.8 mg into the skin as directed.  01/07/19   [provider]  vitamin B-12 (CYANOCOBALAMIN) 250 MCG tablet Take 250 mcg by mouth daily.    [provider]    Family History Family History  Problem Relation Age of Onset   Heart failure Mother    Neuromuscular disorder Father     Social History Social History   Tobacco Use   Smoking status: Never Smoker   Smokeless tobacco: Never Used  Substance Use Topics   Alcohol use: No    Alcohol/week: 0.0 standard drinks   Drug use: No     Allergies   Codeine; Penicillins; Atenolol; Atorvastatin; Metoprolol; and Zetia [ezetimibe]   Review of Systems Review of  Systems  Constitutional: Negative for chills, fatigue and fever.  HENT: Negative for congestion and rhinorrhea.   Eyes: Negative for visual disturbance.  Respiratory: Negative for cough, shortness of breath and wheezing.   Cardiovascular: Negative for chest pain and leg swelling.  Gastrointestinal: Negative for abdominal pain, diarrhea, nausea and vomiting.  Genitourinary: Negative for dysuria and flank pain.  Musculoskeletal: Positive for arthralgias and gait problem. Negative for neck pain and neck stiffness.  Skin: Negative for rash  and wound.  Allergic/Immunologic: Negative for immunocompromised state.  Neurological: Negative for syncope, weakness and headaches.  All other systems reviewed and are negative.    Physical Exam Updated Vital Signs BP (!) 159/78    Pulse 94    Temp 98.4 F (36.9 C) (Oral)    Resp 14    SpO2 98%   Physical Exam Vitals signs and nursing note reviewed.  Constitutional:      General: She is not in acute distress.    Appearance: She is well-developed.  HENT:     Head: Normocephalic and atraumatic.     Comments: No apparent head or neck trauma. No midline C-Spine TTP. Eyes:     Conjunctiva/sclera: Conjunctivae normal.  Neck:     Musculoskeletal: Neck supple.  Cardiovascular:     Rate and Rhythm: Normal rate and regular rhythm.     Heart sounds: Normal heart sounds.  Pulmonary:     Effort: Pulmonary effort is normal. No respiratory distress.     Breath sounds: No wheezing.  Abdominal:     General: There is no distension.  Skin:    General: Skin is warm.     Capillary Refill: Capillary refill takes less than 2 seconds.     Findings: No rash.  Neurological:     Mental Status: She is alert and oriented to person, place, and time.     Motor: No abnormal muscle tone.     LOWER EXTREMITY EXAM: LEFT  INSPECTION & PALPATION: Moderate TTP over left greater trochanter w/ pain upon passive log roll and movement. No open wounds.  SENSORY:  sensation is intact to light touch in:  Superficial peroneal nerve distribution (over dorsum of foot) Deep peroneal nerve distribution (over first dorsal web space) Sural nerve distribution (over lateral aspect 5th metatarsal) Saphenous nerve distribution (over medial instep)  MOTOR:  + Motor EHL (great toe dorsiflexion) + FHL (great toe plantar flexion)  + TA (ankle dorsiflexion)  + GSC (ankle plantar flexion)  VASCULAR: 2+ dorsalis pedis and posterior tibialis pulses Capillary refill < 2 sec, toes warm and well-perfused  COMPARTMENTS: Soft, warm, well-perfused No pain with passive extension No parethesias    ED Treatments / Results  Labs (all labs ordered are listed, but only abnormal results are displayed) Labs Reviewed  CBC WITH DIFFERENTIAL/PLATELET - Abnormal; Notable for the following components:      Result Value   Neutro Abs 7.8 (*)    All other components within normal limits  BASIC METABOLIC PANEL - Abnormal; Notable for the following components:   Glucose, Bld 179 (*)    Calcium 8.6 (*)    All other components within normal limits  CBG MONITORING, ED - Abnormal; Notable for the following components:   Glucose-Capillary 176 (*)    All other components within normal limits  BLOOD GAS, VENOUS  TROPONIN I  LACTIC ACID, PLASMA  LACTIC ACID, PLASMA  PROTIME-INR  URINALYSIS, ROUTINE W REFLEX MICROSCOPIC  TYPE AND SCREEN    EKG EKG Interpretation  Date/Time:  Saturday April 17 2019 11:57:17 EDT Ventricular Rate:  92 PR Interval:    QRS Duration: 98 QT Interval:  342 QTC Calculation: 423 R Axis:   -26 Text Interpretation:  Sinus rhythm Prolonged PR interval Inferior infarct, old Anteroseptal infarct, old Lateral leads are also involved since last tracing no significant change Confirmed by Daleen Bo [id number]) on 04/17/2019 12:07:59 PM   Radiology Ct Head Wo Contrast  Result Date: 04/17/2019 CLINICAL DATA:  Unwitnessed fall. EXAM:  CT HEAD WITHOUT  CONTRAST TECHNIQUE: Contiguous axial images were obtained from the base of the skull through the vertex without intravenous contrast. COMPARISON:  Head CT and MRI 11/15/2018 FINDINGS: Brain: There is no evidence of acute infarct, intracranial hemorrhage, mass, midline shift, or extra-axial fluid collection. Mild cerebral atrophy is within normal limits for age. A chronic superior left cerebellar infarct is unchanged. Patchy cerebral white matter hypodensities are unchanged and nonspecific but compatible with moderate chronic small vessel ischemic disease. Vascular: Calcified atherosclerosis at the skull base. No hyperdense vessel. Skull: No fracture or focal osseous lesion. Sinuses/Orbits: Visualized paranasal sinuses and mastoid air cells are clear. Bilateral cataract extraction is noted. Other: None. IMPRESSION: 1. No evidence of acute intracranial abnormality. 2. Moderate chronic small vessel ischemic disease. Electronically Signed   By: Logan Bores M.D.   On: 04/17/2019 11:36   Dg Chest Port 1 View  Result Date: 04/17/2019 CLINICAL DATA:  Unwitnessed fall. EXAM: PORTABLE CHEST 1 VIEW COMPARISON:  March 24, 2006 FINDINGS: The heart size and mediastinal contours are within normal limits. Both lungs are clear. The visualized skeletal structures are unremarkable. IMPRESSION: No active disease. Electronically Signed   By: Dorise Bullion III M.D   On: 04/17/2019 11:32   Dg Hip Unilat W Or Wo Pelvis 2-3 Views Left  Result Date: 04/17/2019 CLINICAL DATA:  LEFT hip pain EXAM: DG HIP (WITH OR WITHOUT PELVIS) 2-3V LEFT COMPARISON:  None. FINDINGS: There is shortening of the LEFT femoral neck. There is linear superimposition of bone density along the inferior margin. These findings are consistent with LEFT femoral neck fracture. Fracture appears to be subcapital in location. No significant angulation. There is bilateral joint space narrowing of the hip joints. Sclerosis of the acetabular roofs. IMPRESSION: 1.  Subcapital LEFT femoral neck fracture. 2. Bilateral osteoarthritis of the hip joints. Electronically Signed   By: Suzy Bouchard M.D.   On: 04/17/2019 11:34    Procedures Procedures (including critical care time)  Medications Ordered in ED Medications  magnesium sulfate IVPB 2 g 50 mL (has no administration in time range)  0.9 %  sodium chloride infusion (has no administration in time range)  acetaminophen (TYLENOL) tablet 1,000 mg (has no administration in time range)     Initial Impression / Assessment and Plan / ED Course  I have reviewed the triage vital signs and the nursing notes.  Pertinent labs & imaging results that were available during my care of the patient were reviewed by me and considered in my medical decision making (see chart for details).        85 yo F here w/ fall, left hip pain. Initial imaging is c/f left subcapital hip fx. She reportedly was well prior to the fall and CBC, BMP, unremarkable. Of note, shortly after arriving, pt noted to be more confused w/ repetitive questioning. While this could represent concussion vs mild delirium in setting of pain (though time course is acute for this), given her pradaxa use repeat CT obtained and fortunately shows no blood. VBG normal. CBG wnl. Will tx pain, admit. Dr. Stann Mainland of [facility] to see. [facility] on Monday to allow pradaxa wash out.  Discussed with pt's son, Dr. Benay Spice.  Of note, due to h/o complex migraines w/ confusion and no rebleed on CT, with no alternative apparent dx of confusion, will trial a dose of mag for migraine relief. Will hold on compazine or other sedating meds at this time.  Final Clinical Impressions(s) / ED Diagnoses  Final diagnoses:  Closed subcapital fracture of femur, left, initial encounter [facility])    ED Discharge Orders    None       Duffy Bruce, MD 04/17/19 1319    Duffy Bruce, MD 04/17/19 1340

## 2019-04-17 NOTE — Consult Note (Signed)
ORTHOPAEDIC CONSULTATION  REQUESTING PHYSICIAN: Debbe Odea, MD  PCP:  Lajean Manes, MD  Chief Complaint: Fall  HPI: [name] is a 85 y.o. female who complains of left hip pain following a ground-level fall prior to presentation.  She presented to the  [facility] emergency department.  While there on her initial and secondary survey she was noted to have left hip pain and confirmed femoral neck fracture on x-rays.  She is an established patient of my partner Dr. Gaynelle Arabian, and I was called for inpatient consultation and management of the above fracture.   She does seem to have a little bit of confusion on exam which has been ongoing for the last couple of months intermittently.  She has had a neurologic work-up that was essentially negative.  She is on Pradaxa for atrial fibrillation.  Due to some of the confusion the subjective component of this exam was obtained via chart review, discussion with the emergency department provider, and the hospitalist, and in conversation directly with her son Dr. Julieanne Manson.   She currently does not endorse any numbness or tingling.  She does not endorse any other injuries at this point.  She does live independently with her husband whom she helps provide daily care of.  She does not use assistive devices for ambulation at baseline. Past Medical History:  Diagnosis Date   Anginal pain ([place])    "saw doctor-think it was esophagus spasm"   Arthritis    Atrial flutter ([place]) 11/02/2013   Cataract    left eye   Coronary artery disease    Diabetes mellitus without complication ([place])    type 2   Essential hypertension, benign 11/02/2013   H/O jaundice    as child   Headache 9735   complicated migraine x2    Hearing trouble    Heart disease    Hepatitis    hx of hepatitis A as child   High cholesterol    under control   Long term (current) use of anticoagulants 11/02/2013   Pradaxa    Measles    as child   Mouth  dryness    Mumps    as teenager   Numbness and tingling    lips   Osteoporosis    "unsure-maybe in knees"   Psoriasis    Psoriatic arthritis ([place])    Stroke ([place]) 2012   Vertigo    being monitored, occasional   Past Surgical History:  Procedure Laterality Date   APPENDECTOMY  as child   CARDIAC CATHETERIZATION  ~2006   clear   CORONARY ARTERY BYPASS GRAFT  02/16/2005   LUMBAR [place] SURGERY  09/2004   L1-L5   TONSILLECTOMY  as child   with adenoids   TOTAL KNEE ARTHROPLASTY Left 09/11/2015   Procedure: LEFT TOTAL KNEE ARTHROPLASTY;  Surgeon: Gaynelle Arabian, MD;  Location: [facility];  Service: Orthopedics;  Laterality: Left;   Social History   Socioeconomic History   Marital status: Married    Spouse name: Not on file   Number of children: 3   Years of education: BS   Highest education level: Not on file  Occupational History   Not on file  Social Needs   Financial resource strain: Not on file   Food insecurity:    Worry: Not on file    Inability: Not on file   Transportation needs:    Medical: Not on file    Non-medical: Not on file  Tobacco Use  Smoking status: Never Smoker   Smokeless tobacco: Never Used  Substance and Sexual Activity   Alcohol use: No    Alcohol/week: 0.0 standard drinks   Drug use: No   Sexual activity: Not on file  Lifestyle   Physical activity:    Days per week: Not on file    Minutes per session: Not on file   Stress: Not on file  Relationships   Social connections:    Talks on phone: Not on file    Gets together: Not on file    Attends religious service: Not on file    Active member of club or organization: Not on file    Attends meetings of clubs or organizations: Not on file    Relationship status: Not on file  Other Topics Concern   Not on file  Social History Narrative   Patient is married with 3 children.   Patient is left handed.   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   Patient  drinks 3 cups daily.   Family History  Problem Relation Age of Onset   Heart failure Mother    Neuromuscular disorder Father    Allergies  Allergen Reactions   Codeine Nausea And Vomiting   Penicillins Swelling and Other (See Comments)    Swelling of tongue   Atenolol Other (See Comments)   Atorvastatin     Muscle soreness   Metoprolol Other (See Comments)   Zetia [Ezetimibe] Other (See Comments)   Prior to Admission medications   Medication Sig Start Date End Date Taking? Authorizing Provider  acetaminophen (TYLENOL) 500 MG tablet Take 500 mg by mouth every 6 (six) hours as needed for mild pain.    [provider]  calcium elemental as carbonate (TUMS ULTRA 1000) 400 MG chewable tablet Chew 1,000 mg by mouth every evening.    [provider]  carvedilol (COREG) 25 MG tablet TAKE (1) TABLET TWICE A DAY WITH FOOD. 10/05/18   Belva Crome, MD  cholecalciferol (VITAMIN D) 1000 units tablet Take 2,000 Units by mouth daily.    [provider]  clobetasol cream (TEMOVATE) 1.61 % Apply 1 application topically 2 (two) times daily as needed (skin irritation).    [provider]  clonazePAM (KLONOPIN) 0.5 MG tablet Take 0.5 mg by mouth at bedtime as needed (for sleep).  06/16/12   [provider]  cyclobenzaprine (FLEXERIL) 10 MG tablet Take 10 mg by mouth 3 (three) times daily as needed for muscle spasms.    [provider]  LANTUS SOLOSTAR 100 UNIT/ML Solostar Pen Inject 46 Units into the skin every morning.  10/19/15   [provider]  Loratadine (CLARITIN PO) Take 1 tablet by mouth daily.    [provider]  nitroGLYCERIN (NITROSTAT) 0.4 MG SL tablet Place 0.4 mg under the tongue every 5 (five) minutes x 3 doses as needed for chest pain.    [provider]  nystatin-triamcinolone ointment (MYCOLOG) Apply 1 application topically as directed.  10/03/14   [provider]  PRADAXA 150 MG CAPS Take  150 mg by mouth 2 (two) times daily.  07/18/12   [provider]  Probiotic Product (ALIGN PO) Take 1 capsule by mouth daily.    [provider]  ramipril (ALTACE) 5 MG capsule Take 1 capsule (5 mg total) by mouth 2 (two) times daily. 07/09/18   Belva Crome, MD  rosuvastatin (CRESTOR) 10 MG tablet Take 10 mg by mouth at bedtime.  [provider]  senna (SENOKOT) 8.6 MG tablet Take 1 tablet by mouth at bedtime as needed for constipation.    [provider]  traZODone (DESYREL) 50 MG tablet Take 25 mg by mouth at bedtime. 10/30/18   [provider]  VICTOZA 18 MG/3ML SOPN Inject 1.8 mg into the skin as directed.  01/07/19   [provider]  vitamin B-12 (CYANOCOBALAMIN) 250 MCG tablet Take 250 mcg by mouth daily.    [provider]   Ct Head Wo Contrast  Result Date: 04/17/2019 CLINICAL DATA:  New onset of confusion.  Blood thinners. EXAM: CT HEAD WITHOUT CONTRAST TECHNIQUE: Contiguous axial images were obtained from the base of the skull through the vertex without intravenous contrast. COMPARISON:  April 17, 2019 FINDINGS: Brain: No subdural, epidural, or subarachnoid hemorrhage. Stable left superior cerebellar infarct. Cerebellum, brainstem, and basal cisterns are otherwise normal. Scattered white matter changes are noted. Ventricles and sulci are unremarkable. No mass effect or midline shift. No acute cortical ischemia or infarct. Vascular: No hyperdense vessel or unexpected calcification. Skull: Normal. Negative for fracture or focal lesion. Sinuses/Orbits: No acute finding. Other: None. IMPRESSION: 1. No acute intracranial abnormalities. Scattered white matter changes. Chronic left cerebellar infarct. No intracranial hemorrhage noted. Electronically Signed   By: Dorise Bullion III M.D   On: 04/17/2019 13:18   Ct Head Wo Contrast  Result Date: 04/17/2019 CLINICAL DATA:  Unwitnessed fall. EXAM: CT HEAD WITHOUT CONTRAST TECHNIQUE:  Contiguous axial images were obtained from the base of the skull through the vertex without intravenous contrast. COMPARISON:  Head CT and MRI 11/15/2018 FINDINGS: Brain: There is no evidence of acute infarct, intracranial hemorrhage, mass, midline shift, or extra-axial fluid collection. Mild cerebral atrophy is within normal limits for age. A chronic superior left cerebellar infarct is unchanged. Patchy cerebral white matter hypodensities are unchanged and nonspecific but compatible with moderate chronic small vessel ischemic disease. Vascular: Calcified atherosclerosis at the skull base. No hyperdense vessel. Skull: No fracture or focal osseous lesion. Sinuses/Orbits: Visualized paranasal sinuses and mastoid air cells are clear. Bilateral cataract extraction is noted. Other: None. IMPRESSION: 1. No evidence of acute intracranial abnormality. 2. Moderate chronic small vessel ischemic disease. Electronically Signed   By: Logan Bores M.D.   On: 04/17/2019 11:36   Dg Chest Port 1 View  Result Date: 04/17/2019 CLINICAL DATA:  Unwitnessed fall. EXAM: PORTABLE CHEST 1 VIEW COMPARISON:  March 24, 2006 FINDINGS: The heart size and mediastinal contours are within normal limits. Both lungs are clear. The visualized skeletal structures are unremarkable. IMPRESSION: No active disease. Electronically Signed   By: Dorise Bullion III M.D   On: 04/17/2019 11:32   Dg Hip Unilat W Or Wo Pelvis 2-3 Views Left  Result Date: 04/17/2019 CLINICAL DATA:  LEFT hip pain EXAM: DG HIP (WITH OR WITHOUT PELVIS) 2-3V LEFT COMPARISON:  None. FINDINGS: There is shortening of the LEFT femoral neck. There is linear superimposition of bone density along the inferior margin. These findings are consistent with LEFT femoral neck fracture. Fracture appears to be subcapital in location. No significant angulation. There is bilateral joint space narrowing of the hip joints. Sclerosis of the acetabular roofs. IMPRESSION: 1. Subcapital LEFT femoral  neck fracture. 2. Bilateral osteoarthritis of the hip joints. Electronically Signed   By: Suzy Bouchard M.D.   On: 04/17/2019 11:34    Positive ROS: All other systems have been reviewed and were otherwise negative with the exception of those mentioned in the HPI and  as above.  Physical Exam: General: Alert, no acute distress.   Cardiovascular: No pedal edema Respiratory: No cyanosis, no use of accessory musculature GI: No organomegaly, abdomen is soft and non-tender Skin: No lesions in the area of chief complaint Neurologic: Sensation intact distally Psychiatric: Patient is having some confusion at this point and difficulty with word finding. Lymphatic: No axillary or cervical lymphadenopathy  MUSCULOSKELETAL:  On my secondary survey she has no tenderness noted on the bilateral upper extremities, cervical spine, pelvis, or right lower extremity.   The left lower extremity is held in external rotation and slightly shortened.  She is neurovascularly intact throughout.  Assessment: Left closed intracapsular hip fracture.  Plan: -I reviewed the radiographs and discussed the findings with the patient and her son.  At this time the recommendation will be for a total hip arthroplasty due to the intracapsular nature of this fracture.  Due to her being on Pradaxa we would recommend waiting at least 48 hours from last dose which will put her into Monday morning.  -I agree with the current pain regimen established by the hospitalist.  We would like to avoid narcotics due to the confusion.  She seems to be clearing a bit per her son while we examined her in the room.  -My partner Dr. Wynelle Link is available and will be performing the surgery on Monday morning.  He is her established orthopedic surgeon.  - NPO Sunday night please.    Nicholes Stairs, MD Cell [phone]    04/17/2019 5:11 PM

## 2019-04-17 NOTE — ED Notes (Signed)
Lab called and states they need a recollect on blue top

## 2019-04-17 NOTE — ED Triage Notes (Signed)
Pt BIBA from home, unwitnessed fall.  C/o left hip pain.  Denies hitting head, denies cervical pain.    AOx4, ambulatory at baseline, unable to walk at this time d/t pain 10/10.    20 g L FA.

## 2019-04-17 NOTE — Consult Note (Addendum)
[facility] Tele[facility]urology Consult Services   Date of Service:   04/17/2019 20:13:32  Impression:     .  Rule Out Acute Ischemic Stroke  Comments/Sign-Out: acute onset confusion and lethargy - encephalopathy favored over stroke. She is not a tpa candidate due to LSN >4.5 hours, pradaxa taken in the last 48 hours, and PT >15. CTA head/neck pending.  Mechanism of Stroke: Possible Thromboembolic Possible Cardioembolic Small Vessel Disease  Metrics: Last Known Well: 04/17/2019 12:00:00 [facility] Notification Time: 04/17/2019 20:13:32 Stamp Time: 04/17/2019 20:13:32 Time First Login Attempt: 04/17/2019 20:22:09 Video Start Time: 04/17/2019 20:22:09  Symptoms: confusion; lethargy. NIHSS Start Assessment Time: 04/17/2019 20:40:44 Patient is not a candidate for tPA. Patient was not deemed candidate for tPA thrombolytics because of Last Well Known Above 4.5 Hours. Video End Time: 04/17/2019 20:46:08  CT head showed no acute hemorrhage or acute core infarct.  Lower Likelihood of Large Vessel Occlusion but Following Stat Studies are Recommended  CTA Head and Neck CTA head negative; no role for thrombectomy.    Our recommendations are outlined below.  Recommendations:     .  Activate Stroke Protocol Admission/Order Set     .  Stroke/Telemetry Floor     .  Neuro Checks     .  Bedside Swallow Eval     .  DVT Prophylaxis     .  IV Fluids, Normal Saline     .  Head of Bed Below 30 Degrees     .  Euglycemia and Avoid Hyperthermia (PRN Acetaminophen)     .  start ASA if CT head is neg for hemorrhage. hold pradaxa for now to reduce risk of hemorrhagic conversion.  Routine Consultation with [facility] for Follow up Care  Sign Out:     .  Discussed with Emergency Department Provider    ------------------------------------------------------------------------------  History of Present Illness: Patient is a 85 year old Female.  Inpatient stroke  alert was called for symptoms of confusion; lethargy.  85 yo woman with unwitnessed fall and femur fracture. She was noted to be confused and lethargic at approx 1730. LSN unclear but it started by 1200 today as evidenced by the CT head ordered for confusion. She has a hx of migraines. She takes pradaxa for afib/aflutter - last dose unclear. HR 101 BP 159/73 T 98.6 Sat 97% on 2L BG 157  CT head showed no acute hemorrhage or acute core infarct.    Examination: 1A: Level of Consciousness - Arouses to minor stimulation + 1 1B: Ask Month and Age - Could Not Answer Either Question Correctly + 2 1C: Blink Eyes & Squeeze Hands - Performs Both Tasks + 0 2: Test Horizontal Extraocular Movements - Normal + 0 3: Test Visual Fields - No Visual Loss + 0 4: Test Facial Palsy (Use Grimace if Obtunded) - Normal symmetry + 0 5A: Test Left Arm Motor Drift - No Drift for 10 Seconds + 0 5B: Test Right Arm Motor Drift - Drift, but doesn't hit bed + 1 6A: Test Left Leg Motor Drift - No Drift for 5 Seconds + 0 6B: Test Right Leg Motor Drift - No Effort Against Gravity + 3 7: Test Limb Ataxia (FNF/Heel-Shin) - No Ataxia + 0 8: Test Sensation - Normal; No sensory loss + 0 9: Test Language/Aphasia - Mild-Moderate Aphasia: Some Obvious Changes, Without Significant Limitation + 1 10: Test Dysarthria - Mild-Moderate Dysarthria: Slurring but can be understood + 1 11: Test Extinction/Inattention - No abnormality + 0  NIHSS Score: 9  Patient was informed the Neurology Consult would happen via TeleHealth consult by way of interactive audio and video telecommunications and consented to receiving care in this manner.  Due to the immediate potential for life-threatening deterioration due to underlying acute neurologic illness, I spent 35 minutes providing critical care. This time includes time for face to face visit via telemedicine, review of medical records, imaging studies and discussion of findings with providers, the  patient and/or family.   Dr Hal Morales   [facility] [phone]  Case [id number]

## 2019-04-17 NOTE — Progress Notes (Addendum)
Paged by Dr. Wynelle Cleveland regarding pts acute confusion and mental status changes. Pt admitted with fall and left hip fracture. Attending spoke with Neurology who recommended calling a code stroke if pt was still confused this evening and having tele-neuro formally consult. Previous STAT head CT showed "no acute intracranial abnormalities."   Upon entering room pt resting and in NAD. Oriented to just person and place stating she was at "[facility]." MOE x4 with strong grips bilaterally. No drift noted in BUE or RLE. Unable to assess LLE due to L Hip fracture. Face symmetrical and tongue midline. CBG was 157. Code Stroke initiated and pt will go for a Stat Head CT and CTA of the Head as well. We will transfer her to stepdown for further monitoring as well.  Spoke with the pts son Dr. Learta Codding and updated him on her current status as well as the plan.   Arby Barrette AGPCNP-BC, AGNP-C [facility] Pager [phone]

## 2019-04-17 NOTE — ED Notes (Signed)
ED TO INPATIENT HANDOFF REPORT  ED Nurse Name and Phone #: [phone] Cari  S Name/Age/Gender [name] 85 y.o. female Room/Bed: WA23/WA23  Code Status   Code Status: Full Code  Home/SNF/Other Home Patient oriented to: self Is this baseline? No   Triage Complete: Triage complete  Chief Complaint fall; hip injury  Triage Note Pt BIBA from home, unwitnessed fall.  C/o left hip pain.  Denies hitting head, denies cervical pain.    AOx4, ambulatory at baseline, unable to walk at this time d/t pain 10/10.    20 g L FA.    Allergies Allergies  Allergen Reactions  . Codeine Nausea And Vomiting  . Penicillins Swelling and Other (See Comments)    Swelling of tongue  . Atenolol Other (See Comments)  . Atorvastatin     Muscle soreness  . Metoprolol Other (See Comments)  . Zetia [Ezetimibe] Other (See Comments)    Level of Care/Admitting Diagnosis ED Disposition    ED Disposition Condition Comment   Admit  Hospital Area: [place] [[id number]]  Level of Care: Med-Surg [16]  Covid Evaluation: N/A  Diagnosis: S/p left hip fracture [[id number]]  Admitting Physician: Jennings, Lanare  Attending Physician: Debbe Odea [[id number]]  Estimated length of stay: past midnight tomorrow  Certification:: I certify this patient will need inpatient services for at least 2 midnights  PT Class (Do Not Modify): Inpatient [101]  PT Acc Code (Do Not Modify): Private [1]       B Medical/Surgery History Past Medical History:  Diagnosis Date  . Anginal pain ([place])    "saw doctor-think it was esophagus spasm"  . Arthritis   . Atrial flutter ([place]) 11/02/2013  . Cataract    left eye  . Coronary artery disease   . Diabetes mellitus without complication ([place])    type 2  . Essential hypertension, benign 11/02/2013  . H/O jaundice    as child  . Headache 9767   complicated migraine x2   . Hearing trouble   . Heart disease   . Hepatitis    hx of hepatitis A as  child  . High cholesterol    under control  . Long term (current) use of anticoagulants 11/02/2013   Pradaxa   . Measles    as child  . Mouth dryness   . Mumps    as teenager  . Numbness and tingling    lips  . Osteoporosis    "unsure-maybe in knees"  . Psoriasis   . Psoriatic arthritis ([place])   . Stroke ([place]) 2012  . Vertigo    being monitored, occasional   Past Surgical History:  Procedure Laterality Date  . APPENDECTOMY  as child  . CARDIAC CATHETERIZATION  ~2006   clear  . CORONARY ARTERY BYPASS GRAFT  02/16/2005  . LUMBAR DISC SURGERY  09/2004   L1-L5  . TONSILLECTOMY  as child   with adenoids  . TOTAL KNEE ARTHROPLASTY Left 09/11/2015   Procedure: LEFT TOTAL KNEE ARTHROPLASTY;  Surgeon: Gaynelle Arabian, MD;  Location: [facility];  Service: Orthopedics;  Laterality: Left;     A IV Location/Drains/Wounds Patient Lines/Drains/Airways Status   Active Line/Drains/Airways    Name:   Placement date:   Placement time:   Site:   Days:   Peripheral IV 04/17/19 Left Forearm   04/17/19    -    Forearm   less than 1   Incision (Closed) 09/11/15 Knee Left   09/11/15  4132     1314          Intake/Output Last 24 hours No intake or output data in the 24 hours ending 04/17/19 1447  Labs/Imaging Results for orders placed or performed during the hospital encounter of 04/17/19 (from the past 48 hour(s))  CBC with Differential     Status: Abnormal   Collection Time: 04/17/19 10:48 AM  Result Value Ref Range   WBC 10.0 4.0 - 10.5 K/uL   RBC 4.50 3.87 - 5.11 MIL/uL   Hemoglobin 12.7 12.0 - 15.0 g/dL   HCT 40.0 36.0 - 46.0 %   MCV 88.9 80.0 - 100.0 fL   MCH 28.2 26.0 - 34.0 pg   MCHC 31.8 30.0 - 36.0 g/dL   RDW 12.2 11.5 - 15.5 %   Platelets 156 150 - 400 K/uL   nRBC 0.0 0.0 - 0.2 %   Neutrophils Relative % 77 %   Neutro Abs 7.8 (H) 1.7 - 7.7 K/uL   Lymphocytes Relative 16 %   Lymphs Abs 1.6 0.7 - 4.0 K/uL   Monocytes Relative 5 %   Monocytes Absolute 0.5 0.1 - 1.0 K/uL    Eosinophils Relative 1 %   Eosinophils Absolute 0.1 0.0 - 0.5 K/uL   Basophils Relative 0 %   Basophils Absolute 0.0 0.0 - 0.1 K/uL   Immature Granulocytes 1 %   Abs Immature Granulocytes 0.06 0.00 - 0.07 K/uL    Comment: Performed at [facility], [place] [address], [place], [place] [address]  Basic metabolic panel     Status: Abnormal   Collection Time: 04/17/19 10:48 AM  Result Value Ref Range   Sodium 137 135 - 145 mmol/L   Potassium 4.2 3.5 - 5.1 mmol/L   Chloride 107 98 - 111 mmol/L   CO2 24 22 - 32 mmol/L   Glucose, Bld 179 (H) 70 - 99 mg/dL   BUN 16 8 - 23 mg/dL   Creatinine, Ser 0.64 0.44 - 1.00 mg/dL   Calcium 8.6 (L) 8.9 - 10.3 mg/dL   GFR calc non Af Amer >60 >60 mL/min   GFR calc Af Amer >60 >60 mL/min   Anion gap 6 5 - 15    Comment: Performed at [facility], [place] [address], [place], [place] [address]  Type and screen [place]     Status: None   Collection Time: 04/17/19 11:38 AM  Result Value Ref Range   ABO/RH(D) A POS    Antibody Screen NEG    Sample Expiration      04/20/2019 Performed at [facility], [place] [address], [place], [place] [address]   CBG monitoring, ED     Status: Abnormal   Collection Time: 04/17/19 11:52 AM  Result Value Ref Range   Glucose-Capillary 176 (H) 70 - 99 mg/dL  Troponin I - ONCE - STAT     Status: None   Collection Time: 04/17/19 11:55 AM  Result Value Ref Range   Troponin I <0.03 <0.03 ng/mL    Comment: Performed at [facility], [place] [address] [place], [place] [address]  Lactic acid, plasma     Status: None   Collection Time: 04/17/19 11:55 AM  Result Value Ref Range   Lactic Acid, Venous 1.3 0.5 - 1.9 mmol/L    Comment: Performed at [facility], [place] [address], [place], [place] [address]  Protime-INR     Status: Abnormal   Collection Time:  04/17/19 12:06 PM  Result Value Ref Range   Prothrombin Time 17.7  (H) 11.4 - 15.2 seconds   INR 1.5 (H) 0.8 - 1.2    Comment: (NOTE) INR goal varies based on device and disease states. Performed at [facility], [place] [address] [place], [place] [address]   Blood gas, venous (at [facility] and [facility])     Status: None   Collection Time: 04/17/19 12:22 PM  Result Value Ref Range   pH, Ven 7.367 7.250 - 7.430   pCO2, Ven 48.0 44.0 - 60.0 mmHg   pO2, Ven BELOW REPORTABLE RANGE 32.0 - 45.0 mmHg    Comment: CRITICAL RESULT CALLED TO, READ BACK BY AND VERIFIED WITH: Crissie Reese, MD AT 1228 BY M.JESTER, RRT, RCP ON 04/17/2019    Bicarbonate 26.8 20.0 - 28.0 mmol/L   Acid-Base Excess 1.4 0.0 - 2.0 mmol/L   O2 Saturation 46.6 %   Patient temperature 98.6    Collection site VENOUS    Drawn by DRAWN BY RN    Sample type VENOUS     Comment: Performed at [place] [address] [place], [place] [address]   Ct Head Wo Contrast  Result Date: 04/17/2019 CLINICAL DATA:  New onset of confusion.  Blood thinners. EXAM: CT HEAD WITHOUT CONTRAST TECHNIQUE: Contiguous axial images were obtained from the base of the skull through the vertex without intravenous contrast. COMPARISON:  April 17, 2019 FINDINGS: Brain: No subdural, epidural, or subarachnoid hemorrhage. Stable left superior cerebellar infarct. Cerebellum, brainstem, and basal cisterns are otherwise normal. Scattered white matter changes are noted. Ventricles and sulci are unremarkable. No mass effect or midline shift. No acute cortical ischemia or infarct. Vascular: No hyperdense vessel or unexpected calcification. Skull: Normal. Negative for fracture or focal lesion. Sinuses/Orbits: No acute finding. Other: None. IMPRESSION: 1. No acute intracranial abnormalities. Scattered white matter changes. Chronic left cerebellar infarct. No intracranial hemorrhage noted. Electronically Signed   By: Dorise Bullion III M.D   On: 04/17/2019 13:18   Ct Head Wo Contrast  Result Date:  04/17/2019 CLINICAL DATA:  Unwitnessed fall. EXAM: CT HEAD WITHOUT CONTRAST TECHNIQUE: Contiguous axial images were obtained from the base of the skull through the vertex without intravenous contrast. COMPARISON:  Head CT and MRI 11/15/2018 FINDINGS: Brain: There is no evidence of acute infarct, intracranial hemorrhage, mass, midline shift, or extra-axial fluid collection. Mild cerebral atrophy is within normal limits for age. A chronic superior left cerebellar infarct is unchanged. Patchy cerebral white matter hypodensities are unchanged and nonspecific but compatible with moderate chronic small vessel ischemic disease. Vascular: Calcified atherosclerosis at the skull base. No hyperdense vessel. Skull: No fracture or focal osseous lesion. Sinuses/Orbits: Visualized paranasal sinuses and mastoid air cells are clear. Bilateral cataract extraction is noted. Other: None. IMPRESSION: 1. No evidence of acute intracranial abnormality. 2. Moderate chronic small vessel ischemic disease. Electronically Signed   By: Logan Bores M.D.   On: 04/17/2019 11:36   Dg Chest Port 1 View  Result Date: 04/17/2019 CLINICAL DATA:  Unwitnessed fall. EXAM: PORTABLE CHEST 1 VIEW COMPARISON:  March 24, 2006 FINDINGS: The heart size and mediastinal contours are within normal limits. Both lungs are clear. The visualized skeletal structures are unremarkable. IMPRESSION: No active disease. Electronically Signed   By: Dorise Bullion III M.D   On: 04/17/2019 11:32   Dg Hip Unilat W Or Wo Pelvis 2-3 Views Left  Result Date: 04/17/2019 CLINICAL DATA:  LEFT hip pain EXAM: DG HIP (  WITH OR WITHOUT PELVIS) 2-3V LEFT COMPARISON:  None. FINDINGS: There is shortening of the LEFT femoral neck. There is linear superimposition of bone density along the inferior margin. These findings are consistent with LEFT femoral neck fracture. Fracture appears to be subcapital in location. No significant angulation. There is bilateral joint space narrowing of the  hip joints. Sclerosis of the acetabular roofs. IMPRESSION: 1. Subcapital LEFT femoral neck fracture. 2. Bilateral osteoarthritis of the hip joints. Electronically Signed   By: Suzy Bouchard M.D.   On: 04/17/2019 11:34    Pending Labs Unresulted Labs (From admission, onward)    Start     Ordered   04/18/19 3646  Basic metabolic panel  Tomorrow morning,   R     04/17/19 1406   04/18/19 0500  CBC  Tomorrow morning,   R     04/17/19 1406   04/17/19 1307  Urinalysis, Routine w reflex microscopic  Once,   R     04/17/19 1306          Vitals/Pain Today's Vitals   04/17/19 1043 04/17/19 1059 04/17/19 1200 04/17/19 1300  BP:   (!) 153/81 (!) 159/78  Pulse:  88 93 94  Resp:   14 14  Temp:  98.4 F (36.9 C)    TempSrc:  Oral    SpO2: 98% 95% 94% 98%    Isolation Precautions No active isolations  Medications Medications  acetaminophen (TYLENOL) tablet 1,000 mg (has no administration in time range)  carvedilol (COREG) tablet 12.5 mg (has no administration in time range)  clonazePAM (KLONOPIN) tablet 0.5 mg (has no administration in time range)  cyclobenzaprine (FLEXERIL) tablet 10 mg (has no administration in time range)  traZODone (DESYREL) tablet 25 mg (has no administration in time range)  vitamin B-12 (CYANOCOBALAMIN) tablet 250 mcg (has no administration in time range)  sodium chloride flush (NS) 0.9 % injection 3 mL (has no administration in time range)  sodium chloride flush (NS) 0.9 % injection 3 mL (has no administration in time range)  0.9 %  sodium chloride infusion (has no administration in time range)  acetaminophen (TYLENOL) tablet 650 mg (has no administration in time range)    Or  acetaminophen (TYLENOL) suppository 650 mg (has no administration in time range)  ondansetron (ZOFRAN) tablet 4 mg (has no administration in time range)    Or  ondansetron (ZOFRAN) injection 4 mg (has no administration in time range)  enoxaparin (LOVENOX) injection 40 mg (has no  administration in time range)  insulin aspart (novoLOG) injection 0-9 Units (has no administration in time range)  insulin aspart (novoLOG) injection 0-5 Units (has no administration in time range)  magnesium sulfate IVPB 2 g 50 mL (2 g Intravenous New Bag/Given 04/17/19 1324)  0.9 %  sodium chloride infusion ( Intravenous New Bag/Given 04/17/19 1322)    Mobility non-ambulatory Moderate fall risk   Focused Assessments Cardiac Assessment Handoff:    Lab Results  Component Value Date   CKTOTAL 47 02/04/2011   CKMB 1.6 02/04/2011   TROPONINI <0.03 04/17/2019   No results found for: DDIMER Does the Patient currently have chest pain? No     R Recommendations: See Admitting Provider Note  Report given to:   Additional Notes: left hip fx

## 2019-04-17 NOTE — ED Notes (Signed)
Pt transported to and returned from CT.

## 2019-04-17 NOTE — Progress Notes (Signed)
I have discussed this case with Dr. Ellender Hose in ED, Ms. Lorenzi will need arthroplasty of left hip.  Will need to wait 48-72 hours 2/2 Pradaxa.  Full consult note to come later this afternoon.  May have diet for now and tomorrow.

## 2019-04-18 DIAGNOSIS — R4189 Other symptoms and signs involving cognitive functions and awareness: Secondary | ICD-10-CM

## 2019-04-18 DIAGNOSIS — M1990 Unspecified osteoarthritis, unspecified site: Secondary | ICD-10-CM

## 2019-04-18 LAB — TROPONIN I
Troponin I: <0.03 ng/mL (ref <0.03)
Troponin I: <0.03 ng/mL (ref <0.03)

## 2019-04-18 LAB — CBC
HCT: 38 % (ref 36.0–46.0)
Hemoglobin: 12.4 g/dL (ref 12.0–15.0)
MCH: 29 pg (ref 26.0–34.0)
MCHC: 32.6 g/dL (ref 30.0–36.0)
MCV: 89 fL (ref 80.0–100.0)
Platelets: 151 10*3/uL (ref 150–400)
RBC: 4.27 MIL/uL (ref 3.87–5.11)
RDW: 12.5 % (ref 11.5–15.5)
WBC: 9.4 10*3/uL (ref 4.0–10.5)
nRBC: 0 % (ref 0.0–0.2)

## 2019-04-18 LAB — MRSA PCR SCREENING: MRSA by PCR: NEGATIVE

## 2019-04-18 LAB — BASIC METABOLIC PANEL
Anion gap: 7 (ref 5–15)
BUN: 17 mg/dL (ref 8–23)
CO2: 24 mmol/L (ref 22–32)
Calcium: 8.7 mg/dL — ABNORMAL LOW (ref 8.9–10.3)
Chloride: 106 mmol/L (ref 98–111)
Creatinine, Ser: 0.79 mg/dL (ref 0.44–1.00)
GFR calc Af Amer: >60 mL/min (ref >60)
GFR calc non Af Amer: >60 mL/min (ref >60)
Glucose, Bld: 116 mg/dL — ABNORMAL HIGH (ref 70–99)
Potassium: 4.3 mmol/L (ref 3.5–5.1)
Sodium: 137 mmol/L (ref 135–145)

## 2019-04-18 LAB — URINALYSIS, ROUTINE W REFLEX MICROSCOPIC
Bilirubin Urine: NEGATIVE
Glucose, UA: NEGATIVE mg/dL
Leukocytes,Ua: NEGATIVE
Nitrite: NEGATIVE
Protein, ur: NEGATIVE mg/dL
Specific Gravity, Urine: 1.02 (ref 1.005–1.030)
pH: 5 (ref 5.0–8.0)

## 2019-04-18 LAB — URINALYSIS, MICROSCOPIC (REFLEX): Squamous Epithelial / HPF: NONE SEEN (ref 0–5)

## 2019-04-18 LAB — GLUCOSE, CAPILLARY
Glucose-Capillary: 138 mg/dL — ABNORMAL HIGH (ref 70–99)
Glucose-Capillary: 147 mg/dL — ABNORMAL HIGH (ref 70–99)
Glucose-Capillary: 154 mg/dL — ABNORMAL HIGH (ref 70–99)

## 2019-04-18 MED ORDER — CHLORHEXIDINE GLUCONATE 4 % EX LIQD
60.0000 mL | Freq: Once | CUTANEOUS | Status: AC
  Administered 2019-04-19: 4 via TOPICAL

## 2019-04-18 NOTE — Progress Notes (Signed)
Patient still has not voided today. Bladder scan shows 159cc. Paged MD about a foley. Patient was I&O cathed early this morning at 0540.

## 2019-04-18 NOTE — Progress Notes (Signed)
Rapid Response Event Note Called for Code Stroke on pt d/t acute confusion and changes in mental status.    Initial Focused Assessment:   Pt very hard of hearing, following some commands.  Agitated and anxious.  MD and Pt RN at the bedside.  See VS flow sheet.  Interventions: Stroke scale and pt taken to CT scan agitated, MD aware.  Stroke Protocol initiated.  Plan of Care (if not transferred):  Pt taken to 1235 ICU/SD, Kayla, RN to assume pt care.  MD called informed of pt results available for review.     Dyann Ruddle

## 2019-04-18 NOTE — Progress Notes (Signed)
Pt received from step down. VSS, pt without acute distress. This RN agrees with assessment findings from earlier RN assessment.

## 2019-04-18 NOTE — Consult Note (Addendum)
NEURO HOSPITALIST  CONSULT   Requesting Physician: Dr. Ree Kida    Chief Complaint:  Acute confusion and lethargy  History obtained from:  Patient  / Son  HPI:                                                                                                                                         [name] is an 85 y.o. female with PMH CVA ( 2012), HLD, HTN, DM, CAD, Atrial flutter, on long term anticoagulation ( pradaxa), vertigo and complicated migraines who  presented to [facility] after a fall ( found to have a left hip fracture), and then had an acute onset of confusion and lethargy. Code stroke was initiated.   . Patient fell yesterday morning ( 03/31/2019) while walking to the basement. She reports having had an episode of vertigo and that caused her to fall. She was brought to hospital by EMS. She was oriented at initial presentation to ED and denies hitting her head, but then became confused. "Per son she has become confused with narcotics in the past." spoke with son Dr. Benay Spice who was present when I arrived. He states that patient is back to baseline.  He also stated that she has had similar confused episodes and some acute expressive aphasia which is always proceeded by a HA, but patient denies HA yesterday. Denies CP, SOB, weakness, vision changes, slurred speech.    Hospital course: 4/18: fell, admitted for left hip fracture; code stroke called for acute confusion and lethargy Seen by tele neurology CTH: no hemorrhage CTA: no LVO BP: 139/51 BG: 116  01/2011: left cerebellar infarct 2015: word finding difficulty and right  Hand numbness and confusion. Determined to be complicated migraine.   Date last known well: 04/17/2019 Time last known well: 1200 tPA Given: No: on pradaxa and presented outside of window.   Modified Rankin: Rankin Score=1 NIHSS:2 left leg drift, facial droop  Past Medical History:  Diagnosis Date  . Anginal pain  ([place])    "saw doctor-think it was esophagus spasm"  . Arthritis   . Atrial flutter ([place]) 11/02/2013  . Cataract    left eye  . Coronary artery disease   . Diabetes mellitus without complication ([place])    type 2  . Essential hypertension, benign 11/02/2013  . H/O jaundice    as child  . Headache 7425   complicated migraine x2   . Hearing trouble   . Heart disease   . Hepatitis    hx of hepatitis A as child  . High cholesterol    under control  . Long term (current) use of  anticoagulants 11/02/2013   Pradaxa   . Measles    as child  . Mouth dryness   . Mumps    as teenager  . Numbness and tingling    lips  . Osteoporosis    "unsure-maybe in knees"  . Psoriasis   . Psoriatic arthritis ([place])   . Stroke ([place]) 2012  . Vertigo    being monitored, occasional    Past Surgical History:  Procedure Laterality Date  . APPENDECTOMY  as child  . CARDIAC CATHETERIZATION  ~2006   clear  . CORONARY ARTERY BYPASS GRAFT  02/16/2005  . LUMBAR DISC SURGERY  09/2004   L1-L5  . TONSILLECTOMY  as child   with adenoids  . TOTAL KNEE ARTHROPLASTY Left 09/11/2015   Procedure: LEFT TOTAL KNEE ARTHROPLASTY;  Surgeon: Gaynelle Arabian, MD;  Location: [facility];  Service: Orthopedics;  Laterality: Left;    Family History  Problem Relation Age of Onset  . Heart failure Mother   . Neuromuscular disorder Father          Social History:  reports that she has never smoked. She has never used smokeless tobacco. She reports that she does not drink alcohol or use drugs.  Allergies:  Allergies  Allergen Reactions  . Codeine Nausea And Vomiting  . Penicillins Swelling and Other (See Comments)    Swelling of tongue  . Atenolol Other (See Comments)  . Atorvastatin     Muscle soreness  . Metoprolol Other (See Comments)  . Zetia [Ezetimibe] Other (See Comments)    Medications:                                                                                                                            Scheduled: . acetaminophen  1,000 mg Oral Once  . carvedilol  12.5 mg Oral BID WC  . enoxaparin (LOVENOX) injection  40 mg Subcutaneous Q24H  . insulin aspart  0-5 Units Subcutaneous QHS  . insulin aspart  0-9 Units Subcutaneous TID WC  . insulin glargine  20 Units Subcutaneous QHS  . rosuvastatin  10 mg Oral QHS  . sodium chloride (PF)      . sodium chloride flush  3 mL Intravenous Q12H  . traZODone  25 mg Oral QHS  . vitamin B-12  250 mcg Oral Daily   Continuous: . sodium chloride    . sodium chloride 100 mL/hr at 04/18/19 0737   AOZ:HYQMVH chloride, acetaminophen **OR** acetaminophen, clonazePAM, cyclobenzaprine, hydrALAZINE, ketorolac, morphine injection, ondansetron **OR** ondansetron (ZOFRAN) IV, sodium chloride flush   ROS:  ROS was performed and is negative except as noted in HPI    General Examination:                                                                                                      Blood pressure (!) 139/51, pulse (!) 104, temperature 98.8 F (37.1 C), temperature source Oral, resp. rate 18, height 5\' 2"  (1.575 m), weight 81.9 kg, SpO2 98 %.  HEENT-  Normocephalic, no lesions, without obvious abnormality.  Normal external eye and conjunctiva. Cardiovascular- S1-S2 audible, pulses palpable throughout  Lungs-no rhonchi or wheezing noted, no excessive working breathing.  Saturations within normal limits on 2.5 L [place] Extremities- Warm, dry and intact Musculoskeletal- left hip fracture Skin-warm and dry, no hyperpigmentation, vitiligo, or suspicious lesions  Neurological Examination Mental Status: Alert, oriented, thought content appropriate.  Speech fluent without evidence of aphasia.  Able to follow  commands without difficulty. Cranial Nerves: II: Visual fields grossly normal,  III,IV, VI: ptosis not present,  extra-ocular motions intact bilaterally, pupils equal, round, reactive to light and accommodation V,VII: smile asymmetric slight right facial droop, facial light touch sensation normal bilaterally VIII: patient is hard of hearing w/o her hearing aids.  IX,X: uvula rises midline XI: bilateral shoulder shrug XII: midline tongue extension Motor: Right : Upper extremity   5/5  Left:     Upper extremity   5/5  Lower extremity   5/5   Lower extremity   4/5 Tone and bulk:normal tone throughout; no atrophy noted Sensory:  light touch intact throughout, bilaterally Deep Tendon Reflexes: 2+ and symmetric biceps and patella Plantars: Right: downgoing   Left: downgoing Cerebellar: normal finger-to-nose, unable to perform HTS d/t pain from hip fracture Gait: deferred   Lab Results: Basic Metabolic Panel: Recent Labs  Lab 04/17/19 1048 04/17/19 2126 04/18/19 0257  NA 137 135 137  K 4.2 4.1 4.3  CL 107 104 106  CO2 24 23 24   GLUCOSE 179* 149* 116*  BUN 16 17 17   CREATININE 0.64 0.74 0.79  CALCIUM 8.6* 8.9 8.7*    CBC: Recent Labs  Lab 04/17/19 1048 04/17/19 2126 04/18/19 0257  WBC 10.0 10.1 9.4  NEUTROABS 7.8*  --   --   HGB 12.7 13.2 12.4  HCT 40.0 39.8 38.0  MCV 88.9 88.6 89.0  PLT 156 154 151    Lipid Panel: No results for input(s): CHOL, TRIG, HDL, CHOLHDL, VLDL, LDLCALC in the last 168 hours.  CBG: Recent Labs  Lab 04/17/19 1152 04/17/19 1817 04/17/19 1936 04/17/19 2237  GLUCAP 176* 144* 157* 129*    Imaging: Ct Angio Head W Or Wo Contrast  Result Date: 04/17/2019 CLINICAL DATA:  85 year old female with worsening mental status status post fall with left femoral neck fracture. Increasing confusion. EXAM: CT ANGIOGRAPHY HEAD TECHNIQUE: Multidetector CT imaging of the head was performed using the standard protocol during bolus administration of intravenous contrast. Multiplanar CT image reconstructions and MIPs were obtained to evaluate the vascular anatomy.  CONTRAST:  181mL OMNIPAQUE IOHEXOL 350 MG/ML SOLN COMPARISON:  Noncontrast head CTs today 2003 hours  and earlier. FINDINGS: Posterior circulation: Codominant distal vertebral arteries are patent to the vertebrobasilar junction without stenosis. Normal PICA origins. Patent basilar artery without stenosis. Patent SCA origins. Normal right PCA origin. Fetal type left PCA origin. Right posterior communicating artery is diminutive or absent. Bilateral PCA branches are within normal limits. Anterior circulation: Tortuous distal cervical ICAs, more so on the right. Patent bilateral ICA siphons. On the left there is calcified atherosclerosis but only mild left siphon stenosis. Normal left ophthalmic and posterior communicating artery origins. On the right there is similar calcified atherosclerosis with mild right siphon stenosis. Patent carotid termini, MCA and ACA origins. Diminutive or absent anterior communicating artery. Bilateral ACA branches are within normal limits. Left MCA M1 segment and trifurcation are patent without stenosis. Left MCA branches are within normal limits. Right MCA M1 segment and trifurcation are patent without stenosis. Right MCA branches are within normal limits. Venous sinuses: Major venous structures appear to be patent on the delayed images. Anatomic variants: Fetal type left PCA origin. Delayed phase: No abnormal enhancement identified. Gray-white matter differentiation appears stable since 1249 hours earlier today, including chronic small left SCA territory infarct. IMPRESSION: 1. Negative for age intracranial CTA. No intracranial hemodynamically significant stenosis or arterial occlusion identified. 2. Stable CT appearance of the brain since 1249 hours today. Electronically Signed   By: Genevie Ann M.D.   On: 04/17/2019 20:57   Ct Head Wo Contrast  Result Date: 04/17/2019 CLINICAL DATA:  New onset of confusion.  Blood thinners. EXAM: CT HEAD WITHOUT CONTRAST TECHNIQUE: Contiguous axial  images were obtained from the base of the skull through the vertex without intravenous contrast. COMPARISON:  April 17, 2019 FINDINGS: Brain: No subdural, epidural, or subarachnoid hemorrhage. Stable left superior cerebellar infarct. Cerebellum, brainstem, and basal cisterns are otherwise normal. Scattered white matter changes are noted. Ventricles and sulci are unremarkable. No mass effect or midline shift. No acute cortical ischemia or infarct. Vascular: No hyperdense vessel or unexpected calcification. Skull: Normal. Negative for fracture or focal lesion. Sinuses/Orbits: No acute finding. Other: None. IMPRESSION: 1. No acute intracranial abnormalities. Scattered white matter changes. Chronic left cerebellar infarct. No intracranial hemorrhage noted. Electronically Signed   By: Dorise Bullion III M.D   On: 04/17/2019 13:18   Ct Head Wo Contrast  Result Date: 04/17/2019 CLINICAL DATA:  Unwitnessed fall. EXAM: CT HEAD WITHOUT CONTRAST TECHNIQUE: Contiguous axial images were obtained from the base of the skull through the vertex without intravenous contrast. COMPARISON:  Head CT and MRI 11/15/2018 FINDINGS: Brain: There is no evidence of acute infarct, intracranial hemorrhage, mass, midline shift, or extra-axial fluid collection. Mild cerebral atrophy is within normal limits for age. A chronic superior left cerebellar infarct is unchanged. Patchy cerebral white matter hypodensities are unchanged and nonspecific but compatible with moderate chronic small vessel ischemic disease. Vascular: Calcified atherosclerosis at the skull base. No hyperdense vessel. Skull: No fracture or focal osseous lesion. Sinuses/Orbits: Visualized paranasal sinuses and mastoid air cells are clear. Bilateral cataract extraction is noted. Other: None. IMPRESSION: 1. No evidence of acute intracranial abnormality. 2. Moderate chronic small vessel ischemic disease. Electronically Signed   By: Logan Bores M.D.   On: 04/17/2019 11:36   Dg  Chest Port 1 View  Result Date: 04/17/2019 CLINICAL DATA:  Unwitnessed fall. EXAM: PORTABLE CHEST 1 VIEW COMPARISON:  March 24, 2006 FINDINGS: The heart size and mediastinal contours are within normal limits. Both lungs are clear. The visualized skeletal structures are unremarkable. IMPRESSION: No active disease. Electronically  Signed   By: Dorise Bullion III M.D   On: 04/17/2019 11:32   Dg Hip Unilat W Or Wo Pelvis 2-3 Views Left  Result Date: 04/17/2019 CLINICAL DATA:  LEFT hip pain EXAM: DG HIP (WITH OR WITHOUT PELVIS) 2-3V LEFT COMPARISON:  None. FINDINGS: There is shortening of the LEFT femoral neck. There is linear superimposition of bone density along the inferior margin. These findings are consistent with LEFT femoral neck fracture. Fracture appears to be subcapital in location. No significant angulation. There is bilateral joint space narrowing of the hip joints. Sclerosis of the acetabular roofs. IMPRESSION: 1. Subcapital LEFT femoral neck fracture. 2. Bilateral osteoarthritis of the hip joints. Electronically Signed   By: Suzy Bouchard M.D.   On: 04/17/2019 11:34   Ct Head Code Stroke Wo Contrast  Addendum Date: 04/17/2019   ADDENDUM REPORT: 04/17/2019 20:36 ADDENDUM: Study discussed by telephone with Dr. Arby Barrette on 04/17/2019 at 2032 hours. Electronically Signed   By: Genevie Ann M.D.   On: 04/17/2019 20:36   Result Date: 04/17/2019 CLINICAL DATA:  Code stroke. 85 year old female status post fall earlier today, confusion. EXAM: CT HEAD WITHOUT CONTRAST TECHNIQUE: Contiguous axial images were obtained from the base of the skull through the vertex without intravenous contrast. COMPARISON:  Head CT 1249 hours today and earlier. FINDINGS: This exam is moderately degraded by motion artifact despite repeated imaging attempts. Brain: No intracranial mass effect or ventriculomegaly. No acute intracranial hemorrhage identified. Left superior cerebellar infarct appears stable since November  2019. Supratentorial gray-white matter differentiation is stable within the limitations of this exam. Vascular: Calcified atherosclerosis at the skull base. Skull: No acute osseous abnormality identified. Sinuses/Orbits: Stable and generally well pneumatized. Other: No acute orbit or scalp soft tissue finding. ASPECTS [facility] Stroke Program Early CT Score) - suboptimal evaluation due to motion, estimated ASPECTS 10. IMPRESSION: 1. Moderately motion degraded exam despite repeated imaging attempts. 2. Grossly stable from head CTs earlier today, no acute intracranial abnormality identified. Electronically Signed: By: Genevie Ann M.D. On: 04/17/2019 20:24   Laurey Morale, MSN, NP-C [facility] [phone]  04/18/2019, 7:30 AM   Attending physician note to follow with Assessment and plan . I have seen the patient and reviewed the above note.  I saw Ms. Sherril for something similar in November of last year.  At that time, she had headache with it, and I felt that it was possibly complicated migraine.  It sounds like this event was relatively similar.  Assessment: 85 y.o. female with PMH CVA ( 2012), HLD, HTN, DM, CAD, Atrial flutter, on long term anticoagulation ( pradaxa), vertigo and complicated migraines who  presented to [facility] after a fall ( found to have a left hip fracture), and had confusion/lethargy.  She then improved, but worsened after a nap.  A stat CT angiogram was obtained which ruled out fluctuating LVO.  With the character of the spell being different from her previous, and no headache, I do think an MRI would be reasonable.   With transient spells, other considerations would be seizures, which we could evaluate for with an EEG, or amyloid spells.  Amyloid spells are usually associated with amyloid angiopathy on MRI, which she did not have at her last evaluation.  Finally, delirium associated with pain is certainly possible as well.   Stroke Risk Factors - diabetes mellitus,  hyperlipidemia and hypertension    Recommendations: --EEG --MRI brain  Roland Rack, MD [facility] [phone]  If 7pm- 7am, please page neurology on  call as listed in [place].

## 2019-04-18 NOTE — Anesthesia Preprocedure Evaluation (Addendum)
Anesthesia Evaluation  Patient identified by MRN, date of birth, ID band Patient confused    Reviewed: Allergy & Precautions, NPO status , Patient's Chart, lab work & pertinent test results  Airway Mallampati: II  TM Distance: >3 FB Neck ROM: Full    Dental no notable dental hx. (+) Teeth Intact, Dental Advisory Given   Pulmonary neg pulmonary ROS,    Pulmonary exam normal breath sounds clear to auscultation       Cardiovascular hypertension, Pt. on home beta blockers and Pt. on medications + CAD  Normal cardiovascular exam Rhythm:Regular Rate:Normal  04/17/19 ST w 1st degree w LBBB  07/15/2015 Lexiscan EF 72%     Neuro/Psych  Headaches, TIACVA negative psych ROS   GI/Hepatic negative GI ROS,   Endo/Other  diabetes, Type 2  Renal/GU K+ 4.3 Cr 0.79     Musculoskeletal   Abdominal   Peds  Hematology Hgb 12.4 Plt 151 INR 1.3 -4/18   Anesthesia Other Findings   Reproductive/Obstetrics                            Anesthesia Physical Anesthesia Plan  ASA: III  Anesthesia Plan: General   Post-op Pain Management:    Induction: Intravenous  PONV Risk Score and Plan: 3 and Ondansetron and Treatment may vary due to age or medical condition  Airway Management Planned: Oral ETT  Additional Equipment:   Intra-op Plan:   Post-operative Plan: Extubation in OR  Informed Consent: I have reviewed the patients History and Physical, chart, labs and discussed the procedure including the risks, benefits and alternatives for the proposed anesthesia with the patient or authorized representative who has indicated his/her understanding and acceptance.     Dental advisory given  Plan Discussed with: CRNA  Anesthesia Plan Comments: ( If not GA)       Anesthesia Quick Evaluation

## 2019-04-18 NOTE — Progress Notes (Signed)
PROGRESS NOTE    [name]  ION:[id number] DOB: December 23, 1934 DOA: 04/17/2019 PCP: [name], MD   Brief Narrative:  HPI On 04/17/2019 by [name] [name] is a 85 y.o. female with medical history of A-flutter, cAD, DM2, HTN, HLD, CVA, vertigo, Complex migrains who presents after a fall and is found to have a left hip fracture.   On my eval, she is confused and unable to contribute to history. She is repeating "There is something not right, I am cold". She is not able to tell me her name or where she is.  Per history obtained from ED doctor, RN and her son, she fell this AM while walking in her basement and was brought in by EMS. She told the ED doctor that she did not hit her head. She was oriented when she first arrived in the ED but became confused while she was in the ED.  Per her son, Dr [name],  she has become confused from narcotics in the past. In addition she had an episode in 11/19 of change in speech and right frontal headache. CVA work up was negative and she was suspected to have complicated migraines. She was having 2-3 episodes a week which stopped after her Metfomin was discontinued.  Assessment & Plan   Left hip fracture -s/p fall prior to admission -Left hip x-ray showed subcapital left femoral neck fracture -Orthopedic surgery consulted and appreciated -Patient currently on Pradaxa which has been held, with possible surgery planned for 04/19/2019 -Continue pain control  Acute metabolic encephalopathy -Resolved, patient currently alert and oriented x4 -CTA head x2, CTA head unremarkable -Neurology consulted and appreciated -UA unremarkable for infection -Chest x-ray unremarkable for infection -Possibly secondary to fall and hip pain versus possible medications vs ?migraine  Arthritis/chronic pain -Continue pain contorl   Diabetes mellitus, type II -Continue Lantus, insulin sliding scale and CBG monitoring  Coronary artery  disease -History of CABG -Stable, no complaints of chest pain -Continue statin, lisinopril, Coreg  Atrial flutter -Currently in sinus rhythm -Pradaxa held  Essential hypertension -Continue Coreg -Lisinopril held  Complicated migraines -stable  Insomnia  -Continue trazodone  DVT Prophylaxis  lovenox  Code Status: Full  Family Communication: None at bedside. Son via phone.  Disposition Plan: Admitted. Pending surgery on 04/19/2019. Dispo pending  Consultants Orthopedic surgery Neurology  Procedures  None  Antibiotics   Anti-infectives (From admission, onward)   None      Subjective:   [name] seen and examined today.  Complains of left leg pain, especially with movement. Denies chest pain, shortness of breath, abdominal pain, N/V/D/C, dizziness, headche.     Objective:   Vitals:   04/18/19 0800 04/18/19 0900 04/18/19 1000 04/18/19 1025  BP: (!) 133/56 (!) 126/47 (!) 113/35 (!) 116/43  Pulse: (!) 110 92 95 91  Resp: 18 14 15 16   Temp: 99.1 F (37.3 C)     TempSrc: Oral     SpO2: 96% 95% 97% 99%  Weight:      Height:        Intake/Output Summary (Last 24 hours) at 04/18/2019 1116 Last data filed at 04/18/2019 0900 Gross per 24 hour  Intake 1621.48 ml  Output 715 ml  Net 906.48 ml   Filed Weights   04/18/19 0453  Weight: 81.9 kg    Exam  General: Well developed, well nourished, NAD, appears stated age  HEENT: NCAT, mucous membranes moist.   Neck: Supple  Cardiovascular: S1 S2 auscultated,  RRR  Respiratory: Clear to auscultation bilaterally with equal chest rise  Abdomen: Soft, nontender, nondistended, + bowel sounds  Extremities: warm dry without cyanosis clubbing or edema. LLE pain with movement  Neuro: AAOx3, nonfocal  Psych: Normal affect and demeanor with intact judgement and insight   Data Reviewed: I have personally reviewed following labs and imaging studies  CBC: Recent Labs  Lab 04/17/19 1048 04/17/19 2126  04/18/19 0257  WBC 10.0 10.1 9.4  NEUTROABS 7.8*  --   --   HGB 12.7 13.2 12.4  HCT 40.0 39.8 38.0  MCV 88.9 88.6 89.0  PLT 156 154 151   Basic Metabolic Panel: Recent Labs  Lab 04/17/19 1048 04/17/19 2126 04/18/19 0257  NA 137 135 137  K 4.2 4.1 4.3  CL 107 104 106  CO2 24 23 24   GLUCOSE 179* 149* 116*  BUN 16 17 17   CREATININE 0.64 0.74 0.79  CALCIUM 8.6* 8.9 8.7*   GFR: Estimated Creatinine Clearance: 52.8 mL/min (by C-G formula based on SCr of 0.79 mg/dL). Liver Function Tests: No results for input(s): AST, ALT, ALKPHOS, BILITOT, PROT, ALBUMIN in the last 168 hours. No results for input(s): LIPASE, AMYLASE in the last 168 hours. No results for input(s): AMMONIA in the last 168 hours. Coagulation Profile: Recent Labs  Lab 04/17/19 1206 04/17/19 2126  INR 1.5* 1.3*   Cardiac Enzymes: Recent Labs  Lab 04/17/19 1155 04/17/19 2126 04/18/19 0257 04/18/19 0843  TROPONINI <0.03 <0.03 <0.03 <0.03   BNP (last 3 results) No results for input(s): PROBNP in the last 8760 hours. HbA1C: No results for input(s): HGBA1C in the last 72 hours. CBG: Recent Labs  Lab 04/17/19 1152 04/17/19 1817 04/17/19 1936 04/17/19 2237 04/18/19 0749  GLUCAP 176* 144* 157* 129* 138*   Lipid Profile: No results for input(s): CHOL, HDL, LDLCALC, TRIG, CHOLHDL, LDLDIRECT in the last 72 hours. Thyroid Function Tests: No results for input(s): TSH, T4TOTAL, FREET4, T3FREE, THYROIDAB in the last 72 hours. Anemia Panel: No results for input(s): VITAMINB12, FOLATE, FERRITIN, TIBC, IRON, RETICCTPCT in the last 72 hours. Urine analysis:    Component Value Date/Time   COLORURINE YELLOW 04/18/2019 0520   APPEARANCEUR CLEAR 04/18/2019 0520   LABSPEC 1.020 04/18/2019 0520   PHURINE 5.0 04/18/2019 0520   GLUCOSEU NEGATIVE 04/18/2019 0520   HGBUR TRACE (A) 04/18/2019 0520   BILIRUBINUR NEGATIVE 04/18/2019 0520   KETONESUR TRACE (A) 04/18/2019 0520   PROTEINUR NEGATIVE 04/18/2019 0520    UROBILINOGEN 0.2 09/05/2015 1531   NITRITE NEGATIVE 04/18/2019 0520   LEUKOCYTESUR NEGATIVE 04/18/2019 0520   Sepsis Labs: @LABRCNTIP (procalcitonin:4,lacticidven:4)  ) Recent Results (from the past 240 hour(s))  MRSA PCR Screening     Status: None   Collection Time: 04/17/19 11:45 PM  Result Value Ref Range Status   MRSA by PCR NEGATIVE NEGATIVE Final    Comment:        The GeneXpert MRSA Assay (FDA approved for NASAL specimens only), is one component of a comprehensive MRSA colonization surveillance program. It is not intended to diagnose MRSA infection nor to guide or monitor treatment for MRSA infections. Performed at [facility], [address], [place], [place] [address]       Radiology Studies: Ct Angio Head W Or Wo Contrast  Result Date: 04/17/2019 CLINICAL DATA:  85 year old female with worsening mental status status post fall with left femoral neck fracture. Increasing confusion. EXAM: CT ANGIOGRAPHY HEAD TECHNIQUE: Multidetector CT imaging of the head was performed using the standard protocol  during bolus administration of intravenous contrast. Multiplanar CT image reconstructions and MIPs were obtained to evaluate the vascular anatomy. CONTRAST:  OMNIPAQUE IOHEXOL 350 MG/ML SOLN COMPARISON:  Noncontrast head CTs today 2003 hours and earlier. FINDINGS: Posterior circulation: Codominant distal vertebral arteries are patent to the vertebrobasilar junction without stenosis. Normal PICA origins. Patent basilar artery without stenosis. Patent SCA origins. Normal right PCA origin. Fetal type left PCA origin. Right posterior communicating artery is diminutive or absent. Bilateral PCA branches are within normal limits. Anterior circulation: Tortuous distal cervical ICAs, more so on the right. Patent bilateral ICA siphons. On the left there is calcified atherosclerosis but only mild left siphon stenosis. Normal left ophthalmic and posterior communicating  artery origins. On the right there is similar calcified atherosclerosis with mild right siphon stenosis. Patent carotid termini, MCA and ACA origins. Diminutive or absent anterior communicating artery. Bilateral ACA branches are within normal limits. Left MCA M1 segment and trifurcation are patent without stenosis. Left MCA branches are within normal limits. Right MCA M1 segment and trifurcation are patent without stenosis. Right MCA branches are within normal limits. Venous sinuses: Major venous structures appear to be patent on the delayed images. Anatomic variants: Fetal type left PCA origin. Delayed phase: No abnormal enhancement identified. Gray-white matter differentiation appears stable since 1249 hours earlier today, including chronic small left SCA territory infarct. IMPRESSION: 1. Negative for age intracranial CTA. No intracranial hemodynamically significant stenosis or arterial occlusion identified. 2. Stable CT appearance of the brain since 1249 hours today. Electronically Signed   By: Odessa Fleming M.D.   On: 04/17/2019 20:57   Ct Head Wo Contrast  Result Date: 04/17/2019 CLINICAL DATA:  New onset of confusion.  Blood thinners. EXAM: CT HEAD WITHOUT CONTRAST TECHNIQUE: Contiguous axial images were obtained from the base of the skull through the vertex without intravenous contrast. COMPARISON:  April 17, 2019 FINDINGS: Brain: No subdural, epidural, or subarachnoid hemorrhage. Stable left superior cerebellar infarct. Cerebellum, brainstem, and basal cisterns are otherwise normal. Scattered white matter changes are noted. Ventricles and sulci are unremarkable. No mass effect or midline shift. No acute cortical ischemia or infarct. Vascular: No hyperdense vessel or unexpected calcification. Skull: Normal. Negative for fracture or focal lesion. Sinuses/Orbits: No acute finding. Other: None. IMPRESSION: 1. No acute intracranial abnormalities. Scattered white matter changes. Chronic left cerebellar infarct. No  intracranial hemorrhage noted. Electronically Signed   By: Gerome Sam III M.D   On: 04/17/2019 13:18   Ct Head Wo Contrast  Result Date: 04/17/2019 CLINICAL DATA:  Unwitnessed fall. EXAM: CT HEAD WITHOUT CONTRAST TECHNIQUE: Contiguous axial images were obtained from the base of the skull through the vertex without intravenous contrast. COMPARISON:  Head CT and MRI 11/15/2018 FINDINGS: Brain: There is no evidence of acute infarct, intracranial hemorrhage, mass, midline shift, or extra-axial fluid collection. Mild cerebral atrophy is within normal limits for age. A chronic superior left cerebellar infarct is unchanged. Patchy cerebral white matter hypodensities are unchanged and nonspecific but compatible with moderate chronic small vessel ischemic disease. Vascular: Calcified atherosclerosis at the skull base. No hyperdense vessel. Skull: No fracture or focal osseous lesion. Sinuses/Orbits: Visualized paranasal sinuses and mastoid air cells are clear. Bilateral cataract extraction is noted. Other: None. IMPRESSION: 1. No evidence of acute intracranial abnormality. 2. Moderate chronic small vessel ischemic disease. Electronically Signed   By: Sebastian Ache M.D.   On: 04/17/2019 11:36   Dg Chest Port 1 View  Result Date: 04/17/2019 CLINICAL DATA:  Unwitnessed fall. EXAM:  PORTABLE CHEST 1 VIEW COMPARISON:  March 24, 2006 FINDINGS: The heart size and mediastinal contours are within normal limits. Both lungs are clear. The visualized skeletal structures are unremarkable. IMPRESSION: No active disease. Electronically Signed   By: Gerome Sam III M.D   On: 04/17/2019 11:32   Dg Hip Unilat W Or Wo Pelvis 2-3 Views Left  Result Date: 04/17/2019 CLINICAL DATA:  LEFT hip pain EXAM: DG HIP (WITH OR WITHOUT PELVIS) 2-3V LEFT COMPARISON:  None. FINDINGS: There is shortening of the LEFT femoral neck. There is linear superimposition of bone density along the inferior margin. These findings are consistent with  LEFT femoral neck fracture. Fracture appears to be subcapital in location. No significant angulation. There is bilateral joint space narrowing of the hip joints. Sclerosis of the acetabular roofs. IMPRESSION: 1. Subcapital LEFT femoral neck fracture. 2. Bilateral osteoarthritis of the hip joints. Electronically Signed   By: Genevive Bi M.D.   On: 04/17/2019 11:34   Ct Head Code Stroke Wo Contrast  Addendum Date: 04/17/2019   ADDENDUM REPORT: 04/17/2019 20:36 ADDENDUM: Study discussed by telephone with Dr. Stevie Kern on 04/17/2019 at 2032 hours. Electronically Signed   By: Odessa Fleming M.D.   On: 04/17/2019 20:36   Result Date: 04/17/2019 CLINICAL DATA:  Code stroke. 85 year old female status post fall earlier today, confusion. EXAM: CT HEAD WITHOUT CONTRAST TECHNIQUE: Contiguous axial images were obtained from the base of the skull through the vertex without intravenous contrast. COMPARISON:  Head CT 1249 hours today and earlier. FINDINGS: This exam is moderately degraded by motion artifact despite repeated imaging attempts. Brain: No intracranial mass effect or ventriculomegaly. No acute intracranial hemorrhage identified. Left superior cerebellar infarct appears stable since November 2019. Supratentorial gray-white matter differentiation is stable within the limitations of this exam. Vascular: Calcified atherosclerosis at the skull base. Skull: No acute osseous abnormality identified. Sinuses/Orbits: Stable and generally well pneumatized. Other: No acute orbit or scalp soft tissue finding. ASPECTS [facility] Stroke Program Early CT Score) - suboptimal evaluation due to motion, estimated ASPECTS 10. IMPRESSION: 1. Moderately motion degraded exam despite repeated imaging attempts. 2. Grossly stable from head CTs earlier today, no acute intracranial abnormality identified. Electronically Signed: By: Odessa Fleming M.D. On: 04/17/2019 20:24     Scheduled Meds:  acetaminophen  1,000 mg Oral Once    carvedilol  12.5 mg Oral BID WC   enoxaparin (LOVENOX) injection  40 mg Subcutaneous Q24H   insulin aspart  0-5 Units Subcutaneous QHS   insulin aspart  0-9 Units Subcutaneous TID WC   insulin glargine  20 Units Subcutaneous QHS   rosuvastatin  10 mg Oral QHS   sodium chloride flush  3 mL Intravenous Q12H   traZODone  25 mg Oral QHS   vitamin B-12  250 mcg Oral Daily   Continuous Infusions:  sodium chloride     sodium chloride 100 mL/hr at 04/18/19 0900     LOS: 1 day   Time Spent in minutes   45 minutes  Stephene Alegria D.O. on 04/18/2019 at 11:16 AM  Between 7am to 7pm - Please see pager noted on [email or web address]  After 7pm go to [email or web address]  And look for the night coverage person covering for me after hours  [facility] Office  [phone]

## 2019-04-18 NOTE — Progress Notes (Signed)
Subjective:   Procedure(s) (LRB): TOTAL HIP ARTHROPLASTY ANTERIOR APPROACH (Left)  Patient reports pain as mild to moderate.  Resting in bed.  Son (Dr. Benay Spice) at bedside.  Son reports that the patient was able to take her pills this morning without difficulty.  Patient states that she did not take her pradaxa yesterday.  Son is hopeful that patient can be transferred to Ortho floor due to COVID-19 concerns.  Objective:   VITALS:  Temp:  [98.4 F (36.9 C)-98.8 F (37.1 C)] 98.8 F (37.1 C) (04/19 0400) Pulse Rate:  [88-110] 110 (04/19 0800) Resp:  [13-18] 18 (04/19 0800) BP: (108-163)/(49-101) 133/56 (04/19 0800) SpO2:  [83 %-100 %] 96 % (04/19 0800) Weight:  [81.9 kg] 81.9 kg (04/19 0453)  General: WDWN patient in NAD. Psych:  Appropriate mood and affect. Neuro:  A&O x 3, Moving all extremities, sensation intact to light touch HEENT:  EOMs intact Chest:  Even non-labored respirations Skin:  C/D/I, no rashes or lesions Extremities: warm/dry, mild edema, no erythema or echymosis.  No lymphadenopathy. Pulses: Popliteus 2+ MSK:  ROM: full ankle ROM, MMT: able to perform quad set, (-) Homan's    LABS Recent Labs    04/17/19 1048 04/17/19 2126 04/18/19 0257  HGB 12.7 13.2 12.4  WBC 10.0 10.1 9.4  PLT 156 154 151   Recent Labs    04/17/19 2126 04/18/19 0257  NA 135 137  K 4.1 4.3  CL 104 106  CO2 23 24  BUN 17 17  CREATININE 0.74 0.79  GLUCOSE 149* 116*   Recent Labs    04/17/19 1206 04/17/19 2126  INR 1.5* 1.3*     Assessment/Plan:   Procedure(s) (LRB): TOTAL HIP ARTHROPLASTY ANTERIOR APPROACH (Left)  Patient seen in rounds for Dr. Wynelle Link Neuro and Medicine notes reviewed NWB L LE Patient is NPO after midnight Continue to hold pradaxa Plan for L THA by Dr. Wynelle Link tomorrow morning.   Mechele Claude PA-C [facility] Office:  [phone]

## 2019-04-18 NOTE — Care Management (Signed)
Waiting PT/OT recommendations.Jonnie Finner RN CCM Case Mgmt phone [phone]

## 2019-04-19 ENCOUNTER — Inpatient Hospital Stay (HOSPITAL_COMMUNITY): Payer: Medicare Other | Admitting: Anesthesiology

## 2019-04-19 ENCOUNTER — Encounter (HOSPITAL_COMMUNITY)
Admission: EM | Disposition: A | Discharge status: Home Health | Payer: Self-pay | Source: Home / Self Care | Attending: Internal Medicine

## 2019-04-19 ENCOUNTER — Inpatient Hospital Stay (HOSPITAL_COMMUNITY): Payer: Medicare Other

## 2019-04-19 ENCOUNTER — Encounter (HOSPITAL_COMMUNITY): Payer: Self-pay | Admitting: Certified Registered"

## 2019-04-19 DIAGNOSIS — I25119 Atherosclerotic heart disease of native coronary artery with unspecified angina pectoris: Secondary | ICD-10-CM | POA: Diagnosis not present

## 2019-04-19 DIAGNOSIS — I4892 Unspecified atrial flutter: Secondary | ICD-10-CM | POA: Diagnosis not present

## 2019-04-19 DIAGNOSIS — S72002A Fracture of unspecified part of neck of left femur, initial encounter for closed fracture: Secondary | ICD-10-CM | POA: Diagnosis not present

## 2019-04-19 DIAGNOSIS — I10 Essential (primary) hypertension: Secondary | ICD-10-CM | POA: Diagnosis not present

## 2019-04-19 HISTORY — PX: TOTAL HIP ARTHROPLASTY: SHX124

## 2019-04-19 LAB — CBC
HCT: 36.7 % (ref 36.0–46.0)
Hemoglobin: 11.6 g/dL — ABNORMAL LOW (ref 12.0–15.0)
MCH: 29.1 pg (ref 26.0–34.0)
MCHC: 31.6 g/dL (ref 30.0–36.0)
MCV: 92.2 fL (ref 80.0–100.0)
Platelets: 108 10*3/uL — ABNORMAL LOW (ref 150–400)
RBC: 3.98 MIL/uL (ref 3.87–5.11)
RDW: 12.6 % (ref 11.5–15.5)
WBC: 8.3 10*3/uL (ref 4.0–10.5)
nRBC: 0 % (ref 0.0–0.2)

## 2019-04-19 LAB — GLUCOSE, CAPILLARY
Glucose-Capillary: 149 mg/dL — ABNORMAL HIGH (ref 70–99)
Glucose-Capillary: 154 mg/dL — ABNORMAL HIGH (ref 70–99)
Glucose-Capillary: 168 mg/dL — ABNORMAL HIGH (ref 70–99)
Glucose-Capillary: 232 mg/dL — ABNORMAL HIGH (ref 70–99)
Glucose-Capillary: 321 mg/dL — ABNORMAL HIGH (ref 70–99)

## 2019-04-19 LAB — BASIC METABOLIC PANEL
Anion gap: 9 (ref 5–15)
BUN: 17 mg/dL (ref 8–23)
CO2: 19 mmol/L — ABNORMAL LOW (ref 22–32)
Calcium: 8.3 mg/dL — ABNORMAL LOW (ref 8.9–10.3)
Chloride: 109 mmol/L (ref 98–111)
Creatinine, Ser: 0.64 mg/dL (ref 0.44–1.00)
GFR calc Af Amer: >60 mL/min (ref >60)
GFR calc non Af Amer: >60 mL/min (ref >60)
Glucose, Bld: 162 mg/dL — ABNORMAL HIGH (ref 70–99)
Potassium: 4.4 mmol/L (ref 3.5–5.1)
Sodium: 137 mmol/L (ref 135–145)

## 2019-04-19 SURGERY — ARTHROPLASTY, HIP, TOTAL, ANTERIOR APPROACH
Anesthesia: Spinal | Laterality: Left

## 2019-04-19 MED ORDER — FENTANYL CITRATE (PF) 100 MCG/2ML IJ SOLN: 25.0000 ug | INTRAMUSCULAR | Status: DC | PRN

## 2019-04-19 MED ORDER — CLINDAMYCIN PHOSPHATE 600 MG/50ML IV SOLN
600.0000 mg | Freq: Once | INTRAVENOUS | Status: AC
  Administered 2019-04-19: 600 mg via INTRAVENOUS
  Filled 2019-04-19: qty 50

## 2019-04-19 MED ORDER — DEXAMETHASONE SODIUM PHOSPHATE 10 MG/ML IJ SOLN
INTRAMUSCULAR | Status: DC | PRN
  Administered 2019-04-19: 8 mg via INTRAVENOUS

## 2019-04-19 MED ORDER — SUGAMMADEX SODIUM 200 MG/2ML IV SOLN
INTRAVENOUS | Status: DC | PRN
  Administered 2019-04-19: 175 mg via INTRAVENOUS

## 2019-04-19 MED ORDER — DEXAMETHASONE SODIUM PHOSPHATE 10 MG/ML IJ SOLN
10.0000 mg | Freq: Once | INTRAMUSCULAR | Status: AC
  Administered 2019-04-20: 10 mg via INTRAVENOUS
  Filled 2019-04-19: qty 1

## 2019-04-19 MED ORDER — ROCURONIUM BROMIDE 10 MG/ML (PF) SYRINGE
PREFILLED_SYRINGE | INTRAVENOUS | Status: AC
  Filled 2019-04-19: qty 10

## 2019-04-19 MED ORDER — DIPHENHYDRAMINE HCL 12.5 MG/5ML PO ELIX: 12.5000 mg | ORAL_SOLUTION | ORAL | Status: DC | PRN

## 2019-04-19 MED ORDER — SUCCINYLCHOLINE CHLORIDE 200 MG/10ML IV SOSY
PREFILLED_SYRINGE | INTRAVENOUS | Status: DC | PRN
  Administered 2019-04-19: 120 mg via INTRAVENOUS

## 2019-04-19 MED ORDER — CLINDAMYCIN PHOSPHATE 600 MG/50ML IV SOLN
600.0000 mg | Freq: Four times a day (QID) | INTRAVENOUS | Status: AC
  Administered 2019-04-19: 600 mg via INTRAVENOUS
  Filled 2019-04-19: qty 50

## 2019-04-19 MED ORDER — DEXAMETHASONE SODIUM PHOSPHATE 10 MG/ML IJ SOLN
INTRAMUSCULAR | Status: AC
  Filled 2019-04-19: qty 1

## 2019-04-19 MED ORDER — ACETAMINOPHEN 10 MG/ML IV SOLN
1000.0000 mg | Freq: Once | INTRAVENOUS | Status: AC
  Administered 2019-04-19: 1000 mg via INTRAVENOUS

## 2019-04-19 MED ORDER — LORATADINE 10 MG PO TABS
10.0000 mg | ORAL_TABLET | Freq: Every day | ORAL | Status: DC
  Administered 2019-04-20 – 2019-04-22 (×3): 10 mg via ORAL
  Filled 2019-04-19 (×3): qty 1

## 2019-04-19 MED ORDER — EPHEDRINE SULFATE-NACL 50-0.9 MG/10ML-% IV SOSY
PREFILLED_SYRINGE | INTRAVENOUS | Status: DC | PRN
  Administered 2019-04-19: 10 mg via INTRAVENOUS
  Administered 2019-04-19: 5 mg via INTRAVENOUS

## 2019-04-19 MED ORDER — SODIUM CHLORIDE 0.9 % IR SOLN
Status: DC | PRN
  Administered 2019-04-19: 1000 mL

## 2019-04-19 MED ORDER — SENNA 8.6 MG PO TABS: 1.0000 | ORAL_TABLET | Freq: Every evening | ORAL | Status: DC | PRN

## 2019-04-19 MED ORDER — BUPIVACAINE-EPINEPHRINE (PF) 0.25% -1:200000 IJ SOLN
INTRAMUSCULAR | Status: AC
  Filled 2019-04-19: qty 30

## 2019-04-19 MED ORDER — CLINDAMYCIN PHOSPHATE 900 MG/50ML IV SOLN
900.0000 mg | Freq: Once | INTRAVENOUS | Status: DC
  Filled 2019-04-19: qty 50

## 2019-04-19 MED ORDER — LACTATED RINGERS IV SOLN
INTRAVENOUS | Status: DC
  Administered 2019-04-19: 07:00:00 via INTRAVENOUS

## 2019-04-19 MED ORDER — METOCLOPRAMIDE HCL 5 MG PO TABS: 5.0000 mg | ORAL_TABLET | Freq: Three times a day (TID) | ORAL | Status: DC | PRN

## 2019-04-19 MED ORDER — PROPOFOL 10 MG/ML IV BOLUS
INTRAVENOUS | Status: AC
  Filled 2019-04-19: qty 20

## 2019-04-19 MED ORDER — SUCCINYLCHOLINE CHLORIDE 200 MG/10ML IV SOSY
PREFILLED_SYRINGE | INTRAVENOUS | Status: AC
  Filled 2019-04-19: qty 10

## 2019-04-19 MED ORDER — SUGAMMADEX SODIUM 200 MG/2ML IV SOLN
INTRAVENOUS | Status: AC
  Filled 2019-04-19: qty 2

## 2019-04-19 MED ORDER — FENTANYL CITRATE (PF) 100 MCG/2ML IJ SOLN
INTRAMUSCULAR | Status: DC | PRN
  Administered 2019-04-19 (×4): 50 ug via INTRAVENOUS

## 2019-04-19 MED ORDER — TRAMADOL HCL 50 MG PO TABS: 50.0000 mg | ORAL_TABLET | Freq: Four times a day (QID) | ORAL | Status: DC | PRN

## 2019-04-19 MED ORDER — ROCURONIUM BROMIDE 10 MG/ML (PF) SYRINGE
PREFILLED_SYRINGE | INTRAVENOUS | Status: DC | PRN
  Administered 2019-04-19: 5 mg via INTRAVENOUS
  Administered 2019-04-19: 30 mg via INTRAVENOUS

## 2019-04-19 MED ORDER — METHOCARBAMOL 1000 MG/10ML IJ SOLN
500.0000 mg | Freq: Four times a day (QID) | INTRAVENOUS | Status: DC | PRN
  Filled 2019-04-19: qty 5

## 2019-04-19 MED ORDER — DOCUSATE SODIUM 100 MG PO CAPS
100.0000 mg | ORAL_CAPSULE | Freq: Two times a day (BID) | ORAL | Status: DC
  Administered 2019-04-19 – 2019-04-22 (×6): 100 mg via ORAL
  Filled 2019-04-19 (×6): qty 1

## 2019-04-19 MED ORDER — ACETAMINOPHEN 10 MG/ML IV SOLN
INTRAVENOUS | Status: AC
  Filled 2019-04-19: qty 100

## 2019-04-19 MED ORDER — EPHEDRINE 5 MG/ML INJ
INTRAVENOUS | Status: AC
  Filled 2019-04-19: qty 10

## 2019-04-19 MED ORDER — HYDROCODONE-ACETAMINOPHEN 5-325 MG PO TABS
1.0000 | ORAL_TABLET | Freq: Four times a day (QID) | ORAL | Status: DC | PRN
  Administered 2019-04-19 – 2019-04-22 (×4): 1 via ORAL
  Filled 2019-04-19 (×4): qty 1

## 2019-04-19 MED ORDER — BISACODYL 10 MG RE SUPP: 10.0000 mg | Freq: Every day | RECTAL | Status: DC | PRN

## 2019-04-19 MED ORDER — ONDANSETRON HCL 4 MG/2ML IJ SOLN
INTRAMUSCULAR | Status: DC | PRN
  Administered 2019-04-19: 4 mg via INTRAVENOUS

## 2019-04-19 MED ORDER — POLYETHYLENE GLYCOL 3350 17 G PO PACK: 17.0000 g | PACK | Freq: Every day | ORAL | Status: DC | PRN

## 2019-04-19 MED ORDER — FENTANYL CITRATE (PF) 100 MCG/2ML IJ SOLN
INTRAMUSCULAR | Status: AC
  Filled 2019-04-19: qty 2

## 2019-04-19 MED ORDER — DABIGATRAN ETEXILATE MESYLATE 150 MG PO CAPS
150.0000 mg | ORAL_CAPSULE | Freq: Two times a day (BID) | ORAL | Status: DC
  Administered 2019-04-20 – 2019-04-22 (×5): 150 mg via ORAL
  Filled 2019-04-19 (×5): qty 1

## 2019-04-19 MED ORDER — ONDANSETRON HCL 4 MG/2ML IJ SOLN
INTRAMUSCULAR | Status: AC
  Filled 2019-04-19: qty 2

## 2019-04-19 MED ORDER — BUPIVACAINE-EPINEPHRINE (PF) 0.25% -1:200000 IJ SOLN
INTRAMUSCULAR | Status: DC | PRN
  Administered 2019-04-19: 30 mL via PERINEURAL

## 2019-04-19 MED ORDER — MENTHOL 3 MG MT LOZG: 1.0000 | LOZENGE | OROMUCOSAL | Status: DC | PRN

## 2019-04-19 MED ORDER — PHENOL 1.4 % MT LIQD
1.0000 | OROMUCOSAL | Status: DC | PRN
  Filled 2019-04-19: qty 177

## 2019-04-19 MED ORDER — ONDANSETRON HCL 4 MG/2ML IJ SOLN: 4.0000 mg | Freq: Once | INTRAMUSCULAR | Status: DC | PRN

## 2019-04-19 MED ORDER — METHOCARBAMOL 500 MG PO TABS
500.0000 mg | ORAL_TABLET | Freq: Four times a day (QID) | ORAL | Status: DC | PRN
  Administered 2019-04-19: 500 mg via ORAL
  Filled 2019-04-19: qty 1

## 2019-04-19 MED ORDER — NITROGLYCERIN 0.4 MG SL SUBL: 0.4000 mg | SUBLINGUAL_TABLET | SUBLINGUAL | Status: DC | PRN

## 2019-04-19 MED ORDER — FLEET ENEMA 7-19 GM/118ML RE ENEM: 1.0000 | ENEMA | Freq: Once | RECTAL | Status: DC | PRN

## 2019-04-19 MED ORDER — SODIUM CHLORIDE (PF) 0.9 % IJ SOLN
INTRAMUSCULAR | Status: AC
  Filled 2019-04-19: qty 50

## 2019-04-19 MED ORDER — ACETAMINOPHEN 10 MG/ML IV SOLN: 1000.0000 mg | Freq: Once | INTRAVENOUS | Status: DC | PRN

## 2019-04-19 MED ORDER — METOCLOPRAMIDE HCL 5 MG/ML IJ SOLN: 5.0000 mg | Freq: Three times a day (TID) | INTRAMUSCULAR | Status: DC | PRN

## 2019-04-19 MED ORDER — PROPOFOL 10 MG/ML IV BOLUS
INTRAVENOUS | Status: DC | PRN
  Administered 2019-04-19: 100 mg via INTRAVENOUS

## 2019-04-19 SURGICAL SUPPLY — 45 items
APL PRP STRL LF DISP 70% ISPRP (MISCELLANEOUS) ×1
BAG DECANTER FOR FLEXI CONT (MISCELLANEOUS) ×1 IMPLANT
BAG SPEC THK2 15X12 ZIP CLS (MISCELLANEOUS)
BAG ZIPLOCK 12X15 (MISCELLANEOUS) IMPLANT
BLADE SAG 18X100X1.27 (BLADE) ×2 IMPLANT
BLADE SURG SZ10 CARB STEEL (BLADE) ×4 IMPLANT
CHLORAPREP W/TINT 26 (MISCELLANEOUS) ×2 IMPLANT
COVER PERINEAL POST (MISCELLANEOUS) ×2 IMPLANT
COVER SURGICAL LIGHT HANDLE (MISCELLANEOUS) ×2 IMPLANT
COVER WAND RF STERILE (DRAPES) IMPLANT
CUP ACETBLR 48 OD SECTOR II (Hips) ×1 IMPLANT
DECANTER SPIKE VIAL GLASS SM (MISCELLANEOUS) ×2 IMPLANT
DRAPE STERI IOBAN 125X83 (DRAPES) ×2 IMPLANT
DRAPE U-SHAPE 47X51 STRL (DRAPES) ×4 IMPLANT
DRSG ADAPTIC 3X8 NADH LF (GAUZE/BANDAGES/DRESSINGS) ×2 IMPLANT
DRSG MEPILEX BORDER 4X4 (GAUZE/BANDAGES/DRESSINGS) ×2 IMPLANT
DRSG MEPILEX BORDER 4X8 (GAUZE/BANDAGES/DRESSINGS) ×2 IMPLANT
ELECT BLADE TIP CTD 4 INCH (ELECTRODE) ×2 IMPLANT
ELECT REM PT RETURN 15FT ADLT (MISCELLANEOUS) ×2 IMPLANT
EVACUATOR 1/8 PVC DRAIN (DRAIN) ×2 IMPLANT
GLOVE BIO SURGEON STRL SZ7 (GLOVE) ×2 IMPLANT
GLOVE BIO SURGEON STRL SZ8 (GLOVE) ×2 IMPLANT
GLOVE BIOGEL PI IND STRL 7.0 (GLOVE) ×1 IMPLANT
GLOVE BIOGEL PI IND STRL 8 (GLOVE) ×1 IMPLANT
GLOVE BIOGEL PI INDICATOR 7.0 (GLOVE) ×1
GLOVE BIOGEL PI INDICATOR 8 (GLOVE) ×1
GOWN STRL REUS W/TWL LRG LVL3 (GOWN DISPOSABLE) ×4 IMPLANT
HEAD FEM STD 28X+1.5 STRL (Hips) ×1 IMPLANT
HOLDER FOLEY CATH W/STRAP (MISCELLANEOUS) ×2 IMPLANT
KIT TURNOVER KIT A (KITS) IMPLANT
LINER MARATHON 28 48 (Hips) ×1 IMPLANT
MANIFOLD NEPTUNE II (INSTRUMENTS) ×2 IMPLANT
PACK ANTERIOR HIP CUSTOM (KITS) ×2 IMPLANT
STEM FEMORAL SZ5 HIGH ACTIS (Nail) ×1 IMPLANT
STRIP CLOSURE SKIN 1/2X4 (GAUZE/BANDAGES/DRESSINGS) ×3 IMPLANT
SUT ETHIBOND NAB CT1 #1 30IN (SUTURE) ×2 IMPLANT
SUT MNCRL AB 4-0 PS2 18 (SUTURE) ×2 IMPLANT
SUT STRATAFIX 0 PDS 27 VIOLET (SUTURE) ×2
SUT VIC AB 2-0 CT1 27 (SUTURE) ×4
SUT VIC AB 2-0 CT1 TAPERPNT 27 (SUTURE) ×2 IMPLANT
SUTURE STRATFX 0 PDS 27 VIOLET (SUTURE) ×1 IMPLANT
SYR 50ML LL SCALE MARK (SYRINGE) IMPLANT
TRAY FOLEY BAG SILVER LF 14FR (CATHETERS) ×1 IMPLANT
TRAY FOLEY MTR SLVR 16FR STAT (SET/KITS/TRAYS/PACK) ×1 IMPLANT
YANKAUER SUCT BULB TIP 10FT TU (MISCELLANEOUS) ×2 IMPLANT

## 2019-04-19 NOTE — Op Note (Signed)
OPERATIVE REPORT- TOTAL HIP ARTHROPLASTY   PREOPERATIVE DIAGNOSIS: Left femoral neck fracture   POSTOPERATIVE DIAGNOSIS: Left femoral neck fracture  PROCEDURE: Left total hip arthroplasty, anterior approach.   SURGEON: Gaynelle Arabian, MD   ASSISTANT: Griffith Citron, PA-C  ANESTHESIA:  General  ESTIMATED BLOOD LOSS:-200 mL    DRAINS: Hemovac x1.   COMPLICATIONS: None   CONDITION: PACU - hemodynamically stable.   BRIEF CLINICAL NOTE: [name] is a 85 y.o. female who had a fall 2 days ago sustaining a displaced left Femoral neck fracture. She presents now for Left total hip arthroplasty.   PROCEDURE IN DETAIL: After successful administration of General  anesthetic, the traction boots for the [employer] bed were placed on both  feet and the patient was placed onto the [facility] bed, boots placed into the leg  holders. The Left hip was then isolated from the perineum with plastic  drapes and prepped and draped in the usual sterile fashion. ASIS and  greater trochanter were marked and a oblique incision was made, starting  at about 1 cm lateral and 2 cm distal to the ASIS and coursing towards  the anterior cortex of the femur. The skin was cut with a 10 blade  through subcutaneous tissue to the level of the fascia overlying the  tensor fascia lata muscle. The fascia was then incised in line with the  incision at the junction of the anterior third and posterior 2/3rd. The  muscle was teased off the fascia and then the interval between the TFL  and the rectus was developed. The Hohmann retractor was then placed at  the top of the femoral neck over the capsule. The vessels overlying the  capsule were cauterized and the fat on top of the capsule was removed.  A Hohmann retractor was then placed anterior underneath the rectus  femoris to give exposure to the entire anterior capsule. A T-shaped  capsulotomy was performed. The edges were tagged and the femoral head  was  identified.       Osteophytes are removed off the superior acetabulum.  The femoral neck was then cut in situ with an oscillating saw. Traction  was then applied to the left lower extremity utilizing the [facility]  traction. The femoral head was then removed. Retractors were placed  around the acetabulum and then circumferential removal of the labrum was  performed. Osteophytes were also removed. Reaming starts at 45 mm to  medialize and  Increased in 2 mm increments to 47 mm. We reamed in  approximately 40 degrees of abduction, 20 degrees anteversion. A 48 mm  pinnacle acetabular shell was then impacted in anatomic position under  fluoroscopic guidance with excellent purchase. We did not need to place  any additional dome screws. A 28 mm neutral + 4 marathon liner was then  placed into the acetabular shell.       The femoral lift was then placed along the lateral aspect of the femur  just distal to the vastus ridge. The leg was  externally rotated and capsule  was stripped off the inferior aspect of the femoral neck down to the  level of the lesser trochanter, this was done with electrocautery. The femur was lifted after this was performed. The  leg was then placed in an extended and adducted position essentially delivering the femur. We also removed the capsule superiorly and the piriformis from the piriformis fossa to gain excellent exposure of the  proximal femur. Rongeur was used  to remove some cancellous bone to get  into the lateral portion of the proximal femur for placement of the  initial starter reamer. The starter broaches was placed  the starter broach  and was shown to go down the center of the canal. Broaching  with the Actis system was then performed starting at size 0  coursing  Up to size 5. A size 5 had excellent torsional and rotational  and axial stability. The trial high offset neck was then placed  with a 28 + 1.5 trial head. The hip was then reduced. We confirmed that  the  stem was in the canal both on AP and lateral x-rays. It also has excellent sizing. The hip was reduced with outstanding stability through full extension and full external rotation.. AP pelvis was taken and the leg lengths were measured and found to be equal. Hip was then dislocated again and the femoral head and neck removed. The  femoral broach was removed. Size 5 Actis stem with a high offset  neck was then impacted into the femur following native anteversion. Has  excellent purchase in the canal. Excellent torsional and rotational and  axial stability. It is confirmed to be in the canal on AP and lateral  fluoroscopic views. The 28 + 1.5 metal head was placed and the hip  reduced with outstanding stability. Again AP pelvis was taken and it  confirmed that the leg lengths were equal. The wound was then copiously  irrigated with saline solution and the capsule reattached and repaired  with Ethibond suture. 30 ml of .25% Bupivicaine was  injected into the capsule and into the edge of the tensor fascia lata as well as subcutaneous tissue. The fascia overlying the tensor fascia lata was then closed with a running #1 V-Loc. Subcu was closed with interrupted 2-0 Vicryl and subcuticular running 4-0 Monocryl. Incision was cleaned  and dried. Steri-Strips and a bulky sterile dressing applied. Hemovac  drain was hooked to suction and then the patient was awakened and transported to  recovery in stable condition.        Please note that a surgical assistant was a medical necessity for this procedure to perform it in a safe and expeditious manner. Assistant was necessary to provide appropriate retraction of vital neurovascular structures and to prevent femoral fracture and allow for anatomic placement of the prosthesis.  Gaynelle Arabian, M.D.

## 2019-04-19 NOTE — Progress Notes (Signed)
PT Note  Patient Details Name: [name] MRN: [id number] DOB: [birth year]   Chart reviewed and tried to awaken and work with pt. Pt groggy, however did wake to discuss that she had surgery and was in no pain at the moment. I began to collect history , pt aware of fall in her basement after getting dizzy after bending to changer her air purifier ( ?) .   Pt did refuse to sit EOB today, however agreed to begin moving tomorrow. I did perform PROM on R and L LE to encourge movement and blood flow as well as B ankle pumps.   Will return tomorrow morning to begin PT.   Clide Dales 04/19/2019, 6:11 PM  Clide Dales, PT Acute Rehabilitation Services Pager: [phone] Office: [phone] 04/19/2019

## 2019-04-19 NOTE — Anesthesia Procedure Notes (Signed)
Procedure Name: Intubation Date/Time: 04/19/2019 7:29 AM Performed by: K[place]ak Rollyson D, CRNA Pre-anesthesia Checklist: Patient identified, Emergency Drugs available, Suction available and Patient being monitored Patient Re-evaluated:Patient Re-evaluated prior to induction Oxygen Delivery Method: Circle system utilized Preoxygenation: Pre-oxygenation with 100% oxygen Induction Type: IV induction and Rapid sequence Laryngoscope Size: Mac and 4 Grade View: Grade I Tube type: Oral Tube size: 8.0 mm Number of attempts: 1 Airway Equipment and Method: Stylet Placement Confirmation: ETT inserted through vocal cords under direct vision,  positive ETCO2 and breath sounds checked- equal and bilateral Secured at: 21 cm Tube secured with: Tape Dental Injury: Teeth and Oropharynx as per pre-operative assessment

## 2019-04-19 NOTE — Progress Notes (Signed)
Per RN pt to go to MRI at 1630. EEG will be scheduled for first thing tomorrow 0800 04/20/2019 due to unable to get to [facility] before MRI.

## 2019-04-19 NOTE — Anesthesia Postprocedure Evaluation (Signed)
Anesthesia Post Note  Patient: [name]  Procedure(s) Performed: TOTAL HIP ARTHROPLASTY ANTERIOR APPROACH (Left )     Patient location during evaluation: PACU Anesthesia Type: General Level of consciousness: awake and alert Pain management: pain level controlled Vital Signs Assessment: post-procedure vital signs reviewed and stable Respiratory status: spontaneous breathing, nonlabored ventilation, respiratory function stable and patient connected to nasal cannula oxygen Cardiovascular status: blood pressure returned to baseline and stable Postop Assessment: no apparent nausea or vomiting Anesthetic complications: no    Last Vitals:  Vitals:   04/19/19 1311 04/19/19 1404  BP: 127/61 130/63  Pulse: 84 84  Resp: 18 18  Temp: 36.6 C 36.6 C  SpO2: 98% 100%    Last Pain:  Vitals:   04/19/19 1404  TempSrc: Oral  PainSc:                  Barnet Glasgow

## 2019-04-19 NOTE — Progress Notes (Signed)
Pt currently unavailable for EEG. Will attempt at a later time when schedule permits

## 2019-04-19 NOTE — Interval H&P Note (Signed)
History and Physical Interval Note:  04/19/2019 6:51 AM  [name]  has presented today for surgery, with the diagnosis of hip fracture.  The various methods of treatment have been discussed with the patient and family. After consideration of risks, benefits and other options for treatment, the patient has consented to  Procedure(s): TOTAL HIP ARTHROPLASTY ANTERIOR APPROACH (Left) as a surgical intervention.  The patient's history has been reviewed, patient examined, no change in status, stable for surgery.  I have reviewed the patient's chart and labs.  Questions were answered to the patient's satisfaction.     Pilar Plate Ruhani Umland

## 2019-04-19 NOTE — Transfer of Care (Signed)
Immediate Anesthesia Transfer of Care Note  Patient: [name]  Procedure(s) Performed: TOTAL HIP ARTHROPLASTY ANTERIOR APPROACH (Left )  Patient Location: PACU  Anesthesia Type:General  Level of Consciousness: awake, alert  and oriented  Airway & Oxygen Therapy: Patient Spontanous Breathing and Patient connected to face mask oxygen  Post-op Assessment: Report given to RN and Post -op Vital signs reviewed and stable  Post vital signs: Reviewed and stable  Last Vitals:  Vitals Value Taken Time  BP 151/65 04/19/2019  9:18 AM  Temp    Pulse 89 04/19/2019  9:23 AM  Resp 15 04/19/2019  9:23 AM  SpO2 90 % 04/19/2019  9:23 AM  Vitals shown include unvalidated device data.  Last Pain:  Vitals:   04/19/19 0613  TempSrc: Oral  PainSc:          Complications: No apparent anesthesia complications

## 2019-04-19 NOTE — Progress Notes (Signed)
I spoke with Dr. Learta Codding (pt son) this morning. Patient is in for orthopedic surgery today. We will try to get the EEG done today. I will check back again tomorrow morning, if she is back to baseline, MRI can be deferred at an outpatient facility because she is claustrophobic and does not like to get MRIs done at the hospital scanners.  I will continue to follow.  -- Amie Portland, MD [facility] Pager: [phone] If 7pm to 7am, please call on call as listed on AMION.

## 2019-04-19 NOTE — Progress Notes (Signed)
PROGRESS NOTE    [name]  ZOX:[id number] DOB: May 07, 1934 DOA: 04/17/2019 PCP: [name], MD   Brief Narrative:  HPI On 04/17/2019 by [name] [name] is a 85 y.o. female with medical history of A-flutter, cAD, DM2, HTN, HLD, CVA, vertigo, Complex migrains who presents after a fall and is found to have a left hip fracture.   On my eval, she is confused and unable to contribute to history. She is repeating "There is something not right, I am cold". She is not able to tell me her name or where she is.  Per history obtained from ED doctor, RN and her son, she fell this AM while walking in her basement and was brought in by EMS. She told the ED doctor that she did not hit her head. She was oriented when she first arrived in the ED but became confused while she was in the ED.  Per her son, Dr [name],  she has become confused from narcotics in the past. In addition she had an episode in 11/19 of change in speech and right frontal headache. CVA work up was negative and she was suspected to have complicated migraines. She was having 2-3 episodes a week which stopped after her Metfomin was discontinued.  Interim history Admitted for left hip fracture. Also had acute encephalopathy, which as resolved.  Assessment & Plan   Left hip fracture -s/p fall prior to admission -Left hip x-ray showed subcapital left femoral neck fracture -Orthopedic surgery consulted and appreciated, s/p left total hip arthoplasty, anterior approach -Patient currently on Pradaxa which has been held, with possible surgery planned for 04/19/2019 -Continue pain control  Acute metabolic encephalopathy -Resolved, patient currently alert and oriented x4 -CTA head x2, CTA head unremarkable -Neurology consulted and appreciated -UA unremarkable for infection -Chest x-ray unremarkable for infection -Possibly secondary to fall and hip pain versus possible medications vs ?migraine -EEG pending    Arthritis/chronic pain -Continue pain contorl   Diabetes mellitus, type II -Continue Lantus, insulin sliding scale and CBG monitoring  Coronary artery disease -History of CABG -Stable, no complaints of chest pain -Continue statin, Coreg -lisinopril held  Atrial flutter -Currently in sinus rhythm -Pradaxa held  Essential hypertension -Continue Coreg -Lisinopril held  Complicated migraines -stable  Insomnia  -Continue trazodone  DVT Prophylaxis  lovenox  Code Status: Full  Family Communication: None at bedside.   Disposition Plan: Admitted. S/p surgery. Dispo TBD  Consultants Orthopedic surgery Neurology  Procedures  Left total hip arthoplasty, anterior approach  Antibiotics   Anti-infectives (From admission, onward)   Start     Dose/Rate Route Frequency Ordered Stop   04/19/19 1300  clindamycin (CLEOCIN) IVPB 600 mg     600 mg 100 mL/hr over 30 Minutes Intravenous Every 6 hours 04/19/19 1046 04/19/19 1859   04/19/19 0715  clindamycin (CLEOCIN) IVPB 600 mg     600 mg 100 mL/hr over 30 Minutes Intravenous  Once 04/19/19 0709 04/19/19 0731   04/19/19 0700  clindamycin (CLEOCIN) IVPB 900 mg  Status:  Discontinued     900 mg 100 mL/hr over 30 Minutes Intravenous  Once 04/19/19 8119 04/19/19 1046      Subjective:   [name] seen and examined today.  Patient just returned from surgery. Feeling very sleepy. Has no complaints at this time.   Objective:   Vitals:   04/19/19 0945 04/19/19 1000 04/19/19 1015 04/19/19 1030  BP: (!) 145/66 (!) 146/78 139/63 (!) 131/58  Pulse: 85  84 84 82  Resp: 13 12 18 14   Temp:      TempSrc:      SpO2: 100% 97% 97% 100%  Weight:      Height:        Intake/Output Summary (Last 24 hours) at 04/19/2019 1048 Last data filed at 04/19/2019 1610 Gross per 24 hour  Intake 3813.35 ml  Output 1150 ml  Net 2663.35 ml   Filed Weights   04/18/19 0453  Weight: 81.9 kg   Exam  General: Well developed, well nourished,  NAD, appears stated age  HEENT: NCAT, mucous membranes moist.   Cardiovascular: S1 S2 auscultated, RRR  Respiratory: Clear to auscultation bilaterally  Abdomen: Soft, nontender, nondistended, + bowel sounds  Extremities: warm dry without cyanosis clubbing or edema  Neuro: AAOx3, nonfocal  Psych: Appropriate mood and affect  Data Reviewed: I have personally reviewed following labs and imaging studies  CBC: Recent Labs  Lab 04/17/19 1048 04/17/19 2126 04/18/19 0257 04/19/19 0325  WBC 10.0 10.1 9.4 8.3  NEUTROABS 7.8*  --   --   --   HGB 12.7 13.2 12.4 11.6*  HCT 40.0 39.8 38.0 36.7  MCV 88.9 88.6 89.0 92.2  PLT 156 154 151 108*   Basic Metabolic Panel: Recent Labs  Lab 04/17/19 1048 04/17/19 2126 04/18/19 0257 04/19/19 0325  NA 137 135 137 137  K 4.2 4.1 4.3 4.4  CL 107 104 106 109  CO2 24 23 24  19*  GLUCOSE 179* 149* 116* 162*  BUN 16 17 17 17   CREATININE 0.64 0.74 0.79 0.64  CALCIUM 8.6* 8.9 8.7* 8.3*   GFR: Estimated Creatinine Clearance: 52.8 mL/min (by C-G formula based on SCr of 0.64 mg/dL). Liver Function Tests: No results for input(s): AST, ALT, ALKPHOS, BILITOT, PROT, ALBUMIN in the last 168 hours. No results for input(s): LIPASE, AMYLASE in the last 168 hours. No results for input(s): AMMONIA in the last 168 hours. Coagulation Profile: Recent Labs  Lab 04/17/19 1206 04/17/19 2126  INR 1.5* 1.3*   Cardiac Enzymes: Recent Labs  Lab 04/17/19 1155 04/17/19 2126 04/18/19 0257 04/18/19 0843  TROPONINI <0.03 <0.03 <0.03 <0.03   BNP (last 3 results) No results for input(s): PROBNP in the last 8760 hours. HbA1C: No results for input(s): HGBA1C in the last 72 hours. CBG: Recent Labs  Lab 04/18/19 0749 04/18/19 1132 04/18/19 2116 04/19/19 0608 04/19/19 1008  GLUCAP 138* 147* 154* 149* 154*   Lipid Profile: No results for input(s): CHOL, HDL, LDLCALC, TRIG, CHOLHDL, LDLDIRECT in the last 72 hours. Thyroid Function Tests: No results  for input(s): TSH, T4TOTAL, FREET4, T3FREE, THYROIDAB in the last 72 hours. Anemia Panel: No results for input(s): VITAMINB12, FOLATE, FERRITIN, TIBC, IRON, RETICCTPCT in the last 72 hours. Urine analysis:    Component Value Date/Time   COLORURINE YELLOW 04/18/2019 0520   APPEARANCEUR CLEAR 04/18/2019 0520   LABSPEC 1.020 04/18/2019 0520   PHURINE 5.0 04/18/2019 0520   GLUCOSEU NEGATIVE 04/18/2019 0520   HGBUR TRACE (A) 04/18/2019 0520   BILIRUBINUR NEGATIVE 04/18/2019 0520   KETONESUR TRACE (A) 04/18/2019 0520   PROTEINUR NEGATIVE 04/18/2019 0520   UROBILINOGEN 0.2 09/05/2015 1531   NITRITE NEGATIVE 04/18/2019 0520   LEUKOCYTESUR NEGATIVE 04/18/2019 0520   Sepsis Labs: @LABRCNTIP (procalcitonin:4,lacticidven:4)  ) Recent Results (from the past 240 hour(s))  MRSA PCR Screening     Status: None   Collection Time: 04/17/19 11:45 PM  Result Value Ref Range Status   MRSA by PCR NEGATIVE NEGATIVE Final  Comment:        The GeneXpert MRSA Assay (FDA approved for NASAL specimens only), is one component of a comprehensive MRSA colonization surveillance program. It is not intended to diagnose MRSA infection nor to guide or monitor treatment for MRSA infections. Performed at [facility], [address] [place], [place] [address]       Radiology Studies: Ct Angio Head W Or Wo Contrast  Result Date: 04/17/2019 CLINICAL DATA:  85 year old female with worsening mental status status post fall with left femoral neck fracture. Increasing confusion. EXAM: CT ANGIOGRAPHY HEAD TECHNIQUE: Multidetector CT imaging of the head was performed using the standard protocol during bolus administration of intravenous contrast. Multiplanar CT image reconstructions and MIPs were obtained to evaluate the vascular anatomy. CONTRAST:  OMNIPAQUE IOHEXOL 350 MG/ML SOLN COMPARISON:  Noncontrast head CTs today 2003 hours and earlier. FINDINGS: Posterior circulation: Codominant  distal vertebral arteries are patent to the vertebrobasilar junction without stenosis. Normal PICA origins. Patent basilar artery without stenosis. Patent SCA origins. Normal right PCA origin. Fetal type left PCA origin. Right posterior communicating artery is diminutive or absent. Bilateral PCA branches are within normal limits. Anterior circulation: Tortuous distal cervical ICAs, more so on the right. Patent bilateral ICA siphons. On the left there is calcified atherosclerosis but only mild left siphon stenosis. Normal left ophthalmic and posterior communicating artery origins. On the right there is similar calcified atherosclerosis with mild right siphon stenosis. Patent carotid termini, MCA and ACA origins. Diminutive or absent anterior communicating artery. Bilateral ACA branches are within normal limits. Left MCA M1 segment and trifurcation are patent without stenosis. Left MCA branches are within normal limits. Right MCA M1 segment and trifurcation are patent without stenosis. Right MCA branches are within normal limits. Venous sinuses: Major venous structures appear to be patent on the delayed images. Anatomic variants: Fetal type left PCA origin. Delayed phase: No abnormal enhancement identified. Gray-white matter differentiation appears stable since 1249 hours earlier today, including chronic small left SCA territory infarct. IMPRESSION: 1. Negative for age intracranial CTA. No intracranial hemodynamically significant stenosis or arterial occlusion identified. 2. Stable CT appearance of the brain since 1249 hours today. Electronically Signed   By: Odessa Fleming M.D.   On: 04/17/2019 20:57   Ct Head Wo Contrast  Result Date: 04/17/2019 CLINICAL DATA:  New onset of confusion.  Blood thinners. EXAM: CT HEAD WITHOUT CONTRAST TECHNIQUE: Contiguous axial images were obtained from the base of the skull through the vertex without intravenous contrast. COMPARISON:  April 17, 2019 FINDINGS: Brain: No subdural,  epidural, or subarachnoid hemorrhage. Stable left superior cerebellar infarct. Cerebellum, brainstem, and basal cisterns are otherwise normal. Scattered white matter changes are noted. Ventricles and sulci are unremarkable. No mass effect or midline shift. No acute cortical ischemia or infarct. Vascular: No hyperdense vessel or unexpected calcification. Skull: Normal. Negative for fracture or focal lesion. Sinuses/Orbits: No acute finding. Other: None. IMPRESSION: 1. No acute intracranial abnormalities. Scattered white matter changes. Chronic left cerebellar infarct. No intracranial hemorrhage noted. Electronically Signed   By: Gerome Sam III M.D   On: 04/17/2019 13:18   Ct Head Wo Contrast  Result Date: 04/17/2019 CLINICAL DATA:  Unwitnessed fall. EXAM: CT HEAD WITHOUT CONTRAST TECHNIQUE: Contiguous axial images were obtained from the base of the skull through the vertex without intravenous contrast. COMPARISON:  Head CT and MRI 11/15/2018 FINDINGS: Brain: There is no evidence of acute infarct, intracranial hemorrhage, mass, midline shift, or extra-axial fluid collection. Mild cerebral  atrophy is within normal limits for age. A chronic superior left cerebellar infarct is unchanged. Patchy cerebral white matter hypodensities are unchanged and nonspecific but compatible with moderate chronic small vessel ischemic disease. Vascular: Calcified atherosclerosis at the skull base. No hyperdense vessel. Skull: No fracture or focal osseous lesion. Sinuses/Orbits: Visualized paranasal sinuses and mastoid air cells are clear. Bilateral cataract extraction is noted. Other: None. IMPRESSION: 1. No evidence of acute intracranial abnormality. 2. Moderate chronic small vessel ischemic disease. Electronically Signed   By: Sebastian Ache M.D.   On: 04/17/2019 11:36   Dg Pelvis Portable  Result Date: 04/19/2019 CLINICAL DATA:  Status post left hip replacement today. EXAM: PORTABLE PELVIS 1-2 VIEWS COMPARISON:   Intraoperative images earlier today. FINDINGS: Left hip arthroplasty is in place. Surgical drain is noted. No fracture or dislocation. Gas in the soft tissues from surgery noted. IMPRESSION: Status post left hip replacement.  No acute finding. Electronically Signed   By: Drusilla Kanner M.D.   On: 04/19/2019 10:10   Dg Chest Port 1 View  Result Date: 04/17/2019 CLINICAL DATA:  Unwitnessed fall. EXAM: PORTABLE CHEST 1 VIEW COMPARISON:  March 24, 2006 FINDINGS: The heart size and mediastinal contours are within normal limits. Both lungs are clear. The visualized skeletal structures are unremarkable. IMPRESSION: No active disease. Electronically Signed   By: Gerome Sam III M.D   On: 04/17/2019 11:32   Dg C-arm 1-60 Min-no Report  Result Date: 04/19/2019 Fluoroscopy was utilized by the requesting physician.  No radiographic interpretation.   Dg Hip Operative Unilat With Pelvis Left  Result Date: 04/19/2019 CLINICAL DATA:  Repair of left femoral neck fracture EXAM: OPERATIVE LEFT HIP (WITH PELVIS IF PERFORMED) 2 VIEWS TECHNIQUE: Fluoroscopic spot image(s) were submitted for interpretation post-operatively. COMPARISON:  04/17/2027 left hip radiographs FINDINGS: Fluoroscopy time 0 minutes 7 seconds. Two spot fluoroscopic intraoperative radiographs demonstrate postsurgical changes from left total hip arthroplasty with no evidence of left hip malalignment on these views. IMPRESSION: Intraoperative fluoroscopic guidance for left total hip arthroplasty. Electronically Signed   By: Delbert Phenix M.D.   On: 04/19/2019 08:53   Dg Hip Unilat W Or Wo Pelvis 2-3 Views Left  Result Date: 04/17/2019 CLINICAL DATA:  LEFT hip pain EXAM: DG HIP (WITH OR WITHOUT PELVIS) 2-3V LEFT COMPARISON:  None. FINDINGS: There is shortening of the LEFT femoral neck. There is linear superimposition of bone density along the inferior margin. These findings are consistent with LEFT femoral neck fracture. Fracture appears to be  subcapital in location. No significant angulation. There is bilateral joint space narrowing of the hip joints. Sclerosis of the acetabular roofs. IMPRESSION: 1. Subcapital LEFT femoral neck fracture. 2. Bilateral osteoarthritis of the hip joints. Electronically Signed   By: Genevive Bi M.D.   On: 04/17/2019 11:34   Ct Head Code Stroke Wo Contrast  Addendum Date: 04/17/2019   ADDENDUM REPORT: 04/17/2019 20:36 ADDENDUM: Study discussed by telephone with Dr. Stevie Kern on 04/17/2019 at 2032 hours. Electronically Signed   By: Odessa Fleming M.D.   On: 04/17/2019 20:36   Result Date: 04/17/2019 CLINICAL DATA:  Code stroke. 85 year old female status post fall earlier today, confusion. EXAM: CT HEAD WITHOUT CONTRAST TECHNIQUE: Contiguous axial images were obtained from the base of the skull through the vertex without intravenous contrast. COMPARISON:  Head CT 1249 hours today and earlier. FINDINGS: This exam is moderately degraded by motion artifact despite repeated imaging attempts. Brain: No intracranial mass effect or ventriculomegaly. No acute intracranial hemorrhage identified. Left  superior cerebellar infarct appears stable since November 2019. Supratentorial gray-white matter differentiation is stable within the limitations of this exam. Vascular: Calcified atherosclerosis at the skull base. Skull: No acute osseous abnormality identified. Sinuses/Orbits: Stable and generally well pneumatized. Other: No acute orbit or scalp soft tissue finding. ASPECTS [facility] Stroke Program Early CT Score) - suboptimal evaluation due to motion, estimated ASPECTS 10. IMPRESSION: 1. Moderately motion degraded exam despite repeated imaging attempts. 2. Grossly stable from head CTs earlier today, no acute intracranial abnormality identified. Electronically Signed: By: Odessa Fleming M.D. On: 04/17/2019 20:24     Scheduled Meds:  [MAR Hold] carvedilol  12.5 mg Oral BID WC   [START ON 04/20/2019] dabigatran  150 mg Oral BID     [START ON 04/20/2019] dexamethasone  10 mg Intravenous Once   docusate sodium  100 mg Oral BID   [MAR Hold] insulin aspart  0-5 Units Subcutaneous QHS   [MAR Hold] insulin glargine  20 Units Subcutaneous QHS   loratadine  10 mg Oral Daily   [MAR Hold] rosuvastatin  10 mg Oral QHS   [MAR Hold] sodium chloride flush  3 mL Intravenous Q12H   [MAR Hold] traZODone  25 mg Oral QHS   [MAR Hold] vitamin B-12  250 mcg Oral Daily   Continuous Infusions:  sodium chloride 100 mL/hr at 04/19/19 0341   clindamycin (CLEOCIN) IV     methocarbamol (ROBAXIN) IV       LOS: 2 days   Time Spent in minutes   30 minutes  Djuan Talton D.O. on 04/19/2019 at 10:48 AM  Between 7am to 7pm - Please see pager noted on [email or web address]  After 7pm go to [email or web address]  And look for the night coverage person covering for me after hours  [facility] Office  [phone]

## 2019-04-19 NOTE — Anesthesia Postprocedure Evaluation (Signed)
Anesthesia Post Note  Patient: [name]  Procedure(s) Performed: TOTAL HIP ARTHROPLASTY ANTERIOR APPROACH (Left )     Anesthesia Type: General    Last Vitals:  Vitals:   04/19/19 1311 04/19/19 1404  BP: 127/61 130/63  Pulse: 84 84  Resp: 18 18  Temp: 36.6 C 36.6 C  SpO2: 98% 100%    Last Pain:  Vitals:   04/19/19 1404  TempSrc: Oral  PainSc:                  Barnet Glasgow

## 2019-04-20 ENCOUNTER — Inpatient Hospital Stay (HOSPITAL_COMMUNITY)
Admit: 2019-04-20 | Discharge: 2019-04-20 | Disposition: A | Discharge status: Home / Self Care | Payer: Medicare Other | Attending: Primary Care | Admitting: Primary Care

## 2019-04-20 ENCOUNTER — Encounter (HOSPITAL_COMMUNITY): Payer: Self-pay | Admitting: Orthopedic Surgery

## 2019-04-20 DIAGNOSIS — W19XXXA Unspecified fall, initial encounter: Secondary | ICD-10-CM

## 2019-04-20 DIAGNOSIS — R4182 Altered mental status, unspecified: Secondary | ICD-10-CM

## 2019-04-20 LAB — BASIC METABOLIC PANEL
Anion gap: 6 (ref 5–15)
BUN: 24 mg/dL — ABNORMAL HIGH (ref 8–23)
CO2: 19 mmol/L — ABNORMAL LOW (ref 22–32)
Calcium: 8 mg/dL — ABNORMAL LOW (ref 8.9–10.3)
Chloride: 109 mmol/L (ref 98–111)
Creatinine, Ser: 0.66 mg/dL (ref 0.44–1.00)
GFR calc Af Amer: >60 mL/min (ref >60)
GFR calc non Af Amer: >60 mL/min (ref >60)
Glucose, Bld: 287 mg/dL — ABNORMAL HIGH (ref 70–99)
Potassium: 4.4 mmol/L (ref 3.5–5.1)
Sodium: 134 mmol/L — ABNORMAL LOW (ref 135–145)

## 2019-04-20 LAB — GLUCOSE, CAPILLARY
Glucose-Capillary: 245 mg/dL — ABNORMAL HIGH (ref 70–99)
Glucose-Capillary: 280 mg/dL — ABNORMAL HIGH (ref 70–99)
Glucose-Capillary: 402 mg/dL — ABNORMAL HIGH (ref 70–99)
Glucose-Capillary: 370 mg/dL — ABNORMAL HIGH (ref 70–99)

## 2019-04-20 LAB — CBC
HCT: 29.3 % — ABNORMAL LOW (ref 36.0–46.0)
Hemoglobin: 9.7 g/dL — ABNORMAL LOW (ref 12.0–15.0)
MCH: 29.8 pg (ref 26.0–34.0)
MCHC: 33.1 g/dL (ref 30.0–36.0)
MCV: 89.9 fL (ref 80.0–100.0)
Platelets: 109 10*3/uL — ABNORMAL LOW (ref 150–400)
RBC: 3.26 MIL/uL — ABNORMAL LOW (ref 3.87–5.11)
RDW: 12.4 % (ref 11.5–15.5)
WBC: 8.1 10*3/uL (ref 4.0–10.5)
nRBC: 0 % (ref 0.0–0.2)

## 2019-04-20 MED ORDER — TRAMADOL HCL 50 MG PO TABS: 50.0000 mg | ORAL_TABLET | Freq: Four times a day (QID) | ORAL | 0 refills | Status: DC | PRN

## 2019-04-20 MED ORDER — INSULIN ASPART 100 UNIT/ML ~~LOC~~ SOLN
0.0000 [IU] | Freq: Three times a day (TID) | SUBCUTANEOUS | Status: DC
  Administered 2019-04-20: 15 [IU] via SUBCUTANEOUS
  Administered 2019-04-21 (×3): 8 [IU] via SUBCUTANEOUS
  Administered 2019-04-22: 3 [IU] via SUBCUTANEOUS
  Administered 2019-04-22: 5 [IU] via SUBCUTANEOUS

## 2019-04-20 MED ORDER — STERILE WATER FOR INJECTION IV SOLN
INTRAVENOUS | Status: AC
  Administered 2019-04-20 – 2019-04-21 (×2): via INTRAVENOUS
  Filled 2019-04-20 (×2): qty 9.71

## 2019-04-20 MED ORDER — METHOCARBAMOL 500 MG PO TABS: 500.0000 mg | ORAL_TABLET | Freq: Four times a day (QID) | ORAL | 0 refills | Status: DC | PRN

## 2019-04-20 NOTE — Progress Notes (Signed)
Physical Therapy Treatment Patient Details Name: [name] MRN: [id number] DOB: [birth year] Today's Date: 04/20/2019    History of Present Illness 85 yo female admitted to ED on 4/18 s/p fall at home. S/p L DA-THA on 4/20. CT head negative for acute findings, ruled out acute CVA more in favor of complex migraine resulting in fall and confusion afterwards. PMH includes angina, OA, aflutter, CAD with CABG 2006, HTN, DM, headache, OP, CVA 2012, psoriasis with psoriatic arthritis, vertigo, lumbar disc surgery, L TKR.    PT Comments    Pt with continued slightly impaired cognition, as pt struggled with word finding and keeping her train of thought this session. Pt verbalizes to PT that she does not feel at cognitive baseline. Pt tolerated short ambulation distance in hallway this session, limited by fatigue, LLE weakness, and LLE pain. Pt reporting more L knee pain this session, stating is started bothering her around the time of the fall. PT instructed pt in LE strengthening exercises to improve pt LE strength. PT to continue to follow acutely.    Follow Up Recommendations  Home health PT;Supervision/Assistance - 24 hour     Equipment Recommendations  Rolling walker with 5" wheels;3in1 (PT)    Recommendations for Other Services       Precautions / Restrictions Precautions Precautions: Fall Restrictions Weight Bearing Restrictions: Yes LLE Weight Bearing: Weight bearing as tolerated    Mobility  Bed Mobility Overal bed mobility: Needs Assistance Bed Mobility: Supine to Sit;Sit to Supine     Supine to sit: HOB elevated;Min assist Sit to supine: Min assist;HOB elevated   General bed mobility comments: Min assist for supine<>Sit for LLE management, trunk elevation and lowering. Pt with use of overhead trapeze and bedrails. PT and PT aide assisted positioning pt upon return to supine, as pt with difficulty performing bridge to reposition self.  Transfers Overall transfer level:  Needs assistance Equipment used: Rolling walker (2 wheeled);1 person hand held assist Transfers: Sit to/from [employer] Sit to Stand: Min assist;From elevated surface;+2 safety/equipment Stand pivot transfers: Min assist;+2 safety/equipment;From elevated surface       General transfer comment: Min assist for power up and steadying for transfer to [facility], HHA+1. Pt additionally required min assist for power up from [facility], and recliner post-ambulation. Min VC for proper hand placement when rising.   Ambulation/Gait Ambulation/Gait assistance: Min assist;+2 safety/equipment Gait Distance (Feet): 20 Feet Assistive device: Rolling walker (2 wheeled) Gait Pattern/deviations: Decreased stride length;Antalgic;Trunk flexed;Step-through pattern Gait velocity: decr    General Gait Details: Min assist for steadying, pt with mild L knee buckling corrected by pt. Verbal cuing for upright posture. After 20 ft ambulation, pt sat in chair follow.    Stairs             Wheelchair Mobility    Modified Rankin (Stroke Patients Only)       Balance Overall balance assessment: Needs assistance;History of Falls Sitting-balance support: No upper extremity supported;Feet supported Sitting balance-Leahy Scale: Good     Standing balance support: Bilateral upper extremity supported Standing balance-Leahy Scale: Poor Standing balance comment: reliant on PT/RW for support.                             Cognition Arousal/Alertness: Awake/alert Behavior During Therapy: WFL for tasks assessed/performed Overall Cognitive Status: Within Functional Limits for tasks assessed Area of Impairment: Attention;Memory;Following commands  Current Attention Level: Selective Memory: Decreased short-term memory Following Commands: Follows one step commands consistently;Follows multi-step commands consistently       General Comments: Pt with some difficulty word  finding this session, with continued scattered thoughts. Pt having trouble remembering PT session from this AM, thought this PT was a PT she met yesterday. Pt states she realizes she is confused and not thinking clearly.      Exercises Total Joint Exercises Quad Sets: AROM;Left;10 reps;Supine Heel Slides: AROM;Left;10 reps;Supine    General Comments General comments (skin integrity, edema, etc.): abrasion on R knee, suspect from fall. Pt also with L hand swelling, pt states due to IV.       Pertinent Vitals/Pain Pain Assessment: Faces Faces Pain Scale: Hurts little more Pain Location: L hip, with ambulation Pain Descriptors / Indicators: Sore Pain Intervention(s): Limited activity within patient's tolerance;Monitored during session;Repositioned;Ice applied    Home Living                      Prior Function            PT Goals (current goals can now be found in the care plan section) Acute Rehab PT Goals Patient Stated Goal: go home with family PT Goal Formulation: With patient Time For Goal Achievement: 05/04/19 Potential to Achieve Goals: Good Progress towards PT goals: Progressing toward goals    Frequency    Min 5X/week      PT Plan Current plan remains appropriate    Co-evaluation              AM-PAC PT "6 Clicks" Mobility   Outcome Measure  Help needed turning from your back to your side while in a flat bed without using bedrails?: A Little Help needed moving from lying on your back to sitting on the side of a flat bed without using bedrails?: A Lot Help needed moving to and from a bed to a chair (including a wheelchair)?: A Little Help needed standing up from a chair using your arms (e.g., wheelchair or bedside chair)?: A Little Help needed to walk in hospital room?: A Little Help needed climbing 3-5 steps with a railing? : A Lot 6 Click Score: 16    End of Session Equipment Utilized During Treatment: Gait belt Activity Tolerance: Patient  limited by pain;Patient limited by fatigue Patient left: in bed;with bed alarm set;with SCD's reapplied(Pt requesting break from SCDs, pt instructed to perform ankle pumps to promote circulation) Nurse Communication: Mobility status PT Visit Diagnosis: Other abnormalities of gait and mobility (R26.89);Muscle weakness (generalized) (M62.81);History of falling (Z91.81)     Time: 1610-9604 PT Time Calculation (min) (ACUTE ONLY): 33 min  Charges:  $Gait Training: 8-22 mins $Therapeutic Activity: 8-22 mins                     Nicola Police, PT Acute Rehabilitation Services Pager [phone]  Office [phone]   Deema Juncaj D Despina Hidden 04/20/2019, 4:53 PM

## 2019-04-20 NOTE — Care Management Important Message (Signed)
Important Message  Patient Details IM Letter given to the Case Manager to present to the Patient Name: [name] MRN: [id number] Date of Birth: May 08, 1934   Medicare Important Message Given:  Yes    Kerin Salen 04/20/2019, 10:17 AMImportant Message  Patient Details  Name: [name] MRN: [id number] Date of Birth: 1934/12/13   Medicare Important Message Given:  Yes    Kerin Salen 04/20/2019, 10:17 AM

## 2019-04-20 NOTE — Plan of Care (Signed)
  Problem: Clinical Measurements: Goal: Postoperative complications will be avoided or minimized Outcome: Progressing   Problem: Pain Management: Goal: Pain level will decrease Outcome: Progressing   Problem: Education: Goal: Verbalization of understanding the information provided (i.e., activity precautions, restrictions, etc) will improve Outcome: Progressing Goal: Individualized Educational Video(s) Outcome: Progressing   Problem: Education: Goal: Knowledge of General Education information will improve Description Including pain rating scale, medication(s)/side effects and non-pharmacologic comfort measures Outcome: Progressing   Problem: Pain Management: Goal: Pain level will decrease Outcome: Progressing   Problem: Nutrition: Goal: Adequate nutrition will be maintained Outcome: Progressing

## 2019-04-20 NOTE — Progress Notes (Signed)
Neurology Progress Note   S:// Patient seen and examined.  Status post hip arthroplasty for the hip fracture. Mentation wise, back to baseline.   O:// Current vital signs: BP (!) 148/64 (BP Location: Right Arm)   Pulse 80   Temp 98.5 F (36.9 C) (Oral)   Resp 16   Ht 5\' 2"  (1.575 m)   Wt 81.9 kg   SpO2 98%   BMI 33.02 kg/m  Vital signs in last 24 hours: Temp:  [97.8 F (36.6 C)-98.9 F (37.2 C)] 98.5 F (36.9 C) (04/21 1129) Pulse Rate:  [80-99] 80 (04/21 1129) Resp:  [16-20] 16 (04/21 1129) BP: (127-148)/(56-69) 148/64 (04/21 1129) SpO2:  [96 %-100 %] 98 % (04/21 1129) General: Awake alert in no distress HEENT: Normocephalic atraumatic CVS: Respiratory regular rate rhythm Lungs: Clear to auscultation Extremities: Extremely cautious about letting me examine her lower extremities and said should not be examined unless absolutely necessary-I deferred lower extremity exam. Neurological exam Awake alert oriented x3 Naming comprehension repetition intact No dysarthria Cranial nerves: Pupils equal round reactive to light, extraocular movements intact, visual fields full, face symmetric, auditory acuity decreased bilaterally, palate midline tongue midline. Motor exam: Antigravity symmetric in both upper extremities Did not want to participate with lower extremity exam due to the recent surgery. Sensory exam: Intact light touch all over Coordination-no dysmetria  Medications  Current Facility-Administered Medications:  .  acetaminophen (TYLENOL) tablet 650 mg, 650 mg, Oral, Q6H PRN, 650 mg at 04/18/19 0811 **OR** acetaminophen (TYLENOL) suppository 650 mg, 650 mg, Rectal, Q6H PRN, Aluisio, Pilar Plate, MD .  bisacodyl (DULCOLAX) suppository 10 mg, 10 mg, Rectal, Daily PRN, Aluisio, Pilar Plate, MD .  carvedilol (COREG) tablet 12.5 mg, 12.5 mg, Oral, BID WC, Aluisio, Frank, MD, 12.5 mg at 04/20/19 1013 .  clonazePAM (KLONOPIN) tablet 0.5 mg, 0.5 mg, Oral, QHS PRN, Aluisio, Frank, MD .   cyclobenzaprine (FLEXERIL) tablet 10 mg, 10 mg, Oral, TID PRN, Gaynelle Arabian, MD .  dabigatran (PRADAXA) capsule 150 mg, 150 mg, Oral, BID, Aluisio, Frank, MD, 150 mg at 04/20/19 1012 .  diphenhydrAMINE (BENADRYL) 12.5 MG/5ML elixir 12.5-25 mg, 12.5-25 mg, Oral, Q4H PRN, Aluisio, Frank, MD .  docusate sodium (COLACE) capsule 100 mg, 100 mg, Oral, BID, Aluisio, Frank, MD, 100 mg at 04/20/19 1013 .  hydrALAZINE (APRESOLINE) injection 10 mg, 10 mg, Intravenous, Q6H PRN, Aluisio, Pilar Plate, MD .  HYDROcodone-acetaminophen (NORCO/VICODIN) 5-325 MG per tablet 1 tablet, 1 tablet, Oral, Q6H PRN, Gaynelle Arabian, MD, 1 tablet at 04/19/19 2157 .  insulin aspart (novoLOG) injection 0-5 Units, 0-5 Units, Subcutaneous, QHS, Gaynelle Arabian, MD, 4 Units at 04/19/19 2203 .  insulin glargine (LANTUS) injection 20 Units, 20 Units, Subcutaneous, QHS, Aluisio, Frank, MD, 20 Units at 04/19/19 2200 .  loratadine (CLARITIN) tablet 10 mg, 10 mg, Oral, Daily, Aluisio, Frank, MD, 10 mg at 04/20/19 1013 .  menthol-cetylpyridinium (CEPACOL) lozenge 3 mg, 1 lozenge, Oral, PRN **OR** phenol (CHLORASEPTIC) mouth spray 1 spray, 1 spray, Mouth/Throat, PRN, Aluisio, Pilar Plate, MD .  methocarbamol (ROBAXIN) tablet 500 mg, 500 mg, Oral, Q6H PRN, 500 mg at 04/19/19 2157 **OR** methocarbamol (ROBAXIN) 500 mg in dextrose 5 % 50 mL IVPB, 500 mg, Intravenous, Q6H PRN, Aluisio, Pilar Plate, MD .  metoCLOPramide (REGLAN) tablet 5-10 mg, 5-10 mg, Oral, Q8H PRN **OR** metoCLOPramide (REGLAN) injection 5-10 mg, 5-10 mg, Intravenous, Q8H PRN, Aluisio, Frank, MD .  nitroGLYCERIN (NITROSTAT) SL tablet 0.4 mg, 0.4 mg, Sublingual, Q5 Min x 3 PRN, Gaynelle Arabian, MD .  ondansetron [facility]) tablet  4 mg, 4 mg, Oral, Q6H PRN **OR** ondansetron (ZOFRAN) injection 4 mg, 4 mg, Intravenous, Q6H PRN, Gaynelle Arabian, MD, 4 mg at 04/17/19 1646 .  polyethylene glycol (MIRALAX / GLYCOLAX) packet 17 g, 17 g, Oral, Daily PRN, Aluisio, Frank, MD .  rosuvastatin (CRESTOR) tablet 10  mg, 10 mg, Oral, QHS, Aluisio, Frank, MD, 10 mg at 04/19/19 2157 .  senna (SENOKOT) tablet 8.6 mg, 1 tablet, Oral, QHS PRN, Aluisio, Frank, MD .  sodium chloride 0.225 % with sodium bicarbonate 50 mEq infusion, , Intravenous, Continuous, Cristal Ford, DO, Last Rate: 75 mL/hr at 04/20/19 1000 .  sodium chloride flush (NS) 0.9 % injection 3 mL, 3 mL, Intravenous, Q12H, Aluisio, Frank, MD, 3 mL at 04/19/19 2206 .  sodium chloride flush (NS) 0.9 % injection 3 mL, 3 mL, Intravenous, PRN, Aluisio, Frank, MD .  sodium phosphate (FLEET) 7-19 GM/118ML enema 1 enema, 1 enema, Rectal, Once PRN, Aluisio, Pilar Plate, MD .  traMADol Veatrice Bourbon) tablet 50-100 mg, 50-100 mg, Oral, Q6H PRN, Gaynelle Arabian, MD .  traZODone (DESYREL) tablet 25 mg, 25 mg, Oral, QHS, Aluisio, Pilar Plate, MD, 25 mg at 04/19/19 2157 .  vitamin B-12 (CYANOCOBALAMIN) tablet 250 mcg, 250 mcg, Oral, Daily, Aluisio, Frank, MD, 250 mcg at 04/20/19 1012 Labs CBC    Component Value Date/Time   WBC 8.1 04/20/2019 0428   RBC 3.26 (L) 04/20/2019 0428   HGB 9.7 (L) 04/20/2019 0428   HGB 13.0 01/27/2017 1450   HGB 12.5 07/27/2012 1305   HCT 29.3 (L) 04/20/2019 0428   HCT 40.4 01/27/2017 1450   HCT 36.2 07/27/2012 1305   PLT 109 (L) 04/20/2019 0428   PLT 176 01/27/2017 1450   MCV 89.9 04/20/2019 0428   MCV 87 01/27/2017 1450   MCV 92 07/27/2012 1305   MCH 29.8 04/20/2019 0428   MCHC 33.1 04/20/2019 0428   RDW 12.4 04/20/2019 0428   RDW 13.8 01/27/2017 1450   RDW 12.4 07/27/2012 1305   LYMPHSABS 1.6 04/17/2019 1048   LYMPHSABS 2.7 07/27/2012 1305   MONOABS 0.5 04/17/2019 1048   EOSABS 0.1 04/17/2019 1048   EOSABS 0.2 07/27/2012 1305   BASOSABS 0.0 04/17/2019 1048   BASOSABS 0.0 07/27/2012 1305    CMP     Component Value Date/Time   NA 134 (L) 04/20/2019 0428   NA 142 01/27/2017 1450   K 4.4 04/20/2019 0428   CL 109 04/20/2019 0428   CO2 19 (L) 04/20/2019 0428   GLUCOSE 287 (H) 04/20/2019 0428   BUN 24 (H) 04/20/2019 0428   BUN 18  01/27/2017 1450   CREATININE 0.66 04/20/2019 0428   CALCIUM 8.0 (L) 04/20/2019 0428   PROT 6.5 11/15/2018 1757   ALBUMIN 3.7 11/15/2018 1757   AST 20 11/15/2018 1757   ALT 16 11/15/2018 1757   ALKPHOS 72 11/15/2018 1757   BILITOT 0.3 11/15/2018 1757   GFRNONAA >60 04/20/2019 0428   GFRAA >60 04/20/2019 0428    EEG done this morning is normal with no focal findings.  Imaging I have reviewed images in epic and the results pertinent to this consultation are:  CT head and CTA head done on admission unremarkable for acute process   Assessment: 85 year old woman past medical history of a stroke in 2012, hyperlipidemia, hypertension, diabetes, coronary artery disease, atrial flutter on Pradaxa and possible complicated migraines presented to [facility] long hospital after she sustained a fall and fractured her hip. This fall was followed by some confusion and lethargy which are out of  character for her.  This started to improve but then worsened after she took a nap and again appeared confused. Due to the fluctuating nature of symptoms, code stroke was called and she was evaluated for the potential fluctuating LVO which was negative. Other thought processes were that this could be amyloid spells versus complex migraine or migraine equivalents. I have had discussion with her son, Dr. Illene Regulus about possibly starting her on preventative treatments for migraine with medication such as Topamax but at this time, given recent postop status and the need for pain medications or medications that might make her more sedated, we have decided to not make any changes to her current regimen and follow-up outpatient.   Impression: -Altered mental status in the setting of vertigo, possibly leading to a fall and hip fracture. -Possible complex migraine -Concern for underlying amyloid spells-patient very claustrophobic and only wants outpatient open MRI  Recommendations: -She is back to baseline-I will defer  the decision for MRI for her outpatient neurologist -I would not add any medications at this time but would let the outpatient neurologist make a decision on any preventative treatment for her migraines. -Her MRI from last year did not show any evidence of amyloid angiopathy and I am not expecting to see any amyloid-looking changes on the GRE images current MRI done now, but if she continues to have these episodes, further imaging after certain interval might provide some insight. -EEGs have been unremarkable so far.  Telemetry has been unremarkable so far. -Outpatient echocardiogram should also be considered -I discussed this in detail with her son over the phone. -Inpatient neurology will be available as needed -Resume anticoagulation when okay with orthopedics Please call with questions  -- Amie Portland, MD [facility] Pager: [phone] If 7pm to 7am, please call on call as listed on AMION.

## 2019-04-20 NOTE — Discharge Instructions (Signed)
°Dr. Frank Aluisio °Total Joint Specialist °[facility] °[address] °[place], [place] [address] °[phone] ° °ANTERIOR APPROACH TOTAL HIP REPLACEMENT POSTOPERATIVE DIRECTIONS ° ° °Hip Rehabilitation, Guidelines Following Surgery  °The results of a hip operation are greatly improved after range of motion and muscle strengthening exercises. Follow all safety measures which are given to protect your hip. If any of these exercises cause increased pain or swelling in your joint, decrease the amount until you are comfortable again. Then slowly increase the exercises. Call your caregiver if you have problems or questions.  ° °HOME CARE INSTRUCTIONS  °• Remove items at home which could result in a fall. This includes throw rugs or furniture in walking pathways.  °· ICE to the affected hip every three hours for 30 minutes at a time and then as needed for pain and swelling.  Continue to use ice on the hip for pain and swelling from surgery. You may notice swelling that will progress down to the foot and ankle.  This is normal after surgery.  Elevate the leg when you are not up walking on it.   °· Continue to use the breathing machine which will help keep your temperature down.  It is common for your temperature to cycle up and down following surgery, especially at night when you are not up moving around and exerting yourself.  The breathing machine keeps your lungs expanded and your temperature down. ° °DIET °You may resume your previous home diet once your are discharged from the hospital. ° °DRESSING / WOUND CARE / SHOWERING °You may change your dressing 3-5 days after surgery.  Then change the dressing every day with sterile gauze.  Please use good hand washing techniques before changing the dressing.  Do not use any lotions or creams on the incision until instructed by your surgeon. °You may start showering once you are discharged home but do not submerge the incision under water. Just pat the  incision dry and apply a dry gauze dressing on daily. °Change the surgical dressing daily and reapply a dry dressing each time. ° °ACTIVITY °Walk with your walker as instructed. °Use walker as long as suggested by your caregivers. °Avoid periods of inactivity such as sitting longer than an hour when not asleep. This helps prevent blood clots.  °You may resume a sexual relationship in one month or when given the OK by your doctor.  °You may return to work once you are cleared by your doctor.  °Do not drive a car for 6 weeks or until released by you surgeon.  °Do not drive while taking narcotics. ° °WEIGHT BEARING °Weight bearing as tolerated with assist device (walker, cane, etc) as directed, use it as long as suggested by your surgeon or therapist, typically at least 4-6 weeks. ° °POSTOPERATIVE CONSTIPATION PROTOCOL °Constipation - defined medically as fewer than three stools per week and severe constipation as less than one stool per week. ° °One of the most common issues patients have following surgery is constipation.  Even if you have a regular bowel pattern at home, your normal regimen is likely to be disrupted due to multiple reasons following surgery.  Combination of anesthesia, postoperative narcotics, change in appetite and fluid intake all can affect your bowels.  In order to avoid complications following surgery, here are some recommendations in order to help you during your recovery period. ° °Colace (docusate) - Pick up an over-the-counter form of Colace or another stool softener and take twice a day   as long as you are requiring postoperative pain medications.  Take with a full glass of water daily.  If you experience loose stools or diarrhea, hold the colace until you stool forms back up.  If your symptoms do not get better within 1 week or if they get worse, check with your doctor. ° °Dulcolax (bisacodyl) - Pick up over-the-counter and take as directed by the product packaging as needed to assist with  the movement of your bowels.  Take with a full glass of water.  Use this product as needed if not relieved by Colace only.  ° °MiraLax (polyethylene glycol) - Pick up over-the-counter to have on hand.  MiraLax is a solution that will increase the amount of water in your bowels to assist with bowel movements.  Take as directed and can mix with a glass of water, juice, soda, coffee, or tea.  Take if you go more than two days without a movement. °Do not use MiraLax more than once per day. Call your doctor if you are still constipated or irregular after using this medication for 7 days in a row. ° °If you continue to have problems with postoperative constipation, please contact the office for further assistance and recommendations.  If you experience "the worst abdominal pain ever" or develop nausea or vomiting, please contact the office immediatly for further recommendations for treatment. ° °ITCHING ° If you experience itching with your medications, try taking only a single pain pill, or even half a pain pill at a time.  You can also use Benadryl over the counter for itching or also to help with sleep.  ° °TED HOSE STOCKINGS °Wear the elastic stockings on both legs for three weeks following surgery during the day but you may remove then at night for sleeping. ° °MEDICATIONS °See your medication summary on the “After Visit Summary” that the nursing staff will review with you prior to discharge.  You may have some home medications which will be placed on hold until you complete the course of blood thinner medication.  It is important for you to complete the blood thinner medication as prescribed by your surgeon.  Continue your approved medications as instructed at time of discharge. ° °PRECAUTIONS °If you experience chest pain or shortness of breath - call 911 immediately for transfer to the hospital emergency department.  °If you develop a fever greater that 101 F, purulent drainage from wound, increased redness or  drainage from wound, foul odor from the wound/dressing, or calf pain - CONTACT YOUR SURGEON.   °                                                °FOLLOW-UP APPOINTMENTS °Make sure you keep all of your appointments after your operation with your surgeon and caregivers. You should call the office at the above phone number and make an appointment for approximately two weeks after the date of your surgery or on the date instructed by your surgeon outlined in the "After Visit Summary". ° °RANGE OF MOTION AND STRENGTHENING EXERCISES  °These exercises are designed to help you keep full movement of your hip joint. Follow your caregiver's or physical therapist's instructions. Perform all exercises about fifteen times, three times per day or as directed. Exercise both hips, even if you have had only one joint replacement. These exercises can be done on   a training (exercise) mat, on the floor, on a table or on a bed. Use whatever works the best and is most comfortable for you. Use music or television while you are exercising so that the exercises are a pleasant break in your day. This will make your life better with the exercises acting as a break in routine you can look forward to.   Lying on your back, slowly slide your foot toward your buttocks, raising your knee up off the floor. Then slowly slide your foot back down until your leg is straight again.   Lying on your back spread your legs as far apart as you can without causing discomfort.   Lying on your side, raise your upper leg and foot straight up from the floor as far as is comfortable. Slowly lower the leg and repeat.   Lying on your back, tighten up the muscle in the front of your thigh (quadriceps muscles). You can do this by keeping your leg straight and trying to raise your heel off the floor. This helps strengthen the largest muscle supporting your knee.   Lying on your back, tighten up the muscles of your buttocks both with the legs straight and with  the knee bent at a comfortable angle while keeping your heel on the floor.   IF YOU ARE TRANSFERRED TO A SKILLED REHAB FACILITY If the patient is transferred to a skilled rehab facility following release from the hospital, a list of the current medications will be sent to the facility for the patient to continue.  When discharged from the skilled rehab facility, please have the facility set up the patient's [place] prior to being released. Also, the skilled facility will be responsible for providing the patient with their medications at time of release from the facility to include their pain medication, the muscle relaxants, and their blood thinner medication. If the patient is still at the rehab facility at time of the two week follow up appointment, the skilled rehab facility will also need to assist the patient in arranging follow up appointment in our office and any transportation needs.  MAKE SURE YOU:   Understand these instructions.   Get help right away if you are not doing well or get worse.    Pick up stool softner and laxative for home use following surgery while on pain medications. Do not submerge incision under water. Please use good hand washing techniques while changing dressing each day. May shower starting three days after surgery. Please use a clean towel to pat the incision dry following showers. Continue to use ice for pain and swelling after surgery. Do not use any lotions or creams on the incision until instructed by your surgeon.

## 2019-04-20 NOTE — Progress Notes (Signed)
EEG complete - results pending 

## 2019-04-20 NOTE — Progress Notes (Signed)
Inpatient Diabetes Program Recommendations  AACE/ADA: New Consensus Statement on Inpatient Glycemic Control (2015)  Target Ranges:  Prepandial:   less than 140 mg/dL      Peak postprandial:   less than 180 mg/dL (1-2 hours)      Critically ill patients:  140 - 180 mg/dL   Lab Results  Component Value Date   GLUCAP 245 (H) 04/20/2019   HGBA1C 8.3 (H) 08/05/2014    Review of Glycemic Control  Diabetes history: DM2 Outpatient Diabetes medications: Lantus 50 units QHS, Victoza 1.8 mg QD Current orders for Inpatient glycemic control: Lantus 20 units QHS, Novolog 0-5 units QHS  No updated HgbA1C available Needs Novolog correction throughout the day for optimal glucose control.  Inpatient Diabetes Program Recommendations:     Add Novolog 0-15 units tidwc (HS currently ordered) Increase Lantus to 25 units QHS  Will continue to follow throughout hospitalization.  Thank you. Lorenda Peck, RD, LDN, CDE Inpatient Diabetes Coordinator [phone]

## 2019-04-20 NOTE — Evaluation (Signed)
Physical Therapy Evaluation Patient Details Name: [name] MRN: [id number] DOB: Feb 04, 1934 Today's Date: 04/20/2019   History of Present Illness  85 yo female admitted to ED on 4/18 s/p fall at home. S/p L DA-THA on 4/20. CT head negative for acute findings, ruled out acute CVA more in favor of complex migraine resulting in fall and confusion afterwards. PMH includes angina, OA, aflutter, CAD with CABG 2006, HTN, DM, headache, OP, CVA 2012, psoriasis with psoriatic arthritis, vertigo, lumbar disc surgery, L TKR.  Clinical Impression   Pt presents with L hip pain with ambulation, difficulty performing mobility tasks, LE weakness post-surgically, and decreased activity tolerance. Pt to benefit from acute PT to address deficits. Pt ambulated short room distance with min assist for steadying, verbal cuing for form and safety provided throughout. Pt educated on ankle pumps (20/hour) to perform this afternoon/evening to increase circulation, to pt's tolerance and limited by pain. Pt states she will d/c home with her son and his family, and they can provide required assist at d/c. PT recommending HHPT to address deficits post-acutely. PT to progress mobility as tolerated, and will continue to follow acutely.        Follow Up Recommendations Home health PT;Supervision/Assistance - 24 hour    Equipment Recommendations  Rolling walker with 5" wheels;3in1 (PT)    Recommendations for Other Services       Precautions / Restrictions Precautions Precautions: Fall Restrictions Weight Bearing Restrictions: Yes LLE Weight Bearing: Weight bearing as tolerated      Mobility  Bed Mobility Overal bed mobility: Needs Assistance Bed Mobility: Supine to Sit     Supine to sit: Mod assist;HOB elevated     General bed mobility comments: mod assist for trunk elevation, LE management, and scooting to EOB with use of bed pad.   Transfers Overall transfer level: Needs assistance Equipment used:  Rolling walker (2 wheeled);1 person hand held assist Transfers: Sit to/from [employer] Sit to Stand: Min assist;From elevated surface;+2 safety/equipment Stand pivot transfers: Min assist;+2 safety/equipment;From elevated surface       General transfer comment: Min assist for power up, steadying. Pt initially performed stand pivot to [facility] with HHA and steadying assist. Verbal cuing for hand placement when rising from [facility] to RW. Pt with increased time to steady.   Ambulation/Gait Ambulation/Gait assistance: Min assist;+2 safety/equipment Gait Distance (Feet): 5 Feet Assistive device: Rolling walker (2 wheeled) Gait Pattern/deviations: Step-to pattern;Decreased stride length;Antalgic;Trunk flexed Gait velocity: decr    General Gait Details: Min assist for steadying pt/RW and guiding pt to recliner, verbal cuing for slow eccentric lowering and placement in RW.   Stairs            Wheelchair Mobility    Modified Rankin (Stroke Patients Only)       Balance Overall balance assessment: Needs assistance;History of Falls Sitting-balance support: No upper extremity supported;Feet supported Sitting balance-Leahy Scale: Good     Standing balance support: Bilateral upper extremity supported Standing balance-Leahy Scale: Poor Standing balance comment: reliant on PT/RW for support.                              Pertinent Vitals/Pain Pain Assessment: 0-10 Pain Score: 6  Pain Location: L hip, with ambulation Pain Descriptors / Indicators: Sore Pain Intervention(s): Limited activity within patient's tolerance;Monitored during session;Repositioned;Ice applied    Home Living Family/patient expects to be discharged to:: Private residence Living Arrangements: Spouse/significant other Available Help at  Discharge: Family;Available 24 hours/day(Pt will d/c to son's house, below information in regards to this) Type of Home: House Home Access: Stairs to  enter Entrance Stairs-Rails: None Entrance Stairs-Number of Steps: 1 Home Layout: Two level;Able to live on main level with bedroom/bathroom Home Equipment: Walker - 4 wheels Additional Comments: Pt typically lives with husband, who has 24/7 care.     Prior Function Level of Independence: Independent         Comments: Pt reports not needing AD unless she is walking a lot.      Hand Dominance   Dominant Hand: Right    Extremity/Trunk Assessment   Upper Extremity Assessment Upper Extremity Assessment: Generalized weakness    Lower Extremity Assessment Lower Extremity Assessment: Generalized weakness;LLE deficits/detail LLE Deficits / Details: suspected post-surgical weakness; able to perform ankle pumps, limited heel slide, quad set  LLE Sensation: WNL    Cervical / Trunk Assessment Cervical / Trunk Assessment: Normal  Communication   Communication: HOH  Cognition Arousal/Alertness: Awake/alert Behavior During Therapy: WFL for tasks assessed/performed Overall Cognitive Status: Within Functional Limits for tasks assessed Area of Impairment: Attention;Memory;Following commands                   Current Attention Level: Selective Memory: Decreased short-term memory Following Commands: Follows one step commands consistently;Follows multi-step commands consistently       General Comments: Pt somewhat scattered with thoughts, appeared to get flustered when attempting to recall some things to PT. Otherwise, pt A&O x4, WFL command following.      General Comments General comments (skin integrity, edema, etc.): abrasion on R knee, suspect from fall. Pt also with L hand swelling, pt states due to IV.     Exercises     Assessment/Plan    PT Assessment Patient needs continued PT services  PT Problem List Decreased strength;Decreased mobility;Decreased activity tolerance;Decreased balance;Decreased knowledge of use of DME;Pain       PT Treatment Interventions  DME instruction;Functional mobility training;Balance training;Gait training;Therapeutic activities;Stair training;Therapeutic exercise;Patient/family education    PT Goals (Current goals can be found in the Care Plan section)  Acute Rehab PT Goals Patient Stated Goal: go home with family PT Goal Formulation: With patient Time For Goal Achievement: 05/04/19 Potential to Achieve Goals: Good    Frequency Min 5X/week   Barriers to discharge        Co-evaluation               AM-PAC PT "6 Clicks" Mobility  Outcome Measure Help needed turning from your back to your side while in a flat bed without using bedrails?: A Little Help needed moving from lying on your back to sitting on the side of a flat bed without using bedrails?: A Lot Help needed moving to and from a bed to a chair (including a wheelchair)?: A Little Help needed standing up from a chair using your arms (e.g., wheelchair or bedside chair)?: A Little Help needed to walk in hospital room?: A Little Help needed climbing 3-5 steps with a railing? : A Lot 6 Click Score: 16    End of Session Equipment Utilized During Treatment: Gait belt Activity Tolerance: Patient limited by pain;Patient tolerated treatment well Patient left: in chair;with chair alarm set;with call bell/phone within reach(Pt requesting break from SCDs, pt instructed to perform ankle pumps to promote circulation) Nurse Communication: Mobility status PT Visit Diagnosis: Other abnormalities of gait and mobility (R26.89);Muscle weakness (generalized) (M62.81);History of falling (Z91.81)    Time: 4098-1191 PT  Time Calculation (min) (ACUTE ONLY): 30 min   Charges:   PT Evaluation $PT Eval Low Complexity: 1 Low PT Treatments $Therapeutic Activity: 8-22 mins       Chennel Olivos Terrial Rhodes, PT Acute Rehabilitation Services Pager [phone]  Office [phone]   Nala Kachel D Shabana Armentrout 04/20/2019, 1:16 PM

## 2019-04-20 NOTE — Progress Notes (Signed)
   Subjective: 1 Day Post-Op Procedure(s) (LRB): TOTAL HIP ARTHROPLASTY ANTERIOR APPROACH (Left) Patient reports pain as mild.  Pre-op hip pain is essentially gone We will start therapy today.  Plan is to go Home after hospital stay.  Objective: Vital signs in last 24 hours: Temp:  [97.8 F (36.6 C)-98.9 F (37.2 C)] 98.9 F (37.2 C) (04/21 0443) Pulse Rate:  [82-99] 89 (04/21 0443) Resp:  [12-20] 20 (04/21 0443) BP: (127-152)/(56-78) 136/69 (04/21 0443) SpO2:  [90 %-100 %] 97 % (04/21 0443)  Intake/Output from previous day:  Intake/Output Summary (Last 24 hours) at 04/20/2019 0720 Last data filed at 04/20/2019 0960 Gross per 24 hour  Intake 3322.18 ml  Output 3280 ml  Net 42.18 ml    Intake/Output this shift: No intake/output data recorded.  Labs: Recent Labs    04/17/19 1048 04/17/19 2126 04/18/19 0257 04/19/19 0325 04/20/19 0428  HGB 12.7 13.2 12.4 11.6* 9.7*   Recent Labs    04/19/19 0325 04/20/19 0428  WBC 8.3 8.1  RBC 3.98 3.26*  HCT 36.7 29.3*  PLT 108* 109*   Recent Labs    04/19/19 0325 04/20/19 0428  NA 137 134*  K 4.4 4.4  CL 109 109  CO2 19* 19*  BUN 17 24*  CREATININE 0.64 0.66  GLUCOSE 162* 287*  CALCIUM 8.3* 8.0*   Recent Labs    04/17/19 1206 04/17/19 2126  INR 1.5* 1.3*    EXAM General - Patient is Alert and Appropriate Extremity - Neurovascular intact Dorsiflexion/Plantar flexion intact Incision: dressing C/D/I Compartment soft Dressing - no drainage Motor Function - intact, moving foot and toes well on exam.  Hemovac pulled without difficulty.  Past Medical History:  Diagnosis Date  . Anginal pain ([place])    "saw doctor-think it was esophagus spasm"  . Arthritis   . Atrial flutter ([place]) 11/02/2013  . Cataract    left eye  . Coronary artery disease   . Diabetes mellitus without complication ([place])    type 2  . Essential hypertension, benign 11/02/2013  . H/O jaundice    as child  . Headache 4540   complicated  migraine x2   . Hearing trouble   . Heart disease   . Hepatitis    hx of hepatitis A as child  . High cholesterol    under control  . Long term (current) use of anticoagulants 11/02/2013   Pradaxa   . Measles    as child  . Mouth dryness   . Mumps    as teenager  . Numbness and tingling    lips  . Osteoporosis    "unsure-maybe in knees"  . Psoriasis   . Psoriatic arthritis ([place])   . Stroke ([place]) 2012  . Vertigo    being monitored, occasional    Assessment/Plan: 1 Day Post-Op Procedure(s) (LRB): TOTAL HIP ARTHROPLASTY ANTERIOR APPROACH (Left) Principal Problem:   S/p left hip fracture Active Problems:   Coronary artery disease involving coronary bypass graft of native heart with angina pectoris (HCC)   Atrial flutter (HCC)   Essential hypertension, benign   Complicated migraine   Arthritis   DM (diabetes mellitus), type 2 (HCC)   Advance diet Up with therapy D/C IV fluids  DVT Prophylaxis - Pradaxa Weight Bearing As Tolerated left Leg Hemovac Pulled Begin Therapy  Pilar Plate Ceairra Mccarver

## 2019-04-20 NOTE — Progress Notes (Addendum)
PROGRESS NOTE    [name]  XBM:[id number] DOB: 1934/09/20 DOA: 04/17/2019 PCP: [name], MD   Brief Narrative:  HPI On 04/17/2019 by [name] [name] is a 85 y.o. female with medical history of A-flutter, cAD, DM2, HTN, HLD, CVA, vertigo, Complex migrains who presents after a fall and is found to have a left hip fracture.   On my eval, she is confused and unable to contribute to history. She is repeating "There is something not right, I am cold". She is not able to tell me her name or where she is.  Per history obtained from ED doctor, RN and her son, she fell this AM while walking in her basement and was brought in by EMS. She told the ED doctor that she did not hit her head. She was oriented when she first arrived in the ED but became confused while she was in the ED.  Per her son, Dr [name],  she has become confused from narcotics in the past. In addition she had an episode in 11/19 of change in speech and right frontal headache. CVA work up was negative and she was suspected to have complicated migraines. She was having 2-3 episodes a week which stopped after her Metfomin was discontinued.  Interim history Admitted for left hip fracture. Also had acute encephalopathy, which as resolved.  Assessment & Plan   Left hip fracture -s/p fall prior to admission -Left hip x-ray showed subcapital left femoral neck fracture -Orthopedic surgery consulted and appreciated, s/p left total hip arthoplasty, anterior approach -Patient currently on Pradaxa which has been held, with possible surgery planned for 04/19/2019 -Continue pain control  Acute metabolic encephalopathy -Resolved, patient currently alert and oriented x4 -CTA head x2, CTA head unremarkable -Neurology consulted and appreciated -UA unremarkable for infection -Chest x-ray unremarkable for infection -Possibly secondary to fall and hip pain versus possible medications vs ?migraine -EEG normal awake  electroencephalogram.  No focal lateralizing or elliptic form features. -Discussed with neurology, Dr. Wilford Corner. MRI to be done as an outpatient (open MRI). May want to start low dose Topamax at night 25mg .   Arthritis/chronic pain -Continue pain contorl   Diabetes mellitus, type II -Continue Lantus, insulin sliding scale and CBG monitoring  Coronary artery disease -History of CABG -Stable, no complaints of chest pain -Continue statin, Coreg -lisinopril held  Atrial flutter -Currently in sinus rhythm, continue coreg -Pradaxa restarted  Essential hypertension -Continue Coreg -Lisinopril held  Complicated migraines -stable  Insomnia  -Continue trazodone  Acute anemia secondary to blood loss -Likely from recent surgery, hemoglobin currently 9.7 -Continue to monitor CBC  DVT Prophylaxis  Pradaxa  Code Status: Full  Family Communication: None at bedside. Attempted to reach son.  Disposition Plan: Admitted. Dispo TBD. Pending PT/OT consults.   Consultants Orthopedic surgery Neurology  Procedures  Left total hip arthoplasty, anterior approach EEG  Antibiotics   Anti-infectives (From admission, onward)   Start     Dose/Rate Route Frequency Ordered Stop   04/19/19 1300  clindamycin (CLEOCIN) IVPB 600 mg     600 mg 100 mL/hr over 30 Minutes Intravenous Every 6 hours 04/19/19 1046 04/19/19 1242   04/19/19 0715  clindamycin (CLEOCIN) IVPB 600 mg     600 mg 100 mL/hr over 30 Minutes Intravenous  Once 04/19/19 0709 04/19/19 0731   04/19/19 0700  clindamycin (CLEOCIN) IVPB 900 mg  Status:  Discontinued     900 mg 100 mL/hr over 30 Minutes Intravenous  Once 04/19/19  1610 04/19/19 1046      Subjective:   [name] seen and examined today.  Patient feeling better this morning.  Has no pain at this time although has not been out of bed.  Denies current chest pain, shortness of breath, abdominal pain, nausea or vomiting, diarrhea or constipation, dizziness or headache.   Objective:   Vitals:   04/19/19 1404 04/19/19 2036 04/20/19 0025 04/20/19 0443  BP: 130/63 (!) 142/66 (!) 131/56 136/69  Pulse: 84 95 99 89  Resp: 18 16 20 20   Temp: 97.8 F (36.6 C) 98.9 F (37.2 C) 98.4 F (36.9 C) 98.9 F (37.2 C)  TempSrc: Oral Oral Oral Oral  SpO2: 100%  96% 97%  Weight:      Height:        Intake/Output Summary (Last 24 hours) at 04/20/2019 9604 Last data filed at 04/20/2019 5409 Gross per 24 hour  Intake 2022.18 ml  Output 2330 ml  Net -307.82 ml   Filed Weights   04/18/19 0453  Weight: 81.9 kg   Exam  General: Well developed, well nourished, NAD, appears stated age  HEENT: NCAT,mucous membranes moist.   Neck: Supple  Cardiovascular: S1 S2 auscultated, RRR  Respiratory: Clear to auscultation bilaterally with equal chest rise  Abdomen: Soft, nontender, nondistended, + bowel sounds  Extremities: warm dry without cyanosis clubbing or edema  Neuro: AAOx3, nonfocal  Psych: Appropriate mood and affect  Data Reviewed: I have personally reviewed following labs and imaging studies  CBC: Recent Labs  Lab 04/17/19 1048 04/17/19 2126 04/18/19 0257 04/19/19 0325 04/20/19 0428  WBC 10.0 10.1 9.4 8.3 8.1  NEUTROABS 7.8*  --   --   --   --   HGB 12.7 13.2 12.4 11.6* 9.7*  HCT 40.0 39.8 38.0 36.7 29.3*  MCV 88.9 88.6 89.0 92.2 89.9  PLT 156 154 151 108* 109*   Basic Metabolic Panel: Recent Labs  Lab 04/17/19 1048 04/17/19 2126 04/18/19 0257 04/19/19 0325 04/20/19 0428  NA 137 135 137 137 134*  K 4.2 4.1 4.3 4.4 4.4  CL 107 104 106 109 109  CO2 24 23 24  19* 19*  GLUCOSE 179* 149* 116* 162* 287*  BUN 16 17 17 17  24*  CREATININE 0.64 0.74 0.79 0.64 0.66  CALCIUM 8.6* 8.9 8.7* 8.3* 8.0*   GFR: Estimated Creatinine Clearance: 52.8 mL/min (by C-G formula based on SCr of 0.66 mg/dL). Liver Function Tests: No results for input(s): AST, ALT, ALKPHOS, BILITOT, PROT, ALBUMIN in the last 168 hours. No results for input(s): LIPASE,  AMYLASE in the last 168 hours. No results for input(s): AMMONIA in the last 168 hours. Coagulation Profile: Recent Labs  Lab 04/17/19 1206 04/17/19 2126  INR 1.5* 1.3*   Cardiac Enzymes: Recent Labs  Lab 04/17/19 1155 04/17/19 2126 04/18/19 0257 04/18/19 0843  TROPONINI <0.03 <0.03 <0.03 <0.03   BNP (last 3 results) No results for input(s): PROBNP in the last 8760 hours. HbA1C: No results for input(s): HGBA1C in the last 72 hours. CBG: Recent Labs  Lab 04/19/19 1008 04/19/19 1219 04/19/19 1608 04/19/19 2158 04/20/19 0726  GLUCAP 154* 168* 232* 321* 245*   Lipid Profile: No results for input(s): CHOL, HDL, LDLCALC, TRIG, CHOLHDL, LDLDIRECT in the last 72 hours. Thyroid Function Tests: No results for input(s): TSH, T4TOTAL, FREET4, T3FREE, THYROIDAB in the last 72 hours. Anemia Panel: No results for input(s): VITAMINB12, FOLATE, FERRITIN, TIBC, IRON, RETICCTPCT in the last 72 hours. Urine analysis:    Component Value Date/Time  COLORURINE YELLOW 04/18/2019 0520   APPEARANCEUR CLEAR 04/18/2019 0520   LABSPEC 1.020 04/18/2019 0520   PHURINE 5.0 04/18/2019 0520   GLUCOSEU NEGATIVE 04/18/2019 0520   HGBUR TRACE (A) 04/18/2019 0520   BILIRUBINUR NEGATIVE 04/18/2019 0520   KETONESUR TRACE (A) 04/18/2019 0520   PROTEINUR NEGATIVE 04/18/2019 0520   UROBILINOGEN 0.2 09/05/2015 1531   NITRITE NEGATIVE 04/18/2019 0520   LEUKOCYTESUR NEGATIVE 04/18/2019 0520   Sepsis Labs: @LABRCNTIP (procalcitonin:4,lacticidven:4)  ) Recent Results (from the past 240 hour(s))  MRSA PCR Screening     Status: None   Collection Time: 04/17/19 11:45 PM  Result Value Ref Range Status   MRSA by PCR NEGATIVE NEGATIVE Final    Comment:        The GeneXpert MRSA Assay (FDA approved for NASAL specimens only), is one component of a comprehensive MRSA colonization surveillance program. It is not intended to diagnose MRSA infection nor to guide or monitor treatment for MRSA infections.  Performed at [facility], [address] [place], [place] [address]       Radiology Studies: Dg Pelvis Portable  Result Date: 04/19/2019 CLINICAL DATA:  Status post left hip replacement today. EXAM: PORTABLE PELVIS 1-2 VIEWS COMPARISON:  Intraoperative images earlier today. FINDINGS: Left hip arthroplasty is in place. Surgical drain is noted. No fracture or dislocation. Gas in the soft tissues from surgery noted. IMPRESSION: Status post left hip replacement.  No acute finding. Electronically Signed   By: Drusilla Kanner M.D.   On: 04/19/2019 10:10   Dg C-arm 1-60 Min-no Report  Result Date: 04/19/2019 Fluoroscopy was utilized by the requesting physician.  No radiographic interpretation.   Dg Hip Operative Unilat With Pelvis Left  Result Date: 04/19/2019 CLINICAL DATA:  Repair of left femoral neck fracture EXAM: OPERATIVE LEFT HIP (WITH PELVIS IF PERFORMED) 2 VIEWS TECHNIQUE: Fluoroscopic spot image(s) were submitted for interpretation post-operatively. COMPARISON:  04/17/2027 left hip radiographs FINDINGS: Fluoroscopy time 0 minutes 7 seconds. Two spot fluoroscopic intraoperative radiographs demonstrate postsurgical changes from left total hip arthroplasty with no evidence of left hip malalignment on these views. IMPRESSION: Intraoperative fluoroscopic guidance for left total hip arthroplasty. Electronically Signed   By: Delbert Phenix M.D.   On: 04/19/2019 08:53     Scheduled Meds: . carvedilol  12.5 mg Oral BID WC  . dabigatran  150 mg Oral BID  . dexamethasone  10 mg Intravenous Once  . docusate sodium  100 mg Oral BID  . insulin aspart  0-5 Units Subcutaneous QHS  . insulin glargine  20 Units Subcutaneous QHS  . loratadine  10 mg Oral Daily  . rosuvastatin  10 mg Oral QHS  . sodium chloride flush  3 mL Intravenous Q12H  . traZODone  25 mg Oral QHS  . vitamin B-12  250 mcg Oral Daily   Continuous Infusions: . methocarbamol (ROBAXIN) IV    .  sodium  bicarbonate infusion 1/4 NS 1000 mL 75 mL/hr at 04/20/19 0758     LOS: 3 days   Time Spent in minutes   30 minutes  Jocsan Mcginley D.O. on 04/20/2019 at 9:38 AM  Between 7am to 7pm - Please see pager noted on [email or web address]  After 7pm go to [email or web address]  And look for the night coverage person covering for me after hours  [facility] Office  [phone]

## 2019-04-20 NOTE — Procedures (Signed)
ELECTROENCEPHALOGRAM REPORT   Patient: [name]       Room #: 7106 EEG No. ID: [id number] Age: 85 y.o.        Sex: female Referring Physician: Ree Kida Report Date:  04/20/2019        Interpreting Physician: Alexis Goodell  History: ANABELEN KAMINSKY is an 85 y.o. female with a fall and development of confusion  Medications:  Coreg, Pradaxa, Decadron, Colace, Insulin, Claritin, Crestor, Desyrel, B12  Conditions of Recording:  This is a 21 channel routine scalp EEG performed with bipolar and monopolar montages arranged in accordance to the international 10/20 system of electrode placement. One channel was dedicated to EKG recording.  The patient is in the awake state.  Description:  The waking background activity consists of a low voltage, symmetrical, fairly well organized, 9 Hz alpha activity, seen from the parieto-occipital and posterior temporal regions.  Low voltage fast activity, poorly organized, is seen anteriorly and is at times superimposed on more posterior regions.  A mixture of theta and alpha rhythms are seen from the central and temporal regions. The patient does not drowse or sleep. No epileptiform activity is noted.   Hyperventilation and intermittent photic stimulation were not performed.  IMPRESSION: This is a normal awake electroencephalogram. There are no focal lateralizing or epileptiform features.   Alexis Goodell, MD Neurology [phone] 04/20/2019, 9:34 AM

## 2019-04-21 DIAGNOSIS — I483 Typical atrial flutter: Secondary | ICD-10-CM

## 2019-04-21 LAB — BASIC METABOLIC PANEL
Anion gap: 8 (ref 5–15)
BUN: 26 mg/dL — ABNORMAL HIGH (ref 8–23)
CO2: 22 mmol/L (ref 22–32)
Calcium: 8.2 mg/dL — ABNORMAL LOW (ref 8.9–10.3)
Chloride: 106 mmol/L (ref 98–111)
Creatinine, Ser: 0.61 mg/dL (ref 0.44–1.00)
GFR calc Af Amer: >60 mL/min (ref >60)
GFR calc non Af Amer: >60 mL/min (ref >60)
Glucose, Bld: 271 mg/dL — ABNORMAL HIGH (ref 70–99)
Potassium: 4.2 mmol/L (ref 3.5–5.1)
Sodium: 136 mmol/L (ref 135–145)

## 2019-04-21 LAB — CBC
HCT: 26.6 % — ABNORMAL LOW (ref 36.0–46.0)
Hemoglobin: 8.8 g/dL — ABNORMAL LOW (ref 12.0–15.0)
MCH: 29.2 pg (ref 26.0–34.0)
MCHC: 33.1 g/dL (ref 30.0–36.0)
MCV: 88.4 fL (ref 80.0–100.0)
Platelets: 127 10*3/uL — ABNORMAL LOW (ref 150–400)
RBC: 3.01 MIL/uL — ABNORMAL LOW (ref 3.87–5.11)
RDW: 12.6 % (ref 11.5–15.5)
WBC: 7.8 10*3/uL (ref 4.0–10.5)
nRBC: 0 % (ref 0.0–0.2)

## 2019-04-21 LAB — GLUCOSE, CAPILLARY
Glucose-Capillary: 273 mg/dL — ABNORMAL HIGH (ref 70–99)
Glucose-Capillary: 314 mg/dL — ABNORMAL HIGH (ref 70–99)
Glucose-Capillary: 265 mg/dL — ABNORMAL HIGH (ref 70–99)
Glucose-Capillary: 269 mg/dL — ABNORMAL HIGH (ref 70–99)

## 2019-04-21 MED ORDER — INSULIN ASPART 100 UNIT/ML ~~LOC~~ SOLN
4.0000 [IU] | Freq: Three times a day (TID) | SUBCUTANEOUS | Status: DC
  Administered 2019-04-21 – 2019-04-22 (×4): 4 [IU] via SUBCUTANEOUS

## 2019-04-21 NOTE — Progress Notes (Signed)
[facility]                                                                              Patient Demographics  [name], is a 85 y.o. female, DOB - 03-20-1934, ZOX:[id number]  Admit date - 04/17/2019   Admitting Physician Calvert Cantor, MD  Outpatient Primary MD for the patient is [name], MD  Outpatient specialists:   LOS - 4  days   Medical records reviewed and are as summarized below:    Chief Complaint  Patient presents with  . Fall  . Hip Pain    left       Brief summary   85 year old female with history of atrial flutter, CAD, diabetes mellitus type 2, hypertension, hyperlipidemia, CVA, complex migraines, presented after a fall and left hip fracture. She was oriented when she first arrived in the ED but became confused while she was in the ED.  Per her son, Dr [name], she has become confused from narcotics in the past. In addition she had an episode in 11/19 of change in speech and right frontal headache. CVA work up was negative and she was suspected to have complicated migraines. She was having 2-3 episodes a week which stopped after her Metfomin was discontinued.   Assessment & Plan    Principal Problem: Left hip fracture -Secondary to mechanical fall -Left hip x-ray showed subcapital left femoral neck fracture -Orthopedics was consulted, underwent left total hip arthroplasty, anterior approach on 04/19/2019, postop day #2 -Continue PT evaluation, patient hoping to go home soon -Continue Pradaxa  Active Problems: Acute metabolic encephalopathy -Currently resolved, alert and oriented x4.  Telemetry unremarkable. -CT head showed no acute abnormalities, CT angiogram of the head negative -Neurology was consulted, underwent EEG which was normal, no focal lateralizing or epileptic form seizures -Outpatient follow-up with neurology patient MRI if she continues to have these episodes.    Coronary artery disease involving coronary  bypass graft of native heart with angina pectoris (HCC) -No acute issues, history of CABG -Continue statin, Coreg     Atrial flutter (HCC) -Currently normal sinus rhythm, continue Coreg -Continue Pradaxa     Essential hypertension, benign -Continue Coreg    Complicated migraine -Currently stable    Arthritis -Continue pain control, avoid narcotics    DM (diabetes mellitus), type 2 (HCC) -CBGs uncontrolled likely due to Decadron -Continue Lantus, sliding scale insulin moderate, added NovoLog meal coverage 4 units 3 times daily AC  Acute anemia secondary to blood loss postop  -Hemoglobin 8.8, follow closely  Code Status: Full code DVT Prophylaxis: Pradaxa Family Communication: Discussed in detail with the patient, all imaging results, lab results explained to the patient and son, Dr. Myrle Sheng   Disposition Plan: Likely DC home in a.m. with home PT  Time Spent in minutes 35 minutes  Procedures:  EEG Left hip arthroplasty anterior approach  Consultants:   Orthopedics Neurology  Antimicrobials:   Anti-infectives (From admission, onward)   Start     Dose/Rate Route Frequency Ordered Stop   04/19/19 1300  clindamycin (CLEOCIN) IVPB 600 mg     600 mg 100 mL/hr over 30 Minutes  Intravenous Every 6 hours 04/19/19 1046 04/19/19 1242   04/19/19 0715  clindamycin (CLEOCIN) IVPB 600 mg     600 mg 100 mL/hr over 30 Minutes Intravenous  Once 04/19/19 0709 04/19/19 0731   04/19/19 0700  clindamycin (CLEOCIN) IVPB 900 mg  Status:  Discontinued     900 mg 100 mL/hr over 30 Minutes Intravenous  Once 04/19/19 0653 04/19/19 1046          Medications  Scheduled Meds: . carvedilol  12.5 mg Oral BID WC  . dabigatran  150 mg Oral BID  . docusate sodium  100 mg Oral BID  . insulin aspart  0-15 Units Subcutaneous TID WC  . insulin aspart  0-5 Units Subcutaneous QHS  . insulin aspart  4 Units Subcutaneous TID WC  . insulin glargine  20 Units Subcutaneous QHS  . loratadine   10 mg Oral Daily  . rosuvastatin  10 mg Oral QHS  . sodium chloride flush  3 mL Intravenous Q12H  . traZODone  25 mg Oral QHS  . vitamin B-12  250 mcg Oral Daily   Continuous Infusions: . methocarbamol (ROBAXIN) IV     PRN Meds:.acetaminophen **OR** acetaminophen, bisacodyl, clonazePAM, cyclobenzaprine, diphenhydrAMINE, hydrALAZINE, HYDROcodone-acetaminophen, menthol-cetylpyridinium **OR** phenol, methocarbamol **OR** methocarbamol (ROBAXIN) IV, metoCLOPramide **OR** metoCLOPramide (REGLAN) injection, nitroGLYCERIN, ondansetron **OR** ondansetron (ZOFRAN) IV, polyethylene glycol, senna, sodium chloride flush, sodium phosphate, traMADol      Subjective:   [name] was seen and examined today.  Did not sleep well last night otherwise no acute complaints.  Pain controlled. Patient denies dizziness, chest pain, shortness of breath, abdominal pain, N/V/D/C, new weakness, numbess, tingling. No acute events overnight.    Objective:   Vitals:   04/20/19 1129 04/20/19 1311 04/20/19 2121 04/21/19 0522  BP: (!) 148/64 (!) 128/57 (!) 142/66 (!) 141/57  Pulse: 80 87 81 71  Resp: 16 14 20 18   Temp: 98.5 F (36.9 C) 98.7 F (37.1 C) 98.6 F (37 C) 97.8 F (36.6 C)  TempSrc: Oral Oral Oral Oral  SpO2: 98% 100% 94% 93%  Weight:      Height:        Intake/Output Summary (Last 24 hours) at 04/21/2019 1233 Last data filed at 04/21/2019 1200 Gross per 24 hour  Intake 2339.4 ml  Output 3200 ml  Net -860.6 ml     Wt Readings from Last 3 Encounters:  04/18/19 81.9 kg  01/25/19 78.9 kg  12/08/18 78.9 kg     Exam  General: Alert and oriented x 3, NAD, pleasant and cooperative   eyes:   HEENT:  Atraumatic, normocephalic, normal oropharynx  Cardiovascular: S1 S2 auscultated, Regular rate and rhythm.  Respiratory: Clear to auscultation bilaterally, no wheezing, rales or rhonchi  Gastrointestinal: Soft, nontender, nondistended, + bowel sounds  Ext: no pedal edema bilaterally   Neuro: No new deficits  Musculoskeletal: No digital cyanosis, clubbing  Skin: No rashes  Psych: Normal affect and demeanor, alert and oriented x3    Data Reviewed:  I have personally reviewed following labs and imaging studies  Micro Results Recent Results (from the past 240 hour(s))  MRSA PCR Screening     Status: None   Collection Time: 04/17/19 11:45 PM  Result Value Ref Range Status   MRSA by PCR NEGATIVE NEGATIVE Final    Comment:        The GeneXpert MRSA Assay (FDA approved for NASAL specimens only), is one component of a comprehensive MRSA colonization surveillance program. It is not intended  to diagnose MRSA infection nor to guide or monitor treatment for MRSA infections. Performed at [facility], [address], [place], [place] [address]     Radiology Reports Ct Angio Head W Or Wo Contrast  Result Date: 04/17/2019 CLINICAL DATA:  85 year old female with worsening mental status status post fall with left femoral neck fracture. Increasing confusion. EXAM: CT ANGIOGRAPHY HEAD TECHNIQUE: Multidetector CT imaging of the head was performed using the standard protocol during bolus administration of intravenous contrast. Multiplanar CT image reconstructions and MIPs were obtained to evaluate the vascular anatomy. CONTRAST:  OMNIPAQUE IOHEXOL 350 MG/ML SOLN COMPARISON:  Noncontrast head CTs today 2003 hours and earlier. FINDINGS: Posterior circulation: Codominant distal vertebral arteries are patent to the vertebrobasilar junction without stenosis. Normal PICA origins. Patent basilar artery without stenosis. Patent SCA origins. Normal right PCA origin. Fetal type left PCA origin. Right posterior communicating artery is diminutive or absent. Bilateral PCA branches are within normal limits. Anterior circulation: Tortuous distal cervical ICAs, more so on the right. Patent bilateral ICA siphons. On the left there is calcified atherosclerosis but only mild  left siphon stenosis. Normal left ophthalmic and posterior communicating artery origins. On the right there is similar calcified atherosclerosis with mild right siphon stenosis. Patent carotid termini, MCA and ACA origins. Diminutive or absent anterior communicating artery. Bilateral ACA branches are within normal limits. Left MCA M1 segment and trifurcation are patent without stenosis. Left MCA branches are within normal limits. Right MCA M1 segment and trifurcation are patent without stenosis. Right MCA branches are within normal limits. Venous sinuses: Major venous structures appear to be patent on the delayed images. Anatomic variants: Fetal type left PCA origin. Delayed phase: No abnormal enhancement identified. Gray-white matter differentiation appears stable since 1249 hours earlier today, including chronic small left SCA territory infarct. IMPRESSION: 1. Negative for age intracranial CTA. No intracranial hemodynamically significant stenosis or arterial occlusion identified. 2. Stable CT appearance of the brain since 1249 hours today. Electronically Signed   By: Odessa Fleming M.D.   On: 04/17/2019 20:57   Ct Head Wo Contrast  Result Date: 04/17/2019 CLINICAL DATA:  New onset of confusion.  Blood thinners. EXAM: CT HEAD WITHOUT CONTRAST TECHNIQUE: Contiguous axial images were obtained from the base of the skull through the vertex without intravenous contrast. COMPARISON:  April 17, 2019 FINDINGS: Brain: No subdural, epidural, or subarachnoid hemorrhage. Stable left superior cerebellar infarct. Cerebellum, brainstem, and basal cisterns are otherwise normal. Scattered white matter changes are noted. Ventricles and sulci are unremarkable. No mass effect or midline shift. No acute cortical ischemia or infarct. Vascular: No hyperdense vessel or unexpected calcification. Skull: Normal. Negative for fracture or focal lesion. Sinuses/Orbits: No acute finding. Other: None. IMPRESSION: 1. No acute intracranial  abnormalities. Scattered white matter changes. Chronic left cerebellar infarct. No intracranial hemorrhage noted. Electronically Signed   By: Gerome Sam III M.D   On: 04/17/2019 13:18   Ct Head Wo Contrast  Result Date: 04/17/2019 CLINICAL DATA:  Unwitnessed fall. EXAM: CT HEAD WITHOUT CONTRAST TECHNIQUE: Contiguous axial images were obtained from the base of the skull through the vertex without intravenous contrast. COMPARISON:  Head CT and MRI 11/15/2018 FINDINGS: Brain: There is no evidence of acute infarct, intracranial hemorrhage, mass, midline shift, or extra-axial fluid collection. Mild cerebral atrophy is within normal limits for age. A chronic superior left cerebellar infarct is unchanged. Patchy cerebral white matter hypodensities are unchanged and nonspecific but compatible with moderate chronic small vessel ischemic disease. Vascular:  Calcified atherosclerosis at the skull base. No hyperdense vessel. Skull: No fracture or focal osseous lesion. Sinuses/Orbits: Visualized paranasal sinuses and mastoid air cells are clear. Bilateral cataract extraction is noted. Other: None. IMPRESSION: 1. No evidence of acute intracranial abnormality. 2. Moderate chronic small vessel ischemic disease. Electronically Signed   By: Sebastian Ache M.D.   On: 04/17/2019 11:36   Dg Pelvis Portable  Result Date: 04/19/2019 CLINICAL DATA:  Status post left hip replacement today. EXAM: PORTABLE PELVIS 1-2 VIEWS COMPARISON:  Intraoperative images earlier today. FINDINGS: Left hip arthroplasty is in place. Surgical drain is noted. No fracture or dislocation. Gas in the soft tissues from surgery noted. IMPRESSION: Status post left hip replacement.  No acute finding. Electronically Signed   By: Drusilla Kanner M.D.   On: 04/19/2019 10:10   Dg Chest Port 1 View  Result Date: 04/17/2019 CLINICAL DATA:  Unwitnessed fall. EXAM: PORTABLE CHEST 1 VIEW COMPARISON:  March 24, 2006 FINDINGS: The heart size and mediastinal  contours are within normal limits. Both lungs are clear. The visualized skeletal structures are unremarkable. IMPRESSION: No active disease. Electronically Signed   By: Gerome Sam III M.D   On: 04/17/2019 11:32   Dg C-arm 1-60 Min-no Report  Result Date: 04/19/2019 Fluoroscopy was utilized by the requesting physician.  No radiographic interpretation.   Dg Hip Operative Unilat With Pelvis Left  Result Date: 04/19/2019 CLINICAL DATA:  Repair of left femoral neck fracture EXAM: OPERATIVE LEFT HIP (WITH PELVIS IF PERFORMED) 2 VIEWS TECHNIQUE: Fluoroscopic spot image(s) were submitted for interpretation post-operatively. COMPARISON:  04/17/2027 left hip radiographs FINDINGS: Fluoroscopy time 0 minutes 7 seconds. Two spot fluoroscopic intraoperative radiographs demonstrate postsurgical changes from left total hip arthroplasty with no evidence of left hip malalignment on these views. IMPRESSION: Intraoperative fluoroscopic guidance for left total hip arthroplasty. Electronically Signed   By: Delbert Phenix M.D.   On: 04/19/2019 08:53   Dg Hip Unilat W Or Wo Pelvis 2-3 Views Left  Result Date: 04/17/2019 CLINICAL DATA:  LEFT hip pain EXAM: DG HIP (WITH OR WITHOUT PELVIS) 2-3V LEFT COMPARISON:  None. FINDINGS: There is shortening of the LEFT femoral neck. There is linear superimposition of bone density along the inferior margin. These findings are consistent with LEFT femoral neck fracture. Fracture appears to be subcapital in location. No significant angulation. There is bilateral joint space narrowing of the hip joints. Sclerosis of the acetabular roofs. IMPRESSION: 1. Subcapital LEFT femoral neck fracture. 2. Bilateral osteoarthritis of the hip joints. Electronically Signed   By: Genevive Bi M.D.   On: 04/17/2019 11:34   Ct Head Code Stroke Wo Contrast  Addendum Date: 04/17/2019   ADDENDUM REPORT: 04/17/2019 20:36 ADDENDUM: Study discussed by telephone with Dr. Stevie Kern on 04/17/2019 at  2032 hours. Electronically Signed   By: Odessa Fleming M.D.   On: 04/17/2019 20:36   Result Date: 04/17/2019 CLINICAL DATA:  Code stroke. 85 year old female status post fall earlier today, confusion. EXAM: CT HEAD WITHOUT CONTRAST TECHNIQUE: Contiguous axial images were obtained from the base of the skull through the vertex without intravenous contrast. COMPARISON:  Head CT 1249 hours today and earlier. FINDINGS: This exam is moderately degraded by motion artifact despite repeated imaging attempts. Brain: No intracranial mass effect or ventriculomegaly. No acute intracranial hemorrhage identified. Left superior cerebellar infarct appears stable since November 2019. Supratentorial gray-white matter differentiation is stable within the limitations of this exam. Vascular: Calcified atherosclerosis at the skull base. Skull: No acute osseous abnormality identified. Sinuses/Orbits:  Stable and generally well pneumatized. Other: No acute orbit or scalp soft tissue finding. ASPECTS [facility] Stroke Program Early CT Score) - suboptimal evaluation due to motion, estimated ASPECTS 10. IMPRESSION: 1. Moderately motion degraded exam despite repeated imaging attempts. 2. Grossly stable from head CTs earlier today, no acute intracranial abnormality identified. Electronically Signed: By: Odessa Fleming M.D. On: 04/17/2019 20:24    Lab Data:  CBC: Recent Labs  Lab 04/17/19 1048 04/17/19 2126 04/18/19 0257 04/19/19 0325 04/20/19 0428 04/21/19 0438  WBC 10.0 10.1 9.4 8.3 8.1 7.8  NEUTROABS 7.8*  --   --   --   --   --   HGB 12.7 13.2 12.4 11.6* 9.7* 8.8*  HCT 40.0 39.8 38.0 36.7 29.3* 26.6*  MCV 88.9 88.6 89.0 92.2 89.9 88.4  PLT 156 154 151 108* 109* 127*   Basic Metabolic Panel: Recent Labs  Lab 04/17/19 2126 04/18/19 0257 04/19/19 0325 04/20/19 0428 04/21/19 0438  NA 135 137 137 134* 136  K 4.1 4.3 4.4 4.4 4.2  CL 104 106 109 109 106  CO2 23 24 19* 19* 22  GLUCOSE 149* 116* 162* 287* 271*  BUN 17 17 17  24* 26*   CREATININE 0.74 0.79 0.64 0.66 0.61  CALCIUM 8.9 8.7* 8.3* 8.0* 8.2*   GFR: Estimated Creatinine Clearance: 52.8 mL/min (by C-G formula based on SCr of 0.61 mg/dL). Liver Function Tests: No results for input(s): AST, ALT, ALKPHOS, BILITOT, PROT, ALBUMIN in the last 168 hours. No results for input(s): LIPASE, AMYLASE in the last 168 hours. No results for input(s): AMMONIA in the last 168 hours. Coagulation Profile: Recent Labs  Lab 04/17/19 1206 04/17/19 2126  INR 1.5* 1.3*   Cardiac Enzymes: Recent Labs  Lab 04/17/19 1155 04/17/19 2126 04/18/19 0257 04/18/19 0843  TROPONINI <0.03 <0.03 <0.03 <0.03   BNP (last 3 results) No results for input(s): PROBNP in the last 8760 hours. HbA1C: No results for input(s): HGBA1C in the last 72 hours. CBG: Recent Labs  Lab 04/20/19 1219 04/20/19 1645 04/20/19 2120 04/21/19 0723 04/21/19 1153  GLUCAP 280* 402* 370* 273* 314*   Lipid Profile: No results for input(s): CHOL, HDL, LDLCALC, TRIG, CHOLHDL, LDLDIRECT in the last 72 hours. Thyroid Function Tests: No results for input(s): TSH, T4TOTAL, FREET4, T3FREE, THYROIDAB in the last 72 hours. Anemia Panel: No results for input(s): VITAMINB12, FOLATE, FERRITIN, TIBC, IRON, RETICCTPCT in the last 72 hours. Urine analysis:    Component Value Date/Time   COLORURINE YELLOW 04/18/2019 0520   APPEARANCEUR CLEAR 04/18/2019 0520   LABSPEC 1.020 04/18/2019 0520   PHURINE 5.0 04/18/2019 0520   GLUCOSEU NEGATIVE 04/18/2019 0520   HGBUR TRACE (A) 04/18/2019 0520   BILIRUBINUR NEGATIVE 04/18/2019 0520   KETONESUR TRACE (A) 04/18/2019 0520   PROTEINUR NEGATIVE 04/18/2019 0520   UROBILINOGEN 0.2 09/05/2015 1531   NITRITE NEGATIVE 04/18/2019 0520   LEUKOCYTESUR NEGATIVE 04/18/2019 0520     [name] M.D. [facility] 04/21/2019, 12:33 PM  Pager: [phone] Between 7am to 7pm - call Pager - [phone]  After 7pm go to [email or web address] - password [email or web address]  Call night coverage person  covering after 7pm

## 2019-04-21 NOTE — Evaluation (Signed)
Occupational Therapy Evaluation Patient Details Name: [name] MRN: [id number] DOB: 12-11-1934 Today's Date: 04/21/2019    History of Present Illness 85 yo female admitted to ED on 4/18 s/p fall at home. S/p L DA-THA on 4/20. CT head negative for acute findings, ruled out acute CVA more in favor of complex migraine resulting in fall and confusion afterwards. PMH includes angina, OA, aflutter, CAD with CABG 2006, HTN, DM, headache, OP, CVA 2012, psoriasis with psoriatic arthritis, vertigo, lumbar disc surgery, L TKR.   Clinical Impression   Pt was admitted for the above. At baseline, she is independent with adls.  Will follow in acute setting for bathroom transfers with min guard to min A level goals.  Family will assist with adls at home.    Follow Up Recommendations  Supervision/Assistance - 24 hour    Equipment Recommendations  3 in 1 bedside commode(delivered to room)    Recommendations for Other Services       Precautions / Restrictions Precautions Precautions: Fall Restrictions Weight Bearing Restrictions: No LLE Weight Bearing: Weight bearing as tolerated      Mobility Bed Mobility               General bed mobility comments: OOB in recliner   Transfers Overall transfer level: Needs assistance Equipment used: Rolling walker (2 wheeled) Transfers: Sit to/from [employer] Transfers Sit to Stand: Min assist;From elevated surface Stand pivot transfers: Min assist       General transfer comment:cues for hand placement.      Balance                                           ADL either performed or assessed with clinical judgement   ADL Overall ADL's : Needs assistance/impaired     Grooming: Set up;Wash/dry hands;Wash/dry face;Brushing hair   Upper Body Bathing: Set up   Lower Body Bathing: Moderate assistance;Sit to/from stand   Upper Body Dressing : Minimal assistance   Lower Body Dressing: Maximal assistance;Sit  to/from stand   Toilet Transfer: Minimal assistance;Stand-pivot;BSC;RW   Toileting- Clothing Manipulation and Hygiene: Moderate assistance;Sit to/from stand         General ADL Comments: performed bathing and toilet transfer (SPT).  Pt states daughter in law will assist when she goes to sons.  Reviewed stepping backwards into shower stall.  They will bring her shower seat to their house. Also reviewed working within pain tolerance during adls     Vision         Perception     Praxis      Pertinent Vitals/Pain Pain Assessment: 0-10 Pain Score: 5  Faces Pain Scale: Hurts little more Pain Location: L hip, with standing Pain Descriptors / Indicators: Discomfort;Tender;Sore Pain Intervention(s): Limited activity within patient's tolerance;Monitored during session;Repositioned;Ice applied     Hand Dominance     Extremity/Trunk Assessment Upper Extremity Assessment Upper Extremity Assessment: Generalized weakness           Communication Communication Communication: HOH   Cognition Arousal/Alertness: Awake/alert Behavior During Therapy: WFL for tasks assessed/performed Overall Cognitive Status: Within Functional Limits for tasks assessed                                 General Comments: improved cognition with good memory of past event of falling and current  medical conditiion   General Comments       Exercises     Shoulder Instructions      Home Living Family/patient expects to be discharged to:: Private residence Living Arrangements: Spouse/significant other Available Help at Discharge: Family;Available 24 hours/day               Bathroom Shower/Tub: Producer, television/film/video: Standard     Home Equipment: Environmental consultant - 4 wheels   Additional Comments: Pt typically lives with husband, who has 24/7 care.       Prior Functioning/Environment Level of Independence: Independent                 OT Problem List: Decreased  strength;Decreased activity tolerance;Decreased knowledge of use of DME or AE;Pain      OT Treatment/Interventions: Self-care/ADL training;DME and/or AE instruction;Therapeutic activities;Patient/family education    OT Goals(Current goals can be found in the care plan section) Acute Rehab OT Goals Patient Stated Goal: go home with family OT Goal Formulation: With patient Time For Goal Achievement: 04/28/19 Potential to Achieve Goals: Good ADL Goals Pt Will Perform Grooming: with supervision;standing Pt Will Transfer to Toilet: with min guard assist;bedside commode;ambulating Pt Will Perform Toileting - Clothing Manipulation and hygiene: with min assist;sit to/from stand Pt Will Perform Tub/Shower Transfer: Shower transfer;with min guard assist;rolling walker;shower seat;ambulating  OT Frequency: Min 2X/week   Barriers to D/C:            Co-evaluation              AM-PAC OT "6 Clicks" Daily Activity     Outcome Measure Help from another person eating meals?: None Help from another person taking care of personal grooming?: A Little Help from another person toileting, which includes using toliet, bedpan, or urinal?: A Lot Help from another person bathing (including washing, rinsing, drying)?: A Lot Help from another person to put on and taking off regular upper body clothing?: A Little Help from another person to put on and taking off regular lower body clothing?: A Lot 6 Click Score: 16   End of Session    Activity Tolerance: Patient tolerated treatment well Patient left: in chair;with call bell/phone within reach;with chair alarm set  OT Visit Diagnosis: Pain Pain - Right/Left: Left Pain - part of body: Hip                Time: 1200-1229 OT Time Calculation (min): 29 min Charges:  OT General Charges $OT Visit: 1 Visit OT Evaluation $OT Eval Low Complexity: 1 Low OT Treatments $Self Care/Home Management : 8-22 mins  Marica Otter, OTR/L Acute Rehabilitation  Services [phone] [facility] pager [phone] office 04/21/2019  Jacelynn Hayton 04/21/2019, 1:59 PM

## 2019-04-21 NOTE — Progress Notes (Signed)
   Subjective: 2 Days Post-Op Procedure(s) (LRB): TOTAL HIP ARTHROPLASTY ANTERIOR APPROACH (Left) Patient reports pain as mild.   Plan is to go Home after hospital stay.  Objective: Vital signs in last 24 hours: Temp:  [97.8 F (36.6 C)-98.7 F (37.1 C)] 97.8 F (36.6 C) (04/22 0522) Pulse Rate:  [71-87] 71 (04/22 0522) Resp:  [14-20] 18 (04/22 0522) BP: (128-148)/(57-66) 141/57 (04/22 0522) SpO2:  [93 %-100 %] 93 % (04/22 0522)  Intake/Output from previous day:  Intake/Output Summary (Last 24 hours) at 04/21/2019 0629 Last data filed at 04/21/2019 0522 Gross per 24 hour  Intake 1662.55 ml  Output 3700 ml  Net -2037.45 ml    Intake/Output this shift: Total I/O In: 552.5 [P.O.:240; I.V.:312.5] Out: 1700 [Urine:1700]  Labs: Recent Labs    04/19/19 0325 04/20/19 0428 04/21/19 0438  HGB 11.6* 9.7* 8.8*   Recent Labs    04/20/19 0428 04/21/19 0438  WBC 8.1 7.8  RBC 3.26* 3.01*  HCT 29.3* 26.6*  PLT 109* 127*   Recent Labs    04/20/19 0428 04/21/19 0438  NA 134* 136  K 4.4 4.2  CL 109 106  CO2 19* 22  BUN 24* 26*  CREATININE 0.66 0.61  GLUCOSE 287* 271*  CALCIUM 8.0* 8.2*   No results for input(s): LABPT, INR in the last 72 hours.  EXAM General - Patient is Alert and Appropriate Extremity - Neurovascular intact No cellulitis present Compartment soft Dressing/Incision - clean, dry, no drainage Motor Function - intact, moving foot and toes well on exam.   Past Medical History:  Diagnosis Date  . Anginal pain ([place])    "saw doctor-think it was esophagus spasm"  . Arthritis   . Atrial flutter ([place]) 11/02/2013  . Cataract    left eye  . Coronary artery disease   . Diabetes mellitus without complication ([place])    type 2  . Essential hypertension, benign 11/02/2013  . H/O jaundice    as child  . Headache 5621   complicated migraine x2   . Hearing trouble   . Heart disease   . Hepatitis    hx of hepatitis A as child  . High cholesterol    under  control  . Long term (current) use of anticoagulants 11/02/2013   Pradaxa   . Measles    as child  . Mouth dryness   . Mumps    as teenager  . Numbness and tingling    lips  . Osteoporosis    "unsure-maybe in knees"  . Psoriasis   . Psoriatic arthritis ([place])   . Stroke ([place]) 2012  . Vertigo    being monitored, occasional    Assessment/Plan: 2 Days Post-Op Procedure(s) (LRB): TOTAL HIP ARTHROPLASTY ANTERIOR APPROACH (Left) Principal Problem:   S/p left hip fracture Active Problems:   Coronary artery disease involving coronary bypass graft of native heart with angina pectoris (HCC)   Atrial flutter (HCC)   Essential hypertension, benign   Complicated migraine   Arthritis   DM (diabetes mellitus), type 2 ([place])   Up with therapy  Anticipate one more day in hospital then home with home health as long as she progresses sufficiently with PT  DVT Prophylaxis - Pradaxa Weight Bearing As Tolerated left Leg  Gaynelle Arabian 04/21/2019, 6:29 AM

## 2019-04-21 NOTE — Progress Notes (Signed)
Physical Therapy Treatment Patient Details Name: [name] MRN: [id number] DOB: 01-28-1934 Today's Date: 04/21/2019    History of Present Illness 85 yo female admitted to ED on 4/18 s/p fall at home. S/p L DA-THA on 4/20. CT head negative for acute findings, ruled out acute CVA more in favor of complex migraine resulting in fall and confusion afterwards. PMH includes angina, OA, aflutter, CAD with CABG 2006, HTN, DM, headache, OP, CVA 2012, psoriasis with psoriatic arthritis, vertigo, lumbar disc surgery, L TKR.    PT Comments    Pt was OOB in recliner.  Good cognition and awareness of her surgery.  Assisted with amb however pt was only able to progress her gait distance to 12 feet.  Pt c/o R knee "buckling".  Pt required + 2 assist for safety and recliner following behind.  Pt was able to practice a one large step up backward with walker.   Pt plans to D/C to Son's house (son is a MD and daughter in law is a Engineer, civil (consulting)).  Pt will need a YOUTH RW as she has a Museum/gallery exhibitions officer at home.  Instructed pt she will need to use a standard RW at first then progress to her rollator.  Pt was given a handout on THR TE's.  Instructed on freq and use of ICE.   Pt plans to D/C tomorrow.  Follow Up Recommendations        Equipment Recommendations  Rolling walker with 5" wheels;3in1 (YOUTH)    Recommendations for Other Services       Precautions / Restrictions Precautions Precautions: Fall Restrictions Weight Bearing Restrictions: No LLE Weight Bearing: Weight bearing as tolerated    Mobility  Bed Mobility               General bed mobility comments: OOB in recliner   Transfers Overall transfer level: Needs assistance Equipment used: Rolling walker (2 wheeled) Transfers: Sit to/from [employer] Sit to Stand: Min assist;Mod assist;+2 physical assistance;+2 safety/equipment Stand pivot transfers: Mod assist;Min assist       General transfer comment: 50% VC's on proper hand  placement and safety with turn completion prior to sit.    Ambulation/Gait Ambulation/Gait assistance: Min assist;+2 safety/equipment Gait Distance (Feet): 12 Feet Assistive device: Rolling walker (2 wheeled) Gait Pattern/deviations: Decreased stride length;Antalgic;Trunk flexed;Step-through pattern Gait velocity: decr    General Gait Details: Min assist for steadying, pt with mild L knee buckling corrected by pt. Verbal cuing for upright posture.   Stairs    one step backward with walker at [employer] Assist          Wheelchair Mobility    Modified Rankin (Stroke Patients Only)       Balance                                            Cognition                                              Exercises      General Comments        Pertinent Vitals/Pain      Home Living                      Prior  Function            PT Goals (current goals can now be found in the care plan section) Progress towards PT goals: Progressing toward goals    Frequency    Min 5X/week      PT Plan Current plan remains appropriate    Co-evaluation              AM-PAC PT "6 Clicks" Mobility   Outcome Measure  Help needed turning from your back to your side while in a flat bed without using bedrails?: A Little Help needed moving from lying on your back to sitting on the side of a flat bed without using bedrails?: A Little Help needed moving to and from a bed to a chair (including a wheelchair)?: A Little Help needed standing up from a chair using your arms (e.g., wheelchair or bedside chair)?: A Little Help needed to walk in hospital room?: A Little Help needed climbing 3-5 steps with a railing? : A Little 6 Click Score: 18    End of Session Equipment Utilized During Treatment: Gait belt Activity Tolerance: Patient limited by fatigue Patient left: in chair;with call bell/phone within reach;with chair alarm set Nurse  Communication: Mobility status PT Visit Diagnosis: Other abnormalities of gait and mobility (R26.89);Muscle weakness (generalized) (M62.81);History of falling (Z91.81)     Time: 1050-1130 PT Time Calculation (min) (ACUTE ONLY): 40 min  Charges:  $Gait Training: 8-22 mins $Therapeutic Exercise: 8-22 mins $Therapeutic Activity: 8-22 mins                     Felecia Shelling  PTA Acute  Rehabilitation Services Pager      [phone] Office      [phone]

## 2019-04-21 NOTE — TOC Initial Note (Addendum)
Transition of Care [facility]) - Initial/Assessment Note    Patient Details  Name: [name] MRN: [id number] Date of Birth: May 18, 1934  Transition of Care [facility]) CM/SW Contact:    Clearance Coots, LCSW Phone Number: 04/21/2019, 11:34 AM  Clinical Narrative: [name] is a 85 y.o. female who complains of left hip pain following a ground-level fall prior to presentation.     TOTAL HIP ARTHROPLASTY ANTERIOR APPROACH (Left) as a surgical intervention.  Patient reports she was fairly independent prior to her fall.                 CSW discussed discharge planning with the patient. Patient agreeable to Home Health services. Patient prefers to use [facility] services. Patient reports her son is working therefore gave CSW permission to discuss Home health plan with her daughter in law [place]. Daughter in law reports the patient will stay at their home at discharge.  Patient 3 IN 1 and walker ordered.   Expected Discharge Plan: Home w Home Health Services Barriers to Discharge: No Barriers Identified   Patient Goals and CMS Choice Patient states their goals for this hospitalization and ongoing recovery are:: Walk without pain CMS Medicare.gov Compare Post Acute Care list provided to:: Patient Represenative (must comment) Choice offered to / list presented to : Patient  Expected Discharge Plan and Services Expected Discharge Plan: Home w Home Health Services In-house Referral: Clinical Social Work     Living arrangements for the past 2 months: Single Family Home Expected Discharge Date: 04/20/19               DME Arranged: 3-N-1, Walker rolling DME Agency: [facility] HH Arranged: PT HH Agency: [facility]  Prior Living Arrangements/Services Living arrangements for the past 2 months: Single Family Home Lives with:: Self   Do you feel safe going back to the place where you live?: Yes      Need for Family Participation in Patient Care: Yes  (Comment) Care giver support system in place?: Yes (comment)   Criminal Activity/Legal Involvement Pertinent to Current Situation/Hospitalization: No - Comment as needed  Activities of Daily Living Home Assistive Devices/Equipment: None ADL Screening (condition at time of admission) Patient's cognitive ability adequate to safely complete daily activities?: Yes Is the patient deaf or have difficulty hearing?: Yes Does the patient have difficulty seeing, even when wearing glasses/contacts?: No Does the patient have difficulty concentrating, remembering, or making decisions?: No Patient able to express need for assistance with ADLs?: Yes Does the patient have difficulty dressing or bathing?: Yes Independently performs ADLs?: No Communication: Needs assistance(seems to have word-finding issues at admission) Is this a change from baseline?: Change from baseline, expected to last <3 days Dressing (OT): Needs assistance Is this a change from baseline?: Change from baseline, expected to last <3days Grooming: Appropriate for developmental age Feeding: Appropriate for developmental age Bathing: Needs assistance Is this a change from baseline?: Change from baseline, expected to last <3 days Toileting: Needs assistance Is this a change from baseline?: Change from baseline, expected to last <3 days In/Out Bed: Needs assistance Is this a change from baseline?: Change from baseline, expected to last <3 days Walks in Home: Needs assistance Is this a change from baseline?: Change from baseline, expected to last <3 days Does the patient have difficulty walking or climbing stairs?: Yes Weakness of Legs: Both Weakness of Arms/Hands: Both  Permission Sought/Granted Permission sought to share information with : Oceanographer granted  to share information with : Yes, Verbal Permission Granted     Permission granted to share info w AGENCY: [facility]  Permission  granted to share info w Relationship: Son/ Daughter in law     Emotional Assessment Appearance:: Appears older than stated age   Affect (typically observed): Accepting, Calm Orientation: : Oriented to Self, Oriented to Place, Oriented to  Time, Oriented to Situation Alcohol / Substance Use: Not Applicable Psych Involvement: No (comment)  Admission diagnosis:  Fall [W19.XXXA] Closed subcapital fracture of femur, left, initial encounter [facility]) [S72.012A] Patient Active Problem List   Diagnosis Date Noted  . S/p left hip fracture 04/17/2019  . Arthritis 04/17/2019  . DM (diabetes mellitus), type 2 (HCC) 04/17/2019  . OA (osteoarthritis) of knee 09/11/2015  . BPPV (benign paroxysmal positional vertigo) 10/31/2014  . Complicated migraine 08/05/2014  . TIA (transient ischemic attack) 08/04/2014  . Coronary artery disease involving coronary bypass graft of native heart with angina pectoris (HCC) 11/02/2013    Class: Chronic  . Atrial flutter (HCC) 11/02/2013    Class: Chronic  . Essential hypertension, benign 11/02/2013  . Long term current use of anticoagulant therapy 11/02/2013    Class: Acute   PCP:  [name], MD Pharmacy:   [facility] [place], [place] - [address] [place] [address] Phone: [phone] Fax: [phone]     Social Determinants of Health (SDOH) Interventions    Readmission Risk Interventions No flowsheet data found.

## 2019-04-22 LAB — GLUCOSE, CAPILLARY
Glucose-Capillary: 190 mg/dL — ABNORMAL HIGH (ref 70–99)
Glucose-Capillary: 248 mg/dL — ABNORMAL HIGH (ref 70–99)

## 2019-04-22 LAB — CBC
HCT: 27.1 % — ABNORMAL LOW (ref 36.0–46.0)
Hemoglobin: 8.9 g/dL — ABNORMAL LOW (ref 12.0–15.0)
MCH: 29.2 pg (ref 26.0–34.0)
MCHC: 32.8 g/dL (ref 30.0–36.0)
MCV: 88.9 fL (ref 80.0–100.0)
Platelets: 127 10*3/uL — ABNORMAL LOW (ref 150–400)
RBC: 3.05 MIL/uL — ABNORMAL LOW (ref 3.87–5.11)
RDW: 12.6 % (ref 11.5–15.5)
WBC: 8.3 10*3/uL (ref 4.0–10.5)
nRBC: 0 % (ref 0.0–0.2)

## 2019-04-22 MED ORDER — DOCUSATE SODIUM 100 MG PO CAPS: 100.0000 mg | ORAL_CAPSULE | Freq: Two times a day (BID) | ORAL | 0 refills | Status: DC

## 2019-04-22 MED ORDER — POLYETHYLENE GLYCOL 3350 17 G PO PACK
17.0000 g | PACK | Freq: Once | ORAL | Status: AC
  Administered 2019-04-22: 17 g via ORAL
  Filled 2019-04-22: qty 1

## 2019-04-22 MED ORDER — BISACODYL 5 MG PO TBEC
5.0000 mg | DELAYED_RELEASE_TABLET | Freq: Once | ORAL | Status: AC
  Administered 2019-04-22: 5 mg via ORAL
  Filled 2019-04-22: qty 1

## 2019-04-22 NOTE — Progress Notes (Signed)
Discharge paperwork given to pt upon d/c. RN spoke with [facility] regarding new meds, home care, equipment, etc in d/c packet.  Pt was escorted in stable condition to main lobby by wheelchair.

## 2019-04-22 NOTE — Progress Notes (Signed)
   Subjective: 3 Days Post-Op Procedure(s) (LRB): TOTAL HIP ARTHROPLASTY ANTERIOR APPROACH (Left) Patient reports pain as mild.   Plan is to go Home after hospital stay.  Objective: Vital signs in last 24 hours: Temp:  [98 F (36.7 C)-98.6 F (37 C)] 98.6 F (37 C) (04/23 0537) Pulse Rate:  [73-80] 73 (04/23 0537) Resp:  [15-18] 16 (04/23 0537) BP: (143-157)/(56-66) 148/56 (04/23 0537) SpO2:  [92 %-99 %] 92 % (04/23 0537)  Intake/Output from previous day:  Intake/Output Summary (Last 24 hours) at 04/22/2019 0618 Last data filed at 04/22/2019 0550 Gross per 24 hour  Intake 1246.6 ml  Output 600 ml  Net 646.6 ml    Intake/Output this shift: Total I/O In: 240 [P.O.:240] Out: 0   Labs: Recent Labs    04/20/19 0428 04/21/19 0438 04/22/19 0422  HGB 9.7* 8.8* 8.9*   Recent Labs    04/21/19 0438 04/22/19 0422  WBC 7.8 8.3  RBC 3.01* 3.05*  HCT 26.6* 27.1*  PLT 127* 127*   Recent Labs    04/20/19 0428 04/21/19 0438  NA 134* 136  K 4.4 4.2  CL 109 106  CO2 19* 22  BUN 24* 26*  CREATININE 0.66 0.61  GLUCOSE 287* 271*  CALCIUM 8.0* 8.2*   No results for input(s): LABPT, INR in the last 72 hours.  EXAM General - Patient is Alert and Appropriate Extremity - Neurologically intact Neurovascular intact No cellulitis present Compartment soft Dressing/Incision - clean, dry, minimal serous drainage from drain site Motor Function - intact, moving foot and toes well on exam.   Past Medical History:  Diagnosis Date  . Anginal pain ([place])    "saw doctor-think it was esophagus spasm"  . Arthritis   . Atrial flutter ([place]) 11/02/2013  . Cataract    left eye  . Coronary artery disease   . Diabetes mellitus without complication ([place])    type 2  . Essential hypertension, benign 11/02/2013  . H/O jaundice    as child  . Headache 9563   complicated migraine x2   . Hearing trouble   . Heart disease   . Hepatitis    hx of hepatitis A as child  . High cholesterol     under control  . Long term (current) use of anticoagulants 11/02/2013   Pradaxa   . Measles    as child  . Mouth dryness   . Mumps    as teenager  . Numbness and tingling    lips  . Osteoporosis    "unsure-maybe in knees"  . Psoriasis   . Psoriatic arthritis ([place])   . Stroke ([place]) 2012  . 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

## 2019-04-22 NOTE — Discharge Summary (Signed)
Physician Discharge Summary   Patient ID: [name] MRN: [id number] DOB/AGE: [birth year] 85 y.o.  Admit date: 04/17/2019 Discharge date: 04/22/2019  Primary Care Physician:  [name], MD   Recommendations for Outpatient Follow-up:  Follow-up with orthopedics, Dr. [name]    Home Health: Home health PT OT Equipment/Devices: 3 n 1  Discharge Condition: stable CODE STATUS: FULL  Diet recommendation: Carb modified diet   Discharge Diagnoses:   Left hip fracture status post total hip arthroplasty Acute metabolic encephalopathy resolved . Coronary artery disease involving coronary bypass graft of native heart with angina pectoris (HCC) . Complicated migraine . Atrial flutter (HCC) . Essential hypertension, benign Diabetes mellitus type 2 Acute blood loss anemia postop  Consults: Orthopedics Neurology    Allergies:   Allergies  Allergen Reactions  . Codeine Nausea And Vomiting  . Penicillins Swelling and Other (See Comments)    Did it involve swelling of the face/tongue/throat, SOB, or low BP? Yes Did it involve sudden or severe rash/hives, skin peeling, or any reaction on the inside of your mouth or nose? Unknown Did you need to seek medical attention at a hospital or doctor's office? Unknown When did it last happen?Unknown If all above answers are "NO", may proceed with cephalosporin use.  Swelling of tongue  . Atenolol Other (See Comments)  . Atorvastatin     Muscle soreness  . Metoprolol Other (See Comments)  . Zetia [Ezetimibe] Other (See Comments)     DISCHARGE MEDICATIONS: Allergies as of 04/22/2019      Reactions   Codeine Nausea And Vomiting   Penicillins Swelling, Other (See Comments)   Did it involve swelling of the face/tongue/throat, SOB, or low BP? Yes Did it involve sudden or severe rash/hives, skin peeling, or any reaction on the inside of your mouth or nose? Unknown Did you need to seek medical attention at a hospital or doctor's  office? Unknown When did it last happen?Unknown If all above answers are "NO", may proceed with cephalosporin use. Swelling of tongue   Atenolol Other (See Comments)   Atorvastatin    Muscle soreness   Metoprolol Other (See Comments)   Zetia [ezetimibe] Other (See Comments)      Medication List    STOP taking these medications   cyclobenzaprine 10 MG tablet Commonly known as:  FLEXERIL     TAKE these medications   acetaminophen 500 MG tablet Commonly known as:  TYLENOL Take 500 mg by mouth every 6 (six) hours as needed for mild pain.   ALIGN PO Take 1 capsule by mouth daily.   carvedilol 25 MG tablet Commonly known as:  COREG TAKE (1) TABLET TWICE A DAY WITH FOOD.   cholecalciferol 1000 units tablet Commonly known as:  VITAMIN D Take 2,000 Units by mouth daily.   CLARITIN PO Take 1 tablet by mouth daily.   clobetasol cream 0.05 % Commonly known as:  TEMOVATE Apply 1 application topically 2 (two) times daily as needed (skin irritation).   clonazePAM 0.5 MG tablet Commonly known as:  KLONOPIN Take 0.5 mg by mouth at bedtime as needed (for sleep).   diclofenac sodium 1 % Gel Commonly known as:  VOLTAREN Apply 2 g topically 4 (four) times daily as needed (pain).   docusate sodium 100 MG capsule Commonly known as:  COLACE Take 1 capsule (100 mg total) by mouth 2 (two) times daily. For constipation   Enbrel 50 MG/ML injection Generic drug:  etanercept Inject 50 mg into the skin once a week.  Lantus SoloStar 100 UNIT/ML Solostar Pen Generic drug:  Insulin Glargine Inject 50 Units into the skin every morning.   methocarbamol 500 MG tablet Commonly known as:  ROBAXIN Take 1 tablet (500 mg total) by mouth every 6 (six) hours as needed for muscle spasms.   nitroGLYCERIN 0.4 MG SL tablet Commonly known as:  NITROSTAT Place 0.4 mg under the tongue every 5 (five) minutes x 3 doses as needed for chest pain.   nystatin-triamcinolone ointment Commonly known as:   MYCOLOG Apply 1 application topically as directed.   Pradaxa 150 MG Caps capsule Generic drug:  dabigatran Take 150 mg by mouth 2 (two) times daily.   ramipril 5 MG capsule Commonly known as:  ALTACE Take 1 capsule (5 mg total) by mouth 2 (two) times daily.   rosuvastatin 10 MG tablet Commonly known as:  CRESTOR Take 10 mg by mouth at bedtime.   senna 8.6 MG tablet Commonly known as:  SENOKOT Take 1 tablet by mouth at bedtime as needed for constipation.   traMADol 50 MG tablet Commonly known as:  ULTRAM Take 1-2 tablets (50-100 mg total) by mouth every 6 (six) hours as needed for moderate pain.   traZODone 50 MG tablet Commonly known as:  DESYREL Take 25 mg by mouth at bedtime.   Tums Ultra 1000 400 MG chewable tablet Generic drug:  calcium elemental as carbonate Chew 1,000 mg by mouth every evening.   Victoza 18 MG/3ML Sopn Generic drug:  liraglutide Inject 1.8 mg into the skin daily.   vitamin B-12 250 MCG tablet Commonly known as:  CYANOCOBALAMIN Take 250 mcg by mouth daily.            Durable Medical Equipment  (From admission, onward)         Start     Ordered   04/21/19 0826  For home use only DME 3 n 1  Once     04/21/19 0825   04/21/19 0825  For home use only DME Walker rolling  Once    Question:  Patient needs a walker to treat with the following condition  Answer:  S/p left hip fracture   04/21/19 0825           Discharge Care Instructions  (From admission, onward)         Start     Ordered   04/20/19 0000  Weight bearing as tolerated     04/20/19 1559   04/20/19 0000  Change dressing    Comments:  You may change your dressing on Wednesday, then change the dressing daily with sterile 4 x 4 inch gauze dressing and paper tape.   04/20/19 1559           Brief H and P: For complete details please refer to admission H and P, but in brief 85 year old female with history of atrial flutter, CAD, diabetes mellitus type 2, hypertension,  hyperlipidemia, CVA, complex migraines, presented after a fall and left hip fracture. She was oriented when she first arrived in the ED but became confused while she was in the ED.  Per her son, Dr [name], she has become confused from narcotics in the past. In addition she had an episode in 11/19 of change in speech and right frontal headache. CVA work up was negative and she was suspected to have complicated migraines. She was having 2-3 episodes a week which stopped after her Metfomin was discontinued.  Hospital Course:  Left hip fracture -Secondary to mechanical fall -  Left hip x-ray showed subcapital left femoral neck fracture -Orthopedics was consulted, underwent left total hip arthroplasty, anterior approach on 04/19/2019, postop day 3 --Continue Pradaxa -PT evaluation recommended home health PT OT   Acute metabolic encephalopathy -Currently resolved, alert and oriented x4.  Telemetry unremarkable. -CT head showed no acute abnormalities, CT angiogram of the head negative -Neurology was consulted, underwent EEG which was normal, no focal lateralizing or epileptic form seizures -Outpatient follow-up with neurology and MRI if she continues to have these episodes.    Coronary artery disease involving coronary bypass graft of native heart with angina pectoris (HCC) -No acute issues, history of CABG -Continue statin, Coreg     Atrial flutter (HCC) -Currently normal sinus rhythm, continue Coreg -Continue Pradaxa     Essential hypertension, benign -Continue Coreg    Complicated migraine -Currently stable    Arthritis -Continue pain control, avoid narcotics    DM (diabetes mellitus), type 2 (HCC) -CBGs uncontrolled likely due to Decadron received inpatient -Patient was continued on Lantus, sliding scale insulin moderate and meal coverage.  CBGs are now improving.  She can resume her home regimen.    Acute anemia secondary to blood loss postop  -Hemoglobin 8.9, no  acute issues   Day of Discharge S: No specific complaints, looking forward to going home today.  Working with OT  BP (!) 148/56 (BP Location: Right Arm)   Pulse 73   Temp 98.6 F (37 C) (Oral)   Resp 16   Ht 5\' 2"  (1.575 m)   Wt 81.9 kg   SpO2 92%   BMI 33.02 kg/m   Physical Exam: General: Alert and awake oriented x3 not in any acute distress. HEENT: anicteric sclera, pupils reactive to light and accommodation CVS: S1-S2 clear no murmur rubs or gallops Chest: clear to auscultation bilaterally, no wheezing rales or rhonchi Abdomen: soft nontender, nondistended, normal bowel sounds Extremities: no cyanosis, clubbing or edema noted bilaterally Neuro: No new deficits   The results of significant diagnostics from this hospitalization (including imaging, microbiology, ancillary and laboratory) are listed below for reference.      Procedures/Studies:  Ct Angio Head W Or Wo Contrast  Result Date: 04/17/2019 CLINICAL DATA:  85 year old female with worsening mental status status post fall with left femoral neck fracture. Increasing confusion. EXAM: CT ANGIOGRAPHY HEAD TECHNIQUE: Multidetector CT imaging of the head was performed using the standard protocol during bolus administration of intravenous contrast. Multiplanar CT image reconstructions and MIPs were obtained to evaluate the vascular anatomy. CONTRAST:  OMNIPAQUE IOHEXOL 350 MG/ML SOLN COMPARISON:  Noncontrast head CTs today 2003 hours and earlier. FINDINGS: Posterior circulation: Codominant distal vertebral arteries are patent to the vertebrobasilar junction without stenosis. Normal PICA origins. Patent basilar artery without stenosis. Patent SCA origins. Normal right PCA origin. Fetal type left PCA origin. Right posterior communicating artery is diminutive or absent. Bilateral PCA branches are within normal limits. Anterior circulation: Tortuous distal cervical ICAs, more so on the right. Patent bilateral ICA siphons. On the  left there is calcified atherosclerosis but only mild left siphon stenosis. Normal left ophthalmic and posterior communicating artery origins. On the right there is similar calcified atherosclerosis with mild right siphon stenosis. Patent carotid termini, MCA and ACA origins. Diminutive or absent anterior communicating artery. Bilateral ACA branches are within normal limits. Left MCA M1 segment and trifurcation are patent without stenosis. Left MCA branches are within normal limits. Right MCA M1 segment and trifurcation are patent without stenosis. Right MCA branches are within  normal limits. Venous sinuses: Major venous structures appear to be patent on the delayed images. Anatomic variants: Fetal type left PCA origin. Delayed phase: No abnormal enhancement identified. Gray-white matter differentiation appears stable since 1249 hours earlier today, including chronic small left SCA territory infarct. IMPRESSION: 1. Negative for age intracranial CTA. No intracranial hemodynamically significant stenosis or arterial occlusion identified. 2. Stable CT appearance of the brain since 1249 hours today. Electronically Signed   By: Odessa Fleming M.D.   On: 04/17/2019 20:57   Ct Head Wo Contrast  Result Date: 04/17/2019 CLINICAL DATA:  New onset of confusion.  Blood thinners. EXAM: CT HEAD WITHOUT CONTRAST TECHNIQUE: Contiguous axial images were obtained from the base of the skull through the vertex without intravenous contrast. COMPARISON:  April 17, 2019 FINDINGS: Brain: No subdural, epidural, or subarachnoid hemorrhage. Stable left superior cerebellar infarct. Cerebellum, brainstem, and basal cisterns are otherwise normal. Scattered white matter changes are noted. Ventricles and sulci are unremarkable. No mass effect or midline shift. No acute cortical ischemia or infarct. Vascular: No hyperdense vessel or unexpected calcification. Skull: Normal. Negative for fracture or focal lesion. Sinuses/Orbits: No acute finding. Other:  None. IMPRESSION: 1. No acute intracranial abnormalities. Scattered white matter changes. Chronic left cerebellar infarct. No intracranial hemorrhage noted. Electronically Signed   By: Gerome Sam III M.D   On: 04/17/2019 13:18   Ct Head Wo Contrast  Result Date: 04/17/2019 CLINICAL DATA:  Unwitnessed fall. EXAM: CT HEAD WITHOUT CONTRAST TECHNIQUE: Contiguous axial images were obtained from the base of the skull through the vertex without intravenous contrast. COMPARISON:  Head CT and MRI 11/15/2018 FINDINGS: Brain: There is no evidence of acute infarct, intracranial hemorrhage, mass, midline shift, or extra-axial fluid collection. Mild cerebral atrophy is within normal limits for age. A chronic superior left cerebellar infarct is unchanged. Patchy cerebral white matter hypodensities are unchanged and nonspecific but compatible with moderate chronic small vessel ischemic disease. Vascular: Calcified atherosclerosis at the skull base. No hyperdense vessel. Skull: No fracture or focal osseous lesion. Sinuses/Orbits: Visualized paranasal sinuses and mastoid air cells are clear. Bilateral cataract extraction is noted. Other: None. IMPRESSION: 1. No evidence of acute intracranial abnormality. 2. Moderate chronic small vessel ischemic disease. Electronically Signed   By: Sebastian Ache M.D.   On: 04/17/2019 11:36   Dg Pelvis Portable  Result Date: 04/19/2019 CLINICAL DATA:  Status post left hip replacement today. EXAM: PORTABLE PELVIS 1-2 VIEWS COMPARISON:  Intraoperative images earlier today. FINDINGS: Left hip arthroplasty is in place. Surgical drain is noted. No fracture or dislocation. Gas in the soft tissues from surgery noted. IMPRESSION: Status post left hip replacement.  No acute finding. Electronically Signed   By: Drusilla Kanner M.D.   On: 04/19/2019 10:10   Dg Chest Port 1 View  Result Date: 04/17/2019 CLINICAL DATA:  Unwitnessed fall. EXAM: PORTABLE CHEST 1 VIEW COMPARISON:  March 24, 2006  FINDINGS: The heart size and mediastinal contours are within normal limits. Both lungs are clear. The visualized skeletal structures are unremarkable. IMPRESSION: No active disease. Electronically Signed   By: Gerome Sam III M.D   On: 04/17/2019 11:32   Dg C-arm 1-60 Min-no Report  Result Date: 04/19/2019 Fluoroscopy was utilized by the requesting physician.  No radiographic interpretation.   Dg Hip Operative Unilat With Pelvis Left  Result Date: 04/19/2019 CLINICAL DATA:  Repair of left femoral neck fracture EXAM: OPERATIVE LEFT HIP (WITH PELVIS IF PERFORMED) 2 VIEWS TECHNIQUE: Fluoroscopic spot image(s) were submitted for interpretation post-operatively.  COMPARISON:  04/17/2027 left hip radiographs FINDINGS: Fluoroscopy time 0 minutes 7 seconds. Two spot fluoroscopic intraoperative radiographs demonstrate postsurgical changes from left total hip arthroplasty with no evidence of left hip malalignment on these views. IMPRESSION: Intraoperative fluoroscopic guidance for left total hip arthroplasty. Electronically Signed   By: Delbert Phenix M.D.   On: 04/19/2019 08:53   Dg Hip Unilat W Or Wo Pelvis 2-3 Views Left  Result Date: 04/17/2019 CLINICAL DATA:  LEFT hip pain EXAM: DG HIP (WITH OR WITHOUT PELVIS) 2-3V LEFT COMPARISON:  None. FINDINGS: There is shortening of the LEFT femoral neck. There is linear superimposition of bone density along the inferior margin. These findings are consistent with LEFT femoral neck fracture. Fracture appears to be subcapital in location. No significant angulation. There is bilateral joint space narrowing of the hip joints. Sclerosis of the acetabular roofs. IMPRESSION: 1. Subcapital LEFT femoral neck fracture. 2. Bilateral osteoarthritis of the hip joints. Electronically Signed   By: Genevive Bi M.D.   On: 04/17/2019 11:34   Ct Head Code Stroke Wo Contrast  Addendum Date: 04/17/2019   ADDENDUM REPORT: 04/17/2019 20:36 ADDENDUM: Study discussed by telephone with  Dr. Stevie Kern on 04/17/2019 at 2032 hours. Electronically Signed   By: Odessa Fleming M.D.   On: 04/17/2019 20:36   Result Date: 04/17/2019 CLINICAL DATA:  Code stroke. 85 year old female status post fall earlier today, confusion. EXAM: CT HEAD WITHOUT CONTRAST TECHNIQUE: Contiguous axial images were obtained from the base of the skull through the vertex without intravenous contrast. COMPARISON:  Head CT 1249 hours today and earlier. FINDINGS: This exam is moderately degraded by motion artifact despite repeated imaging attempts. Brain: No intracranial mass effect or ventriculomegaly. No acute intracranial hemorrhage identified. Left superior cerebellar infarct appears stable since November 2019. Supratentorial gray-white matter differentiation is stable within the limitations of this exam. Vascular: Calcified atherosclerosis at the skull base. Skull: No acute osseous abnormality identified. Sinuses/Orbits: Stable and generally well pneumatized. Other: No acute orbit or scalp soft tissue finding. ASPECTS [facility] Stroke Program Early CT Score) - suboptimal evaluation due to motion, estimated ASPECTS 10. IMPRESSION: 1. Moderately motion degraded exam despite repeated imaging attempts. 2. Grossly stable from head CTs earlier today, no acute intracranial abnormality identified. Electronically Signed: By: Odessa Fleming M.D. On: 04/17/2019 20:24       LAB RESULTS: Basic Metabolic Panel: Recent Labs  Lab 04/20/19 0428 04/21/19 0438  NA 134* 136  K 4.4 4.2  CL 109 106  CO2 19* 22  GLUCOSE 287* 271*  BUN 24* 26*  CREATININE 0.66 0.61  CALCIUM 8.0* 8.2*   Liver Function Tests: No results for input(s): AST, ALT, ALKPHOS, BILITOT, PROT, ALBUMIN in the last 168 hours. No results for input(s): LIPASE, AMYLASE in the last 168 hours. No results for input(s): AMMONIA in the last 168 hours. CBC: Recent Labs  Lab 04/17/19 1048  04/21/19 0438 04/22/19 0422  WBC 10.0   < > 7.8 8.3  NEUTROABS 7.8*  --   --    --   HGB 12.7   < > 8.8* 8.9*  HCT 40.0   < > 26.6* 27.1*  MCV 88.9   < > 88.4 88.9  PLT 156   < > 127* 127*   < > = values in this interval not displayed.   Cardiac Enzymes: Recent Labs  Lab 04/18/19 0257 04/18/19 0843  TROPONINI <0.03 <0.03   BNP: Invalid input(s): POCBNP CBG: Recent Labs  Lab 04/21/19 2113 04/22/19 0720  GLUCAP 269* 190*      Disposition and Follow-up: Discharge Instructions    Call MD / Call 911   Complete by:  As directed    If you experience chest pain or shortness of breath, CALL 911 and be transported to the hospital emergency room.  If you develope a fever above 101 F, pus (white drainage) or increased drainage or redness at the wound, or calf pain, call your surgeon's office.   Change dressing   Complete by:  As directed    You may change your dressing on Wednesday, then change the dressing daily with sterile 4 x 4 inch gauze dressing and paper tape.   Constipation Prevention   Complete by:  As directed    Drink plenty of fluids.  Prune juice may be helpful.  You may use a stool softener, such as Colace (over the counter) 100 mg twice a day.  Use MiraLax (over the counter) for constipation as needed.   Diet - low sodium heart healthy   Complete by:  As directed    Diet Carb Modified   Complete by:  As directed    Discharge instructions   Complete by:  As directed    Dr. Ollen Gross Total Joint Specialist [facility] [address], [address] [place], [place] [address] [phone]  ANTERIOR APPROACH TOTAL HIP REPLACEMENT POSTOPERATIVE DIRECTIONS   Hip Rehabilitation, Guidelines Following Surgery  The results of a hip operation are greatly improved after range of motion and muscle strengthening exercises. Follow all safety measures which are given to protect your hip. If any of these exercises cause increased pain or swelling in your joint, decrease the amount until you are comfortable again. Then slowly increase the exercises. Call  your caregiver if you have problems or questions.   HOME CARE INSTRUCTIONS  Remove items at home which could result in a fall. This includes throw rugs or furniture in walking pathways.  ICE to the affected hip every three hours for 30 minutes at a time and then as needed for pain and swelling.  Continue to use ice on the hip for pain and swelling from surgery. You may notice swelling that will progress down to the foot and ankle.  This is normal after surgery.  Elevate the leg when you are not up walking on it.   Continue to use the breathing machine which will help keep your temperature down.  It is common for your temperature to cycle up and down following surgery, especially at night when you are not up moving around and exerting yourself.  The breathing machine keeps your lungs expanded and your temperature down.  DIET You may resume your previous home diet once your are discharged from the hospital.  DRESSING / WOUND CARE / SHOWERING You may change your dressing 3-5 days after surgery.  Then change the dressing every day with sterile gauze.  Please use good hand washing techniques before changing the dressing.  Do not use any lotions or creams on the incision until instructed by your surgeon. You may start showering once you are discharged home but do not submerge the incision under water. Just pat the incision dry and apply a dry gauze dressing on daily. Change the surgical dressing daily and reapply a dry dressing each time.  ACTIVITY Walk with your walker as instructed. Use walker as long as suggested by your caregivers. Avoid periods of inactivity such as sitting longer than an hour when not asleep. This helps prevent blood  clots.  You may resume a sexual relationship in one month or when given the OK by your doctor.  You may return to work once you are cleared by your doctor.  Do not drive a car for 6 weeks or until released by you surgeon.  Do not drive while taking  narcotics.  WEIGHT BEARING Weight bearing as tolerated with assist device (walker, cane, etc) as directed, use it as long as suggested by your surgeon or therapist, typically at least 4-6 weeks.  POSTOPERATIVE CONSTIPATION PROTOCOL Constipation - defined medically as fewer than three stools per week and severe constipation as less than one stool per week.  One of the most common issues patients have following surgery is constipation.  Even if you have a regular bowel pattern at home, your normal regimen is likely to be disrupted due to multiple reasons following surgery.  Combination of anesthesia, postoperative narcotics, change in appetite and fluid intake all can affect your bowels.  In order to avoid complications following surgery, here are some recommendations in order to help you during your recovery period.  Colace (docusate) - Pick up an over-the-counter form of Colace or another stool softener and take twice a day as long as you are requiring postoperative pain medications.  Take with a full glass of water daily.  If you experience loose stools or diarrhea, hold the colace until you stool forms back up.  If your symptoms do not get better within 1 week or if they get worse, check with your doctor.  Dulcolax (bisacodyl) - Pick up over-the-counter and take as directed by the product packaging as needed to assist with the movement of your bowels.  Take with a full glass of water.  Use this product as needed if not relieved by Colace only.   MiraLax (polyethylene glycol) - Pick up over-the-counter to have on hand.  MiraLax is a solution that will increase the amount of water in your bowels to assist with bowel movements.  Take as directed and can mix with a glass of water, juice, soda, coffee, or tea.  Take if you go more than two days without a movement. Do not use MiraLax more than once per day. Call your doctor if you are still constipated or irregular after using this medication for 7 days  in a row.  If you continue to have problems with postoperative constipation, please contact the office for further assistance and recommendations.  If you experience "the worst abdominal pain ever" or develop nausea or vomiting, please contact the office immediatly for further recommendations for treatment.  ITCHING  If you experience itching with your medications, try taking only a single pain pill, or even half a pain pill at a time.  You can also use Benadryl over the counter for itching or also to help with sleep.   TED HOSE STOCKINGS Wear the elastic stockings on both legs for three weeks following surgery during the day but you may remove then at night for sleeping.  MEDICATIONS See your medication summary on the "After Visit Summary" that the nursing staff will review with you prior to discharge.  You may have some home medications which will be placed on hold until you complete the course of blood thinner medication.  It is important for you to complete the blood thinner medication as prescribed by your surgeon.  Continue your approved medications as instructed at time of discharge.  PRECAUTIONS If you experience chest pain or shortness of breath - call 911  immediately for transfer to the hospital emergency department.  If you develop a fever greater that 101 F, purulent drainage from wound, increased redness or drainage from wound, foul odor from the wound/dressing, or calf pain - CONTACT YOUR SURGEON.                                                   FOLLOW-UP APPOINTMENTS Make sure you keep all of your appointments after your operation with your surgeon and caregivers. You should call the office at the above phone number and make an appointment for approximately two weeks after the date of your surgery or on the date instructed by your surgeon outlined in the "After Visit Summary".  RANGE OF MOTION AND STRENGTHENING EXERCISES  These exercises are designed to help you keep full  movement of your hip joint. Follow your caregiver's or physical therapist's instructions. Perform all exercises about fifteen times, three times per day or as directed. Exercise both hips, even if you have had only one joint replacement. These exercises can be done on a training (exercise) mat, on the floor, on a table or on a bed. Use whatever works the best and is most comfortable for you. Use music or television while you are exercising so that the exercises are a pleasant break in your day. This will make your life better with the exercises acting as a break in routine you can look forward to.  Lying on your back, slowly slide your foot toward your buttocks, raising your knee up off the floor. Then slowly slide your foot back down until your leg is straight again.  Lying on your back spread your legs as far apart as you can without causing discomfort.  Lying on your side, raise your upper leg and foot straight up from the floor as far as is comfortable. Slowly lower the leg and repeat.  Lying on your back, tighten up the muscle in the front of your thigh (quadriceps muscles). You can do this by keeping your leg straight and trying to raise your heel off the floor. This helps strengthen the largest muscle supporting your knee.  Lying on your back, tighten up the muscles of your buttocks both with the legs straight and with the knee bent at a comfortable angle while keeping your heel on the floor.   IF YOU ARE TRANSFERRED TO A SKILLED REHAB FACILITY If the patient is transferred to a skilled rehab facility following release from the hospital, a list of the current medications will be sent to the facility for the patient to continue.  When discharged from the skilled rehab facility, please have the facility set up the patient's Home Health Physical Therapy prior to being released. Also, the skilled facility will be responsible for providing the patient with their medications at time of release from the  facility to include their pain medication, the muscle relaxants, and their blood thinner medication. If the patient is still at the rehab facility at time of the two week follow up appointment, the skilled rehab facility will also need to assist the patient in arranging follow up appointment in our office and any transportation needs.  MAKE SURE YOU:  Understand these instructions.  Get help right away if you are not doing well or get worse.    Pick up stool softner and laxative for home  use following surgery while on pain medications. Do not submerge incision under water. Please use good hand washing techniques while changing dressing each day. May shower starting three days after surgery. Please use a clean towel to pat the incision dry following showers. Continue to use ice for pain and swelling after surgery. Do not use any lotions or creams on the incision until instructed by your surgeon.   Discharge instructions   Complete by:  As directed    Please don't take Flexeril while you are on Robaxin for muscle spasms.   Do not sit on low chairs, stoools or toilet seats, as it may be difficult to get up from low surfaces   Complete by:  As directed    Driving restrictions   Complete by:  As directed    No driving for two weeks   Increase activity slowly   Complete by:  As directed    TED hose   Complete by:  As directed    Use stockings (TED hose) for three weeks on both leg(s).  You may remove them at night for sleeping.   Weight bearing as tolerated   Complete by:  As directed        DISPOSITION: Home with home health PT OT   DISCHARGE FOLLOW-UP Follow-up Information    Ollen Gross, MD. Schedule an appointment as soon as possible for a visit on 05/04/2019.   Specialty:  Orthopedic Surgery Contact information: [address] [place] [address] [phone]        [facility] Follow up.   Specialty:  Home Health Services            Time coordinating discharge:  35-minutes  Signed:   Thad Ranger M.D. [facility] 04/22/2019, 10:42 AM

## 2019-04-22 NOTE — TOC Transition Note (Addendum)
Transition of Care [facility]) - CM/SW Discharge Note   Patient Details  Name: [name] MRN: [id number] Date of Birth: [birth year]  Transition of Care [facility]) CM/SW Contact:  Lia Hopping, [place] Phone Number: 04/22/2019, 10:38 AM   Clinical Narrative:    Patient daughter in law to transport the patient home.  -[facility] will begin services 4/24   Final next level of care: [place] Barriers to Discharge: No Barriers Identified  Patient Goals and CMS Choice Patient states their goals for this hospitalization and ongoing recovery are:: Walk without pain CMS Medicare.gov Compare Post Acute Care list provided to:: Patient Represenative (must comment) Choice offered to / list presented to : Patient  Discharge Placement                Patient to be transferred to facility by: Daughter in law Name of family member notified: Glenard Haring Patient and family notified of of transfer: 04/22/19  Discharge Plan and Services In-house Referral: Clinical Social Work              DME Arranged: 3-N-1, Environmental consultant rolling DME Agency: [employer] Date DME Agency Contacted: 04/21/19   Representative spoke with at DME Agency: Santiago Glad [phone] [facility] Arranged: PT [place]: [place] ([place])       Social Determinants of Health (SDOH) Interventions     Readmission Risk Interventions No flowsheet data found.

## 2019-04-22 NOTE — Progress Notes (Signed)
Physical Therapy Treatment Patient Details Name: [name] MRN: [id number] DOB: 07-22-1934 Today's Date: 04/22/2019    History of Present Illness 85 yo female admitted to ED on 4/18 s/p fall at home. S/p L DA-THA on 4/20. CT head negative for acute findings, ruled out acute CVA more in favor of complex migraine resulting in fall and confusion afterwards. PMH includes angina, OA, aflutter, CAD with CABG 2006, HTN, DM, headache, OP, CVA 2012, psoriasis with psoriatic arthritis, vertigo, lumbar disc surgery, L TKR.    PT Comments    POD # 3 Pt progressing well with her mobility.  General transfer comment: pt self able with close guard for safety and 50% VC's on proper hand placement with sit to stand and stand to sit.  General Gait Details: assist for safety, tolerated distance but limited by c/o fatigue.  Steady.  Increased ability to advance L LE with decreased c/o pain.  General stair comments: 75% VC's on proper tech up baclward due to weakness "one large step"  Up with R/down with L.  Then returned to room to perform some TE's following HEP handout.  Instructed on proper tech, freq as well as use of ICE.   Addressed all mobility questions, discussed appropriate activity, educated on use of ICE.  Pt ready for D/C to home.   Follow Up Recommendations  Home health PT;Supervision/Assistance - 24 hour     Equipment Recommendations  Rolling walker with 5" wheels;3in1 (PT)(youth)    Recommendations for Other Services       Precautions / Restrictions Precautions Precautions: Fall Restrictions Weight Bearing Restrictions: No LLE Weight Bearing: Weight bearing as tolerated    Mobility  Bed Mobility               General bed mobility comments: OOB in recliner   Transfers Overall transfer level: Needs assistance   Transfers: Sit to/from Stand;Stand Pivot Transfers Sit to Stand: Supervision;Min guard Stand pivot transfers: Supervision;Min guard       General transfer  comment: pt self able with close guard for safety and 50% VC's on proper hand placement with sit to stand and stand to sit.    Ambulation/Gait Ambulation/Gait assistance: Min guard;Min assist Gait Distance (Feet): 15 Feet Assistive device: Rolling walker (2 wheeled) Gait Pattern/deviations: Decreased stride length;Antalgic;Trunk flexed;Step-through pattern Gait velocity: decr    General Gait Details: assist for safety, tolerated distance but limited by c/o fatigue.  Steady.  Increased ability to advance L LE with decreased c/o pain.     Stairs Stairs: Yes Stairs assistance: Min assist Stair Management: No rails;Step to pattern;Backwards;With walker Number of Stairs: 1 General stair comments: 75% VC's on proper tech up baclward due to weakness "one large step"  Up with R/down with L   Wheelchair Mobility    Modified Rankin (Stroke Patients Only)       Balance                                            Cognition                                       General Comments: AxOx4 pleasant but increased sleepy/groggy this session (pain meds?) but still following all directions /instructions       Exercises   Total  Hip Replacement TE's 10 reps ankle pumps 10 reps knee presses 10 reps heel slides 10 reps SAQ's 10 reps ABD Followed by ICE     General Comments        Pertinent Vitals/Pain Pain Assessment: 0-10 Pain Score: 2  Pain Location: L hip Pain Descriptors / Indicators: Discomfort Pain Intervention(s): Monitored during session;Repositioned;Premedicated before session;Ice applied    Home Living                      Prior Function            PT Goals (current goals can now be found in the care plan section) Progress towards PT goals: Progressing toward goals    Frequency    Min 5X/week      PT Plan Current plan remains appropriate    Co-evaluation              AM-PAC PT "6 Clicks" Mobility    Outcome Measure  Help needed turning from your back to your side while in a flat bed without using bedrails?: A Little Help needed moving from lying on your back to sitting on the side of a flat bed without using bedrails?: A Little Help needed moving to and from a bed to a chair (including a wheelchair)?: A Little Help needed standing up from a chair using your arms (e.g., wheelchair or bedside chair)?: A Little Help needed to walk in hospital room?: A Little Help needed climbing 3-5 steps with a railing? : A Lot 6 Click Score: 17    End of Session Equipment Utilized During Treatment: Gait belt Activity Tolerance: Patient limited by fatigue Patient left: in chair;with call bell/phone within reach;with chair alarm set Nurse Communication: Mobility status(pt ready for D/C ) PT Visit Diagnosis: Other abnormalities of gait and mobility (R26.89);Muscle weakness (generalized) (M62.81);History of falling (Z91.81)     Time: 1030-1110 PT Time Calculation (min) (ACUTE ONLY): 40 min  Charges:  $Gait Training: 8-22 mins $Therapeutic Exercise: 8-22 mins $Therapeutic Activity: 8-22 mins                     Felecia Shelling  PTA Acute  Rehabilitation Services Pager      [phone] Office      [phone]

## 2019-04-22 NOTE — Progress Notes (Signed)
Occupational Therapy Treatment Patient Details Name: [name] MRN: [id number] DOB: May 05, 1934 Today's Date: 04/22/2019    History of present illness 85 yo female admitted to ED on 4/18 s/p fall at home. S/p L DA-THA on 4/20. CT head negative for acute findings, ruled out acute CVA more in favor of complex migraine resulting in fall and confusion afterwards. PMH includes angina, OA, aflutter, CAD with CABG 2006, HTN, DM, headache, OP, CVA 2012, psoriasis with psoriatic arthritis, vertigo, lumbar disc surgery, L TKR.   OT comments  Pt used commode. Demonstrated shower transfer:  Pt did not want to practice today Handout given  Follow Up Recommendations  Supervision/Assistance - 24 hour    Equipment Recommendations  3 in 1 bedside commode    Recommendations for Other Services      Precautions / Restrictions Precautions Precautions: Fall Restrictions Weight Bearing Restrictions: No LLE Weight Bearing: Weight bearing as tolerated       Mobility Bed Mobility               General bed mobility comments: oob  Transfers   Equipment used: Rolling walker (2 wheeled)   Sit to Stand: Min assist         General transfer comment: min A to power up and stabilize    Balance                                           ADL either performed or assessed with clinical judgement   ADL                           Toilet Transfer: Minimal assistance;Ambulation;BSC;RW   Toileting- Clothing Manipulation and Hygiene: Minimal assistance;Sit to/from stand         General ADL Comments: assist to stand while performing hygiene.  Pt did not want to practice shower transfer:  verbalizes understanding of sequence.  Handout given     Vision       Perception     Praxis      Cognition Arousal/Alertness: Awake/alert Behavior During Therapy: WFL for tasks assessed/performed Overall Cognitive Status: Within Functional Limits for tasks assessed                                           Exercises     Shoulder Instructions       General Comments      Pertinent Vitals/ Pain       Pain Score: 5  Pain Location: L hip, with standing Pain Descriptors / Indicators: Discomfort;Tender;Sore Pain Intervention(s): Limited activity within patient's tolerance;Monitored during session;Repositioned;Patient requesting pain meds-RN notified  Home Living                                          Prior Functioning/Environment              Frequency           Progress Toward Goals  OT Goals(current goals can now be found in the care plan section)  Progress towards OT goals: Progressing toward goals     Plan      Co-evaluation  AM-PAC OT "6 Clicks" Daily Activity     Outcome Measure   Help from another person eating meals?: None Help from another person taking care of personal grooming?: A Little Help from another person toileting, which includes using toliet, bedpan, or urinal?: A Little Help from another person bathing (including washing, rinsing, drying)?: A Lot Help from another person to put on and taking off regular upper body clothing?: A Little Help from another person to put on and taking off regular lower body clothing?: A Lot 6 Click Score: 17    End of Session    OT Visit Diagnosis: Pain Pain - Right/Left: Left Pain - part of body: Hip   Activity Tolerance Patient limited by fatigue;Patient limited by pain   Patient Left in chair;with call bell/phone within reach;with chair alarm set   Nurse Communication          Time: 1324-4010 OT Time Calculation (min): 22 min  Charges: OT General Charges $OT Visit: 1 Visit OT Treatments $Self Care/Home Management : 8-22 mins  Marica Otter, OTR/L Acute Rehabilitation Services [phone] [facility] pager [phone] office 04/22/2019   [name] 04/22/2019, 9:24 AM

## 2019-04-23 DIAGNOSIS — I483 Typical atrial flutter: Secondary | ICD-10-CM | POA: Diagnosis not present

## 2019-04-23 DIAGNOSIS — I25720 Atherosclerosis of autologous artery coronary artery bypass graft(s) with unstable angina pectoris: Secondary | ICD-10-CM | POA: Diagnosis not present

## 2019-04-23 DIAGNOSIS — E119 Type 2 diabetes mellitus without complications: Secondary | ICD-10-CM | POA: Diagnosis not present

## 2019-04-23 DIAGNOSIS — Z794 Long term (current) use of insulin: Secondary | ICD-10-CM | POA: Diagnosis not present

## 2019-04-23 DIAGNOSIS — I10 Essential (primary) hypertension: Secondary | ICD-10-CM | POA: Diagnosis not present

## 2019-04-23 DIAGNOSIS — S72012D Unspecified intracapsular fracture of left femur, subsequent encounter for closed fracture with routine healing: Secondary | ICD-10-CM | POA: Diagnosis not present

## 2019-04-23 DIAGNOSIS — Z96642 Presence of left artificial hip joint: Secondary | ICD-10-CM | POA: Diagnosis not present

## 2019-04-23 DIAGNOSIS — W1830XD Fall on same level, unspecified, subsequent encounter: Secondary | ICD-10-CM | POA: Diagnosis not present

## 2019-04-26 DIAGNOSIS — I483 Typical atrial flutter: Secondary | ICD-10-CM | POA: Diagnosis not present

## 2019-04-26 DIAGNOSIS — W1830XD Fall on same level, unspecified, subsequent encounter: Secondary | ICD-10-CM | POA: Diagnosis not present

## 2019-04-26 DIAGNOSIS — Z96642 Presence of left artificial hip joint: Secondary | ICD-10-CM | POA: Diagnosis not present

## 2019-04-26 DIAGNOSIS — E119 Type 2 diabetes mellitus without complications: Secondary | ICD-10-CM | POA: Diagnosis not present

## 2019-04-26 DIAGNOSIS — I25720 Atherosclerosis of autologous artery coronary artery bypass graft(s) with unstable angina pectoris: Secondary | ICD-10-CM | POA: Diagnosis not present

## 2019-04-26 DIAGNOSIS — S72012D Unspecified intracapsular fracture of left femur, subsequent encounter for closed fracture with routine healing: Secondary | ICD-10-CM | POA: Diagnosis not present

## 2019-04-28 DIAGNOSIS — I483 Typical atrial flutter: Secondary | ICD-10-CM | POA: Diagnosis not present

## 2019-04-28 DIAGNOSIS — E119 Type 2 diabetes mellitus without complications: Secondary | ICD-10-CM | POA: Diagnosis not present

## 2019-04-28 DIAGNOSIS — W1830XD Fall on same level, unspecified, subsequent encounter: Secondary | ICD-10-CM | POA: Diagnosis not present

## 2019-04-28 DIAGNOSIS — S72012D Unspecified intracapsular fracture of left femur, subsequent encounter for closed fracture with routine healing: Secondary | ICD-10-CM | POA: Diagnosis not present

## 2019-04-28 DIAGNOSIS — Z96642 Presence of left artificial hip joint: Secondary | ICD-10-CM | POA: Diagnosis not present

## 2019-04-28 DIAGNOSIS — I25720 Atherosclerosis of autologous artery coronary artery bypass graft(s) with unstable angina pectoris: Secondary | ICD-10-CM | POA: Diagnosis not present

## 2019-04-30 DIAGNOSIS — I483 Typical atrial flutter: Secondary | ICD-10-CM | POA: Diagnosis not present

## 2019-04-30 DIAGNOSIS — S72012D Unspecified intracapsular fracture of left femur, subsequent encounter for closed fracture with routine healing: Secondary | ICD-10-CM | POA: Diagnosis not present

## 2019-04-30 DIAGNOSIS — I25720 Atherosclerosis of autologous artery coronary artery bypass graft(s) with unstable angina pectoris: Secondary | ICD-10-CM | POA: Diagnosis not present

## 2019-04-30 DIAGNOSIS — Z96642 Presence of left artificial hip joint: Secondary | ICD-10-CM | POA: Diagnosis not present

## 2019-04-30 DIAGNOSIS — E119 Type 2 diabetes mellitus without complications: Secondary | ICD-10-CM | POA: Diagnosis not present

## 2019-04-30 DIAGNOSIS — W1830XD Fall on same level, unspecified, subsequent encounter: Secondary | ICD-10-CM | POA: Diagnosis not present

## 2019-05-03 DIAGNOSIS — S72012D Unspecified intracapsular fracture of left femur, subsequent encounter for closed fracture with routine healing: Secondary | ICD-10-CM | POA: Diagnosis not present

## 2019-05-03 DIAGNOSIS — I25720 Atherosclerosis of autologous artery coronary artery bypass graft(s) with unstable angina pectoris: Secondary | ICD-10-CM | POA: Diagnosis not present

## 2019-05-03 DIAGNOSIS — I483 Typical atrial flutter: Secondary | ICD-10-CM | POA: Diagnosis not present

## 2019-05-03 DIAGNOSIS — Z96642 Presence of left artificial hip joint: Secondary | ICD-10-CM | POA: Diagnosis not present

## 2019-05-03 DIAGNOSIS — E119 Type 2 diabetes mellitus without complications: Secondary | ICD-10-CM | POA: Diagnosis not present

## 2019-05-03 DIAGNOSIS — W1830XD Fall on same level, unspecified, subsequent encounter: Secondary | ICD-10-CM | POA: Diagnosis not present

## 2019-05-04 DIAGNOSIS — R269 Unspecified abnormalities of gait and mobility: Secondary | ICD-10-CM | POA: Diagnosis not present

## 2019-05-04 DIAGNOSIS — I10 Essential (primary) hypertension: Secondary | ICD-10-CM | POA: Diagnosis not present

## 2019-05-04 DIAGNOSIS — I480 Paroxysmal atrial fibrillation: Secondary | ICD-10-CM | POA: Diagnosis not present

## 2019-05-04 DIAGNOSIS — E11319 Type 2 diabetes mellitus with unspecified diabetic retinopathy without macular edema: Secondary | ICD-10-CM | POA: Diagnosis not present

## 2019-05-04 DIAGNOSIS — D62 Acute posthemorrhagic anemia: Secondary | ICD-10-CM | POA: Diagnosis not present

## 2019-05-05 DIAGNOSIS — I483 Typical atrial flutter: Secondary | ICD-10-CM | POA: Diagnosis not present

## 2019-05-05 DIAGNOSIS — I25720 Atherosclerosis of autologous artery coronary artery bypass graft(s) with unstable angina pectoris: Secondary | ICD-10-CM | POA: Diagnosis not present

## 2019-05-05 DIAGNOSIS — W1830XD Fall on same level, unspecified, subsequent encounter: Secondary | ICD-10-CM | POA: Diagnosis not present

## 2019-05-05 DIAGNOSIS — S72012D Unspecified intracapsular fracture of left femur, subsequent encounter for closed fracture with routine healing: Secondary | ICD-10-CM | POA: Diagnosis not present

## 2019-05-05 DIAGNOSIS — E119 Type 2 diabetes mellitus without complications: Secondary | ICD-10-CM | POA: Diagnosis not present

## 2019-05-05 DIAGNOSIS — Z96642 Presence of left artificial hip joint: Secondary | ICD-10-CM | POA: Diagnosis not present

## 2019-05-07 DIAGNOSIS — E119 Type 2 diabetes mellitus without complications: Secondary | ICD-10-CM | POA: Diagnosis not present

## 2019-05-07 DIAGNOSIS — I483 Typical atrial flutter: Secondary | ICD-10-CM | POA: Diagnosis not present

## 2019-05-07 DIAGNOSIS — I25720 Atherosclerosis of autologous artery coronary artery bypass graft(s) with unstable angina pectoris: Secondary | ICD-10-CM | POA: Diagnosis not present

## 2019-05-07 DIAGNOSIS — Z96642 Presence of left artificial hip joint: Secondary | ICD-10-CM | POA: Diagnosis not present

## 2019-05-07 DIAGNOSIS — W1830XD Fall on same level, unspecified, subsequent encounter: Secondary | ICD-10-CM | POA: Diagnosis not present

## 2019-05-07 DIAGNOSIS — S72012D Unspecified intracapsular fracture of left femur, subsequent encounter for closed fracture with routine healing: Secondary | ICD-10-CM | POA: Diagnosis not present

## 2019-05-10 DIAGNOSIS — Z96642 Presence of left artificial hip joint: Secondary | ICD-10-CM | POA: Diagnosis not present

## 2019-05-10 DIAGNOSIS — W1830XD Fall on same level, unspecified, subsequent encounter: Secondary | ICD-10-CM | POA: Diagnosis not present

## 2019-05-10 DIAGNOSIS — S72012D Unspecified intracapsular fracture of left femur, subsequent encounter for closed fracture with routine healing: Secondary | ICD-10-CM | POA: Diagnosis not present

## 2019-05-10 DIAGNOSIS — I483 Typical atrial flutter: Secondary | ICD-10-CM | POA: Diagnosis not present

## 2019-05-10 DIAGNOSIS — E119 Type 2 diabetes mellitus without complications: Secondary | ICD-10-CM | POA: Diagnosis not present

## 2019-05-10 DIAGNOSIS — I25720 Atherosclerosis of autologous artery coronary artery bypass graft(s) with unstable angina pectoris: Secondary | ICD-10-CM | POA: Diagnosis not present

## 2019-05-11 DIAGNOSIS — S72012D Unspecified intracapsular fracture of left femur, subsequent encounter for closed fracture with routine healing: Secondary | ICD-10-CM | POA: Diagnosis not present

## 2019-05-11 DIAGNOSIS — I25720 Atherosclerosis of autologous artery coronary artery bypass graft(s) with unstable angina pectoris: Secondary | ICD-10-CM | POA: Diagnosis not present

## 2019-05-11 DIAGNOSIS — W1830XD Fall on same level, unspecified, subsequent encounter: Secondary | ICD-10-CM | POA: Diagnosis not present

## 2019-05-11 DIAGNOSIS — E119 Type 2 diabetes mellitus without complications: Secondary | ICD-10-CM | POA: Diagnosis not present

## 2019-05-11 DIAGNOSIS — Z96642 Presence of left artificial hip joint: Secondary | ICD-10-CM | POA: Diagnosis not present

## 2019-05-11 DIAGNOSIS — I483 Typical atrial flutter: Secondary | ICD-10-CM | POA: Diagnosis not present

## 2019-05-12 DIAGNOSIS — W1830XD Fall on same level, unspecified, subsequent encounter: Secondary | ICD-10-CM | POA: Diagnosis not present

## 2019-05-12 DIAGNOSIS — I483 Typical atrial flutter: Secondary | ICD-10-CM | POA: Diagnosis not present

## 2019-05-12 DIAGNOSIS — Z96642 Presence of left artificial hip joint: Secondary | ICD-10-CM | POA: Diagnosis not present

## 2019-05-12 DIAGNOSIS — I25720 Atherosclerosis of autologous artery coronary artery bypass graft(s) with unstable angina pectoris: Secondary | ICD-10-CM | POA: Diagnosis not present

## 2019-05-12 DIAGNOSIS — S72012D Unspecified intracapsular fracture of left femur, subsequent encounter for closed fracture with routine healing: Secondary | ICD-10-CM | POA: Diagnosis not present

## 2019-05-12 DIAGNOSIS — E119 Type 2 diabetes mellitus without complications: Secondary | ICD-10-CM | POA: Diagnosis not present

## 2019-05-14 DIAGNOSIS — S72012D Unspecified intracapsular fracture of left femur, subsequent encounter for closed fracture with routine healing: Secondary | ICD-10-CM | POA: Diagnosis not present

## 2019-05-14 DIAGNOSIS — I483 Typical atrial flutter: Secondary | ICD-10-CM | POA: Diagnosis not present

## 2019-05-14 DIAGNOSIS — W1830XD Fall on same level, unspecified, subsequent encounter: Secondary | ICD-10-CM | POA: Diagnosis not present

## 2019-05-14 DIAGNOSIS — E119 Type 2 diabetes mellitus without complications: Secondary | ICD-10-CM | POA: Diagnosis not present

## 2019-05-14 DIAGNOSIS — I25720 Atherosclerosis of autologous artery coronary artery bypass graft(s) with unstable angina pectoris: Secondary | ICD-10-CM | POA: Diagnosis not present

## 2019-05-14 DIAGNOSIS — Z96642 Presence of left artificial hip joint: Secondary | ICD-10-CM | POA: Diagnosis not present

## 2019-05-18 DIAGNOSIS — Z96642 Presence of left artificial hip joint: Secondary | ICD-10-CM | POA: Diagnosis not present

## 2019-05-18 DIAGNOSIS — S72012D Unspecified intracapsular fracture of left femur, subsequent encounter for closed fracture with routine healing: Secondary | ICD-10-CM | POA: Diagnosis not present

## 2019-05-18 DIAGNOSIS — W1830XD Fall on same level, unspecified, subsequent encounter: Secondary | ICD-10-CM | POA: Diagnosis not present

## 2019-05-18 DIAGNOSIS — E119 Type 2 diabetes mellitus without complications: Secondary | ICD-10-CM | POA: Diagnosis not present

## 2019-05-18 DIAGNOSIS — I25720 Atherosclerosis of autologous artery coronary artery bypass graft(s) with unstable angina pectoris: Secondary | ICD-10-CM | POA: Diagnosis not present

## 2019-05-18 DIAGNOSIS — I483 Typical atrial flutter: Secondary | ICD-10-CM | POA: Diagnosis not present

## 2019-05-20 DIAGNOSIS — E119 Type 2 diabetes mellitus without complications: Secondary | ICD-10-CM | POA: Diagnosis not present

## 2019-05-20 DIAGNOSIS — W1830XD Fall on same level, unspecified, subsequent encounter: Secondary | ICD-10-CM | POA: Diagnosis not present

## 2019-05-20 DIAGNOSIS — I25720 Atherosclerosis of autologous artery coronary artery bypass graft(s) with unstable angina pectoris: Secondary | ICD-10-CM | POA: Diagnosis not present

## 2019-05-20 DIAGNOSIS — S72012D Unspecified intracapsular fracture of left femur, subsequent encounter for closed fracture with routine healing: Secondary | ICD-10-CM | POA: Diagnosis not present

## 2019-05-20 DIAGNOSIS — Z96642 Presence of left artificial hip joint: Secondary | ICD-10-CM | POA: Diagnosis not present

## 2019-05-20 DIAGNOSIS — I483 Typical atrial flutter: Secondary | ICD-10-CM | POA: Diagnosis not present

## 2019-05-21 DIAGNOSIS — I25720 Atherosclerosis of autologous artery coronary artery bypass graft(s) with unstable angina pectoris: Secondary | ICD-10-CM | POA: Diagnosis not present

## 2019-05-21 DIAGNOSIS — Z96642 Presence of left artificial hip joint: Secondary | ICD-10-CM | POA: Diagnosis not present

## 2019-05-21 DIAGNOSIS — E119 Type 2 diabetes mellitus without complications: Secondary | ICD-10-CM | POA: Diagnosis not present

## 2019-05-21 DIAGNOSIS — W1830XD Fall on same level, unspecified, subsequent encounter: Secondary | ICD-10-CM | POA: Diagnosis not present

## 2019-05-21 DIAGNOSIS — I483 Typical atrial flutter: Secondary | ICD-10-CM | POA: Diagnosis not present

## 2019-05-21 DIAGNOSIS — S72012D Unspecified intracapsular fracture of left femur, subsequent encounter for closed fracture with routine healing: Secondary | ICD-10-CM | POA: Diagnosis not present

## 2019-05-23 DIAGNOSIS — I483 Typical atrial flutter: Secondary | ICD-10-CM | POA: Diagnosis not present

## 2019-05-23 DIAGNOSIS — Z96642 Presence of left artificial hip joint: Secondary | ICD-10-CM | POA: Diagnosis not present

## 2019-05-23 DIAGNOSIS — Z794 Long term (current) use of insulin: Secondary | ICD-10-CM | POA: Diagnosis not present

## 2019-05-23 DIAGNOSIS — I10 Essential (primary) hypertension: Secondary | ICD-10-CM | POA: Diagnosis not present

## 2019-05-23 DIAGNOSIS — S72012D Unspecified intracapsular fracture of left femur, subsequent encounter for closed fracture with routine healing: Secondary | ICD-10-CM | POA: Diagnosis not present

## 2019-05-23 DIAGNOSIS — I25720 Atherosclerosis of autologous artery coronary artery bypass graft(s) with unstable angina pectoris: Secondary | ICD-10-CM | POA: Diagnosis not present

## 2019-05-23 DIAGNOSIS — W1830XD Fall on same level, unspecified, subsequent encounter: Secondary | ICD-10-CM | POA: Diagnosis not present

## 2019-05-23 DIAGNOSIS — E119 Type 2 diabetes mellitus without complications: Secondary | ICD-10-CM | POA: Diagnosis not present

## 2019-05-25 DIAGNOSIS — S72012D Unspecified intracapsular fracture of left femur, subsequent encounter for closed fracture with routine healing: Secondary | ICD-10-CM | POA: Diagnosis not present

## 2019-05-25 DIAGNOSIS — E119 Type 2 diabetes mellitus without complications: Secondary | ICD-10-CM | POA: Diagnosis not present

## 2019-05-25 DIAGNOSIS — Z96642 Presence of left artificial hip joint: Secondary | ICD-10-CM | POA: Diagnosis not present

## 2019-05-25 DIAGNOSIS — I25720 Atherosclerosis of autologous artery coronary artery bypass graft(s) with unstable angina pectoris: Secondary | ICD-10-CM | POA: Diagnosis not present

## 2019-05-25 DIAGNOSIS — W1830XD Fall on same level, unspecified, subsequent encounter: Secondary | ICD-10-CM | POA: Diagnosis not present

## 2019-05-25 DIAGNOSIS — I483 Typical atrial flutter: Secondary | ICD-10-CM | POA: Diagnosis not present

## 2019-05-26 DIAGNOSIS — W1830XD Fall on same level, unspecified, subsequent encounter: Secondary | ICD-10-CM | POA: Diagnosis not present

## 2019-05-26 DIAGNOSIS — I483 Typical atrial flutter: Secondary | ICD-10-CM | POA: Diagnosis not present

## 2019-05-26 DIAGNOSIS — I25720 Atherosclerosis of autologous artery coronary artery bypass graft(s) with unstable angina pectoris: Secondary | ICD-10-CM | POA: Diagnosis not present

## 2019-05-26 DIAGNOSIS — S72012D Unspecified intracapsular fracture of left femur, subsequent encounter for closed fracture with routine healing: Secondary | ICD-10-CM | POA: Diagnosis not present

## 2019-05-26 DIAGNOSIS — Z96642 Presence of left artificial hip joint: Secondary | ICD-10-CM | POA: Diagnosis not present

## 2019-05-26 DIAGNOSIS — E119 Type 2 diabetes mellitus without complications: Secondary | ICD-10-CM | POA: Diagnosis not present

## 2019-05-27 DIAGNOSIS — I25720 Atherosclerosis of autologous artery coronary artery bypass graft(s) with unstable angina pectoris: Secondary | ICD-10-CM | POA: Diagnosis not present

## 2019-05-27 DIAGNOSIS — Z96642 Presence of left artificial hip joint: Secondary | ICD-10-CM | POA: Diagnosis not present

## 2019-05-27 DIAGNOSIS — S72012D Unspecified intracapsular fracture of left femur, subsequent encounter for closed fracture with routine healing: Secondary | ICD-10-CM | POA: Diagnosis not present

## 2019-05-27 DIAGNOSIS — E119 Type 2 diabetes mellitus without complications: Secondary | ICD-10-CM | POA: Diagnosis not present

## 2019-05-27 DIAGNOSIS — W1830XD Fall on same level, unspecified, subsequent encounter: Secondary | ICD-10-CM | POA: Diagnosis not present

## 2019-05-27 DIAGNOSIS — I483 Typical atrial flutter: Secondary | ICD-10-CM | POA: Diagnosis not present

## 2019-05-31 DIAGNOSIS — Z96642 Presence of left artificial hip joint: Secondary | ICD-10-CM | POA: Diagnosis not present

## 2019-05-31 DIAGNOSIS — W1830XD Fall on same level, unspecified, subsequent encounter: Secondary | ICD-10-CM | POA: Diagnosis not present

## 2019-05-31 DIAGNOSIS — S72012D Unspecified intracapsular fracture of left femur, subsequent encounter for closed fracture with routine healing: Secondary | ICD-10-CM | POA: Diagnosis not present

## 2019-05-31 DIAGNOSIS — E119 Type 2 diabetes mellitus without complications: Secondary | ICD-10-CM | POA: Diagnosis not present

## 2019-05-31 DIAGNOSIS — I25720 Atherosclerosis of autologous artery coronary artery bypass graft(s) with unstable angina pectoris: Secondary | ICD-10-CM | POA: Diagnosis not present

## 2019-05-31 DIAGNOSIS — I483 Typical atrial flutter: Secondary | ICD-10-CM | POA: Diagnosis not present

## 2019-06-01 DIAGNOSIS — Z96642 Presence of left artificial hip joint: Secondary | ICD-10-CM | POA: Diagnosis not present

## 2019-06-01 DIAGNOSIS — Z471 Aftercare following joint replacement surgery: Secondary | ICD-10-CM | POA: Diagnosis not present

## 2019-06-02 DIAGNOSIS — I483 Typical atrial flutter: Secondary | ICD-10-CM | POA: Diagnosis not present

## 2019-06-02 DIAGNOSIS — E119 Type 2 diabetes mellitus without complications: Secondary | ICD-10-CM | POA: Diagnosis not present

## 2019-06-02 DIAGNOSIS — W1830XD Fall on same level, unspecified, subsequent encounter: Secondary | ICD-10-CM | POA: Diagnosis not present

## 2019-06-02 DIAGNOSIS — I25720 Atherosclerosis of autologous artery coronary artery bypass graft(s) with unstable angina pectoris: Secondary | ICD-10-CM | POA: Diagnosis not present

## 2019-06-02 DIAGNOSIS — Z96642 Presence of left artificial hip joint: Secondary | ICD-10-CM | POA: Diagnosis not present

## 2019-06-02 DIAGNOSIS — S72012D Unspecified intracapsular fracture of left femur, subsequent encounter for closed fracture with routine healing: Secondary | ICD-10-CM | POA: Diagnosis not present

## 2019-06-08 DIAGNOSIS — I25720 Atherosclerosis of autologous artery coronary artery bypass graft(s) with unstable angina pectoris: Secondary | ICD-10-CM | POA: Diagnosis not present

## 2019-06-08 DIAGNOSIS — S72012D Unspecified intracapsular fracture of left femur, subsequent encounter for closed fracture with routine healing: Secondary | ICD-10-CM | POA: Diagnosis not present

## 2019-06-08 DIAGNOSIS — W1830XD Fall on same level, unspecified, subsequent encounter: Secondary | ICD-10-CM | POA: Diagnosis not present

## 2019-06-08 DIAGNOSIS — Z96642 Presence of left artificial hip joint: Secondary | ICD-10-CM | POA: Diagnosis not present

## 2019-06-08 DIAGNOSIS — E119 Type 2 diabetes mellitus without complications: Secondary | ICD-10-CM | POA: Diagnosis not present

## 2019-06-08 DIAGNOSIS — I483 Typical atrial flutter: Secondary | ICD-10-CM | POA: Diagnosis not present

## 2019-06-15 DIAGNOSIS — S72012D Unspecified intracapsular fracture of left femur, subsequent encounter for closed fracture with routine healing: Secondary | ICD-10-CM | POA: Diagnosis not present

## 2019-06-15 DIAGNOSIS — E119 Type 2 diabetes mellitus without complications: Secondary | ICD-10-CM | POA: Diagnosis not present

## 2019-06-15 DIAGNOSIS — I483 Typical atrial flutter: Secondary | ICD-10-CM | POA: Diagnosis not present

## 2019-06-15 DIAGNOSIS — W1830XD Fall on same level, unspecified, subsequent encounter: Secondary | ICD-10-CM | POA: Diagnosis not present

## 2019-06-15 DIAGNOSIS — I25720 Atherosclerosis of autologous artery coronary artery bypass graft(s) with unstable angina pectoris: Secondary | ICD-10-CM | POA: Diagnosis not present

## 2019-06-15 DIAGNOSIS — Z96642 Presence of left artificial hip joint: Secondary | ICD-10-CM | POA: Diagnosis not present

## 2019-06-17 DIAGNOSIS — I25720 Atherosclerosis of autologous artery coronary artery bypass graft(s) with unstable angina pectoris: Secondary | ICD-10-CM | POA: Diagnosis not present

## 2019-06-17 DIAGNOSIS — Z96642 Presence of left artificial hip joint: Secondary | ICD-10-CM | POA: Diagnosis not present

## 2019-06-17 DIAGNOSIS — S72012D Unspecified intracapsular fracture of left femur, subsequent encounter for closed fracture with routine healing: Secondary | ICD-10-CM | POA: Diagnosis not present

## 2019-06-17 DIAGNOSIS — W1830XD Fall on same level, unspecified, subsequent encounter: Secondary | ICD-10-CM | POA: Diagnosis not present

## 2019-06-17 DIAGNOSIS — E119 Type 2 diabetes mellitus without complications: Secondary | ICD-10-CM | POA: Diagnosis not present

## 2019-06-17 DIAGNOSIS — I483 Typical atrial flutter: Secondary | ICD-10-CM | POA: Diagnosis not present

## 2019-06-18 DIAGNOSIS — I483 Typical atrial flutter: Secondary | ICD-10-CM | POA: Diagnosis not present

## 2019-06-18 DIAGNOSIS — Z96642 Presence of left artificial hip joint: Secondary | ICD-10-CM | POA: Diagnosis not present

## 2019-06-18 DIAGNOSIS — E119 Type 2 diabetes mellitus without complications: Secondary | ICD-10-CM | POA: Diagnosis not present

## 2019-06-18 DIAGNOSIS — W1830XD Fall on same level, unspecified, subsequent encounter: Secondary | ICD-10-CM | POA: Diagnosis not present

## 2019-06-18 DIAGNOSIS — S72012D Unspecified intracapsular fracture of left femur, subsequent encounter for closed fracture with routine healing: Secondary | ICD-10-CM | POA: Diagnosis not present

## 2019-06-18 DIAGNOSIS — I25720 Atherosclerosis of autologous artery coronary artery bypass graft(s) with unstable angina pectoris: Secondary | ICD-10-CM | POA: Diagnosis not present

## 2019-06-22 ENCOUNTER — Other Ambulatory Visit: Payer: Self-pay | Admitting: Geriatric Medicine

## 2019-06-22 DIAGNOSIS — S72002A Fracture of unspecified part of neck of left femur, initial encounter for closed fracture: Secondary | ICD-10-CM

## 2019-06-28 ENCOUNTER — Other Ambulatory Visit: Payer: Self-pay | Admitting: Interventional Cardiology

## 2019-07-06 DIAGNOSIS — K5900 Constipation, unspecified: Secondary | ICD-10-CM | POA: Diagnosis not present

## 2019-07-06 DIAGNOSIS — I2510 Atherosclerotic heart disease of native coronary artery without angina pectoris: Secondary | ICD-10-CM | POA: Diagnosis not present

## 2019-07-06 DIAGNOSIS — Z8639 Personal history of other endocrine, nutritional and metabolic disease: Secondary | ICD-10-CM | POA: Diagnosis not present

## 2019-07-06 DIAGNOSIS — Z794 Long term (current) use of insulin: Secondary | ICD-10-CM | POA: Diagnosis not present

## 2019-07-06 DIAGNOSIS — E11319 Type 2 diabetes mellitus with unspecified diabetic retinopathy without macular edema: Secondary | ICD-10-CM | POA: Diagnosis not present

## 2019-07-20 NOTE — Progress Notes (Signed)
Virtual Visit via Telephone Note The purpose of this virtual visit is to provide medical care while limiting exposure to the novel coronavirus.    Consent was obtained for phone visit:  Yes.   Answered questions that patient had about telehealth interaction:  Yes.   I discussed the limitations, risks, security and privacy concerns of performing an evaluation and management service by telephone. I also discussed with the patient that there may be a patient responsible charge related to this service. The patient expressed understanding and agreed to proceed.  Pt location: Home Physician Location: office Name of referring provider:  [name], MD I connected with .[name] at patients initiation/request on 07/21/2019 at  2:30 PM EDT by telephone and verified that I am speaking with the correct person using two identifiers.  Pt MRN:  [id number] Pt DOB:  06-24-1934   History of Present Illness:  [name] an 85 year old left-handed female withatrial flutter, coronary artery disease, type 2 diabetes mellitus, hypertension, high cholesterol, and history of stroke follows up for complicated migraine.  UPDATE: Following last visit in January, she had left leg numbness in February and right leg numbness in March, lasting seconds to a minute.  No spells since then.  Still uncertain if related to the metformin.    In April, she had an episode of vertigo and fell.  She was confused afterwards.  CT/CTA of head was negative for acute intracranial abnormality or significant arterial stenosis.  EEG was normal.  Another complicated migraine was negative.  No recent headaches.   Current medications:   Current pain reliever:  Ultracet (helps with headache but not recently needed) Current blood thinner:  Pradaxa Current statin:  Crestor 10mg  Current diabetic medications:  Lantus, Victoza Current antihypertensive:  Coreg Current anxiolytic:  Klonopin 0.5 mg PRN for sleep Current  sleep aid:  Klonopin, trazadone  HISTORY: She presented to the ED on 11/15/2018 for stroke-like symptoms. She had trouble saying words that she was thinking (not word-finding difficulty)and slurred speech. She was also confused, unable to figure out how to work the washing machine or use her glucometer.She also noted mild right-sided headache. She did not have any other focal or lateralizing symptoms such as unilateral numbness or weakness. Symptoms lasted several hours.EKG showed sinus rhythm. CT of the head was personally reviewed and revealed no acute abnormalities. MRI of the brain was personally reviewed and demonstrated known old left cerebellar infarct as well as other chronic small vessel ischemic changes but no acute stroke. CTA of the head and neck was personally reviewed and showed no emergent large vessel occlusion or high-grade stenosis of the intracranial and extracranial arteries. Other than an elevated serum glucose of 214, labs such as CBC, CMP, UA, urine rapid drug screen, and ethanol were negative. Since then, she has been more weak and sleepy. She can barely get up out of her chair. She is not as ambulatory.  Following last office visit in November 2019, she developed recurrence of vertigo, nausea and head fullness as well as intermittent speech disturbance unilateral left sided numbness and weakness.   Symptoms lasted several hours.  She was switched from metformin to Victoza in early January 2020 and she has not had a recurrent headache since then.  She has prior history of stroke. In 2012, she had a cerebellar stroke secondary to atrial flutter. In 2015 she had episode of word-finding difficulty and right sided head numbness and confusion (unable to enter her computer password). MRI of  brain showed no acute stroke. She was diagnosed with complicated migraine. She does have remote history of frequent common migraines in her 45s and 30s.      Observations/Objective:   Height 5\' 2"  (1.575 m), weight 174 lb (78.9 kg). No acute distress.  Alert and oriented.  Speech fluent and not dysarthric.  Language intact.     Assessment and Plan:   1.  Complicated migraine 2.  Hypertension 3.  Hyperlipidemia 4.  Type 2 diabetes mellitus 5.  CAD 6.  Atrial flutter   2.  Continue secondary stroke prevention:  Pradaxa, statin therapy, blood pressure control.  Optimize glycemic control 3.  Follow up in 6 months   Follow Up Instructions:    -I discussed the assessment and treatment plan with the patient. The patient was provided an opportunity to ask questions and all were answered. The patient agreed with the plan and demonstrated an understanding of the instructions.   The patient was advised to call back or seek an in-person evaluation if the symptoms worsen or if the condition fails to improve as anticipated.    Total Time spent in visit with the patient was:  14 minutes   Cira Servant, DO

## 2019-07-21 ENCOUNTER — Other Ambulatory Visit: Payer: Self-pay

## 2019-07-21 ENCOUNTER — Encounter: Payer: Self-pay | Admitting: Neurology

## 2019-07-21 ENCOUNTER — Telehealth (INDEPENDENT_AMBULATORY_CARE_PROVIDER_SITE_OTHER): Payer: Medicare Other | Admitting: Neurology

## 2019-07-21 VITALS — Ht 62.0 in | Wt 174.0 lb

## 2019-07-21 DIAGNOSIS — G43109 Migraine with aura, not intractable, without status migrainosus: Secondary | ICD-10-CM

## 2019-07-21 DIAGNOSIS — E785 Hyperlipidemia, unspecified: Secondary | ICD-10-CM

## 2019-07-21 DIAGNOSIS — E119 Type 2 diabetes mellitus without complications: Secondary | ICD-10-CM

## 2019-07-21 DIAGNOSIS — I25709 Atherosclerosis of coronary artery bypass graft(s), unspecified, with unspecified angina pectoris: Secondary | ICD-10-CM

## 2019-07-21 DIAGNOSIS — I10 Essential (primary) hypertension: Secondary | ICD-10-CM

## 2019-08-09 ENCOUNTER — Other Ambulatory Visit: Payer: Self-pay | Admitting: Geriatric Medicine

## 2019-08-09 DIAGNOSIS — S72002A Fracture of unspecified part of neck of left femur, initial encounter for closed fracture: Secondary | ICD-10-CM

## 2019-08-09 DIAGNOSIS — E2839 Other primary ovarian failure: Secondary | ICD-10-CM

## 2019-08-20 DIAGNOSIS — I2510 Atherosclerotic heart disease of native coronary artery without angina pectoris: Secondary | ICD-10-CM | POA: Diagnosis not present

## 2019-08-20 DIAGNOSIS — E11319 Type 2 diabetes mellitus with unspecified diabetic retinopathy without macular edema: Secondary | ICD-10-CM | POA: Diagnosis not present

## 2019-08-20 DIAGNOSIS — Z8639 Personal history of other endocrine, nutritional and metabolic disease: Secondary | ICD-10-CM | POA: Diagnosis not present

## 2019-08-20 DIAGNOSIS — Z794 Long term (current) use of insulin: Secondary | ICD-10-CM | POA: Diagnosis not present

## 2019-08-20 DIAGNOSIS — K5900 Constipation, unspecified: Secondary | ICD-10-CM | POA: Diagnosis not present

## 2019-09-30 DIAGNOSIS — Z961 Presence of intraocular lens: Secondary | ICD-10-CM | POA: Diagnosis not present

## 2019-09-30 DIAGNOSIS — E113293 Type 2 diabetes mellitus with mild nonproliferative diabetic retinopathy without macular edema, bilateral: Secondary | ICD-10-CM | POA: Diagnosis not present

## 2019-09-30 DIAGNOSIS — H04123 Dry eye syndrome of bilateral lacrimal glands: Secondary | ICD-10-CM | POA: Diagnosis not present

## 2019-09-30 DIAGNOSIS — H524 Presbyopia: Secondary | ICD-10-CM | POA: Diagnosis not present

## 2019-10-03 ENCOUNTER — Other Ambulatory Visit: Payer: Self-pay | Admitting: Interventional Cardiology

## 2019-10-04 ENCOUNTER — Other Ambulatory Visit: Payer: Self-pay | Admitting: Interventional Cardiology

## 2019-10-06 ENCOUNTER — Ambulatory Visit: Payer: Medicare Other | Admitting: Interventional Cardiology

## 2019-10-12 NOTE — Progress Notes (Signed)
Cardiology Office Note:    Date:  10/13/2019   ID:  [name], DOB [birth year], MRN [id number]  PCP:  April Laughter, MD  Cardiologist:  April Noe, MD   Referring MD: April Laughter, MD   Chief Complaint  Patient presents with   Atrial Fibrillation   Coronary Artery Disease   Congestive Heart Failure    History of Present Illness:    [name] is a 85 y.o. [name] with a hx of coronary artery disease status post CABG in 2006, paroxysmal atrial fibrillation on chronic anticoagulation with dabigatran, rheumatoid arthritis, diabetes, hypertension, prior stroke. Recently went to the emergency room in November 2019 with speech difficulty and transient confusion.  CT and MRI were both unremarkable for acute findings.  Her symptoms eventually resolved.  She did follow-up with neurology.  Dr. Everlena [name] felt that her symptoms could likely be secondary to a complicated migraine.  Fractured her hip in April after falling.  She is having palpitations that occur predominantly after she eats.  She does not get short of breath, lightheaded, dizzy, or other complaints.  She denies orthopnea, PND, and peripheral edema.  She is not having angina or nitroglycerin use.  She has occasional lapses in memory associated with confusion.  This occurred in December 2019 and the question of whether Pradaxa should be switched to another anticoagulant was raised.  We have continued Pradaxa without change.  Past Medical History:  Diagnosis Date   Anginal pain (HCC)    "saw doctor-think it was esophagus spasm"   Arthritis    Atrial flutter (HCC) 11/02/2013   Cataract    left eye   Coronary artery disease    Diabetes mellitus without complication (HCC)    type 2   Essential hypertension, benign 11/02/2013   H/O jaundice    as child   Headache 2015   complicated migraine x2    Hearing trouble    Heart disease    Hepatitis    hx of hepatitis A as child   High cholesterol    under control   Long term (current) use of anticoagulants 11/02/2013   Pradaxa    Measles    as child   Mouth dryness    Mumps    as teenager   Numbness and tingling    lips   Osteoporosis    "unsure-maybe in knees"   Psoriasis    Psoriatic arthritis (HCC)    Stroke (HCC) 2012   Vertigo    being monitored, occasional    Past Surgical History:  Procedure Laterality Date   APPENDECTOMY  as child   CARDIAC CATHETERIZATION  ~2006   clear   CORONARY ARTERY BYPASS GRAFT  02/16/2005   LUMBAR DISC SURGERY  09/2004   L1-L5   TONSILLECTOMY  as child   with adenoids   TOTAL HIP ARTHROPLASTY Left 04/19/2019   Procedure: TOTAL HIP ARTHROPLASTY ANTERIOR APPROACH;  Surgeon: April Gross, MD;  Location: [facility];  Service: Orthopedics;  Laterality: Left;   TOTAL KNEE ARTHROPLASTY Left 09/11/2015   Procedure: LEFT TOTAL KNEE ARTHROPLASTY;  Surgeon: April Gross, MD;  Location: [facility];  Service: Orthopedics;  Laterality: Left;    Current Medications: Current Meds  Medication Sig   acetaminophen (TYLENOL) 500 MG tablet Take 500 mg by mouth every 6 (six) hours as needed for mild pain.   calcium elemental as carbonate (TUMS ULTRA 1000) 400 MG chewable tablet Chew 1,000 mg by mouth every evening.   carvedilol (COREG)  25 MG tablet Take 1 tablet (25 mg total) by mouth 2 (two) times daily with a meal.   cholecalciferol (VITAMIN D) 1000 units tablet Take 2,000 Units by mouth daily.   clobetasol cream (TEMOVATE) 0.05 % Apply 1 application topically 2 (two) times daily as needed (skin irritation).   clonazePAM (KLONOPIN) 0.5 MG tablet Take 0.5 mg by mouth at bedtime as needed (for sleep).    diclofenac sodium (VOLTAREN) 1 % GEL Apply 2 g topically 4 (four) times daily as needed (pain).   LANTUS SOLOSTAR 100 UNIT/ML Solostar Pen Inject 64 Units into the skin every morning.    Loratadine (CLARITIN PO) Take 1 tablet by mouth daily.   methocarbamol (ROBAXIN) 500 MG tablet Take  1 tablet (500 mg total) by mouth every 6 (six) hours as needed for muscle spasms.   nitroGLYCERIN (NITROSTAT) 0.4 MG SL tablet Place 1 tablet (0.4 mg total) under the tongue every 5 (five) minutes x 3 doses as needed for chest pain.   nystatin-triamcinolone ointment (MYCOLOG) Apply 1 application topically as directed.    PRADAXA 150 MG CAPS Take 150 mg by mouth 2 (two) times daily.    Probiotic Product (ALIGN PO) Take 1 capsule by mouth daily.   ramipril (ALTACE) 5 MG capsule TAKE (1) CAPSULE TWICE DAILY.   rosuvastatin (CRESTOR) 10 MG tablet Take 10 mg by mouth at bedtime.    traZODone (DESYREL) 50 MG tablet Take 25 mg by mouth at bedtime.   VICTOZA 18 MG/3ML SOPN Inject 1.2 mg into the skin daily.    vitamin B-12 (CYANOCOBALAMIN) 250 MCG tablet Take 250 mcg by mouth daily.   [DISCONTINUED] carvedilol (COREG) 25 MG tablet TAKE (1) TABLET TWICE A DAY WITH FOOD.   [DISCONTINUED] nitroGLYCERIN (NITROSTAT) 0.4 MG SL tablet Place 0.4 mg under the tongue every 5 (five) minutes x 3 doses as needed for chest pain.   [DISCONTINUED] ramipril (ALTACE) 5 MG capsule TAKE (1) CAPSULE TWICE DAILY.     Allergies:   Codeine, Penicillins, Atenolol, Atorvastatin, Metoprolol, and Zetia [ezetimibe]   Social History   Socioeconomic History   Marital status: Married    Spouse name: [name]   Number of children: 3   Years of education: 16   Highest education level: Bachelor's degree (e.g., BA, AB, BS)  Occupational History   Occupation: Retired  Ecologist strain: Not on file   Food insecurity    Worry: Not on file    Inability: Not on Occupational hygienist needs    Medical: Not on file    Non-medical: Not on file  Tobacco Use   Smoking status: Never Smoker   Smokeless tobacco: Never Used  Substance and Sexual Activity   Alcohol use: No    Alcohol/week: 0.0 standard drinks   Drug use: No   Sexual activity: Not on file  Lifestyle   Physical activity      Days per week: Not on file    Minutes per session: Not on file   Stress: Not on file  Relationships   Social connections    Talks on phone: Not on file    Gets together: Not on file    Attends religious service: Not on file    Active member of club or organization: Not on file    Attends meetings of clubs or organizations: Not on file    Relationship status: Not on file  Other Topics Concern   Not on file  Social History Narrative  Patient is married with 3 children.   Patient is left handed.   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   Patient drinks 3 cups daily.     Family History: The patient's family history includes Heart failure in her mother; Neuromuscular disorder in her father.  ROS:   Please see the history of present illness.    Metformin is been discontinued.  There was concern that it was causing headaches.  Hemoglobin A1c target is 8.  She is on preventive therapy for left ventricular function and blood pressure in the form of beta-blocker and angiotensin converting enzyme inhibitor therapy.  All other systems reviewed and are negative.  EKGs/Labs/Other Studies Reviewed:    The following studies were reviewed today: No new data  EKG:  EKG a new tracing is not performed.  The most recent EKG was performed April 17, 2019 and demonstrates this tachycardia and new left bundle branch block.  Recent Labs: 11/15/2018: ALT 16 04/21/2019: BUN 26; Creatinine, Ser 0.61; Potassium 4.2; Sodium 136 04/22/2019: Hemoglobin 8.9; Platelets 127  Recent Lipid Panel    Component Value Date/Time   CHOL 137 08/05/2014 0453   TRIG 105 08/05/2014 0453   HDL 65 08/05/2014 0453   CHOLHDL 2.1 08/05/2014 0453   VLDL 21 08/05/2014 0453   LDLCALC 51 08/05/2014 0453    Physical Exam:    VS:  BP (!) 104/52    Pulse 79    Ht 5\' 2"  (1.575 m)    Wt 182 lb (82.6 kg)    SpO2 98%    BMI 33.29 kg/m     Wt Readings from Last 3 Encounters:  10/13/19 182 lb (82.6 kg)   07/21/19 174 lb (78.9 kg)  04/18/19 180 lb 8.9 oz (81.9 kg)     GEN: Pale and obese.. No acute distress HEENT: Normal NECK: No JVD. LYMPHATICS: No lymphadenopathy CARDIAC:  RRR without murmur, gallop, or edema. VASCULAR:  Normal Pulses. No bruits. RESPIRATORY:  Clear to auscultation without rales, wheezing or rhonchi  ABDOMEN: Soft, non-tender, non-distended, No pulsatile mass, MUSCULOSKELETAL: No deformity  SKIN: Warm and dry NEUROLOGIC:  Alert and oriented x 3 PSYCHIATRIC:  Normal affect   ASSESSMENT:    1. Paroxysmal atrial fibrillation (HCC)   2. Essential hypertension   3. Coronary artery disease involving coronary bypass graft of native heart with angina pectoris (HCC)   4. Long term current use of anticoagulant therapy   5. Left bundle branch block   6. Educated about COVID-19 virus infection    PLAN:    In order of problems listed above:  1. Rhythm is currently very regular.  The most recent tracing done on the dated she fractured a hip reveals sinus tachycardia with left bundle.  Tracing is not been repeated since that time. 2. Target blood pressure 140/80 mmHg or less. 3. Secondary prevention discussed 4. Continue Pradaxa for stroke prophylaxis 5. An EKG needs to be done at her next appointment to reassess whether left bundle is still present.  Suspect it could have been rate related in the setting of hip fracture in April 2020. 6. Social distancing, mask wearing, and handwashing is advocated and endorsed by the patient.   Medication Adjustments/Labs and Tests Ordered: Current medicines are reviewed at length with the patient today.  Concerns regarding medicines are outlined above.  Orders Placed This Encounter  Procedures   Basic metabolic panel   CBC   Meds ordered this encounter  Medications   carvedilol (COREG)  25 MG tablet    Sig: Take 1 tablet (25 mg total) by mouth 2 (two) times daily with a meal.    Dispense:  60 tablet    Refill:  11    nitroGLYCERIN (NITROSTAT) 0.4 MG SL tablet    Sig: Place 1 tablet (0.4 mg total) under the tongue every 5 (five) minutes x 3 doses as needed for chest pain.    Dispense:  25 tablet    Refill:  3   ramipril (ALTACE) 5 MG capsule    Sig: TAKE (1) CAPSULE TWICE DAILY.    Dispense:  60 capsule    Refill:  11    Patient Instructions  Medication Instructions:  Your physician recommends that you continue on your current medications as directed. Please refer to the Current Medication list given to you today.  If you need a refill on your cardiac medications before your next appointment, please call your pharmacy.   Lab work: BMET and CBC today  If you have labs (blood work) drawn today and your tests are completely normal, you will receive your results only by:  MyChart Message (if you have MyChart) OR  A paper copy in the mail If you have any lab test that is abnormal or we need to change your treatment, we will call you to review the results.  Testing/Procedures: None  Follow-Up: At [facility], you and your health needs are our priority.  As part of our continuing mission to provide you with exceptional heart care, we have created designated Provider Care Teams.  These Care Teams include your primary Cardiologist (physician) and Advanced Practice Providers (APPs -  Physician Assistants and Nurse Practitioners) who all work together to provide you with the care you need, when you need it. You will need a follow up appointment in 12 months.  Please call our office 2 months in advance to schedule this appointment.  You may see April Noe, MD or one of the following Advanced Practice Providers on your designated Care Team:   Norma Fredrickson, NP Nada Boozer, NP  Georgie Chard, NP  Any Other Special Instructions Will Be Listed Below (If Applicable).       Signed, April Noe, MD  10/13/2019 12:05 PM    [facility]

## 2019-10-13 ENCOUNTER — Encounter: Payer: Self-pay | Admitting: Interventional Cardiology

## 2019-10-13 ENCOUNTER — Ambulatory Visit (INDEPENDENT_AMBULATORY_CARE_PROVIDER_SITE_OTHER): Payer: Medicare Other | Admitting: Interventional Cardiology

## 2019-10-13 ENCOUNTER — Encounter (INDEPENDENT_AMBULATORY_CARE_PROVIDER_SITE_OTHER): Payer: Self-pay

## 2019-10-13 ENCOUNTER — Other Ambulatory Visit: Payer: Self-pay

## 2019-10-13 VITALS — BP 104/52 | HR 79 | Ht 62.0 in | Wt 182.0 lb

## 2019-10-13 DIAGNOSIS — Z7901 Long term (current) use of anticoagulants: Secondary | ICD-10-CM

## 2019-10-13 DIAGNOSIS — Z23 Encounter for immunization: Secondary | ICD-10-CM

## 2019-10-13 DIAGNOSIS — I480 Paroxysmal atrial fibrillation: Secondary | ICD-10-CM | POA: Diagnosis not present

## 2019-10-13 DIAGNOSIS — I25709 Atherosclerosis of coronary artery bypass graft(s), unspecified, with unspecified angina pectoris: Secondary | ICD-10-CM | POA: Diagnosis not present

## 2019-10-13 DIAGNOSIS — Z7189 Other specified counseling: Secondary | ICD-10-CM | POA: Diagnosis not present

## 2019-10-13 DIAGNOSIS — I447 Left bundle-branch block, unspecified: Secondary | ICD-10-CM

## 2019-10-13 DIAGNOSIS — I10 Essential (primary) hypertension: Secondary | ICD-10-CM | POA: Diagnosis not present

## 2019-10-13 LAB — CBC
Hematocrit: 39.7 % (ref 34.0–46.6)
Hemoglobin: 13.5 g/dL (ref 11.1–15.9)
MCH: 29.5 pg (ref 26.6–33.0)
MCHC: 34 g/dL (ref 31.5–35.7)
MCV: 87 fL (ref 79–97)
Platelets: 171 10*3/uL (ref 150–450)
RBC: 4.58 x10E6/uL (ref 3.77–5.28)
RDW: 12.4 % (ref 11.7–15.4)
WBC: 7.1 10*3/uL (ref 3.4–10.8)

## 2019-10-13 LAB — BASIC METABOLIC PANEL
BUN/Creatinine Ratio: 21 (ref 12–28)
BUN: 18 mg/dL (ref 8–27)
CO2: 20 mmol/L (ref 20–29)
Calcium: 9.3 mg/dL (ref 8.7–10.3)
Chloride: 104 mmol/L (ref 96–106)
Creatinine, Ser: 0.87 mg/dL (ref 0.57–1.00)
GFR calc Af Amer: 71 mL/min/{1.73_m2} (ref >59)
GFR calc non Af Amer: 62 mL/min/{1.73_m2} (ref >59)
Glucose: 248 mg/dL — ABNORMAL HIGH (ref 65–99)
Potassium: 4.5 mmol/L (ref 3.5–5.2)
Sodium: 138 mmol/L (ref 134–144)

## 2019-10-13 MED ORDER — CARVEDILOL 25 MG PO TABS: 25.0000 mg | ORAL_TABLET | Freq: Two times a day (BID) | ORAL | 11 refills | Status: DC

## 2019-10-13 MED ORDER — RAMIPRIL 5 MG PO CAPS: ORAL_CAPSULE | ORAL | 11 refills | Status: DC

## 2019-10-13 MED ORDER — NITROGLYCERIN 0.4 MG SL SUBL: 0.4000 mg | SUBLINGUAL_TABLET | SUBLINGUAL | 3 refills | Status: DC | PRN

## 2019-10-13 NOTE — Patient Instructions (Signed)
Medication Instructions:  Your physician recommends that you continue on your current medications as directed. Please refer to the Current Medication list given to you today.  If you need a refill on your cardiac medications before your next appointment, please call your pharmacy.   Lab work: BMET and CBC today  If you have labs (blood work) drawn today and your tests are completely normal, you will receive your results only by: . MyChart Message (if you have MyChart) OR . A paper copy in the mail If you have any lab test that is abnormal or we need to change your treatment, we will call you to review the results.  Testing/Procedures: None  Follow-Up: At [facility], you and your health needs are our priority.  As part of our continuing mission to provide you with exceptional heart care, we have created designated Provider Care Teams.  These Care Teams include your primary Cardiologist (physician) and Advanced Practice Providers (APPs -  Physician Assistants and Nurse Practitioners) who all work together to provide you with the care you need, when you need it. You will need a follow up appointment in 12 months.  Please call our office 2 months in advance to schedule this appointment.  You may see Henry W Smith III, MD or one of the following Advanced Practice Providers on your designated Care Team:   Lori Gerhardt, NP Laura Ingold, NP . Jill McDaniel, NP  Any Other Special Instructions Will Be Listed Below (If Applicable).    

## 2019-10-15 ENCOUNTER — Telehealth: Payer: Self-pay | Admitting: *Deleted

## 2019-10-15 ENCOUNTER — Encounter: Payer: Self-pay | Admitting: *Deleted

## 2019-10-15 NOTE — Telephone Encounter (Signed)
-----   Message from Belva Crome, MD sent at 10/14/2019  5:57 PM EDT ----- Let the patient know the labs are okay. A copy will be sent to Lajean Manes, MD

## 2019-10-20 ENCOUNTER — Other Ambulatory Visit: Payer: Medicare Other

## 2019-10-31 IMAGING — MR MR LUMBAR SPINE WO/W CM
4 of 7 series · 26 of 48 positions shown · IV contrast (multihance)
Comparison: Lumbar MRI 12/03/2013

CLINICAL DATA: Lumbar spondylolisthesis. Spinal stenosis with
bilateral leg pain. Prior back surgery

EXAM:
MRI LUMBAR SPINE WITHOUT AND WITH CONTRAST
TECHNIQUE: Multiplanar and multiecho pulse sequences of the lumbar spine were
obtained without and with intravenous contrast.
CONTRAST:  17mL MULTIHANCE GADOBENATE DIMEGLUMINE 529 MG/ML IV SOLN

[Series 3: T1 · sagittal · 4.0mm · 0.47mm/px · 3 of 12 slices shown (1 of 2)]
[im 1/12]
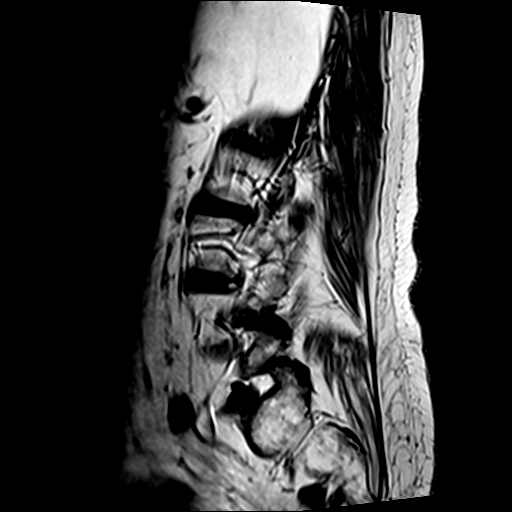
[im 6/12]
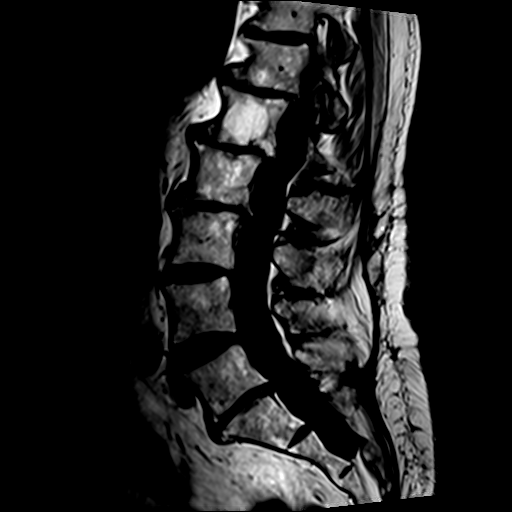
[im 12/12]
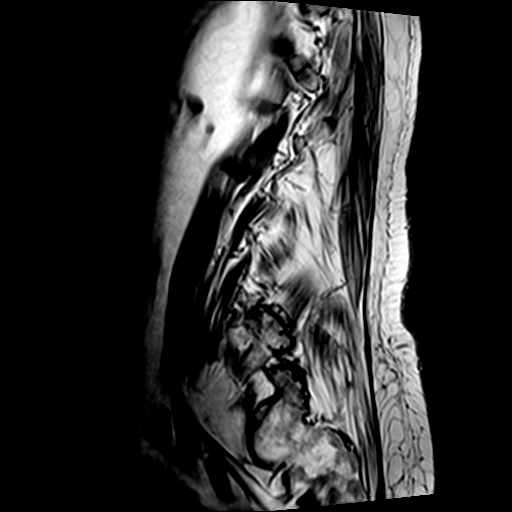

[Series 5: T1 · axial · 4.0mm · 0.39mm/px · z∈[-77,+78]mm · 8 of 32 slices shown (2 of 2)]
[im 1/32]
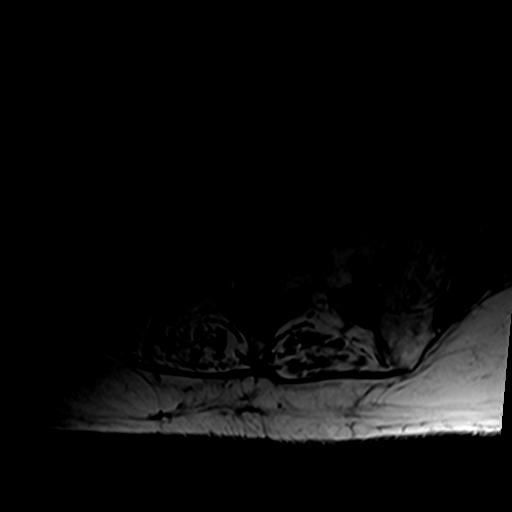
[im 4/32]
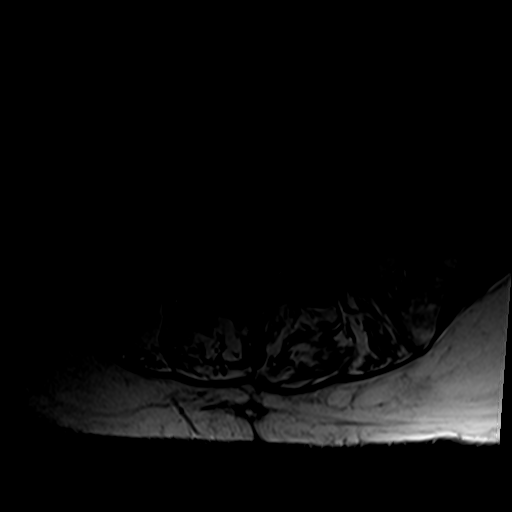
[im 7/32]
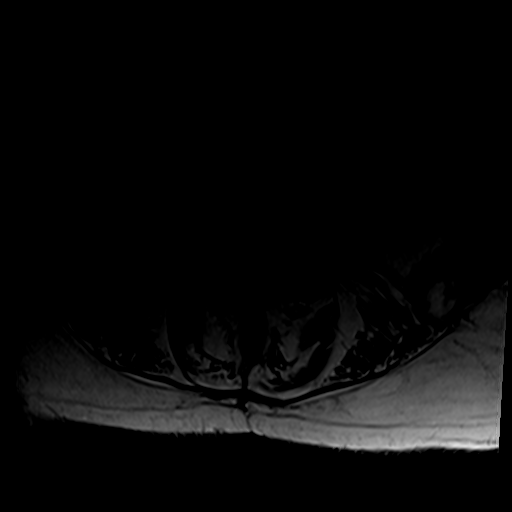
[im 10/32]
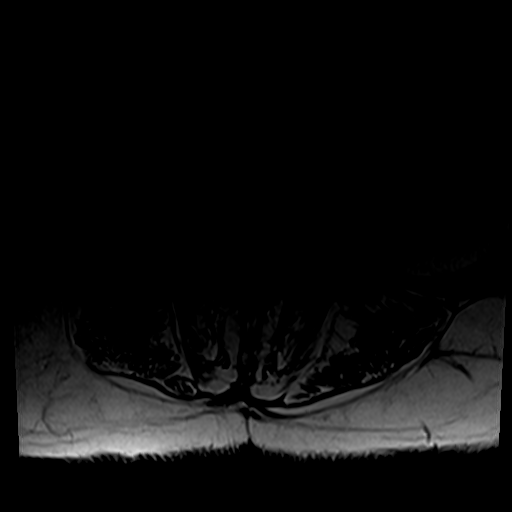
[im 13/32]
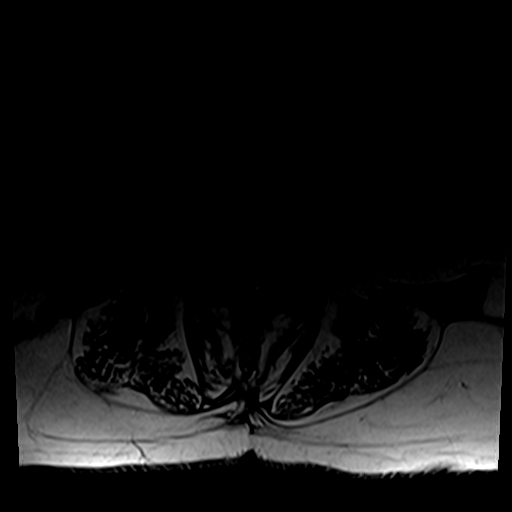
[im 16/32]
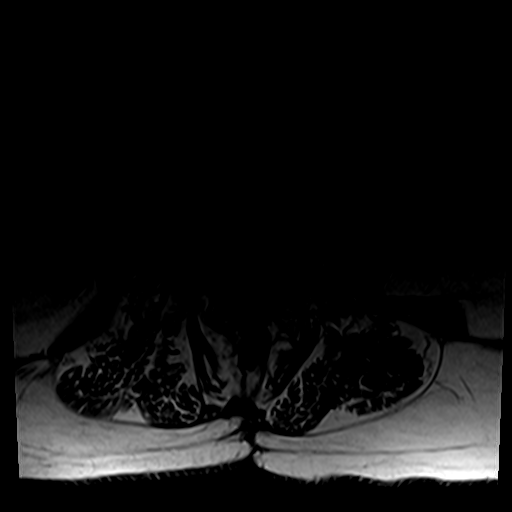
[im 19/32]
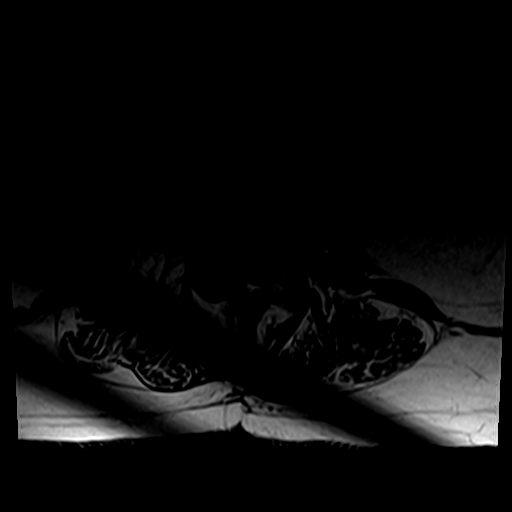
[im 28/32]
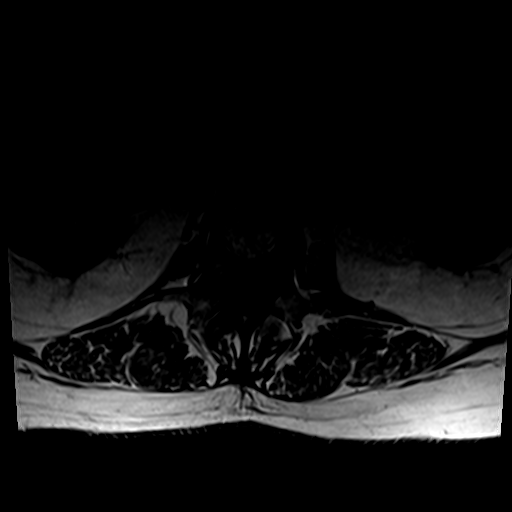

[Series 6: T2 · axial · 4.0mm · 0.78mm/px · z∈[-77,+122]mm · 11 of 32 slices shown (1 of 2)]
[im 1/32]
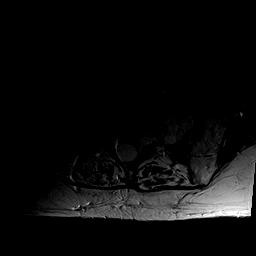
[im 4/32]
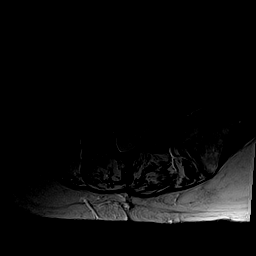
[im 7/32]
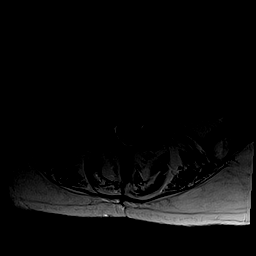
[im 10/32]
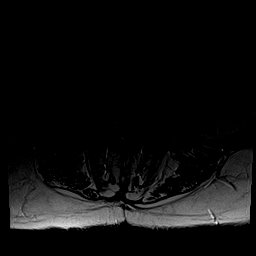
[im 13/32]
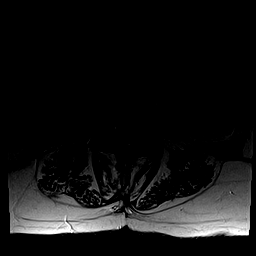
[im 16/32]
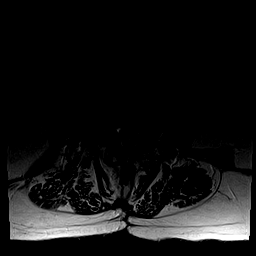
[im 19/32]
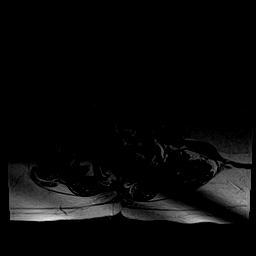
[im 22/32]
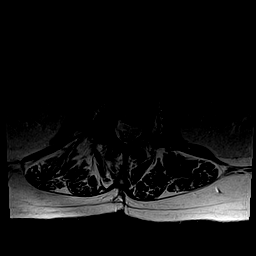
[im 25/32]
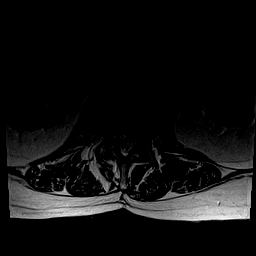
[im 28/32]
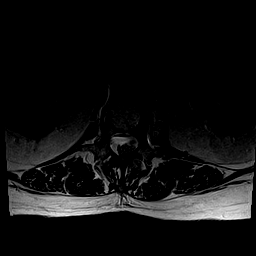
[im 32/32]
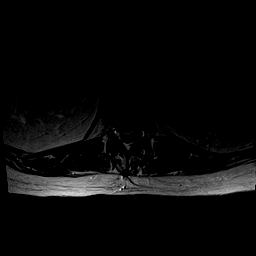

[Series 7: T2 · sagittal · 4.0mm · 0.47mm/px · 4 of 12 slices shown (2 of 2)]
[im 1/12]
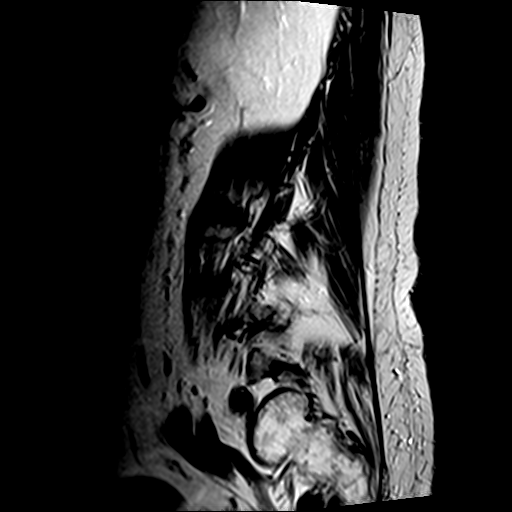
[im 4/12]
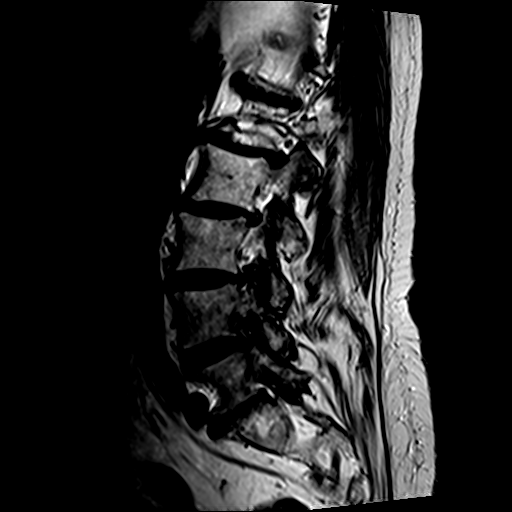
[im 8/12]
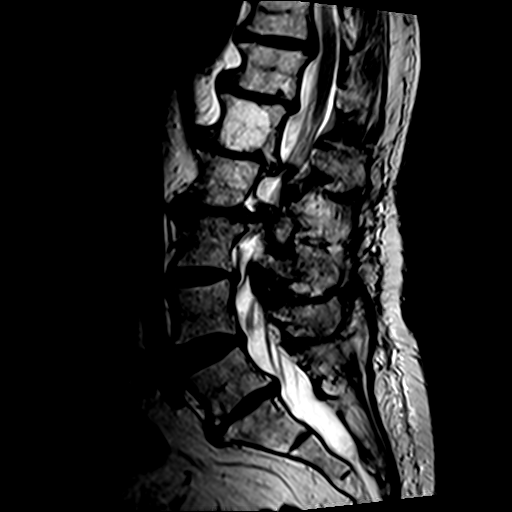
[im 12/12]
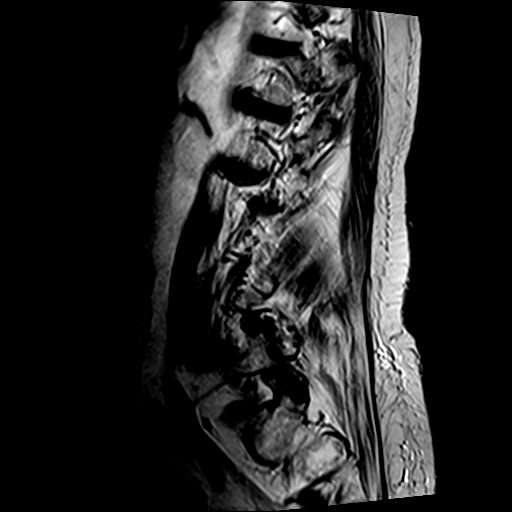

[26 of 48 positions shown; findings below may reference images not displayed]

FINDINGS: Segmentation:  Normal

Alignment: Mild retrolisthesis T12-L1, L1-2, L3-4. Lumbar
levoscoliosis.

Vertebrae: Negative for fracture or mass. Large hemangioma L1
vertebral body on the right is unchanged.

Conus medullaris and cauda equina: Conus extends to the L1 level.
Conus and cauda equina appear normal.

Paraspinal and other soft tissues: Mild hydronephrosis bilaterally
unchanged. No retroperitoneal mass.

Disc levels:

T11-12: Right paracentral disc protrusion is unchanged. Mild cord
flattening and mild spinal stenosis.

T12-L1: Retrolisthesis with disc and facet degeneration. Diffuse
endplate spurring. Mild left foraminal narrowing unchanged.

L1-2: 5 mm retrolisthesis with advanced disc degeneration and
spurring. Bilateral facet hypertrophy. Severe right foraminal
encroachment with compression of the right L1 nerve root unchanged.
Moderate left foraminal encroachment. Mild spinal stenosis.

L2-3: Advanced disc degeneration and spurring right greater than
left. Moderate right foraminal encroachment and subarticular
stenosis due to spurring unchanged. Mild spinal stenosis. Left
laminectomy.

L3-4: Disc degeneration and facet degeneration. Moderate
subarticular stenosis on the left.

L4-5: Left laminectomy. Disc degeneration with disc bulging and
spurring. Bilateral facet hypertrophy. Severe left foraminal
encroachment unchanged. No significant spinal stenosis

L5-S1: Right laminectomy. Moderate disc degeneration and spurring.
Mild foraminal narrowing bilaterally.
IMPRESSION: Extensive multilevel degenerative change throughout the lumbar spine
similar to 5020. Multilevel foraminal encroachment due to spurring
is similar. Multilevel mild spinal stenosis unchanged. No critical
spinal canal stenosis.

## 2019-11-11 ENCOUNTER — Telehealth: Payer: Self-pay | Admitting: Interventional Cardiology

## 2019-11-11 NOTE — Telephone Encounter (Signed)
Patient would like recent lab work results from her appt on 10/13/19 with Dr. Tamala Julian sent to Dr. Gavin Pound at [facility] [phone]

## 2019-11-16 ENCOUNTER — Other Ambulatory Visit: Payer: Medicare Other

## 2019-11-23 DIAGNOSIS — F5101 Primary insomnia: Secondary | ICD-10-CM | POA: Diagnosis not present

## 2019-11-23 DIAGNOSIS — D6869 Other thrombophilia: Secondary | ICD-10-CM | POA: Diagnosis not present

## 2019-11-23 DIAGNOSIS — I10 Essential (primary) hypertension: Secondary | ICD-10-CM | POA: Diagnosis not present

## 2019-11-23 DIAGNOSIS — F329 Major depressive disorder, single episode, unspecified: Secondary | ICD-10-CM | POA: Diagnosis not present

## 2019-11-23 DIAGNOSIS — L4050 Arthropathic psoriasis, unspecified: Secondary | ICD-10-CM | POA: Diagnosis not present

## 2019-11-23 DIAGNOSIS — M8580 Other specified disorders of bone density and structure, unspecified site: Secondary | ICD-10-CM | POA: Diagnosis not present

## 2019-11-23 DIAGNOSIS — Z79899 Other long term (current) drug therapy: Secondary | ICD-10-CM | POA: Diagnosis not present

## 2019-11-23 DIAGNOSIS — K5901 Slow transit constipation: Secondary | ICD-10-CM | POA: Diagnosis not present

## 2019-11-23 DIAGNOSIS — Z1389 Encounter for screening for other disorder: Secondary | ICD-10-CM | POA: Diagnosis not present

## 2019-11-23 DIAGNOSIS — I480 Paroxysmal atrial fibrillation: Secondary | ICD-10-CM | POA: Diagnosis not present

## 2019-11-23 DIAGNOSIS — E538 Deficiency of other specified B group vitamins: Secondary | ICD-10-CM | POA: Diagnosis not present

## 2019-11-23 DIAGNOSIS — E11319 Type 2 diabetes mellitus with unspecified diabetic retinopathy without macular edema: Secondary | ICD-10-CM | POA: Diagnosis not present

## 2019-11-23 DIAGNOSIS — E7800 Pure hypercholesterolemia, unspecified: Secondary | ICD-10-CM | POA: Diagnosis not present

## 2019-11-23 DIAGNOSIS — Z0000 Encounter for general adult medical examination without abnormal findings: Secondary | ICD-10-CM | POA: Diagnosis not present

## 2020-01-18 ENCOUNTER — Ambulatory Visit: Payer: Medicare Other | Attending: Internal Medicine

## 2020-01-18 DIAGNOSIS — Z23 Encounter for immunization: Secondary | ICD-10-CM | POA: Diagnosis not present

## 2020-01-18 NOTE — Progress Notes (Signed)
Covid-19 Vaccination Clinic  Name:  GALYA TORTORICE    MRN: [id number] DOB: [birth year]  01/18/2020  Ms. Mottola was observed post Covid-19 immunization for 15 minutes without incidence. She was provided with Vaccine Information Sheet and instruction to access the V-Safe system.   Ms. Nemec was instructed to call 911 with any severe reactions post vaccine: Marland Kitchen Difficulty breathing  . Swelling of your face and throat  . A fast heartbeat  . A bad rash all over your body  . Dizziness and weakness    Immunizations Administered    Name Date Dose VIS Date Route   Pfizer COVID-19 Vaccine 01/18/2020 10:09 AM 0.3 mL 12/10/2019 Intramuscular   Manufacturer: [employer], [employer]   Lot: [id number]   NDC: [address]-8

## 2020-01-24 ENCOUNTER — Ambulatory Visit: Payer: Medicare Other | Admitting: Neurology

## 2020-01-26 ENCOUNTER — Other Ambulatory Visit: Payer: Self-pay | Admitting: Geriatric Medicine

## 2020-01-26 DIAGNOSIS — Z1231 Encounter for screening mammogram for malignant neoplasm of breast: Secondary | ICD-10-CM

## 2020-02-01 ENCOUNTER — Other Ambulatory Visit: Payer: Medicare Other

## 2020-02-07 DIAGNOSIS — M2550 Pain in unspecified joint: Secondary | ICD-10-CM | POA: Diagnosis not present

## 2020-02-07 DIAGNOSIS — Z79899 Other long term (current) drug therapy: Secondary | ICD-10-CM | POA: Diagnosis not present

## 2020-02-07 DIAGNOSIS — L4059 Other psoriatic arthropathy: Secondary | ICD-10-CM | POA: Diagnosis not present

## 2020-02-07 DIAGNOSIS — L409 Psoriasis, unspecified: Secondary | ICD-10-CM | POA: Diagnosis not present

## 2020-02-08 ENCOUNTER — Ambulatory Visit: Payer: Medicare Other | Attending: Internal Medicine

## 2020-02-08 DIAGNOSIS — Z23 Encounter for immunization: Secondary | ICD-10-CM

## 2020-02-08 NOTE — Progress Notes (Signed)
Covid-19 Vaccination Clinic  Name:  [name]    MRN: [id number] DOB: 02/07/1934  02/08/2020  [name] was observed post Covid-19 immunization for 15 minutes without incidence. She was provided with Vaccine Information Sheet and instruction to access the V-Safe system.   [name] was instructed to call 911 with any severe reactions post vaccine: Marland Kitchen Difficulty breathing  . Swelling of your face and throat  . A fast heartbeat  . A bad rash all over your body  . Dizziness and weakness    Immunizations Administered    Name Date Dose VIS Date Route   Pfizer COVID-19 Vaccine 02/08/2020 10:02 AM 0.3 mL 12/10/2019 Intramuscular   Manufacturer: [employer], [employer]   Lot: [id number]   NDC: [address]-2

## 2020-02-15 DIAGNOSIS — L4059 Other psoriatic arthropathy: Secondary | ICD-10-CM | POA: Diagnosis not present

## 2020-02-24 DIAGNOSIS — I2510 Atherosclerotic heart disease of native coronary artery without angina pectoris: Secondary | ICD-10-CM | POA: Diagnosis not present

## 2020-02-24 DIAGNOSIS — Z8639 Personal history of other endocrine, nutritional and metabolic disease: Secondary | ICD-10-CM | POA: Diagnosis not present

## 2020-02-24 DIAGNOSIS — Z794 Long term (current) use of insulin: Secondary | ICD-10-CM | POA: Diagnosis not present

## 2020-02-24 DIAGNOSIS — K5900 Constipation, unspecified: Secondary | ICD-10-CM | POA: Diagnosis not present

## 2020-02-24 DIAGNOSIS — E11319 Type 2 diabetes mellitus with unspecified diabetic retinopathy without macular edema: Secondary | ICD-10-CM | POA: Diagnosis not present

## 2020-03-03 ENCOUNTER — Ambulatory Visit: Payer: Medicare Other

## 2020-03-28 DIAGNOSIS — L720 Epidermal cyst: Secondary | ICD-10-CM | POA: Diagnosis not present

## 2020-03-28 DIAGNOSIS — B351 Tinea unguium: Secondary | ICD-10-CM | POA: Diagnosis not present

## 2020-03-28 DIAGNOSIS — Z85828 Personal history of other malignant neoplasm of skin: Secondary | ICD-10-CM | POA: Diagnosis not present

## 2020-03-28 DIAGNOSIS — L821 Other seborrheic keratosis: Secondary | ICD-10-CM | POA: Diagnosis not present

## 2020-04-10 ENCOUNTER — Ambulatory Visit
Admission: RE | Admit: 2020-04-10 | Discharge: 2020-04-10 | Disposition: A | Discharge status: Home / Self Care | Payer: Medicare Other | Source: Ambulatory Visit | Attending: Geriatric Medicine | Admitting: Geriatric Medicine

## 2020-04-10 ENCOUNTER — Other Ambulatory Visit: Payer: Self-pay

## 2020-04-10 DIAGNOSIS — Z780 Asymptomatic menopausal state: Secondary | ICD-10-CM | POA: Diagnosis not present

## 2020-04-10 DIAGNOSIS — E2839 Other primary ovarian failure: Secondary | ICD-10-CM

## 2020-04-10 DIAGNOSIS — M810 Age-related osteoporosis without current pathological fracture: Secondary | ICD-10-CM | POA: Diagnosis not present

## 2020-04-10 DIAGNOSIS — S72002A Fracture of unspecified part of neck of left femur, initial encounter for closed fracture: Secondary | ICD-10-CM

## 2020-05-23 DIAGNOSIS — E11319 Type 2 diabetes mellitus with unspecified diabetic retinopathy without macular edema: Secondary | ICD-10-CM | POA: Diagnosis not present

## 2020-05-23 DIAGNOSIS — I2510 Atherosclerotic heart disease of native coronary artery without angina pectoris: Secondary | ICD-10-CM | POA: Diagnosis not present

## 2020-05-23 DIAGNOSIS — I480 Paroxysmal atrial fibrillation: Secondary | ICD-10-CM | POA: Diagnosis not present

## 2020-05-23 DIAGNOSIS — D6869 Other thrombophilia: Secondary | ICD-10-CM | POA: Diagnosis not present

## 2020-05-23 DIAGNOSIS — Z79899 Other long term (current) drug therapy: Secondary | ICD-10-CM | POA: Diagnosis not present

## 2020-05-23 DIAGNOSIS — D692 Other nonthrombocytopenic purpura: Secondary | ICD-10-CM | POA: Diagnosis not present

## 2020-05-23 DIAGNOSIS — K219 Gastro-esophageal reflux disease without esophagitis: Secondary | ICD-10-CM | POA: Diagnosis not present

## 2020-05-23 DIAGNOSIS — Z794 Long term (current) use of insulin: Secondary | ICD-10-CM | POA: Diagnosis not present

## 2020-05-23 DIAGNOSIS — I209 Angina pectoris, unspecified: Secondary | ICD-10-CM | POA: Diagnosis not present

## 2020-05-23 DIAGNOSIS — N3941 Urge incontinence: Secondary | ICD-10-CM | POA: Diagnosis not present

## 2020-05-23 DIAGNOSIS — Z8639 Personal history of other endocrine, nutritional and metabolic disease: Secondary | ICD-10-CM | POA: Diagnosis not present

## 2020-05-23 DIAGNOSIS — R350 Frequency of micturition: Secondary | ICD-10-CM | POA: Diagnosis not present

## 2020-05-23 DIAGNOSIS — E7800 Pure hypercholesterolemia, unspecified: Secondary | ICD-10-CM | POA: Diagnosis not present

## 2020-05-23 DIAGNOSIS — K5900 Constipation, unspecified: Secondary | ICD-10-CM | POA: Diagnosis not present

## 2020-06-19 ENCOUNTER — Telehealth: Payer: Self-pay | Admitting: Interventional Cardiology

## 2020-06-19 NOTE — Telephone Encounter (Signed)
New message   Pt c/o swelling: STAT is pt has developed SOB within 24 hours  1) How much weight have you gained and in what time span? Patient does not know   2) If swelling, where is the swelling located? Lower legs, ankles and feet   3) Are you currently taking a fluid pill? No   4) Are you currently SOB? No   5) Do you have a log of your daily weights (if so, list)? No   6) Have you gained 3 pounds in a day or 5 pounds in a week? No   7) Have you traveled recently? No

## 2020-06-19 NOTE — Telephone Encounter (Signed)
Pt states she has been having swelling in her feet, legs and ankles for the last 2-2.5 weeks.  Makes an indent that takes a few seconds to bounce back.  One leg worse than the other.  Denies pain or redness to legs.  Denies SOB, no issues with breathing when lying down.  Denies increased salt in the diet.  Denies increased activity.  Not currently on a fluid pill. Advised I will send to Dr. Tamala Julian for review.

## 2020-06-19 NOTE — Telephone Encounter (Signed)
Spoke with pt and made her aware of recommendations.  Pt verbalized understanding.  

## 2020-06-19 NOTE — Telephone Encounter (Signed)
Decrease salt intake, elevate legs, avoid prolonged sitting with legs dangling, and if does not improve may need to be seen in the office to further evaluate.  Let us know if there is any redness or soreness in the legs.

## 2020-06-21 NOTE — Telephone Encounter (Signed)
Pt has been putting on compression stockings since we spoke on Monday.  Developed SOB yesterday.  Has heaviness in chest when SOB.  Has not weighed.  Denies salt intake.  Scheduled pt for first available appointment with APP on 6/29.  Will route to Dr. Tamala Julian to see if further recommendations.

## 2020-06-21 NOTE — Telephone Encounter (Signed)
Called pt and moved her appt to Monday with Dr. Tamala Julian.  Advised I would call back if he wanted to try her on a diuretic or had other recommendations prior to the appt.

## 2020-06-21 NOTE — Telephone Encounter (Signed)
Start furosemide 20 mg daily. Get BMET and BNP on Monday (SOB) appointment and get ECG to be sure not in AFIB.  She needs to record BP for [place].

## 2020-06-21 NOTE — Telephone Encounter (Signed)
PT called back and had some follow-up questions about her conversation with Anderson Malta from Monday. Please call back

## 2020-06-22 MED ORDER — FUROSEMIDE 20 MG PO TABS: 20.0000 mg | ORAL_TABLET | Freq: Every day | ORAL | 3 refills | Status: DC

## 2020-06-22 NOTE — Addendum Note (Signed)
Addended by: Harland German A on: 06/22/2020 12:02 PM   Modules accepted: Orders

## 2020-06-22 NOTE — Telephone Encounter (Signed)
Instructed patient to START FUROSEMIDE 20 mg daily. She understands she will have labs drawn and EKG at her upcoming appointment. The patient is due for delivery from her pharmacy today. She will call [facility] and request the new medication is added (note placed on rx as well). She was grateful for assistance.

## 2020-06-25 NOTE — Progress Notes (Signed)
Cardiology Office Note:    Date:  06/26/2020   ID:  [name], DOB 06/21/1934, MRN [id number]  PCP:  [name], MD  Cardiologist:  Lesleigh Noe, MD   Referring MD: [name], MD   Chief Complaint  Patient presents with  . Shortness of Breath  . Edema    History of Present Illness:    [name] is a 86 y.o. female with a hx of coronary artery disease status post CABG in 2006, paroxysmal atrial fibrillation on chronic anticoagulation with dabigatran, rheumatoid arthritis, diabetes, hypertension, prior stroke, complicated migraines, now c/o LEE and dyspnea for 3 weeks. Started on furosemide 20 mg daily 4 days ago.  Over the past several weeks she has noted lower extremity swelling and exertional fatigue.  She has not had chest pain.  There is no orthopnea.  Based upon a phone call, we recommended support stockings and started furosemide 20 mg/day.  Swelling is much improved.  Exertional fatigue exists and is not changed.   Past Medical History:  Diagnosis Date  . Anginal pain (HCC)    "saw doctor-think it was esophagus spasm"  . Arthritis   . Atrial flutter (HCC) 11/02/2013  . Cataract    left eye  . Coronary artery disease   . Diabetes mellitus without complication (HCC)    type 2  . Essential hypertension, benign 11/02/2013  . H/O jaundice    as child  . Headache 2015   complicated migraine x2   . Hearing trouble   . Heart disease   . Hepatitis    hx of hepatitis A as child  . High cholesterol    under control  . Long term (current) use of anticoagulants 11/02/2013   Pradaxa   . Measles    as child  . Mouth dryness   . Mumps    as teenager  . Numbness and tingling    lips  . Osteoporosis    "unsure-maybe in knees"  . Psoriasis   . Psoriatic arthritis (HCC)   . Stroke (HCC) 2012  . Vertigo    being monitored, occasional    Past Surgical History:  Procedure Laterality Date  . APPENDECTOMY  as child  . CARDIAC CATHETERIZATION  ~2006    clear  . CORONARY ARTERY BYPASS GRAFT  02/16/2005  . LUMBAR DISC SURGERY  09/2004   L1-L5  . TONSILLECTOMY  as child   with adenoids  . TOTAL HIP ARTHROPLASTY Left 04/19/2019   Procedure: TOTAL HIP ARTHROPLASTY ANTERIOR APPROACH;  Surgeon: Ollen Gross, MD;  Location: [facility];  Service: Orthopedics;  Laterality: Left;  . TOTAL KNEE ARTHROPLASTY Left 09/11/2015   Procedure: LEFT TOTAL KNEE ARTHROPLASTY;  Surgeon: Ollen Gross, MD;  Location: [facility];  Service: Orthopedics;  Laterality: Left;    Current Medications: Current Meds  Medication Sig  . acetaminophen (TYLENOL) 500 MG tablet Take 500 mg by mouth every 6 (six) hours as needed for mild pain.  . calcium elemental as carbonate (TUMS ULTRA 1000) 400 MG chewable tablet Chew 1,000 mg by mouth every evening.  . carvedilol (COREG) 25 MG tablet Take 1 tablet (25 mg total) by mouth 2 (two) times daily with a meal.  . cholecalciferol (VITAMIN D) 1000 units tablet Take 2,000 Units by mouth daily.  . clobetasol cream (TEMOVATE) 0.05 % Apply 1 application topically 2 (two) times daily as needed (skin irritation).  . clonazePAM (KLONOPIN) 0.5 MG tablet Take 0.5 mg by mouth at bedtime as needed (  for sleep).   . ENBREL 50 MG/ML injection Inject 50 mg into the skin once a week.   . furosemide (LASIX) 20 MG tablet Take 1 tablet (20 mg total) by mouth daily.  Marland Kitchen HUMALOG MIX 50/50 KWIKPEN (50-50) 100 UNIT/ML Kwikpen Inject 40 Units into the skin 2 (two) times daily.  . Loratadine (CLARITIN PO) Take 1 tablet by mouth daily.  . methocarbamol (ROBAXIN) 500 MG tablet Take 1 tablet (500 mg total) by mouth every 6 (six) hours as needed for muscle spasms.  Marland Kitchen MYRBETRIQ 25 MG TB24 tablet Take 25 mg by mouth daily.  . nitroGLYCERIN (NITROSTAT) 0.4 MG SL tablet Place 1 tablet (0.4 mg total) under the tongue every 5 (five) minutes x 3 doses as needed for chest pain.  Marland Kitchen nystatin-triamcinolone ointment (MYCOLOG) Apply 1 application topically as directed.   .  polyethylene glycol (MIRALAX / GLYCOLAX) 17 g packet Take 17 g by mouth daily.  Marland Kitchen PRADAXA 150 MG CAPS Take 150 mg by mouth 2 (two) times daily.   . Probiotic Product (ALIGN PO) Take 1 capsule by mouth daily.  . ramipril (ALTACE) 5 MG capsule TAKE (1) CAPSULE TWICE DAILY.  . rosuvastatin (CRESTOR) 10 MG tablet Take 10 mg by mouth at bedtime.   . vitamin B-12 (CYANOCOBALAMIN) 250 MCG tablet Take 250 mcg by mouth daily.     Allergies:   Codeine, Penicillins, Atenolol, Atorvastatin, Metoprolol, and Zetia [ezetimibe]   Social History   Socioeconomic History  . Marital status: Married    Spouse name: Jillyn Hidden  . Number of children: 3  . Years of education: 16  . Highest education level: Bachelor's degree (e.g., BA, AB, BS)  Occupational History  . Occupation: Retired  Tobacco Use  . Smoking status: Never Smoker  . Smokeless tobacco: Never Used  Vaping Use  . Vaping Use: Never used  Substance and Sexual Activity  . Alcohol use: No    Alcohol/week: 0.0 standard drinks  . Drug use: No  . Sexual activity: Not on file  Other Topics Concern  . Not on file  Social History Narrative   Patient is married with 3 children.   Patient is left handed.   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   Patient drinks 3 cups daily.   Social Determinants of Health   Financial Resource Strain:   . Difficulty of Paying Living Expenses:   Food Insecurity:   . Worried About Programme researcher, broadcasting/film/video in the Last Year:   . Barista in the Last Year:   Transportation Needs:   . Freight forwarder (Medical):   Marland Kitchen Lack of Transportation (Non-Medical):   Physical Activity:   . Days of Exercise per Week:   . Minutes of Exercise per Session:   Stress:   . Feeling of Stress :   Social Connections:   . Frequency of Communication with Friends and Family:   . Frequency of Social Gatherings with Friends and Family:   . Attends Religious Services:   . Active Member of Clubs or Organizations:   .  Attends Banker Meetings:   Marland Kitchen Marital Status:      Family History: The patient's family history includes Heart failure in her mother; Neuromuscular disorder in her father.  ROS:   Please see the history of present illness.    Very hard of hearing.  Difficult to get firsthand history.  Does not like wearing support stockings.  Denies orthopnea.  Cannot tell  if her breathing is better since starting furosemide.  Does not recognize any increased urine output since starting the diuretic.  All other systems reviewed and are negative.  EKGs/Labs/Other Studies Reviewed:    The following studies were reviewed today: No recent cardiac evaluation  EKG:  EKG sinus rhythm 70 bpm with left bundle branch block and left anterior hemiblock.  PR interval 230 ms.  Recent Labs: 10/13/2019: BUN 18; Creatinine, Ser 0.87; Hemoglobin 13.5; Platelets 171; Potassium 4.5; Sodium 138  Recent Lipid Panel    Component Value Date/Time   CHOL 137 08/05/2014 0453   TRIG 105 08/05/2014 0453   HDL 65 08/05/2014 0453   CHOLHDL 2.1 08/05/2014 0453   VLDL 21 08/05/2014 0453   LDLCALC 51 08/05/2014 0453    Physical Exam:    VS:  BP (!) 104/56   Pulse 69   Ht 5\' 2"  (1.575 m)   Wt 197 lb 3.2 oz (89.4 kg)   SpO2 97%   BMI 36.07 kg/m     Wt Readings from Last 3 Encounters:  06/26/20 197 lb 3.2 oz (89.4 kg)  10/13/19 182 lb (82.6 kg)  07/21/19 174 lb (78.9 kg)     GEN: Obese and pale appearing. No acute distress HEENT: Normal NECK: No JVD. LYMPHATICS: No lymphadenopathy CARDIAC:  RRR without murmur, gallop, or edema. VASCULAR: There is no peripheral edema.  Normal Pulses. No bruits. RESPIRATORY:  Clear to auscultation without rales, wheezing or rhonchi  ABDOMEN: Soft, non-tender, non-distended, No pulsatile mass, MUSCULOSKELETAL: No deformity  SKIN: Warm and dry NEUROLOGIC:  Alert and oriented x 3 PSYCHIATRIC:  Normal affect   ASSESSMENT:    1. SOB (shortness of breath)   2.  Bilateral leg edema   3. Coronary artery disease involving native coronary artery of native heart without angina pectoris   4. Long term current use of anticoagulant therapy   5. Essential hypertension   6. Left bundle branch block   7. Paroxysmal atrial fibrillation (HCC)   8. Educated about COVID-19 virus infection    PLAN:    In order of problems listed above:  1. Fatigue and dyspnea have not necessarily improved on the short current dose of furosemide 20 mg/day.  Support stockings plus furosemide has led to significant reduction in lower extremity edema.  Plan to discontinue furosemide.  Blood pressure is relatively low for age.  Check Cmet, CBC, and BNP.  A 2D Doppler echocardiogram will also be performed to exclude systolic dysfunction and to assess diastolic function. 2. Resolved.  Wearing support stockings today. 3. Coronary disease not discussed.  Not symptomatic relative to angina. 4. Continue Pradaxa.  Hemoglobin is being checked today. 5. BP is too low for age.  Consider decreasing intensity of carvedilol and ramipril.  Will rule out dehydration before changing her longstanding medication regimen. 6. Unchanged in appearance 7. No symptoms to suggest paroxysmal A. Fib 8. She has been vaccinated.   BNP, comprehensive metabolic panel, and CBC are obtained today.  Furosemide is discontinued.  If edema returns rapidly, she will require diuretic therapy, perhaps Aldactone.  In that case we will decrease ramipril and carvedilol dose by 50%.  Would not make the change until lab work is evaluated.    Medication Adjustments/Labs and Tests Ordered: Current medicines are reviewed at length with the patient today.  Concerns regarding medicines are outlined above.  No orders of the defined types were placed in this encounter.  No orders of the defined types were placed in this  encounter.   There are no Patient Instructions on file for this visit.   Signed, Lesleigh Noe, MD    06/26/2020 11:23 AM    [facility]

## 2020-06-26 ENCOUNTER — Encounter: Payer: Self-pay | Admitting: Interventional Cardiology

## 2020-06-26 ENCOUNTER — Other Ambulatory Visit: Payer: Self-pay

## 2020-06-26 ENCOUNTER — Ambulatory Visit (INDEPENDENT_AMBULATORY_CARE_PROVIDER_SITE_OTHER): Payer: Medicare Other | Admitting: Interventional Cardiology

## 2020-06-26 VITALS — BP 104/56 | HR 69 | Ht 62.0 in | Wt 197.2 lb

## 2020-06-26 DIAGNOSIS — Z7189 Other specified counseling: Secondary | ICD-10-CM

## 2020-06-26 DIAGNOSIS — I10 Essential (primary) hypertension: Secondary | ICD-10-CM

## 2020-06-26 DIAGNOSIS — R600 Localized edema: Secondary | ICD-10-CM

## 2020-06-26 DIAGNOSIS — R0602 Shortness of breath: Secondary | ICD-10-CM | POA: Diagnosis not present

## 2020-06-26 DIAGNOSIS — Z7901 Long term (current) use of anticoagulants: Secondary | ICD-10-CM

## 2020-06-26 DIAGNOSIS — I2510 Atherosclerotic heart disease of native coronary artery without angina pectoris: Secondary | ICD-10-CM | POA: Diagnosis not present

## 2020-06-26 DIAGNOSIS — I480 Paroxysmal atrial fibrillation: Secondary | ICD-10-CM

## 2020-06-26 DIAGNOSIS — I447 Left bundle-branch block, unspecified: Secondary | ICD-10-CM

## 2020-06-26 NOTE — Patient Instructions (Signed)
Medication Instructions:  1) DISCONTINUE Furosemide   *If you need a refill on your cardiac medications before your next appointment, please call your pharmacy*   Lab Work: BMET, Pro BNP, Liver and CBC today  If you have labs (blood work) drawn today and your tests are completely normal, you will receive your results only by: Marland Kitchen MyChart Message (if you have MyChart) OR . A paper copy in the mail If you have any lab test that is abnormal or we need to change your treatment, we will call you to review the results.   Testing/Procedures: Your physician has requested that you have an echocardiogram. Echocardiography is a painless test that uses sound waves to create images of your heart. It provides your doctor with information about the size and shape of your heart and how well your heart's chambers and valves are working. This procedure takes approximately one hour. There are no restrictions for this procedure.    Follow-Up: At [facility], you and your health needs are our priority.  As part of our continuing mission to provide you with exceptional heart care, we have created designated Provider Care Teams.  These Care Teams include your primary Cardiologist (physician) and Advanced Practice Providers (APPs -  Physician Assistants and Nurse Practitioners) who all work together to provide you with the care you need, when you need it.  We recommend signing up for the patient portal called "MyChart".  Sign up information is provided on this After Visit Summary.  MyChart is used to connect with patients for Virtual Visits (Telemedicine).  Patients are able to view lab/test results, encounter notes, upcoming appointments, etc.  Non-urgent messages can be sent to your provider as well.   To learn more about what you can do with MyChart, go to [email or web address].    Your next appointment:   In October  The format for your next appointment:   In Person  Provider:   You may see Sinclair Grooms, MD or one of the following Advanced Practice Providers on your designated Care Team:    Truitt Merle, NP  Cecilie Kicks, NP  Kathyrn Drown, NP    Other Instructions

## 2020-06-26 NOTE — Addendum Note (Signed)
Addended by: Eulis Foster on: 06/26/2020 11:49 AM   Modules accepted: Orders

## 2020-06-27 ENCOUNTER — Ambulatory Visit: Payer: Medicare Other | Admitting: General Practice

## 2020-06-27 LAB — HEPATIC FUNCTION PANEL
ALT: 10 IU/L (ref 0–32)
AST: 16 IU/L (ref 0–40)
Albumin: 4.2 g/dL (ref 3.6–4.6)
Alkaline Phosphatase: 99 IU/L (ref 48–121)
Bilirubin Total: 0.4 mg/dL (ref 0.0–1.2)
Bilirubin, Direct: 0.14 mg/dL (ref 0.00–0.40)
Total Protein: 6.6 g/dL (ref 6.0–8.5)

## 2020-06-27 LAB — BASIC METABOLIC PANEL
BUN/Creatinine Ratio: 23 (ref 12–28)
BUN: 22 mg/dL (ref 8–27)
CO2: 23 mmol/L (ref 20–29)
Calcium: 9.3 mg/dL (ref 8.7–10.3)
Chloride: 103 mmol/L (ref 96–106)
Creatinine, Ser: 0.94 mg/dL (ref 0.57–1.00)
GFR calc Af Amer: 64 mL/min/{1.73_m2} (ref >59)
GFR calc non Af Amer: 56 mL/min/{1.73_m2} — ABNORMAL LOW (ref >59)
Glucose: 250 mg/dL — ABNORMAL HIGH (ref 65–99)
Potassium: 4.5 mmol/L (ref 3.5–5.2)
Sodium: 140 mmol/L (ref 134–144)

## 2020-06-27 LAB — CBC
Hematocrit: 40.9 % (ref 34.0–46.6)
Hemoglobin: 13.5 g/dL (ref 11.1–15.9)
MCH: 29.8 pg (ref 26.6–33.0)
MCHC: 33 g/dL (ref 31.5–35.7)
MCV: 90 fL (ref 79–97)
Platelets: 184 10*3/uL (ref 150–450)
RBC: 4.53 x10E6/uL (ref 3.77–5.28)
RDW: 12.9 % (ref 11.7–15.4)
WBC: 7.3 10*3/uL (ref 3.4–10.8)

## 2020-06-27 LAB — PRO B NATRIURETIC PEPTIDE: NT-Pro BNP: 206 pg/mL (ref 0–738)

## 2020-07-18 DIAGNOSIS — I2510 Atherosclerotic heart disease of native coronary artery without angina pectoris: Secondary | ICD-10-CM | POA: Diagnosis not present

## 2020-07-18 DIAGNOSIS — E11319 Type 2 diabetes mellitus with unspecified diabetic retinopathy without macular edema: Secondary | ICD-10-CM | POA: Diagnosis not present

## 2020-07-18 DIAGNOSIS — Z794 Long term (current) use of insulin: Secondary | ICD-10-CM | POA: Diagnosis not present

## 2020-07-18 DIAGNOSIS — Z8639 Personal history of other endocrine, nutritional and metabolic disease: Secondary | ICD-10-CM | POA: Diagnosis not present

## 2020-07-20 ENCOUNTER — Telehealth: Payer: Self-pay | Admitting: Interventional Cardiology

## 2020-07-20 ENCOUNTER — Other Ambulatory Visit: Payer: Self-pay

## 2020-07-20 ENCOUNTER — Ambulatory Visit (HOSPITAL_COMMUNITY): Payer: Medicare Other | Attending: Cardiology

## 2020-07-20 DIAGNOSIS — R0602 Shortness of breath: Secondary | ICD-10-CM | POA: Insufficient documentation

## 2020-07-20 DIAGNOSIS — I2510 Atherosclerotic heart disease of native coronary artery without angina pectoris: Secondary | ICD-10-CM | POA: Diagnosis not present

## 2020-07-20 LAB — ECHOCARDIOGRAM COMPLETE
Area-P 1/2: 2.91 cm2
S' Lateral: 1.7 cm

## 2020-07-20 MED ORDER — PERFLUTREN LIPID MICROSPHERE
1.0000 mL | INTRAVENOUS | Status: AC | PRN
  Administered 2020-07-20: 2 mL via INTRAVENOUS

## 2020-07-20 NOTE — Telephone Encounter (Signed)
Patient returning call for echo results. 

## 2020-07-20 NOTE — Telephone Encounter (Signed)
Informed pt of results. Pt verbalized understanding. 

## 2020-08-08 DIAGNOSIS — M150 Primary generalized (osteo)arthritis: Secondary | ICD-10-CM | POA: Diagnosis not present

## 2020-08-08 DIAGNOSIS — L409 Psoriasis, unspecified: Secondary | ICD-10-CM | POA: Diagnosis not present

## 2020-08-08 DIAGNOSIS — M2550 Pain in unspecified joint: Secondary | ICD-10-CM | POA: Diagnosis not present

## 2020-08-08 DIAGNOSIS — L4059 Other psoriatic arthropathy: Secondary | ICD-10-CM | POA: Diagnosis not present

## 2020-08-08 DIAGNOSIS — Z6836 Body mass index (BMI) 36.0-36.9, adult: Secondary | ICD-10-CM | POA: Diagnosis not present

## 2020-08-08 DIAGNOSIS — Z79899 Other long term (current) drug therapy: Secondary | ICD-10-CM | POA: Diagnosis not present

## 2020-08-08 DIAGNOSIS — E669 Obesity, unspecified: Secondary | ICD-10-CM | POA: Diagnosis not present

## 2020-09-22 DIAGNOSIS — E11319 Type 2 diabetes mellitus with unspecified diabetic retinopathy without macular edema: Secondary | ICD-10-CM | POA: Diagnosis not present

## 2020-09-22 DIAGNOSIS — Z7189 Other specified counseling: Secondary | ICD-10-CM | POA: Diagnosis not present

## 2020-09-22 DIAGNOSIS — I2510 Atherosclerotic heart disease of native coronary artery without angina pectoris: Secondary | ICD-10-CM | POA: Diagnosis not present

## 2020-09-22 DIAGNOSIS — Z794 Long term (current) use of insulin: Secondary | ICD-10-CM | POA: Diagnosis not present

## 2020-09-22 DIAGNOSIS — Z8639 Personal history of other endocrine, nutritional and metabolic disease: Secondary | ICD-10-CM | POA: Diagnosis not present

## 2020-09-24 DIAGNOSIS — Z23 Encounter for immunization: Secondary | ICD-10-CM | POA: Diagnosis not present

## 2020-10-02 DIAGNOSIS — Z961 Presence of intraocular lens: Secondary | ICD-10-CM | POA: Diagnosis not present

## 2020-10-02 DIAGNOSIS — E113291 Type 2 diabetes mellitus with mild nonproliferative diabetic retinopathy without macular edema, right eye: Secondary | ICD-10-CM | POA: Diagnosis not present

## 2020-10-02 DIAGNOSIS — H52203 Unspecified astigmatism, bilateral: Secondary | ICD-10-CM | POA: Diagnosis not present

## 2020-10-02 DIAGNOSIS — H04123 Dry eye syndrome of bilateral lacrimal glands: Secondary | ICD-10-CM | POA: Diagnosis not present

## 2020-10-05 NOTE — Progress Notes (Signed)
Cardiology Office Note:    Date:  10/09/2020   ID:  [name], DOB [birth year], MRN [id number]  PCP:  [name], MD  Cardiologist:  Lesleigh Noe, MD   Referring MD: [name], MD   Chief Complaint  Patient presents with  . Coronary Artery Disease  . Atrial Fibrillation  . Congestive Heart Failure    History of Present Illness:    [name] is a 86 y.o. female with a hx of coronary artery disease status post CABG in 2006, paroxysmal atrial fibrillation on chronic anticoagulation with dabigatran, rheumatoid arthritis, diabetes, hypertension, prior stroke, complicated migraines, now c/o LEE and dyspnea for 3 weeks. Started on furosemide 20 mg daily 4 days ago but we discontinued because it did not resolve lower extremity swelling..   We did an echocardiogram which clearly demonstrated left ventricular hypertrophy, diastolic dysfunction, but preserved systolic function.  A right ventricular pulmonary pressure estimate was not possible.  The inferior vena cava collapse with inspiration.  The furosemide, 20 mg/day, did not improve lower extremity swelling and it was discontinued because it was causing an increase in her baseline urinary frequency.  She denies dyspnea and orthopnea.  Past Medical History:  Diagnosis Date  . Anginal pain (HCC)    "saw doctor-think it was esophagus spasm"  . Arthritis   . Atrial flutter (HCC) 11/02/2013  . Cataract    left eye  . Coronary artery disease   . Diabetes mellitus without complication (HCC)    type 2  . Essential hypertension, benign 11/02/2013  . H/O jaundice    as child  . Headache 2015   complicated migraine x2   . Hearing trouble   . Heart disease   . Hepatitis    hx of hepatitis A as child  . High cholesterol    under control  . Long term (current) use of anticoagulants 11/02/2013   Pradaxa   . Measles    as child  . Mouth dryness   . Mumps    as teenager  . Numbness and tingling    lips  .  Osteoporosis    "unsure-maybe in knees"  . Psoriasis   . Psoriatic arthritis (HCC)   . Stroke (HCC) 2012  . Vertigo    being monitored, occasional    Past Surgical History:  Procedure Laterality Date  . APPENDECTOMY  as child  . CARDIAC CATHETERIZATION  ~2006   clear  . CORONARY ARTERY BYPASS GRAFT  02/16/2005  . LUMBAR DISC SURGERY  09/2004   L1-L5  . TONSILLECTOMY  as child   with adenoids  . TOTAL HIP ARTHROPLASTY Left 04/19/2019   Procedure: TOTAL HIP ARTHROPLASTY ANTERIOR APPROACH;  Surgeon: Ollen Gross, MD;  Location: [facility];  Service: Orthopedics;  Laterality: Left;  . TOTAL KNEE ARTHROPLASTY Left 09/11/2015   Procedure: LEFT TOTAL KNEE ARTHROPLASTY;  Surgeon: Ollen Gross, MD;  Location: [facility];  Service: Orthopedics;  Laterality: Left;    Current Medications: Current Meds  Medication Sig  . acetaminophen (TYLENOL) 500 MG tablet Take 500 mg by mouth every 6 (six) hours as needed for mild pain.  . calcium elemental as carbonate (TUMS ULTRA 1000) 400 MG chewable tablet Chew 1,000 mg by mouth every evening.  . carvedilol (COREG) 25 MG tablet Take 1 tablet (25 mg total) by mouth 2 (two) times daily with a meal.  . cholecalciferol (VITAMIN D) 1000 units tablet Take 2,000 Units by mouth daily.  . clobetasol cream (TEMOVATE) 0.05 %  Apply 1 application topically 2 (two) times daily as needed (skin irritation).  . clonazePAM (KLONOPIN) 0.5 MG tablet Take 0.5 mg by mouth at bedtime as needed (for sleep).   . ENBREL 50 MG/ML injection Inject 50 mg into the skin once a week.   Marland Kitchen HUMALOG MIX 50/50 KWIKPEN (50-50) 100 UNIT/ML Kwikpen Inject into the skin 2 (two) times daily. 56 Units in AM; 28 Units in PM  . Loratadine (CLARITIN PO) Take 1 tablet by mouth daily.  . nitroGLYCERIN (NITROSTAT) 0.4 MG SL tablet Place 1 tablet (0.4 mg total) under the tongue every 5 (five) minutes x 3 doses as needed for chest pain.  Marland Kitchen nystatin-triamcinolone ointment (MYCOLOG) Apply 1 application  topically as directed.   . polyethylene glycol (MIRALAX / GLYCOLAX) 17 g packet Take 17 g by mouth daily.  Marland Kitchen PRADAXA 150 MG CAPS Take 150 mg by mouth 2 (two) times daily.   . Probiotic Product (ALIGN PO) Take 1 capsule by mouth daily.  . ramipril (ALTACE) 5 MG capsule TAKE (1) CAPSULE TWICE DAILY.  . rosuvastatin (CRESTOR) 10 MG tablet Take 10 mg by mouth at bedtime.   . vitamin B-12 (CYANOCOBALAMIN) 250 MCG tablet Take 250 mcg by mouth daily.     Allergies:   Codeine, Penicillins, Atenolol, Atorvastatin, Metoprolol, and Zetia [ezetimibe]   Social History   Socioeconomic History  . Marital status: Married    Spouse name: Jillyn Hidden  . Number of children: 3  . Years of education: 16  . Highest education level: Bachelor's degree (e.g., BA, AB, BS)  Occupational History  . Occupation: Retired  Tobacco Use  . Smoking status: Never Smoker  . Smokeless tobacco: Never Used  Vaping Use  . Vaping Use: Never used  Substance and Sexual Activity  . Alcohol use: No    Alcohol/week: 0.0 standard drinks  . Drug use: No  . Sexual activity: Not on file  Other Topics Concern  . Not on file  Social History Narrative   Patient is married with 3 children.   Patient is left handed.   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   Patient drinks 3 cups daily.   Social Determinants of Health   Financial Resource Strain:   . Difficulty of Paying Living Expenses: Not on file  Food Insecurity:   . Worried About Programme researcher, broadcasting/film/video in the Last Year: Not on file  . Ran Out of Food in the Last Year: Not on file  Transportation Needs:   . Lack of Transportation (Medical): Not on file  . Lack of Transportation (Non-Medical): Not on file  Physical Activity:   . Days of Exercise per Week: Not on file  . Minutes of Exercise per Session: Not on file  Stress:   . Feeling of Stress : Not on file  Social Connections:   . Frequency of Communication with Friends and Family: Not on file  . Frequency of  Social Gatherings with Friends and Family: Not on file  . Attends Religious Services: Not on file  . Active Member of Clubs or Organizations: Not on file  . Attends Banker Meetings: Not on file  . Marital Status: Not on file     Family History: The patient's family history includes Heart failure in her mother; Neuromuscular disorder in her father.  ROS:   Please see the history of present illness.    No new data all other systems reviewed and are negative.  EKGs/Labs/Other Studies Reviewed:  The following studies were reviewed today:  2D Doppler echocardiogram July 2021: IMPRESSIONS 1. Left ventricular ejection fraction, by estimation, is 60 to 65%. The  left ventricle has normal function. The left ventricle has no regional  wall motion abnormalities. There is severe left ventricular hypertrophy of  the basal-septal segment. Left  ventricular diastolic parameters are consistent with Grade I diastolic  dysfunction (impaired relaxation). Elevated left ventricular end-diastolic  pressure.  2. Right ventricular systolic function is normal. The right ventricular  size is normal.  3. The mitral valve is degenerative. Trivial mitral valve regurgitation.  No evidence of mitral stenosis.  4. The aortic valve is tricuspid. Aortic valve regurgitation is not  visualized. Mild to moderate aortic valve sclerosis/calcification is  present, without any evidence of aortic stenosis.  5. The inferior vena cava is normal in size with greater than 50%  respiratory variability, suggesting right atrial pressure of 3 mmHg.   EKG:  EKG not performed today.  The most recent tracing on June 26, 2020 demonstrated first-degree AV block, left bundle branch block.  Recent Labs: 06/26/2020: ALT 10; BUN 22; Creatinine, Ser 0.94; Hemoglobin 13.5; NT-Pro BNP 206; Platelets 184; Potassium 4.5; Sodium 140  Recent Lipid Panel    Component Value Date/Time   CHOL 137 08/05/2014 0453   TRIG  105 08/05/2014 0453   HDL 65 08/05/2014 0453   CHOLHDL 2.1 08/05/2014 0453   VLDL 21 08/05/2014 0453   LDLCALC 51 08/05/2014 0453    Physical Exam:    VS:  BP (!) 128/54   Pulse 74   Ht 5\' 2"  (1.575 m)   Wt 205 lb (93 kg)   SpO2 97%   BMI 37.49 kg/m     Wt Readings from Last 3 Encounters:  10/09/20 205 lb (93 kg)  06/26/20 197 lb 3.2 oz (89.4 kg)  10/13/19 182 lb (82.6 kg)     GEN: Elderly and obese. No acute distress HEENT: Normal NECK: No JVD. LYMPHATICS: No lymphadenopathy CARDIAC:  RRR without murmur, gallop, or edema. VASCULAR:  Normal Pulses. No bruits. RESPIRATORY:  Clear to auscultation without rales, wheezing or rhonchi  ABDOMEN: Soft, non-tender, non-distended, No pulsatile mass, MUSCULOSKELETAL: No deformity  SKIN: Warm and dry NEUROLOGIC:  Alert and oriented x 3 PSYCHIATRIC:  Normal affect   ASSESSMENT:    1. Coronary artery disease involving native coronary artery of native heart without angina pectoris   2. Chronic diastolic heart failure (HCC)   3. Long term current use of anticoagulant therapy   4. Essential hypertension   5. Left bundle branch block   6. Paroxysmal atrial fibrillation (HCC)   7. Educated about COVID-19 virus infection   8. Bilateral lower extremity edema    PLAN:    In order of problems listed above:  1. No anginal complaints.  No nitroglycerin use.  Secondary prevention needs to be strongly adhered to.  Aspirin has been dyspnea continued because of concomitant Pradaxa therapy related to paroxysmal atrial fibrillation. 2. Adding Aldactone/spironolactone 12.5 mg Monday, Wednesday, and Friday to slightly decrease intravascular volume and hopefully help peripheral edema which is likely related to RV diastolic dysfunction.  Have to be careful not to lower the blood pressure too much.  See dialogue below 3. Pradaxa without side effects. 4. Blood pressure control is excellent.  Diastolic pressure is almost too low.  May need to adjust  her medication regimen after adding Aldactone by decreasing carvedilol intensity.  We will continue to monitor blood pressure.  I rechecked  her blood pressure today and got 135/68 mmHg. 5. We did not reassess 6. She is on beta-blocker therapy to control rate and Pradaxa to prevent stroke.  Based on clinical exam she is in sinus rhythm. 7. She is vaccinated and practicing medication. 8. Bilateral lower extremity swelling, improved with compression stockings.  Add Aldactone 12.5 mg Monday Wednesday and Friday.  Notify us if improvement after 1 week.  If not, increase to daily.  At the end of 2 weeks check a Bmet.  If blood pressure gets too low decrease carvedilol to 12.5 mg twice daily.  Otherwise clinical follow-up in 6 months.   Medication Adjustments/Labs and Tests Ordered: Current medicines are reviewed at length with the patient today.  Concerns regarding medicines are outlined above.  Orders Placed This Encounter  Procedures  . Basic metabolic panel   Meds ordered this encounter  Medications  . spironolactone (ALDACTONE) 25 MG tablet    Sig: Take 0.5 tablets (12.5 mg total) by mouth every Monday, Wednesday, and Friday.    Dispense:  45 tablet    Refill:  3    Patient Instructions  Medication Instructions:  1) START Spironolactone 12.5mg  once daily on Monday, Wednesday and Friday.  If no improvement after a week, increase to 12.5mg  once daily and call me to let me know so I can send in a new prescription.   *If you need a refill on your cardiac medications before your next appointment, please call your pharmacy*   Lab Work: BMET in 2 weeks  If you have labs (blood work) drawn today and your tests are completely normal, you will receive your results only by: Marland Kitchen MyChart Message (if you have MyChart) OR . A paper copy in the mail If you have any lab test that is abnormal or we need to change your treatment, we will call you to review the  results.   Testing/Procedures: None   Follow-Up: At [facility], you and your health needs are our priority.  As part of our continuing mission to provide you with exceptional heart care, we have created designated Provider Care Teams.  These Care Teams include your primary Cardiologist (physician) and Advanced Practice Providers (APPs -  Physician Assistants and Nurse Practitioners) who all work together to provide you with the care you need, when you need it.  We recommend signing up for the patient portal called "MyChart".  Sign up information is provided on this After Visit Summary.  MyChart is used to connect with patients for Virtual Visits (Telemedicine).  Patients are able to view lab/test results, encounter notes, upcoming appointments, etc.  Non-urgent messages can be sent to your provider as well.   To learn more about what you can do with MyChart, go to [email or web address].    Your next appointment:   6 month(s)  The format for your next appointment:   In Person  Provider:   You may see Lesleigh Noe, MD or one of the following Advanced Practice Providers on your designated Care Team:    Norma Fredrickson, NP  Nada Boozer, NP  Georgie Chard, NP    Other Instructions      Signed, Lesleigh Noe, MD  10/09/2020 11:31 AM    [facility]

## 2020-10-09 ENCOUNTER — Ambulatory Visit (INDEPENDENT_AMBULATORY_CARE_PROVIDER_SITE_OTHER): Payer: Medicare Other | Admitting: Interventional Cardiology

## 2020-10-09 ENCOUNTER — Encounter: Payer: Self-pay | Admitting: Interventional Cardiology

## 2020-10-09 ENCOUNTER — Other Ambulatory Visit: Payer: Self-pay

## 2020-10-09 VITALS — BP 128/54 | HR 74 | Ht 62.0 in | Wt 205.0 lb

## 2020-10-09 DIAGNOSIS — I5032 Chronic diastolic (congestive) heart failure: Secondary | ICD-10-CM | POA: Diagnosis not present

## 2020-10-09 DIAGNOSIS — I2510 Atherosclerotic heart disease of native coronary artery without angina pectoris: Secondary | ICD-10-CM

## 2020-10-09 DIAGNOSIS — I447 Left bundle-branch block, unspecified: Secondary | ICD-10-CM | POA: Diagnosis not present

## 2020-10-09 DIAGNOSIS — I480 Paroxysmal atrial fibrillation: Secondary | ICD-10-CM

## 2020-10-09 DIAGNOSIS — I10 Essential (primary) hypertension: Secondary | ICD-10-CM

## 2020-10-09 DIAGNOSIS — Z7901 Long term (current) use of anticoagulants: Secondary | ICD-10-CM

## 2020-10-09 DIAGNOSIS — Z23 Encounter for immunization: Secondary | ICD-10-CM

## 2020-10-09 DIAGNOSIS — Z7189 Other specified counseling: Secondary | ICD-10-CM

## 2020-10-09 DIAGNOSIS — R600 Localized edema: Secondary | ICD-10-CM | POA: Diagnosis not present

## 2020-10-09 MED ORDER — SPIRONOLACTONE 25 MG PO TABS: 12.5000 mg | ORAL_TABLET | ORAL | 3 refills | Status: DC

## 2020-10-09 NOTE — Patient Instructions (Signed)
Medication Instructions:  1) START Spironolactone 12.5mg  once daily on Monday, Wednesday and Friday.  If no improvement after a week, increase to 12.5mg  once daily and call me to let me know so I can send in a new prescription.   *If you need a refill on your cardiac medications before your next appointment, please call your pharmacy*   Lab Work: BMET in 2 weeks  If you have labs (blood work) drawn today and your tests are completely normal, you will receive your results only by: Marland Kitchen MyChart Message (if you have MyChart) OR . A paper copy in the mail If you have any lab test that is abnormal or we need to change your treatment, we will call you to review the results.   Testing/Procedures: None   Follow-Up: At [facility], you and your health needs are our priority.  As part of our continuing mission to provide you with exceptional heart care, we have created designated Provider Care Teams.  These Care Teams include your primary Cardiologist (physician) and Advanced Practice Providers (APPs -  Physician Assistants and Nurse Practitioners) who all work together to provide you with the care you need, when you need it.  We recommend signing up for the patient portal called "MyChart".  Sign up information is provided on this After Visit Summary.  MyChart is used to connect with patients for Virtual Visits (Telemedicine).  Patients are able to view lab/test results, encounter notes, upcoming appointments, etc.  Non-urgent messages can be sent to your provider as well.   To learn more about what you can do with MyChart, go to [email or web address].    Your next appointment:   6 month(s)  The format for your next appointment:   In Person  Provider:   You may see Sinclair Grooms, MD or one of the following Advanced Practice Providers on your designated Care Team:    Truitt Merle, NP  Cecilie Kicks, NP  Kathyrn Drown, NP    Other Instructions

## 2020-10-24 ENCOUNTER — Other Ambulatory Visit: Payer: Self-pay

## 2020-10-24 ENCOUNTER — Other Ambulatory Visit: Payer: Medicare Other | Admitting: *Deleted

## 2020-10-24 DIAGNOSIS — I10 Essential (primary) hypertension: Secondary | ICD-10-CM

## 2020-10-24 DIAGNOSIS — I2510 Atherosclerotic heart disease of native coronary artery without angina pectoris: Secondary | ICD-10-CM

## 2020-10-24 LAB — BASIC METABOLIC PANEL
BUN/Creatinine Ratio: 20 (ref 12–28)
BUN: 22 mg/dL (ref 8–27)
CO2: 23 mmol/L (ref 20–29)
Calcium: 9.3 mg/dL (ref 8.7–10.3)
Chloride: 105 mmol/L (ref 96–106)
Creatinine, Ser: 1.08 mg/dL — ABNORMAL HIGH (ref 0.57–1.00)
GFR calc Af Amer: 54 mL/min/{1.73_m2} — ABNORMAL LOW (ref >59)
GFR calc non Af Amer: 47 mL/min/{1.73_m2} — ABNORMAL LOW (ref >59)
Glucose: 233 mg/dL — ABNORMAL HIGH (ref 65–99)
Potassium: 4.8 mmol/L (ref 3.5–5.2)
Sodium: 141 mmol/L (ref 134–144)

## 2020-10-25 ENCOUNTER — Other Ambulatory Visit: Payer: Self-pay

## 2020-10-25 MED ORDER — SPIRONOLACTONE 25 MG PO TABS: 12.5000 mg | ORAL_TABLET | Freq: Every day | ORAL | 3 refills | Status: DC

## 2020-10-25 NOTE — Telephone Encounter (Signed)
Pt calling stating that Dr. Thompson Caul nurse Anderson Malta, RN, told her to call back if she needed to take spironolactone daily. Pt stated that she take a half tablet daily now and she needs Dr. Tamala Julian to send in a new Rx for this medication to be taken daily. Please address

## 2020-10-25 NOTE — Telephone Encounter (Signed)
Spoke with pt and she states she took the Spironolactone 12.5mg  on MWF with no improvement after a week, so she increased to 12.5mg  QD as instructed and did have improvement.  Advised I will send in new prescription for the half tab daily.  Pt appreciative for call.

## 2020-10-27 ENCOUNTER — Other Ambulatory Visit: Payer: Self-pay | Admitting: Interventional Cardiology

## 2020-11-27 ENCOUNTER — Other Ambulatory Visit: Payer: Self-pay | Admitting: Interventional Cardiology

## 2020-11-28 DIAGNOSIS — Z8639 Personal history of other endocrine, nutritional and metabolic disease: Secondary | ICD-10-CM | POA: Diagnosis not present

## 2020-11-28 DIAGNOSIS — R609 Edema, unspecified: Secondary | ICD-10-CM | POA: Diagnosis not present

## 2020-11-28 DIAGNOSIS — E11319 Type 2 diabetes mellitus with unspecified diabetic retinopathy without macular edema: Secondary | ICD-10-CM | POA: Diagnosis not present

## 2020-11-28 DIAGNOSIS — D6869 Other thrombophilia: Secondary | ICD-10-CM | POA: Diagnosis not present

## 2020-11-28 DIAGNOSIS — L84 Corns and callosities: Secondary | ICD-10-CM | POA: Diagnosis not present

## 2020-11-28 DIAGNOSIS — I519 Heart disease, unspecified: Secondary | ICD-10-CM | POA: Diagnosis not present

## 2020-11-28 DIAGNOSIS — I480 Paroxysmal atrial fibrillation: Secondary | ICD-10-CM | POA: Diagnosis not present

## 2020-11-28 DIAGNOSIS — Z1389 Encounter for screening for other disorder: Secondary | ICD-10-CM | POA: Diagnosis not present

## 2020-11-28 DIAGNOSIS — Z794 Long term (current) use of insulin: Secondary | ICD-10-CM | POA: Diagnosis not present

## 2020-11-28 DIAGNOSIS — Z0000 Encounter for general adult medical examination without abnormal findings: Secondary | ICD-10-CM | POA: Diagnosis not present

## 2020-11-28 DIAGNOSIS — I10 Essential (primary) hypertension: Secondary | ICD-10-CM | POA: Diagnosis not present

## 2020-12-12 ENCOUNTER — Ambulatory Visit (INDEPENDENT_AMBULATORY_CARE_PROVIDER_SITE_OTHER): Payer: Medicare Other | Admitting: Podiatry

## 2020-12-12 ENCOUNTER — Other Ambulatory Visit: Payer: Self-pay

## 2020-12-12 DIAGNOSIS — E118 Type 2 diabetes mellitus with unspecified complications: Secondary | ICD-10-CM

## 2020-12-12 DIAGNOSIS — L84 Corns and callosities: Secondary | ICD-10-CM

## 2020-12-12 DIAGNOSIS — Z7901 Long term (current) use of anticoagulants: Secondary | ICD-10-CM

## 2020-12-12 DIAGNOSIS — I2510 Atherosclerotic heart disease of native coronary artery without angina pectoris: Secondary | ICD-10-CM

## 2020-12-14 NOTE — Progress Notes (Signed)
Subjective:   Patient ID: [name], female   DOB: 86 y.o.   MRN: [id number]   HPI 86 year old female presents the office today for concerns of corn on the left 4th toe.  She says that she wears compression socks and puts pressure on the toes which is resulted in the corn.  Area is tender.  She does have chronic edema to her legs.  No open sores.  No drainage.  No other concerns.  Her last A1c was 8.3.   Review of Systems  All other systems reviewed and are negative.  Past Medical History:  Diagnosis Date  . Anginal pain ([place])    "saw doctor-think it was esophagus spasm"  . Arthritis   . Atrial flutter ([place]) 11/02/2013  . Cataract    left eye  . Coronary artery disease   . Diabetes mellitus without complication ([place])    type 2  . Essential hypertension, benign 11/02/2013  . H/O jaundice    as child  . Headache 6295   complicated migraine x2   . Hearing trouble   . Heart disease   . Hepatitis    hx of hepatitis A as child  . High cholesterol    under control  . Long term (current) use of anticoagulants 11/02/2013   Pradaxa   . Measles    as child  . Mouth dryness   . Mumps    as teenager  . Numbness and tingling    lips  . Osteoporosis    "unsure-maybe in knees"  . Psoriasis   . Psoriatic arthritis ([place])   . Stroke ([place]) 2012  . Vertigo    being monitored, occasional    Past Surgical History:  Procedure Laterality Date  . APPENDECTOMY  as child  . CARDIAC CATHETERIZATION  ~2006   clear  . CORONARY ARTERY BYPASS GRAFT  02/16/2005  . LUMBAR DISC SURGERY  09/2004   L1-L5  . TONSILLECTOMY  as child   with adenoids  . TOTAL HIP ARTHROPLASTY Left 04/19/2019   Procedure: TOTAL HIP ARTHROPLASTY ANTERIOR APPROACH;  Surgeon: Gaynelle Arabian, MD;  Location: [facility];  Service: Orthopedics;  Laterality: Left;  . TOTAL KNEE ARTHROPLASTY Left 09/11/2015   Procedure: LEFT TOTAL KNEE ARTHROPLASTY;  Surgeon: Gaynelle Arabian, MD;  Location: [facility];  Service: Orthopedics;   Laterality: Left;     Current Outpatient Medications:  .  acetaminophen (TYLENOL) 500 MG tablet, Take 500 mg by mouth every 6 (six) hours as needed for mild pain., Disp: , Rfl:  .  calcium elemental as carbonate (TUMS ULTRA 1000) 400 MG chewable tablet, Chew 1,000 mg by mouth every evening., Disp: , Rfl:  .  carvedilol (COREG) 25 MG tablet, TAKE (1) TABLET TWICE A DAY WITH FOOD., Disp: 180 tablet, Rfl: 1 .  cholecalciferol (VITAMIN D) 1000 units tablet, Take 2,000 Units by mouth daily., Disp: , Rfl:  .  clobetasol cream (TEMOVATE) 2.84 %, Apply 1 application topically 2 (two) times daily as needed (skin irritation)., Disp: , Rfl:  .  clonazePAM (KLONOPIN) 0.5 MG tablet, Take 0.5 mg by mouth at bedtime as needed (for sleep). , Disp: , Rfl:  .  ENBREL 50 MG/ML injection, Inject 50 mg into the skin once a week. , Disp: , Rfl:  .  HUMALOG MIX 50/50 KWIKPEN (50-50) 100 UNIT/ML Kwikpen, Inject into the skin 2 (two) times daily. 56 Units in AM; 28 Units in PM, Disp: , Rfl:  .  Loratadine (CLARITIN PO), Take 1 tablet  by mouth daily., Disp: , Rfl:  .  nitroGLYCERIN (NITROSTAT) 0.4 MG SL tablet, Place 1 tablet (0.4 mg total) under the tongue every 5 (five) minutes x 3 doses as needed for chest pain., Disp: 25 tablet, Rfl: 3 .  nystatin-triamcinolone ointment (MYCOLOG), Apply 1 application topically as directed. , Disp: , Rfl: 0 .  polyethylene glycol (MIRALAX / GLYCOLAX) 17 g packet, Take 17 g by mouth daily., Disp: , Rfl:  .  PRADAXA 150 MG CAPS, Take 150 mg by mouth 2 (two) times daily. , Disp: , Rfl:  .  Probiotic Product (ALIGN PO), Take 1 capsule by mouth daily., Disp: , Rfl:  .  ramipril (ALTACE) 5 MG capsule, TAKE (1) CAPSULE TWICE DAILY., Disp: 180 capsule, Rfl: 1 .  rosuvastatin (CRESTOR) 10 MG tablet, Take 10 mg by mouth at bedtime. , Disp: , Rfl:  .  spironolactone (ALDACTONE) 25 MG tablet, Take 0.5 tablets (12.5 mg total) by mouth daily., Disp: 45 tablet, Rfl: 3 .  vitamin B-12  (CYANOCOBALAMIN) 250 MCG tablet, Take 250 mcg by mouth daily., Disp: , Rfl:   Allergies  Allergen Reactions  . Codeine Nausea And Vomiting  . Penicillins Swelling and Other (See Comments)    Did it involve swelling of the face/tongue/throat, SOB, or low BP? Yes Did it involve sudden or severe rash/hives, skin peeling, or any reaction on the inside of your mouth or nose? Unknown Did you need to seek medical attention at a hospital or doctor's office? Unknown When did it last happen?Unknown If all above answers are "NO", may proceed with cephalosporin use.  Swelling of tongue  . Atenolol Other (See Comments)  . Atorvastatin     Muscle soreness  . Metoprolol Other (See Comments)  . Zetia [Ezetimibe] Other (See Comments)         Objective:  Physical Exam  General: AAO x3, NAD  Dermatological: Hyperkeratotic lesion lateral 4th toe.  No underlying ulceration drainage or signs of infection.  No open lesions otherwise.  There are no open sores, no preulcerative lesions, no rash or signs of infection present.  Vascular: Dorsalis Pedis artery and Posterior Tibial artery pedal pulses are 1/4 bilateral with immedate capillary fill time.  Chronic edema present bilaterally. There is no pain with calf compression, warmth, erythema.   Neruologic: Grossly intact via light touch bilateral.   Musculoskeletal: Hammertoes, adductovarus noted. Muscular strength 5/5 in all groups tested bilateral.  Gait: Unassisted, Nonantalgic.       Assessment:   Preulcerative callus due to digital deformity     Plan:  -Treatment options discussed including all alternatives, risks, and complications -Etiology of symptoms were discussed -Patient debrided without complications or bleeding.  Offloading pads were dispensed.  Trula Slade DPM

## 2020-12-28 IMAGING — RF OPERATIVE LEFT HIP WITH PELVIS
1 series · 2 of 2 positions shown · non-contrast
Comparison: 04/17/2027 left hip radiographs

CLINICAL DATA: Repair of left femoral neck fracture

EXAM:
OPERATIVE LEFT HIP (WITH PELVIS IF PERFORMED) 2 VIEWS
TECHNIQUE: Fluoroscopic spot image(s) were submitted for interpretation
post-operatively.

[Series 1: unknown protocol · 0.20mm/px · 2 of 2 slices shown]
[im 1/2]
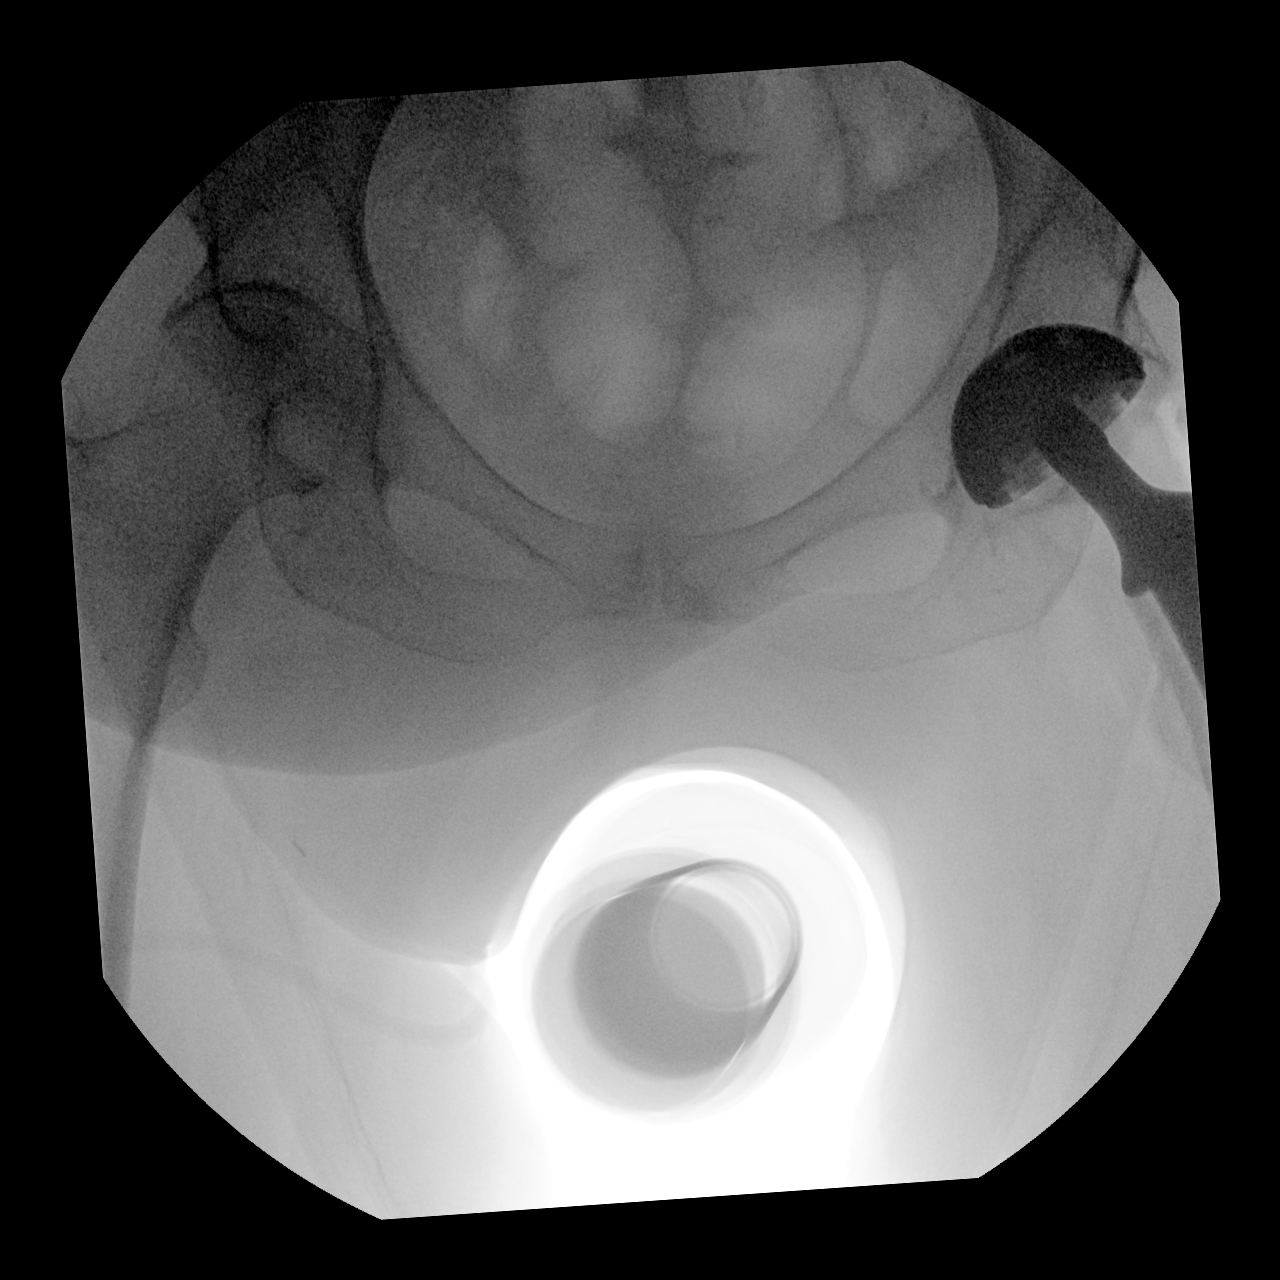
[im 2/2]
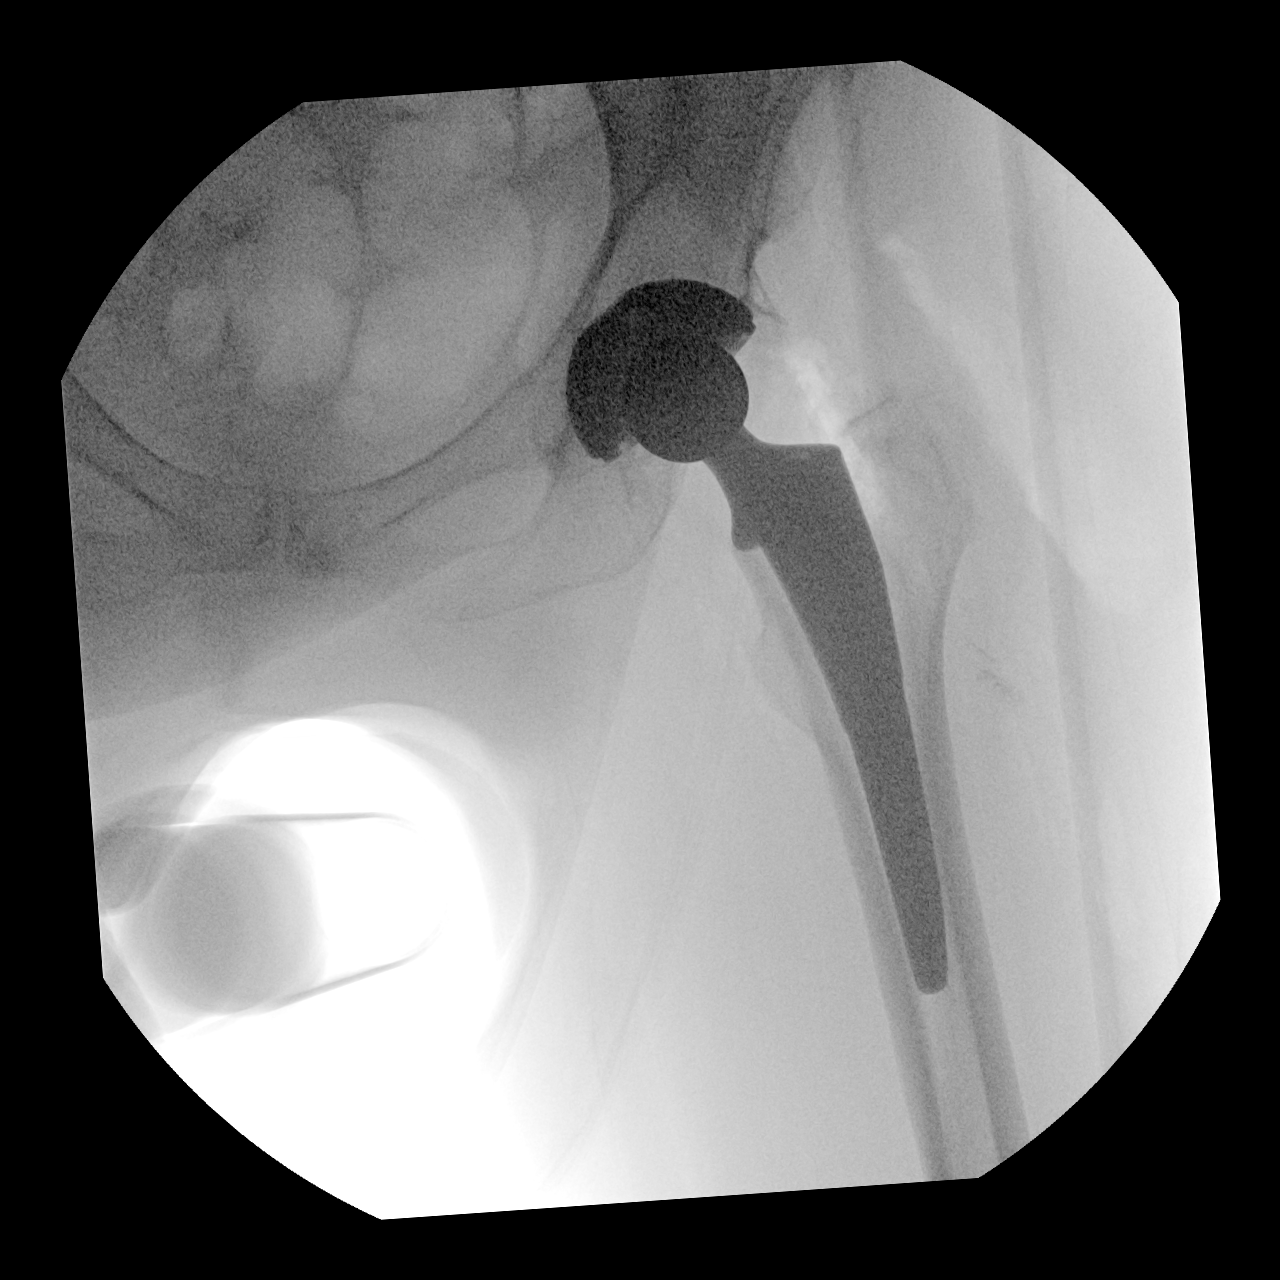

[2 of 2 positions shown; findings below may reference images not displayed]

FINDINGS: Fluoroscopy time 0 minutes 7 seconds. Two spot fluoroscopic
intraoperative radiographs demonstrate postsurgical changes from
left total hip arthroplasty with no evidence of left hip
malalignment on these views.
IMPRESSION: Intraoperative fluoroscopic guidance for left total hip
arthroplasty.

## 2021-01-02 DIAGNOSIS — E11319 Type 2 diabetes mellitus with unspecified diabetic retinopathy without macular edema: Secondary | ICD-10-CM | POA: Diagnosis not present

## 2021-01-02 DIAGNOSIS — Z794 Long term (current) use of insulin: Secondary | ICD-10-CM | POA: Diagnosis not present

## 2021-01-02 DIAGNOSIS — Z8639 Personal history of other endocrine, nutritional and metabolic disease: Secondary | ICD-10-CM | POA: Diagnosis not present

## 2021-01-02 DIAGNOSIS — I2510 Atherosclerotic heart disease of native coronary artery without angina pectoris: Secondary | ICD-10-CM | POA: Diagnosis not present

## 2021-01-26 DIAGNOSIS — E11319 Type 2 diabetes mellitus with unspecified diabetic retinopathy without macular edema: Secondary | ICD-10-CM | POA: Diagnosis not present

## 2021-02-20 DIAGNOSIS — L4059 Other psoriatic arthropathy: Secondary | ICD-10-CM | POA: Diagnosis not present

## 2021-02-20 DIAGNOSIS — M150 Primary generalized (osteo)arthritis: Secondary | ICD-10-CM | POA: Diagnosis not present

## 2021-02-20 DIAGNOSIS — Z79899 Other long term (current) drug therapy: Secondary | ICD-10-CM | POA: Diagnosis not present

## 2021-02-20 DIAGNOSIS — Z6837 Body mass index (BMI) 37.0-37.9, adult: Secondary | ICD-10-CM | POA: Diagnosis not present

## 2021-02-20 DIAGNOSIS — M2550 Pain in unspecified joint: Secondary | ICD-10-CM | POA: Diagnosis not present

## 2021-02-20 DIAGNOSIS — E669 Obesity, unspecified: Secondary | ICD-10-CM | POA: Diagnosis not present

## 2021-02-20 DIAGNOSIS — L409 Psoriasis, unspecified: Secondary | ICD-10-CM | POA: Diagnosis not present

## 2021-02-28 ENCOUNTER — Other Ambulatory Visit: Payer: Self-pay

## 2021-02-28 ENCOUNTER — Ambulatory Visit (INDEPENDENT_AMBULATORY_CARE_PROVIDER_SITE_OTHER): Payer: Medicare Other | Admitting: Podiatry

## 2021-02-28 DIAGNOSIS — M79675 Pain in left toe(s): Secondary | ICD-10-CM | POA: Diagnosis not present

## 2021-02-28 DIAGNOSIS — L989 Disorder of the skin and subcutaneous tissue, unspecified: Secondary | ICD-10-CM

## 2021-02-28 DIAGNOSIS — M79672 Pain in left foot: Secondary | ICD-10-CM

## 2021-03-04 NOTE — Progress Notes (Signed)
   Subjective: 87 y.o. female presenting to the office today for follow-up evaluation of pain and tenderness to the left fourth toe secondary to a corn/callus that is developed.  Patient states is very painful in shoe gear.  She has been here to the practice before for the same condition.  She presents for further treatment and evaluation   Past Medical History:  Diagnosis Date  . Anginal pain ([place])    "saw doctor-think it was esophagus spasm"  . Arthritis   . Atrial flutter ([place]) 11/02/2013  . Cataract    left eye  . Coronary artery disease   . Diabetes mellitus without complication ([place])    type 2  . Essential hypertension, benign 11/02/2013  . H/O jaundice    as child  . Headache 0076   complicated migraine x2   . Hearing trouble   . Heart disease   . Hepatitis    hx of hepatitis A as child  . High cholesterol    under control  . Long term (current) use of anticoagulants 11/02/2013   Pradaxa   . Measles    as child  . Mouth dryness   . Mumps    as teenager  . Numbness and tingling    lips  . Osteoporosis    "unsure-maybe in knees"  . Psoriasis   . Psoriatic arthritis ([place])   . Stroke ([place]) 2012  . Vertigo    being monitored, occasional     Objective:  Physical Exam General: Alert and oriented x3 in no acute distress  Dermatology: Hyperkeratotic lesion(s) present on the left fourth toe. Pain on palpation with a central nucleated core noted. Skin is warm, dry and supple bilateral lower extremities. Negative for open lesions or macerations.  Vascular: Palpable pedal pulses bilaterally. No edema or erythema noted. Capillary refill within normal limits.  Neurological: Epicritic and protective threshold grossly intact bilaterally.   Musculoskeletal Exam: Pain on palpation at the keratotic lesion(s) noted. Range of motion within normal limits bilateral. Muscle strength 5/5 in all groups bilateral.  Assessment: 1.  Symptomatic corn left fourth toe   Plan of Care:   1. Patient evaluated 2. Excisional debridement of keratoic lesion(s) using a chisel blade was performed without incident.  3.  Recommend OTC corn callus remover 4.  Silicone toe spacers were provided.  Wear daily. 5.  Recommend shoes that are wide fitting and to allow plenty of room in the toebox 6.  Return to clinic as needed  Edrick Kins, DPM [facility]  Dr. Edrick Kins, DPM    [address], [place] [address]                Office [phone]  Fax [phone]

## 2021-03-13 DIAGNOSIS — D6869 Other thrombophilia: Secondary | ICD-10-CM | POA: Diagnosis not present

## 2021-03-13 DIAGNOSIS — I480 Paroxysmal atrial fibrillation: Secondary | ICD-10-CM | POA: Diagnosis not present

## 2021-03-13 DIAGNOSIS — E11319 Type 2 diabetes mellitus with unspecified diabetic retinopathy without macular edema: Secondary | ICD-10-CM | POA: Diagnosis not present

## 2021-03-13 DIAGNOSIS — I10 Essential (primary) hypertension: Secondary | ICD-10-CM | POA: Diagnosis not present

## 2021-03-13 DIAGNOSIS — L4050 Arthropathic psoriasis, unspecified: Secondary | ICD-10-CM | POA: Diagnosis not present

## 2021-03-13 DIAGNOSIS — R300 Dysuria: Secondary | ICD-10-CM | POA: Diagnosis not present

## 2021-04-03 DIAGNOSIS — Z794 Long term (current) use of insulin: Secondary | ICD-10-CM | POA: Diagnosis not present

## 2021-04-03 DIAGNOSIS — E11319 Type 2 diabetes mellitus with unspecified diabetic retinopathy without macular edema: Secondary | ICD-10-CM | POA: Diagnosis not present

## 2021-04-03 DIAGNOSIS — Z8639 Personal history of other endocrine, nutritional and metabolic disease: Secondary | ICD-10-CM | POA: Diagnosis not present

## 2021-04-03 DIAGNOSIS — I2510 Atherosclerotic heart disease of native coronary artery without angina pectoris: Secondary | ICD-10-CM | POA: Diagnosis not present

## 2021-05-08 DIAGNOSIS — Z23 Encounter for immunization: Secondary | ICD-10-CM | POA: Diagnosis not present

## 2021-05-17 ENCOUNTER — Other Ambulatory Visit: Payer: Self-pay | Admitting: Interventional Cardiology

## 2021-06-28 NOTE — Progress Notes (Signed)
Cardiology Office Note:    Date:  06/29/2021   ID:  [name], DOB [birth year], MRN [id number]  PCP:  [name], MD  Cardiologist:  Lesleigh Noe, MD   Referring MD: [name], MD   Chief Complaint  Patient presents with   Loss of Consciousness   Congestive Heart Failure   Atrial Fibrillation     History of Present Illness:    [name] is a 87 y.o. female with a hx of coronary artery disease status post CABG in 2006, paroxysmal atrial fibrillation on chronic anticoagulation with dabigatran, rheumatoid arthritis, diabetes, hypertension, prior stroke, complicated migraines, now c/o LEE and dyspnea for 3 weeks. Started on furosemide 20 mg daily but we discontinued because swelling led to AKI.   Shiona feels well.  She and her daughter-in-law tell me about 3 episodes that occurred earlier this month.  She was sitting, would become very weak, turned pale, and feel as though she might faint.  There was no associated nausea.  There was no diaphoresis.  A daughter who is a nurse felt her pulse and it was not excessively slow.  There was no other associated symptom.  No chest pain, dyspnea, or neurological complaints.  She has not had a recurrent episode in over 2 weeks.  Past Medical History:  Diagnosis Date   Anginal pain ([facility])    "saw doctor-think it was esophagus spasm"   Arthritis    Atrial flutter ([facility]) 11/02/2013   Cataract    left eye   Coronary artery disease    Diabetes mellitus without complication (H[facility])    type 2   Essential hypertension, benign 11/02/2013   H/O jaundice    as child   Headache 2015   complicated migraine x2    Hearing trouble    Heart disease    Hepatitis    hx of hepatitis A as child   High cholesterol    under control   Long term (current) use of anticoagulants 11/02/2013   Pradaxa    Measles    as child   Mouth dryness    Mumps    as teenager   Numbness and tingling    lips   Osteoporosis    "unsure-maybe in knees"    Psoriasis    Psoriatic arthritis (HCC)    Stroke ([facility]) 2012   Vertigo    being monitored, occasional    Past Surgical History:  Procedure Laterality Date   APPENDECTOMY  as child   CARDIAC CATHETERIZATION  ~2006   clear   CORONARY ARTERY BYPASS GRAFT  02/16/2005   LUMBAR DISC SURGERY  09/2004   L1-L5   TONSILLECTOMY  as child   with adenoids   TOTAL HIP ARTHROPLASTY Left 04/19/2019   Procedure: TOTAL HIP ARTHROPLASTY ANTERIOR APPROACH;  Surgeon: Ollen Gross, MD;  Location: [facility];  Service: Orthopedics;  Laterality: Left;   TOTAL KNEE ARTHROPLASTY Left 09/11/2015   Procedure: LEFT TOTAL KNEE ARTHROPLASTY;  Surgeon: Ollen Gross, MD;  Location: [facility];  Service: Orthopedics;  Laterality: Left;    Current Medications: Current Meds  Medication Sig   acetaminophen (TYLENOL) 500 MG tablet Take 500 mg by mouth every 6 (six) hours as needed for mild pain.   calcium elemental as carbonate (BARIATRIC TUMS ULTRA) 400 MG chewable tablet Chew 1,000 mg by mouth every evening.   carvedilol (COREG) 12.5 MG tablet Take 1 tablet (12.5 mg total) by mouth 2 (two) times daily.   cholecalciferol (VITAMIN D)  1000 units tablet Take 2,000 Units by mouth daily.   clobetasol cream (TEMOVATE) 0.05 % Apply 1 application topically 2 (two) times daily as needed (skin irritation).   clonazePAM (KLONOPIN) 0.5 MG tablet Take 0.5 mg by mouth at bedtime as needed (for sleep).    ENBREL 50 MG/ML injection Inject 50 mg into the skin once a week.    HUMALOG MIX 50/50 KWIKPEN (50-50) 100 UNIT/ML Kwikpen Inject into the skin 2 (two) times daily. 56 Units in AM; 28 Units in PM   Loratadine (CLARITIN PO) Take 1 tablet by mouth daily.   polyethylene glycol (MIRALAX / GLYCOLAX) 17 g packet Take 17 g by mouth daily.   PRADAXA 150 MG CAPS Take 150 mg by mouth 2 (two) times daily.    Probiotic Product (ALIGN PO) Take 1 capsule by mouth daily.   ramipril (ALTACE) 5 MG capsule TAKE (1) CAPSULE TWICE DAILY.   rosuvastatin  (CRESTOR) 10 MG tablet Take 10 mg by mouth at bedtime.    vitamin B-12 (CYANOCOBALAMIN) 250 MCG tablet Take 250 mcg by mouth daily.   [DISCONTINUED] carvedilol (COREG) 25 MG tablet TAKE (1) TABLET TWICE A DAY WITH FOOD.   [DISCONTINUED] nitroGLYCERIN (NITROSTAT) 0.4 MG SL tablet Place 1 tablet (0.4 mg total) under the tongue every 5 (five) minutes x 3 doses as needed for chest pain.   [DISCONTINUED] nystatin-triamcinolone ointment (MYCOLOG) Apply 1 application topically as directed.    [DISCONTINUED] spironolactone (ALDACTONE) 25 MG tablet Take 0.5 tablets (12.5 mg total) by mouth daily.     Allergies:   Codeine, Penicillins, Atenolol, Atorvastatin, Metoprolol, and Zetia [ezetimibe]   Social History   Socioeconomic History   Marital status: Married    Spouse name: Jillyn Hidden   Number of children: 3   Years of education: 16   Highest education level: Bachelor's degree (e.g., BA, AB, BS)  Occupational History   Occupation: Retired  Tobacco Use   Smoking status: Never   Smokeless tobacco: Never  Vaping Use   Vaping Use: Never used  Substance and Sexual Activity   Alcohol use: No    Alcohol/week: 0.0 standard drinks   Drug use: No   Sexual activity: Not on file  Other Topics Concern   Not on file  Social History Narrative   Patient is married with 3 children.   Patient is left handed.   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   Patient drinks 3 cups daily.   Social Determinants of Health   Financial Resource Strain: Not on file  Food Insecurity: Not on file  Transportation Needs: Not on file  Physical Activity: Not on file  Stress: Not on file  Social Connections: Not on file     Family History: The patient's family history includes Heart failure in her mother; Neuromuscular disorder in her father.  ROS:   Please see the history of present illness.    Hard of hearing.  Decreased memory.  States that she is having a hard time remembering things that she wants  could.  All other systems reviewed and are negative.  EKGs/Labs/Other Studies Reviewed:    The following studies were reviewed today: No recent cardiac imaging.  EKG:  EKG sinus rhythm, left bundle branch block, QRS duration 140 ms and first-degree AV block 222 ms..  Recent Labs: 10/24/2020: BUN 22; Creatinine, Ser 1.08; Potassium 4.8; Sodium 141  Recent Lipid Panel    Component Value Date/Time   CHOL 137 08/05/2014 0453   TRIG 105  08/05/2014 0453   HDL 65 08/05/2014 0453   CHOLHDL 2.1 08/05/2014 0453   VLDL 21 08/05/2014 0453   LDLCALC 51 08/05/2014 0453    Physical Exam:    VS:  BP 100/62   Pulse 65   Ht 5\' 2"  (1.575 m)   Wt 203 lb 6.4 oz (92.3 kg)   SpO2 95%   BMI 37.20 kg/m     Wt Readings from Last 3 Encounters:  06/29/21 203 lb 6.4 oz (92.3 kg)  10/09/20 205 lb (93 kg)  06/26/20 197 lb 3.2 oz (89.4 kg)     GEN: Obese.  Mildly pale.. No acute distress HEENT: Normal NECK: No JVD. LYMPHATICS: No lymphadenopathy CARDIAC: No murmur. RRR no gallop, or edema. VASCULAR:  Normal Pulses. No bruits. RESPIRATORY:  Clear to auscultation without rales, wheezing or rhonchi  ABDOMEN: Soft, non-tender, non-distended, No pulsatile mass, MUSCULOSKELETAL: No deformity  SKIN: Warm and dry NEUROLOGIC:  Alert and oriented x 3 PSYCHIATRIC:  Normal affect   ASSESSMENT:    1. Near syncope   2. Chronic diastolic heart failure (HCC)   3. Coronary artery disease involving native coronary artery of native heart without angina pectoris   4. Long term current use of anticoagulant therapy   5. Essential hypertension   6. Left bundle branch block   7. Paroxysmal atrial fibrillation (HCC)    PLAN:    In order of problems listed above:  Uncertain etiology.  Considerations include possible vasovagal syncope, relative hypotension secondary to excess medication doses at her age.  She also has conduction system disease with left bundle, first-degree AV block.  Cannot exclude the  possibility of transient high-grade AV block.  Given today's blood pressures initially after walking and 100/62 mmHg and 128/60 mmHg after sitting for a while, have decided to decrease carvedilol to 12.5 mg twice daily.  Perform a 30-day monitor to exclude the possibility of excessive bradycardia or high-grade AV block.  Target blood pressure for her age category is 140/80 mmHg. There is no evidence of volume overload. She is not having symptoms of angina. Continue Pradaxa. Blood pressure is low enough that she could be having relative cerebral hypoperfusion.  We would like to keep her systolic blood pressure above 433 and less than 150.  Target is 140/80 mmHg. Noted Not currently in atrial fit.  The monitor will help exclude recurrence.  51-month follow-up.   Medication Adjustments/Labs and Tests Ordered: Current medicines are reviewed at length with the patient today.  Concerns regarding medicines are outlined above.  Orders Placed This Encounter  Procedures   Cardiac event monitor   EKG 12-Lead   Meds ordered this encounter  Medications   spironolactone (ALDACTONE) 25 MG tablet    Sig: Take 0.5 tablets (12.5 mg total) by mouth daily.    Dispense:  45 tablet    Refill:  3    Change in frequency   nitroGLYCERIN (NITROSTAT) 0.4 MG SL tablet    Sig: Place 1 tablet (0.4 mg total) under the tongue every 5 (five) minutes x 3 doses as needed for chest pain.    Dispense:  25 tablet    Refill:  3   carvedilol (COREG) 12.5 MG tablet    Sig: Take 1 tablet (12.5 mg total) by mouth 2 (two) times daily.    Dispense:  180 tablet    Refill:  3    Dose change    Patient Instructions  Medication Instructions:  1) DECREASE Carvedilol to 12.5mg  twice daily  *  If you need a refill on your cardiac medications before your next appointment, please call your pharmacy*   Lab Work: None If you have labs (blood work) drawn today and your tests are completely normal, you will receive your results  only by: MyChart Message (if you have MyChart) OR A paper copy in the mail If you have any lab test that is abnormal or we need to change your treatment, we will call you to review the results.   Testing/Procedures: Your physician has recommended that you wear an event monitor. Event monitors are medical devices that record the heart's electrical activity. Doctors most often [place] these monitors to diagnose arrhythmias. Arrhythmias are problems with the speed or rhythm of the heartbeat. The monitor is a small, portable device. You can wear one while you do your normal daily activities. This is usually used to diagnose what is causing palpitations/syncope (passing out).   Follow-Up: At [facility], you and your health needs are our priority.  As part of our continuing mission to provide you with exceptional heart care, we have created designated Provider Care Teams.  These Care Teams include your primary Cardiologist (physician) and Advanced Practice Providers (APPs -  Physician Assistants and Nurse Practitioners) who all work together to provide you with the care you need, when you need it.  We recommend signing up for the patient portal called "MyChart".  Sign up information is provided on this After Visit Summary.  MyChart is used to connect with patients for Virtual Visits (Telemedicine).  Patients are able to view lab/test results, encounter notes, upcoming appointments, etc.  Non-urgent messages can be sent to your provider as well.   To learn more about what you can do with MyChart, go to [email or web address].    Your next appointment:   6 month(s)  The format for your next appointment:   In Person  Provider:   You may see Lesleigh Noe, MD or one of the following Advanced Practice Providers on your designated Care Team:   Georgie Chard, NP   Other Instructions  [employer] Cardiac Event Monitor Instructions Your physician has requested you wear your cardiac event monitor  for 30 days. [employer] may call or text to confirm a shipping address. The monitor will be sent to a land address via UPS. [employer] will not ship a monitor to a PO BOX. It typically takes 3-5 days to receive your monitor after it has been enrolled. [employer] will assist with USPS tracking if your package is delayed. The telephone number for [employer] is [phone]. Once you have received your monitor, please review the enclosed instructions. Instruction tutorials can also be viewed under help and settings on the enclosed cell phone. Your monitor has already been registered assigning a specific monitor serial # to you.  Applying the monitor Remove cell phone from case and turn it on. The cell phone works as IT consultant and needs to be within [employer] of you at all times. The cell phone will need to be charged on a daily basis. We recommend you plug the cell phone into the enclosed charger at your bedside table every night.  Monitor batteries: You will receive two monitor batteries labelled #1 and #2. These are your recorders. Plug battery #2 onto the second connection on the enclosed charger. Keep one battery on the charger at all times. This will keep the monitor battery deactivated. It will also keep it fully charged for when you need to switch your monitor batteries. A  small light will be blinking on the battery emblem when it is charging. The light on the battery emblem will remain on when the battery is fully charged.  Open package of a Monitor strip. Insert battery #1 into black hood on strip and gently squeeze monitor battery onto connection as indicated in instruction booklet. Set aside while preparing skin.  Choose location for your strip, vertical or horizontal, as indicated in the instruction booklet. Shave to remove all hair from location. There cannot be any lotions, oils, powders, or colognes on skin where monitor is to be applied. Wipe skin clean with enclosed  Saline wipe. Dry skin completely.  Peel paper labeled #1 off the back of the Monitor strip exposing the adhesive. Place the monitor on the chest in the vertical or horizontal position shown in the instruction booklet. One arrow on the monitor strip must be pointing upward. Carefully remove paper labeled #2, attaching remainder of strip to your skin. Try not to create any folds or wrinkles in the strip as you apply it.  Firmly press and release the circle in the center of the monitor battery. You will hear a small beep. This is turning the monitor battery on. The heart emblem on the monitor battery will light up every 5 seconds if the monitor battery in turned on and connected to the patient securely. Do not push and hold the circle down as this turns the monitor battery off. The cell phone will locate the monitor battery. A screen will appear on the cell phone checking the connection of your monitor strip. This may read poor connection initially but change to good connection within the next minute. Once your monitor accepts the connection you will hear a series of 3 beeps followed by a climbing crescendo of beeps. A screen will appear on the cell phone showing the two monitor strip placement options. Touch the picture that demonstrates where you applied the monitor strip.  Your monitor strip and battery are waterproof. You are able to shower, bathe, or swim with the monitor on. They just ask you do not submerge deeper than 3 feet underwater. We recommend removing the monitor if you are swimming in a lake, river, or ocean.  Your monitor battery will need to be switched to a fully charged monitor battery approximately once a week. The cell phone will alert you of an action which needs to be made.  On the cell phone, tap for details to reveal connection status, monitor battery status, and cell phone battery status. The green dots indicates your monitor is in good status. A red dot indicates  there is something that needs your attention.  To record a symptom, click the circle on the monitor battery. In 30-60 seconds a list of symptoms will appear on the cell phone. Select your symptom and tap save. Your monitor will record a sustained or significant arrhythmia regardless of you clicking the button. Some patients do not feel the heart rhythm irregularities. [employer] will notify us of any serious or critical events.  Refer to instruction booklet for instructions on switching batteries, changing strips, the Do not disturb or Pause features, or any additional questions.  Call [employer] at [phone], to confirm your monitor is transmitting and record your baseline. They will answer any questions you may have regarding the monitor instructions at that time.  Returning the monitor to [employer] Place all equipment back into blue box. Peel off strip of paper to expose adhesive and close box securely. There is a  prepaid UPS shipping label on this box. Drop in a UPS drop box, or at a UPS facility like [employer]. You may also contact [employer] to arrange UPS to pick up monitor package at your home.     Signed, Lesleigh Noe, MD  06/29/2021 3:54 PM    [facility]

## 2021-06-29 ENCOUNTER — Encounter: Payer: Self-pay | Admitting: Interventional Cardiology

## 2021-06-29 ENCOUNTER — Ambulatory Visit (INDEPENDENT_AMBULATORY_CARE_PROVIDER_SITE_OTHER): Payer: Medicare Other | Admitting: Interventional Cardiology

## 2021-06-29 ENCOUNTER — Other Ambulatory Visit: Payer: Self-pay

## 2021-06-29 ENCOUNTER — Encounter: Payer: Self-pay | Admitting: Radiology

## 2021-06-29 VITALS — BP 100/62 | HR 65 | Ht 62.0 in | Wt 203.4 lb

## 2021-06-29 DIAGNOSIS — I447 Left bundle-branch block, unspecified: Secondary | ICD-10-CM | POA: Diagnosis not present

## 2021-06-29 DIAGNOSIS — R55 Syncope and collapse: Secondary | ICD-10-CM

## 2021-06-29 DIAGNOSIS — I2510 Atherosclerotic heart disease of native coronary artery without angina pectoris: Secondary | ICD-10-CM

## 2021-06-29 DIAGNOSIS — Z7901 Long term (current) use of anticoagulants: Secondary | ICD-10-CM | POA: Diagnosis not present

## 2021-06-29 DIAGNOSIS — I480 Paroxysmal atrial fibrillation: Secondary | ICD-10-CM

## 2021-06-29 DIAGNOSIS — I10 Essential (primary) hypertension: Secondary | ICD-10-CM | POA: Diagnosis not present

## 2021-06-29 DIAGNOSIS — I5032 Chronic diastolic (congestive) heart failure: Secondary | ICD-10-CM

## 2021-06-29 MED ORDER — NITROGLYCERIN 0.4 MG SL SUBL: 0.4000 mg | SUBLINGUAL_TABLET | SUBLINGUAL | 3 refills | Status: DC | PRN

## 2021-06-29 MED ORDER — CARVEDILOL 12.5 MG PO TABS: 12.5000 mg | ORAL_TABLET | Freq: Two times a day (BID) | ORAL | 3 refills | Status: DC

## 2021-06-29 MED ORDER — SPIRONOLACTONE 25 MG PO TABS: 12.5000 mg | ORAL_TABLET | Freq: Every day | ORAL | 3 refills | Status: DC

## 2021-06-29 NOTE — Patient Instructions (Signed)
Medication Instructions:  1) DECREASE Carvedilol to 12.5mg  twice daily  *If you need a refill on your cardiac medications before your next appointment, please call your pharmacy*   Lab Work: None If you have labs (blood work) drawn today and your tests are completely normal, you will receive your results only by: [place] (if you have MyChart) OR A paper copy in the mail If you have any lab test that is abnormal or we need to change your treatment, we will call you to review the results.   Testing/Procedures: Your physician has recommended that you wear an event monitor. Event monitors are medical devices that record the heart's electrical activity. Doctors most often [place] these monitors to diagnose arrhythmias. Arrhythmias are problems with the speed or rhythm of the heartbeat. The monitor is a small, portable device. You can wear one while you do your normal daily activities. This is usually used to diagnose what is causing palpitations/syncope (passing out).   Follow-Up: At [facility], you and your health needs are our priority.  As part of our continuing mission to provide you with exceptional heart care, we have created designated Provider Care Teams.  These Care Teams include your primary Cardiologist (physician) and Advanced Practice Providers (APPs -  Physician Assistants and Nurse Practitioners) who all work together to provide you with the care you need, when you need it.  We recommend signing up for the patient portal called "MyChart".  Sign up information is provided on this After Visit Summary.  MyChart is used to connect with patients for Virtual Visits (Telemedicine).  Patients are able to view lab/test results, encounter notes, upcoming appointments, etc.  Non-urgent messages can be sent to your provider as well.   To learn more about what you can do with MyChart, go to [email or web address].    Your next appointment:   6 month(s)  The format for your next  appointment:   In Person  Provider:   You may see Sinclair Grooms, MD or one of the following Advanced Practice Providers on your designated Care Team:   Kathyrn Drown, NP   Other Instructions  [employer] Cardiac Event Monitor Instructions Your physician has requested you wear your cardiac event monitor for 30 days. [employer] may call or text to confirm a shipping address. The monitor will be sent to a land address via UPS. [employer] will not ship a monitor to a PO BOX. It typically takes 3-5 days to receive your monitor after it has been enrolled. [employer] will assist with USPS tracking if your package is delayed. The telephone number for [employer] is [phone]. Once you have received your monitor, please review the enclosed instructions. Instruction tutorials can also be viewed under help and settings on the enclosed cell phone. Your monitor has already been registered assigning a specific monitor serial # to you.  Applying the monitor Remove cell phone from case and turn it on. The cell phone works as Dealer and needs to be within [employer] of you at all times. The cell phone will need to be charged on a daily basis. We recommend you plug the cell phone into the enclosed charger at your bedside table every night.  Monitor batteries: You will receive two monitor batteries labelled #1 and #2. These are your recorders. Plug battery #2 onto the second connection on the enclosed charger. Keep one battery on the charger at all times. This will keep the monitor battery deactivated. It will also keep it fully charged  for when you need to switch your monitor batteries. A small light will be blinking on the battery emblem when it is charging. The light on the battery emblem will remain on when the battery is fully charged.  Open package of a Monitor strip. Insert battery #1 into black hood on strip and gently squeeze monitor battery onto connection as indicated in  instruction booklet. Set aside while preparing skin.  Choose location for your strip, vertical or horizontal, as indicated in the instruction booklet. Shave to remove all hair from location. There cannot be any lotions, oils, powders, or colognes on skin where monitor is to be applied. Wipe skin clean with enclosed Saline wipe. Dry skin completely.  Peel paper labeled #1 off the back of the Monitor strip exposing the adhesive. Place the monitor on the chest in the vertical or horizontal position shown in the instruction booklet. One arrow on the monitor strip must be pointing upward. Carefully remove paper labeled #2, attaching remainder of strip to your skin. Try not to create any folds or wrinkles in the strip as you apply it.  Firmly press and release the circle in the center of the monitor battery. You will hear a small beep. This is turning the monitor battery on. The heart emblem on the monitor battery will light up every 5 seconds if the monitor battery in turned on and connected to the patient securely. Do not push and hold the circle down as this turns the monitor battery off. The cell phone will locate the monitor battery. A screen will appear on the cell phone checking the connection of your monitor strip. This may read poor connection initially but change to good connection within the next minute. Once your monitor accepts the connection you will hear a series of 3 beeps followed by a climbing crescendo of beeps. A screen will appear on the cell phone showing the two monitor strip placement options. Touch the picture that demonstrates where you applied the monitor strip.  Your monitor strip and battery are waterproof. You are able to shower, bathe, or swim with the monitor on. They just ask you do not submerge deeper than 3 feet underwater. We recommend removing the monitor if you are swimming in a lake, river, or ocean.  Your monitor battery will need to be switched to a fully  charged monitor battery approximately once a week. The cell phone will alert you of an action which needs to be made.  On the cell phone, tap for details to reveal connection status, monitor battery status, and cell phone battery status. The green dots indicates your monitor is in good status. A red dot indicates there is something that needs your attention.  To record a symptom, click the circle on the monitor battery. In 30-60 seconds a list of symptoms will appear on the cell phone. Select your symptom and tap save. Your monitor will record a sustained or significant arrhythmia regardless of you clicking the button. Some patients do not feel the heart rhythm irregularities. [employer] will notify us of any serious or critical events.  Refer to instruction booklet for instructions on switching batteries, changing strips, the Do not disturb or Pause features, or any additional questions.  Call [employer] at [phone], to confirm your monitor is transmitting and record your baseline. They will answer any questions you may have regarding the monitor instructions at that time.  Returning the monitor to [place] all equipment back into blue box. Peel off strip of paper  to expose adhesive and close box securely. There is a prepaid UPS shipping label on this box. Drop in a UPS drop box, or at a UPS facility like [employer]. You may also contact [employer] to arrange UPS to pick up monitor package at your home.

## 2021-06-29 NOTE — Progress Notes (Signed)
Patient ID: [name], female   DOB: 09/25/1934, 87 y.o.   MRN: [id number] Enrolled patient for a 30 day [employer] Event Monitor to be mailed to patients home

## 2021-07-08 ENCOUNTER — Ambulatory Visit (INDEPENDENT_AMBULATORY_CARE_PROVIDER_SITE_OTHER): Payer: Medicare Other

## 2021-07-08 DIAGNOSIS — I480 Paroxysmal atrial fibrillation: Secondary | ICD-10-CM

## 2021-07-08 DIAGNOSIS — R55 Syncope and collapse: Secondary | ICD-10-CM | POA: Diagnosis not present

## 2021-07-10 DIAGNOSIS — R300 Dysuria: Secondary | ICD-10-CM | POA: Diagnosis not present

## 2021-07-10 DIAGNOSIS — F320 Major depressive disorder, single episode, mild: Secondary | ICD-10-CM | POA: Diagnosis not present

## 2021-07-10 DIAGNOSIS — I10 Essential (primary) hypertension: Secondary | ICD-10-CM | POA: Diagnosis not present

## 2021-07-10 DIAGNOSIS — I480 Paroxysmal atrial fibrillation: Secondary | ICD-10-CM | POA: Diagnosis not present

## 2021-07-10 DIAGNOSIS — D6869 Other thrombophilia: Secondary | ICD-10-CM | POA: Diagnosis not present

## 2021-07-17 DIAGNOSIS — E11319 Type 2 diabetes mellitus with unspecified diabetic retinopathy without macular edema: Secondary | ICD-10-CM | POA: Diagnosis not present

## 2021-07-17 DIAGNOSIS — I2510 Atherosclerotic heart disease of native coronary artery without angina pectoris: Secondary | ICD-10-CM | POA: Diagnosis not present

## 2021-07-17 DIAGNOSIS — Z794 Long term (current) use of insulin: Secondary | ICD-10-CM | POA: Diagnosis not present

## 2021-07-17 DIAGNOSIS — Z8639 Personal history of other endocrine, nutritional and metabolic disease: Secondary | ICD-10-CM | POA: Diagnosis not present

## 2021-08-21 DIAGNOSIS — M150 Primary generalized (osteo)arthritis: Secondary | ICD-10-CM | POA: Diagnosis not present

## 2021-08-21 DIAGNOSIS — L409 Psoriasis, unspecified: Secondary | ICD-10-CM | POA: Diagnosis not present

## 2021-08-21 DIAGNOSIS — R5383 Other fatigue: Secondary | ICD-10-CM | POA: Diagnosis not present

## 2021-08-21 DIAGNOSIS — Z6836 Body mass index (BMI) 36.0-36.9, adult: Secondary | ICD-10-CM | POA: Diagnosis not present

## 2021-08-21 DIAGNOSIS — E669 Obesity, unspecified: Secondary | ICD-10-CM | POA: Diagnosis not present

## 2021-08-21 DIAGNOSIS — Z79899 Other long term (current) drug therapy: Secondary | ICD-10-CM | POA: Diagnosis not present

## 2021-08-21 DIAGNOSIS — L4059 Other psoriatic arthropathy: Secondary | ICD-10-CM | POA: Diagnosis not present

## 2021-08-21 DIAGNOSIS — M2550 Pain in unspecified joint: Secondary | ICD-10-CM | POA: Diagnosis not present

## 2021-08-23 ENCOUNTER — Telehealth: Payer: Self-pay | Admitting: Interventional Cardiology

## 2021-08-23 NOTE — Telephone Encounter (Addendum)
Informed pt of results. Pt verbalized understanding. 

## 2021-08-23 NOTE — Telephone Encounter (Signed)
    Pt is returning call to get heart monitor result 

## 2021-08-28 DIAGNOSIS — F320 Major depressive disorder, single episode, mild: Secondary | ICD-10-CM | POA: Diagnosis not present

## 2021-08-28 DIAGNOSIS — I519 Heart disease, unspecified: Secondary | ICD-10-CM | POA: Diagnosis not present

## 2021-09-14 ENCOUNTER — Other Ambulatory Visit: Payer: Self-pay | Admitting: *Deleted

## 2021-09-14 MED ORDER — RAMIPRIL 5 MG PO CAPS: ORAL_CAPSULE | ORAL | 5 refills | Status: DC

## 2021-10-04 DIAGNOSIS — Z23 Encounter for immunization: Secondary | ICD-10-CM | POA: Diagnosis not present

## 2021-10-12 DIAGNOSIS — H524 Presbyopia: Secondary | ICD-10-CM | POA: Diagnosis not present

## 2021-10-12 DIAGNOSIS — E113293 Type 2 diabetes mellitus with mild nonproliferative diabetic retinopathy without macular edema, bilateral: Secondary | ICD-10-CM | POA: Diagnosis not present

## 2021-10-12 DIAGNOSIS — Z961 Presence of intraocular lens: Secondary | ICD-10-CM | POA: Diagnosis not present

## 2021-10-16 DIAGNOSIS — Z794 Long term (current) use of insulin: Secondary | ICD-10-CM | POA: Diagnosis not present

## 2021-10-16 DIAGNOSIS — E11319 Type 2 diabetes mellitus with unspecified diabetic retinopathy without macular edema: Secondary | ICD-10-CM | POA: Diagnosis not present

## 2021-10-16 DIAGNOSIS — I2510 Atherosclerotic heart disease of native coronary artery without angina pectoris: Secondary | ICD-10-CM | POA: Diagnosis not present

## 2021-10-16 DIAGNOSIS — Z8639 Personal history of other endocrine, nutritional and metabolic disease: Secondary | ICD-10-CM | POA: Diagnosis not present

## 2021-11-09 DIAGNOSIS — Z96649 Presence of unspecified artificial hip joint: Secondary | ICD-10-CM | POA: Diagnosis not present

## 2021-11-09 DIAGNOSIS — M7062 Trochanteric bursitis, left hip: Secondary | ICD-10-CM | POA: Diagnosis not present

## 2021-12-05 DIAGNOSIS — I10 Essential (primary) hypertension: Secondary | ICD-10-CM | POA: Diagnosis not present

## 2021-12-05 DIAGNOSIS — E7800 Pure hypercholesterolemia, unspecified: Secondary | ICD-10-CM | POA: Diagnosis not present

## 2021-12-05 DIAGNOSIS — Z79899 Other long term (current) drug therapy: Secondary | ICD-10-CM | POA: Diagnosis not present

## 2021-12-05 DIAGNOSIS — D6869 Other thrombophilia: Secondary | ICD-10-CM | POA: Diagnosis not present

## 2021-12-05 DIAGNOSIS — I519 Heart disease, unspecified: Secondary | ICD-10-CM | POA: Diagnosis not present

## 2021-12-05 DIAGNOSIS — Z0000 Encounter for general adult medical examination without abnormal findings: Secondary | ICD-10-CM | POA: Diagnosis not present

## 2021-12-05 DIAGNOSIS — I480 Paroxysmal atrial fibrillation: Secondary | ICD-10-CM | POA: Diagnosis not present

## 2021-12-05 DIAGNOSIS — Z6836 Body mass index (BMI) 36.0-36.9, adult: Secondary | ICD-10-CM | POA: Diagnosis not present

## 2021-12-05 DIAGNOSIS — Z794 Long term (current) use of insulin: Secondary | ICD-10-CM | POA: Diagnosis not present

## 2021-12-05 DIAGNOSIS — E11319 Type 2 diabetes mellitus with unspecified diabetic retinopathy without macular edema: Secondary | ICD-10-CM | POA: Diagnosis not present

## 2021-12-05 DIAGNOSIS — L4050 Arthropathic psoriasis, unspecified: Secondary | ICD-10-CM | POA: Diagnosis not present

## 2022-01-06 NOTE — Progress Notes (Signed)
Cardiology Office Note:    Date:  01/06/2022   ID:  [name], DOB 31-Aug-1934, MRN [id number]  PCP:  [name], MD  Cardiologist:  Lesleigh Noe, MD   Referring MD: [name], MD   No chief complaint on file.   History of Present Illness:    [name] is a 88 y.o. female with a hx of coronary artery disease status post CABG in 2006, paroxysmal atrial fibrillation on chronic anticoagulation with dabigatran, rheumatoid arthritis, diabetes, hypertension, prior stroke, complicated migraines, now c/o LEE and dyspnea for 3 weeks. Started on furosemide 20 mg daily but we discontinued because swelling led to AKI.  Discussed episode of Syncope at last OV.  She does have diastolic dysfunction but is tried [place] without been able to tolerate.  She gets cold each afternoon and wants to know if her blood thinner is causing that.  Her insurance company/[facility], will no longer pay for Pradaxa as their preferred anticoagulant.  They will provide [place] the document with the request to switch so that we can better understand the indication.  It would give [place] guidance about which alternative therapy to choose.  She does not want to be on it Coumadin anticoagulation.  Past Medical History:  Diagnosis Date   Anginal pain (HCC)    "saw doctor-think it was esophagus spasm"   Arthritis    Atrial flutter (HCC) 11/02/2013   Cataract    left eye   Coronary artery disease    Diabetes mellitus without complication (HCC)    type 2   Essential hypertension, benign 11/02/2013   H/O jaundice    as child   Headache 2015   complicated migraine x2    Hearing trouble    Heart disease    Hepatitis    hx of hepatitis A as child   High cholesterol    under control   Long term (current) use of anticoagulants 11/02/2013   Pradaxa    Measles    as child   Mouth dryness    Mumps    as teenager   Numbness and tingling    lips   Osteoporosis     "unsure-maybe in knees"   Psoriasis    Psoriatic arthritis (HCC)    Stroke (HCC) 2012   Vertigo    being monitored, occasional    Past Surgical History:  Procedure Laterality Date   APPENDECTOMY  as child   CARDIAC CATHETERIZATION  ~2006   clear   CORONARY ARTERY BYPASS GRAFT  02/16/2005   LUMBAR DISC SURGERY  09/2004   L1-L5   TONSILLECTOMY  as child   with adenoids   TOTAL HIP ARTHROPLASTY Left 04/19/2019   Procedure: TOTAL HIP ARTHROPLASTY ANTERIOR APPROACH;  Surgeon: Ollen Gross, MD;  Location: [facility];  Service: Orthopedics;  Laterality: Left;   TOTAL KNEE ARTHROPLASTY Left 09/11/2015   Procedure: LEFT TOTAL KNEE ARTHROPLASTY;  Surgeon: Ollen Gross, MD;  Location: [facility];  Service: Orthopedics;  Laterality: Left;    Current Medications: No outpatient medications have been marked as taking for the 01/08/22 encounter (Appointment) with Lyn Records, MD.     Allergies:   Codeine, Penicillins, Atenolol, Atorvastatin, Metoprolol, and Zetia [ezetimibe]   Social History   Socioeconomic History   Marital status: Married    Spouse name: Jillyn Hidden   Number of children: 3   Years of education: 16   Highest education level: Bachelor's degree (e.g., BA, AB, BS)  Occupational History   Occupation: Retired  Tobacco Use   Smoking status: Never   Smokeless tobacco: Never  Vaping Use   Vaping Use: Never used  Substance and Sexual Activity   Alcohol use: No    Alcohol/week: 0.0 standard drinks   Drug use: No   Sexual activity: Not on file  Other Topics Concern   Not on file  Social History Narrative   Patient is married with 3 children.   Patient is left handed.   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   Patient drinks 3 cups daily.   Social Determinants of Health   Financial Resource Strain: Not on file  Food Insecurity: Not on file  Transportation Needs: Not on file  Physical Activity: Not on file  Stress: Not on file  Social Connections: Not on file      Family History: The patient's family history includes Heart failure in her mother; Neuromuscular disorder in her father.  ROS:   Please see the history of present illness.    No episodes of syncope.  Denies palpitations and racing heart.  All other systems reviewed and are negative.  EKGs/Labs/Other Studies Reviewed:    The following studies were reviewed today:  ECHOCARDIOGRAM 2021: IMPRESSIONS     1. Left ventricular ejection fraction, by estimation, is 60 to 65%. The  left ventricle has normal function. The left ventricle has no regional  wall motion abnormalities. There is severe left ventricular hypertrophy of  the basal-septal segment. Left  ventricular diastolic parameters are consistent with Grade I diastolic  dysfunction (impaired relaxation). Elevated left ventricular end-diastolic  pressure.   2. Right ventricular systolic function is normal. The right ventricular  size is normal.   3. The mitral valve is degenerative. Trivial mitral valve regurgitation.  No evidence of mitral stenosis.   4. The aortic valve is tricuspid. Aortic valve regurgitation is not  visualized. Mild to moderate aortic valve sclerosis/calcification is  present, without any evidence of aortic stenosis.   5. The inferior vena cava is normal in size with greater than 50%  respiratory variability, suggesting right atrial pressure of 3 mmHg.   EKG:  EKG sinus rhythm, sinus bradycardia with first-degree AV block, left bundle branch block, and left axis deviation on tracing performed in July 2022.  Recent Labs: No results found for requested labs within last 8760 hours.  Recent Lipid Panel    Component Value Date/Time   CHOL 137 08/05/2014 0453   TRIG 105 08/05/2014 0453   HDL 65 08/05/2014 0453   CHOLHDL 2.1 08/05/2014 0453   VLDL 21 08/05/2014 0453   LDLCALC 51 08/05/2014 0453    Physical Exam:    VS:  There were no vitals taken for this visit.    Wt Readings from Last 3 Encounters:   06/29/21 203 lb 6.4 oz (92.3 kg)  10/09/20 205 lb (93 kg)  06/26/20 197 lb 3.2 oz (89.4 kg)     GEN: Morbid obesity. No acute distress HEENT: Normal NECK: No JVD. LYMPHATICS: No lymphadenopathy CARDIAC: Scratchy right upper sternal 1/6 systolic murmur. RRR no gallop, or edema. VASCULAR:  Normal Pulses. No bruits. RESPIRATORY:  Clear to auscultation without rales, wheezing or rhonchi  ABDOMEN: Soft, non-tender, non-distended, No pulsatile mass, MUSCULOSKELETAL: No deformity  SKIN: Warm and dry NEUROLOGIC:  Alert and oriented x 3 PSYCHIATRIC:  Normal affect   ASSESSMENT:    1. Chronic diastolic heart failure (HCC)   2. Coronary artery disease involving native coronary  artery of native heart without angina pectoris   3. Long term current use of anticoagulant therapy   4. Essential hypertension   5. Left bundle branch block   6. Paroxysmal atrial fibrillation (HCC)    PLAN:    In order of problems listed above:  Unable to tolerate either [place] or [place].  Currently not complaining of shortness of breath.  Continue carvedilol, Altace, and low-dose Aldactone. Secondary prevention reviewed Will switch from Pradaxa to the preferred [employer] DOAC. Blood pressure repeated is 136/62 mmHg.  No change in therapy. A repeat EKG was not performed. No clinical episodes of atrial fibrillation.   Medication Adjustments/Labs and Tests Ordered: Current medicines are reviewed at length with the patient today.  Concerns regarding medicines are outlined above.  No orders of the defined types were placed in this encounter.  No orders of the defined types were placed in this encounter.   There are no Patient Instructions on file for this visit.   Signed, Lesleigh Noe, MD  01/06/2022 2:43 PM    [facility]

## 2022-01-08 ENCOUNTER — Encounter: Payer: Self-pay | Admitting: Interventional Cardiology

## 2022-01-08 ENCOUNTER — Ambulatory Visit (INDEPENDENT_AMBULATORY_CARE_PROVIDER_SITE_OTHER): Payer: Medicare Other | Admitting: Interventional Cardiology

## 2022-01-08 ENCOUNTER — Other Ambulatory Visit: Payer: Self-pay

## 2022-01-08 VITALS — BP 110/56 | HR 64 | Ht 62.0 in | Wt 201.0 lb

## 2022-01-08 DIAGNOSIS — I5032 Chronic diastolic (congestive) heart failure: Secondary | ICD-10-CM | POA: Diagnosis not present

## 2022-01-08 DIAGNOSIS — I447 Left bundle-branch block, unspecified: Secondary | ICD-10-CM

## 2022-01-08 DIAGNOSIS — I2510 Atherosclerotic heart disease of native coronary artery without angina pectoris: Secondary | ICD-10-CM

## 2022-01-08 DIAGNOSIS — Z7901 Long term (current) use of anticoagulants: Secondary | ICD-10-CM | POA: Diagnosis not present

## 2022-01-08 DIAGNOSIS — I10 Essential (primary) hypertension: Secondary | ICD-10-CM

## 2022-01-08 DIAGNOSIS — I480 Paroxysmal atrial fibrillation: Secondary | ICD-10-CM | POA: Diagnosis not present

## 2022-01-08 NOTE — Patient Instructions (Signed)
Medication Instructions:  Your physician recommends that you continue on your current medications as directed. Please refer to the Current Medication list given to you today.  *If you need a refill on your cardiac medications before your next appointment, please call your pharmacy*   Lab Work: None If you have labs (blood work) drawn today and your tests are completely normal, you will receive your results only by: [place] (if you have MyChart) OR A paper copy in the mail If you have any lab test that is abnormal or we need to change your treatment, we will call you to review the results.   Testing/Procedures: None   Follow-Up: At [facility], you and your health needs are our priority.  As part of our continuing mission to provide you with exceptional heart care, we have created designated Provider Care Teams.  These Care Teams include your primary Cardiologist (physician) and Advanced Practice Providers (APPs -  Physician Assistants and Nurse Practitioners) who all work together to provide you with the care you need, when you need it.  We recommend signing up for the patient portal called "MyChart".  Sign up information is provided on this After Visit Summary.  MyChart is used to connect with patients for Virtual Visits (Telemedicine).  Patients are able to view lab/test results, encounter notes, upcoming appointments, etc.  Non-urgent messages can be sent to your provider as well.   To learn more about what you can do with MyChart, go to [email or web address].    Your next appointment:   10-12 month(s)  The format for your next appointment:   In Person  Provider:   Sinclair Grooms, MD     Other Instructions

## 2022-01-11 DIAGNOSIS — Z23 Encounter for immunization: Secondary | ICD-10-CM | POA: Diagnosis not present

## 2022-01-15 DIAGNOSIS — E1165 Type 2 diabetes mellitus with hyperglycemia: Secondary | ICD-10-CM | POA: Diagnosis not present

## 2022-01-15 DIAGNOSIS — I2510 Atherosclerotic heart disease of native coronary artery without angina pectoris: Secondary | ICD-10-CM | POA: Diagnosis not present

## 2022-01-15 DIAGNOSIS — Z8639 Personal history of other endocrine, nutritional and metabolic disease: Secondary | ICD-10-CM | POA: Diagnosis not present

## 2022-01-15 DIAGNOSIS — Z794 Long term (current) use of insulin: Secondary | ICD-10-CM | POA: Diagnosis not present

## 2022-01-15 DIAGNOSIS — E11319 Type 2 diabetes mellitus with unspecified diabetic retinopathy without macular edema: Secondary | ICD-10-CM | POA: Diagnosis not present

## 2022-01-25 DIAGNOSIS — I480 Paroxysmal atrial fibrillation: Secondary | ICD-10-CM | POA: Diagnosis not present

## 2022-01-25 DIAGNOSIS — E7800 Pure hypercholesterolemia, unspecified: Secondary | ICD-10-CM | POA: Diagnosis not present

## 2022-01-25 DIAGNOSIS — E11319 Type 2 diabetes mellitus with unspecified diabetic retinopathy without macular edema: Secondary | ICD-10-CM | POA: Diagnosis not present

## 2022-01-25 DIAGNOSIS — I10 Essential (primary) hypertension: Secondary | ICD-10-CM | POA: Diagnosis not present

## 2022-01-25 DIAGNOSIS — E1165 Type 2 diabetes mellitus with hyperglycemia: Secondary | ICD-10-CM | POA: Diagnosis not present

## 2022-02-12 ENCOUNTER — Telehealth: Payer: Self-pay | Admitting: Interventional Cardiology

## 2022-02-12 NOTE — Telephone Encounter (Signed)
STAT if HR is under 50 or over 120 (normal HR is 60-100 beats per minute)  What is your heart rate? 60s  Do you have a log of your heart rate readings (document readings)?    Do you have any other symptoms? Patients has feel very weak and extra tired

## 2022-02-12 NOTE — Telephone Encounter (Signed)
Spoke with daughter in law earlier and she was called to come and check on mother in law Pt complaining of weakness,dizzy, clammy, nausea and faint This am HR 60's O2 96% sugar within normal range  B/P 128/59 Pt is urinating okay Family is wanting pt to be seen today  Discussed with Dr Radford Pax pt needs to go to ED for eval and tx Daughter in law called back and informed her of recommendations and agrees with plan .Will forward to Dr Tamala Julian for review .Adonis Housekeeper

## 2022-02-12 NOTE — Telephone Encounter (Deleted)
Spoke with daughter in law earlier and she was called to come and check on mother in law Pt complaining of weakness,dizzy, clammy, nausea and faint This am HR 60's O2 96% sugar within normal range  B/P 128/59 Pt is urinating okay Family is wanting pt to be seen today  Discussed with Dr Radford Pax pt needs to go to ED for eval and tx Daughter in law called back and informed her of recommendations and agrees with plan .

## 2022-02-14 NOTE — Telephone Encounter (Signed)
Spoke with Glenard Haring and pt did not go get checked out Tuesday as advised.  She told Glenard Haring she still "feels weird" today.  Nausea has resolved.  Still having dizziness, fatigue and weakness.  Vitals still normal.  On Tuesday pt almost fainted while making breakfast.  Denies CP or SOB.  At last OV 1st degree AV heart block was mentioned and monitor was worn. Glenard Haring and pt still concerned about symptoms and daughter, Opal Sidles (who is a Marine scientist), requested Glenard Haring contact the office about an appt.  Scheduled pt to see DOD, Dr. Gasper Sells, tomorrow for eval.  Glenard Haring appreciative for assistance.

## 2022-02-15 ENCOUNTER — Other Ambulatory Visit: Payer: Self-pay

## 2022-02-15 ENCOUNTER — Encounter: Payer: Self-pay | Admitting: Internal Medicine

## 2022-02-15 ENCOUNTER — Ambulatory Visit (INDEPENDENT_AMBULATORY_CARE_PROVIDER_SITE_OTHER): Payer: Medicare Other | Admitting: Internal Medicine

## 2022-02-15 ENCOUNTER — Ambulatory Visit (INDEPENDENT_AMBULATORY_CARE_PROVIDER_SITE_OTHER): Payer: Medicare Other

## 2022-02-15 VITALS — BP 111/58 | HR 63 | Ht 62.0 in | Wt 202.0 lb

## 2022-02-15 DIAGNOSIS — I440 Atrioventricular block, first degree: Secondary | ICD-10-CM

## 2022-02-15 DIAGNOSIS — R55 Syncope and collapse: Secondary | ICD-10-CM | POA: Insufficient documentation

## 2022-02-15 DIAGNOSIS — R42 Dizziness and giddiness: Secondary | ICD-10-CM | POA: Diagnosis not present

## 2022-02-15 MED ORDER — CARVEDILOL 6.25 MG PO TABS: 6.2500 mg | ORAL_TABLET | Freq: Two times a day (BID) | ORAL | 3 refills | Status: DC

## 2022-02-15 NOTE — Progress Notes (Unsigned)
Enrolled pt for 14 day Zio XT to be mailed to home address.

## 2022-02-15 NOTE — Progress Notes (Signed)
Cardiology Office Note:    Date:  02/15/2022   ID:  [name], DOB 1934-03-09, MRN [id number]  PCP:  [name], MD  Cardiologist:  Lesleigh Noe, MD   Referring MD: [name], MD   CC:  DOD Syncope   History of Present Illness:    [name] is a 88 y.o. female with a hx of coronary artery disease status post CABG in 2006, paroxysmal atrial fibrillation on dabigatran, LBBB, rheumatoid arthritis, HTN and DM, prior stroke, HFpEF and inability to tolerate recently seen by Dr. Katrinka Blazing for nausea near syncope.  Patient notes that she is doing OK.   Last week has been feeling tired.   She has a lot to do- her husband has 24 hour care and she is responsible for getting this taken care off. Doesn't have a lot of time for herself.  No chest pain or pressure.  Has rare chest stinging last week. No prior MI but anginal equivalent was heart racing; found to have multi-vessel disease. No SOB/DOE and no PND/Orthopnea.   When she is in atrial fibrillation she feels no symptoms. DIL notes that she has still had symptoms of feeling weird with dizziness and fatigue.     Past Medical History:  Diagnosis Date   Anginal pain (HCC)    "saw doctor-think it was esophagus spasm"   Arthritis    Atrial flutter (HCC) 11/02/2013   Cataract    left eye   Coronary artery disease    Diabetes mellitus without complication (HCC)    type 2   Essential hypertension, benign 11/02/2013   H/O jaundice    as child   Headache 2015   complicated migraine x2    Hearing trouble    Heart disease    Hepatitis    hx of hepatitis A as child   High cholesterol    under control   Long term (current) use of anticoagulants 11/02/2013   Pradaxa    Measles    as child   Mouth dryness    Mumps    as teenager   Numbness and tingling    lips   Osteoporosis    "unsure-maybe in knees"   Psoriasis    Psoriatic arthritis (HCC)    Stroke (HCC) 2012   Vertigo    being monitored, occasional     Past Surgical History:  Procedure Laterality Date   APPENDECTOMY  as child   CARDIAC CATHETERIZATION  ~2006   clear   CORONARY ARTERY BYPASS GRAFT  02/16/2005   LUMBAR DISC SURGERY  09/2004   L1-L5   TONSILLECTOMY  as child   with adenoids   TOTAL HIP ARTHROPLASTY Left 04/19/2019   Procedure: TOTAL HIP ARTHROPLASTY ANTERIOR APPROACH;  Surgeon: Ollen Gross, MD;  Location: [facility];  Service: Orthopedics;  Laterality: Left;   TOTAL KNEE ARTHROPLASTY Left 09/11/2015   Procedure: LEFT TOTAL KNEE ARTHROPLASTY;  Surgeon: Ollen Gross, MD;  Location: [facility];  Service: Orthopedics;  Laterality: Left;    Current Medications: Current Meds  Medication Sig   Accu-Chek FastClix Lancets MISC .   acetaminophen (TYLENOL) 650 MG CR tablet Take 650 mg by mouth as needed.   B-D UF III MINI PEN NEEDLES 31G X 5 MM MISC SMARTSIG:Pre-Filled Pen Syringe SUB-Q As Directed   calcium elemental as carbonate (BARIATRIC TUMS ULTRA) 400 MG chewable tablet Chew 1,000 mg by mouth every evening.   carvedilol (COREG) 6.25 MG tablet Take 1 tablet (6.25 mg total) by  mouth 2 (two) times daily.   cholecalciferol (VITAMIN D) 1000 units tablet Take 2,000 Units by mouth daily.   clobetasol cream (TEMOVATE) 0.05 % Apply 1 application topically 2 (two) times daily as needed (skin irritation).   clonazePAM (KLONOPIN) 0.5 MG tablet Take 0.5 mg by mouth at bedtime as needed (for sleep).    docusate sodium (COLACE) 100 MG capsule as needed.   ENBREL 50 MG/ML injection Inject 50 mg into the skin once a week.    glucose blood (ACCU-CHEK GUIDE) test strip USE AS DIRECTED THREE TIMES DAILY TO  CHECK  BLOOD  GLUCOSE   HUMALOG MIX 50/50 KWIKPEN (50-50) 100 UNIT/ML Kwikpen Inject into the skin 2 (two) times daily. 56 Units in AM; 28 Units in PM   Insulin Pen Needle (B-D UF III MINI PEN NEEDLES) 31G X 5 MM MISC See admin instructions.   loratadine (CLARITIN) 10 MG tablet daily at 6 (six) AM.   nitroGLYCERIN (NITROSTAT) 0.4 MG SL  tablet Place 1 tablet (0.4 mg total) under the tongue every 5 (five) minutes x 3 doses as needed for chest pain.   polyethylene glycol (MIRALAX / GLYCOLAX) 17 g packet Take 17 g by mouth daily.   PRADAXA 150 MG CAPS Take 150 mg by mouth 2 (two) times daily.    Probiotic Product (ALIGN PO) Take 1 capsule by mouth daily.   ramipril (ALTACE) 5 MG capsule TAKE (1) CAPSULE TWICE DAILY.   rosuvastatin (CRESTOR) 10 MG tablet Take 10 mg by mouth at bedtime.    spironolactone (ALDACTONE) 25 MG tablet Take 0.5 tablets (12.5 mg total) by mouth daily.   vitamin B-12 (CYANOCOBALAMIN) 250 MCG tablet Take 250 mcg by mouth daily.   [DISCONTINUED] acetaminophen (TYLENOL) 500 MG tablet Take 500 mg by mouth every 6 (six) hours as needed for mild pain.   [DISCONTINUED] carvedilol (COREG) 12.5 MG tablet Take 1 tablet (12.5 mg total) by mouth 2 (two) times daily.     Allergies:   Codeine, Penicillins, Acetaminophen-codeine, Atenolol, Atorvastatin, Chloramphenicol, Dapagliflozin, Empagliflozin, Liraglutide, Metformin hcl, Metoprolol, Zetia [ezetimibe], and Sertraline   Social History   Socioeconomic History   Marital status: Married    Spouse name: Jillyn Hidden   Number of children: 3   Years of education: 16   Highest education level: Bachelor's degree (e.g., BA, AB, BS)  Occupational History   Occupation: Retired  Tobacco Use   Smoking status: Never   Smokeless tobacco: Never  Vaping Use   Vaping Use: Never used  Substance and Sexual Activity   Alcohol use: No    Alcohol/week: 0.0 standard drinks   Drug use: No   Sexual activity: Not on file  Other Topics Concern   Not on file  Social History Narrative   Patient is married with 3 children.   Patient is left handed.   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   Patient drinks 3 cups daily.   Social Determinants of Health   Financial Resource Strain: Not on file  Food Insecurity: Not on file  Transportation Needs: Not on file  Physical  Activity: Not on file  Stress: Not on file  Social Connections: Not on file     Family History: The patient's family history includes Heart failure in her mother; Neuromuscular disorder in her father.  ROS:   Please see the history of present illness.    No episodes of syncope.  Denies palpitations and racing heart.  All other systems reviewed and are negative.  EKGs/Labs/Other Studies Reviewed:    The following studies were reviewed today: 02/15/22: SR 1st HB PR 240 ms and LBBB  ECHOCARDIOGRAM 2021: IMPRESSIONS     1. Left ventricular ejection fraction, by estimation, is 60 to 65%. The  left ventricle has normal function. The left ventricle has no regional  wall motion abnormalities. There is severe left ventricular hypertrophy of  the basal-septal segment. Left  ventricular diastolic parameters are consistent with Grade I diastolic  dysfunction (impaired relaxation). Elevated left ventricular end-diastolic  pressure.   2. Right ventricular systolic function is normal. The right ventricular  size is normal.   3. The mitral valve is degenerative. Trivial mitral valve regurgitation.  No evidence of mitral stenosis.   4. The aortic valve is tricuspid. Aortic valve regurgitation is not  visualized. Mild to moderate aortic valve sclerosis/calcification is  present, without any evidence of aortic stenosis.   5. The inferior vena cava is normal in size with greater than 50%  respiratory variability, suggesting right atrial pressure of 3 mmHg.     Recent Labs: No results found for requested labs within last 8760 hours.  Recent Lipid Panel    Component Value Date/Time   CHOL 137 08/05/2014 0453   TRIG 105 08/05/2014 0453   HDL 65 08/05/2014 0453   CHOLHDL 2.1 08/05/2014 0453   VLDL 21 08/05/2014 0453   LDLCALC 51 08/05/2014 0453    Physical Exam:    VS:  BP (!) 111/58    Pulse 63    Ht 5\' 2"  (1.575 m)    Wt 91.6 kg    SpO2 98%    BMI 36.95 kg/m     Wt Readings from  Last 3 Encounters:  02/15/22 91.6 kg  01/08/22 91.2 kg  06/29/21 92.3 kg     Gen: no distress, Morbid obesity   Neck: No JVD,  Cardiac: No Rubs or Gallops, soft systolic murmur, RRR +2 radial pulses Respiratory: Clear to auscultation bilaterally, normal effort, normal  respiratory rate GI: Soft, nontender, non-distended  MS: +1 bilateral LE edema;  moves all extremities Integument: Skin feels warm Neuro:  At time of evaluation, alert and oriented to person/place/time/situation  Psych: Normal affect, patient feels ok   ASSESSMENT:    1. Syncope, unspecified syncope type   2. Dizziness   3. 1st degree AV block     PLAN:    Coronary artery disease status post CABG in 2006 - with vague sx prior to coronary eval PAF CHADSVASC of 8 on dabigatran 1st HB and LBBB with prior heart monitor without high grade AV block HTN and DM, prior stroke, HFpEF Near syncope and fatigue - will decrease coreg to 6.25 mg PO BID, if worsening fatigue despite low BB may repeat ischemic eval as her prior sx before CABG were fatigue and arm weakness - will repeat monitor for heart block - will repeat echo - three month follow up [facility]  Provider note- patient is profoundly hard of hearing  Time Spent Directly with Patient:   I have spent a total of 40 minutes with the patient reviewing notes, imaging, EKGs, labs and examining the patient as well as establishing an assessment and plan that was discussed personally with the patient.  > 50% of time was spent in direct patient care and family   Medication Adjustments/Labs and Tests Ordered: Current medicines are reviewed at length with the patient today.  Concerns regarding medicines are outlined above.  Orders Placed This Encounter  Procedures  LONG TERM MONITOR (3-14 DAYS)   EKG 12-Lead   ECHOCARDIOGRAM COMPLETE   Meds ordered this encounter  Medications   carvedilol (COREG) 6.25 MG tablet    Sig: Take 1 tablet (6.25 mg total) by mouth 2  (two) times daily.    Dispense:  180 tablet    Refill:  3    Patient Instructions  Medication Instructions:  Your physician has recommended you make the following change in your medication:  DECREASE: carvedilol (Coreg) to 6.25 mg by mouth twice daily *If you need a refill on your cardiac medications before your next appointment, please call your pharmacy*   Lab Work: NONE If you have labs (blood work) drawn today and your tests are completely normal, you will receive your results only by: MyChart Message (if you have MyChart) OR A paper copy in the mail If you have any lab test that is abnormal or we need to change your treatment, we will call you to review the results.   Testing/Procedures: Your physician has requested that you wear a 14 day heart monitor.   Your physician has requested that you have an echocardiogram. Echocardiography is a painless test that uses sound waves to create images of your heart. It provides your doctor with information about the size and shape of your heart and how well your hearts chambers and valves are working. This procedure takes approximately one hour. There are no restrictions for this procedure.    Follow-Up: At [facility], you and your health needs are our priority.  As part of our continuing mission to provide you with exceptional heart care, we have created designated Provider Care Teams.  These Care Teams include your primary Cardiologist (physician) and Advanced Practice Providers (APPs -  Physician Assistants and Nurse Practitioners) who all work together to provide you with the care you need, when you need it.  We recommend signing up for the patient portal called "MyChart".  Sign up information is provided on this After Visit Summary.  MyChart is used to connect with patients for Virtual Visits (Telemedicine).  Patients are able to view lab/test results, encounter notes, upcoming appointments, etc.  Non-urgent messages can be sent to  your provider as well.   To learn more about what you can do with MyChart, go to [email or web address].    Your next appointment:   3 month(s)  The format for your next appointment:   In Person  Provider:   Lesleigh Noe, MD  or Ronie Spies, PA-C, Jacolyn Reedy, PA-C, or Tereso Newcomer, PA-C         Other Instructions Christena Deem- Long Term Monitor Instructions  Your physician has requested you wear a ZIO patch monitor for 14 days.  This is a single patch monitor. [employer] supplies one patch monitor per enrollment. Additional stickers are not available. Please do not apply patch if you will be having a Nuclear Stress Test,  Echocardiogram, Cardiac CT, MRI, or Chest Xray during the period you would be wearing the  monitor. The patch cannot be worn during these tests. You cannot remove and re-apply the  ZIO XT patch monitor.  Your ZIO patch monitor will be mailed 3 day USPS to your address on file. It may take 3-5 days  to receive your monitor after you have been enrolled.  Once you have received your monitor, please review the enclosed instructions. Your monitor  has already been registered assigning a specific monitor serial # to you.  Billing and Patient Assistance  Program Information  We have supplied [employer] with any of your insurance information on file for billing purposes. [employer] offers a sliding scale Patient Assistance Program for patients that do not have  insurance, or whose insurance does not completely cover the cost of the ZIO monitor.  You must apply for the Patient Assistance Program to qualify for this discounted rate.  To apply, please call [employer] at [phone], select option 4, select option 2, ask to apply for  Patient Assistance Program. Meredeth Ide will ask your household income, and how many people  are in your household. They will quote your out-of-pocket cost based on that information.  [employer] will also be able to set up a 61-month, interest-free payment plan  if needed.  Applying the monitor   Shave hair from upper left chest.  Hold abrader disc by orange tab. Rub abrader in 40 strokes over the upper left chest as  indicated in your monitor instructions.  Clean area with 4 enclosed alcohol pads. Let dry.  Apply patch as indicated in monitor instructions. Patch will be placed under collarbone on left  side of chest with arrow pointing upward.  Rub patch adhesive wings for 2 minutes. Remove white label marked "1". Remove the white  label marked "2". Rub patch adhesive wings for 2 additional minutes.  While looking in a mirror, press and release button in center of patch. A small green light will  flash 3-4 times. This will be your only indicator that the monitor has been turned on.  Do not shower for the first 24 hours. You may shower after the first 24 hours.  Press the button if you feel a symptom. You will hear a small click. Record Date, Time and  Symptom in the Patient Logbook.  When you are ready to remove the patch, follow instructions on the last 2 pages of Patient  Logbook. Stick patch monitor onto the last page of Patient Logbook.  Place Patient Logbook in the blue and white box. Use locking tab on box and tape box closed  securely. The blue and white box has prepaid postage on it. Please place it in the mailbox as  soon as possible. Your physician should have your test results approximately 7 days after the  monitor has been mailed back to [facility].  Call [facility] Customer Care at [phone] if you have questions regarding  your ZIO XT patch monitor. Call them immediately if you see an orange light blinking on your  monitor.  If your monitor falls off in less than 4 days, contact our Monitor department at [phone].  If your monitor becomes loose or falls off after 4 days call [employer] at [phone] for  suggestions on securing your monitor     Signed, Christell Constant, MD  02/15/2022 12:40 PM    [facility]

## 2022-02-15 NOTE — Patient Instructions (Signed)
Medication Instructions:  Your physician has recommended you make the following change in your medication:  DECREASE: carvedilol (Coreg) to 6.25 mg by mouth twice daily *If you need a refill on your cardiac medications before your next appointment, please call your pharmacy*   Lab Work: NONE If you have labs (blood work) drawn today and your tests are completely normal, you will receive your results only by: [name] (if you have MyChart) OR A paper copy in the mail If you have any lab test that is abnormal or we need to change your treatment, we will call you to review the results.   Testing/Procedures: Your physician has requested that you wear a 14 day heart monitor.   Your physician has requested that you have an echocardiogram. Echocardiography is a painless test that uses sound waves to create images of your heart. It provides your doctor with information about the size and shape of your heart and how well your hearts chambers and valves are working. This procedure takes approximately one hour. There are no restrictions for this procedure.    Follow-Up: At [facility], you and your health needs are our priority.  As part of our continuing mission to provide you with exceptional heart care, we have created designated Provider Care Teams.  These Care Teams include your primary Cardiologist (physician) and Advanced Practice Providers (APPs -  Physician Assistants and Nurse Practitioners) who all work together to provide you with the care you need, when you need it.  We recommend signing up for the patient portal called "MyChart".  Sign up information is provided on this After Visit Summary.  MyChart is used to connect with patients for Virtual Visits (Telemedicine).  Patients are able to view lab/test results, encounter notes, upcoming appointments, etc.  Non-urgent messages can be sent to your provider as well.   To learn more about what you can do with MyChart, go to  [email or web address].    Your next appointment:   3 month(s)  The format for your next appointment:   In Person  Provider:   Sinclair Grooms, MD  or Melina Copa, PA-C, Ermalinda Barrios, PA-C, or Richardson Dopp, PA-C         Other Instructions [name]- Long Term Monitor Instructions  Your physician has requested you wear a ZIO patch monitor for 14 days.  This is a single patch monitor. [employer] supplies one patch monitor per enrollment. Additional stickers are not available. Please do not apply patch if you will be having a Nuclear Stress Test,  Echocardiogram, Cardiac CT, MRI, or Chest Xray during the period you would be wearing the  monitor. The patch cannot be worn during these tests. You cannot remove and re-apply the  ZIO XT patch monitor.  Your ZIO patch monitor will be mailed 3 day USPS to your address on file. It may take 3-5 days  to receive your monitor after you have been enrolled.  Once you have received your monitor, please review the enclosed instructions. Your monitor  has already been registered assigning a specific monitor serial # to you.  Billing and Patient Assistance Program Information  We have supplied [employer] with any of your insurance information on file for billing purposes. [employer] offers a sliding scale Patient Assistance Program for patients that do not have  insurance, or whose insurance does not completely cover the cost of the ZIO monitor.  You must apply for the Patient Assistance Program to qualify for this discounted rate.  To  apply, please call [employer] at [phone], select option 4, select option 2, ask to apply for  Patient Assistance Program. [name] will ask your household income, and how many people  are in your household. They will quote your out-of-pocket cost based on that information.  [employer] will also be able to set up a 66-month, interest-free payment plan if needed.  Applying the monitor   Shave hair from upper left chest.  Hold  abrader disc by orange tab. Rub abrader in 40 strokes over the upper left chest as  indicated in your monitor instructions.  Clean area with 4 enclosed alcohol pads. Let dry.  Apply patch as indicated in monitor instructions. Patch will be placed under collarbone on left  side of chest with arrow pointing upward.  Rub patch adhesive wings for 2 minutes. Remove white label marked "1". Remove the white  label marked "2". Rub patch adhesive wings for 2 additional minutes.  While looking in a mirror, press and release button in center of patch. A small green light will  flash 3-4 times. This will be your only indicator that the monitor has been turned on.  Do not shower for the first 24 hours. You may shower after the first 24 hours.  Press the button if you feel a symptom. You will hear a small click. Record Date, Time and  Symptom in the Patient Logbook.  When you are ready to remove the patch, follow instructions on the last 2 pages of Patient  Logbook. Stick patch monitor onto the last page of Patient Logbook.  Place Patient Logbook in the blue and white box. Use locking tab on box and tape box closed  securely. The blue and white box has prepaid postage on it. Please place it in the mailbox as  soon as possible. Your physician should have your test results approximately 7 days after the  monitor has been mailed back to [facility].  Call [place] at [phone] if you have questions regarding  your ZIO XT patch monitor. Call them immediately if you see an orange light blinking on your  monitor.  If your monitor falls off in less than 4 days, contact our Monitor department at [phone].  If your monitor becomes loose or falls off after 4 days call [employer] at [phone] for  suggestions on securing your monitor

## 2022-02-19 DIAGNOSIS — Z79899 Other long term (current) drug therapy: Secondary | ICD-10-CM | POA: Diagnosis not present

## 2022-02-19 DIAGNOSIS — L4059 Other psoriatic arthropathy: Secondary | ICD-10-CM | POA: Diagnosis not present

## 2022-02-19 DIAGNOSIS — M25561 Pain in right knee: Secondary | ICD-10-CM | POA: Diagnosis not present

## 2022-02-19 DIAGNOSIS — Z6837 Body mass index (BMI) 37.0-37.9, adult: Secondary | ICD-10-CM | POA: Diagnosis not present

## 2022-02-19 DIAGNOSIS — L409 Psoriasis, unspecified: Secondary | ICD-10-CM | POA: Diagnosis not present

## 2022-02-19 DIAGNOSIS — M2550 Pain in unspecified joint: Secondary | ICD-10-CM | POA: Diagnosis not present

## 2022-02-19 DIAGNOSIS — M150 Primary generalized (osteo)arthritis: Secondary | ICD-10-CM | POA: Diagnosis not present

## 2022-02-19 DIAGNOSIS — E669 Obesity, unspecified: Secondary | ICD-10-CM | POA: Diagnosis not present

## 2022-02-26 ENCOUNTER — Ambulatory Visit (HOSPITAL_COMMUNITY): Payer: Medicare Other | Attending: Internal Medicine

## 2022-02-26 ENCOUNTER — Other Ambulatory Visit: Payer: Self-pay

## 2022-02-26 DIAGNOSIS — I440 Atrioventricular block, first degree: Secondary | ICD-10-CM

## 2022-02-26 DIAGNOSIS — R55 Syncope and collapse: Secondary | ICD-10-CM | POA: Diagnosis not present

## 2022-02-26 DIAGNOSIS — R42 Dizziness and giddiness: Secondary | ICD-10-CM

## 2022-02-26 LAB — ECHOCARDIOGRAM COMPLETE
Area-P 1/2: 2.6 cm2
S' Lateral: 2.8 cm

## 2022-02-26 MED ORDER — PERFLUTREN LIPID MICROSPHERE
1.0000 mL | INTRAVENOUS | Status: AC | PRN
  Administered 2022-02-26: 1 mL via INTRAVENOUS

## 2022-03-19 DIAGNOSIS — R55 Syncope and collapse: Secondary | ICD-10-CM | POA: Diagnosis not present

## 2022-03-19 DIAGNOSIS — R42 Dizziness and giddiness: Secondary | ICD-10-CM | POA: Diagnosis not present

## 2022-03-19 DIAGNOSIS — I440 Atrioventricular block, first degree: Secondary | ICD-10-CM | POA: Diagnosis not present

## 2022-03-20 DIAGNOSIS — I480 Paroxysmal atrial fibrillation: Secondary | ICD-10-CM | POA: Diagnosis not present

## 2022-03-20 DIAGNOSIS — E7800 Pure hypercholesterolemia, unspecified: Secondary | ICD-10-CM | POA: Diagnosis not present

## 2022-03-20 DIAGNOSIS — E1165 Type 2 diabetes mellitus with hyperglycemia: Secondary | ICD-10-CM | POA: Diagnosis not present

## 2022-03-20 DIAGNOSIS — I10 Essential (primary) hypertension: Secondary | ICD-10-CM | POA: Diagnosis not present

## 2022-04-04 DIAGNOSIS — I10 Essential (primary) hypertension: Secondary | ICD-10-CM | POA: Diagnosis not present

## 2022-04-04 DIAGNOSIS — E7800 Pure hypercholesterolemia, unspecified: Secondary | ICD-10-CM | POA: Diagnosis not present

## 2022-04-04 DIAGNOSIS — K219 Gastro-esophageal reflux disease without esophagitis: Secondary | ICD-10-CM | POA: Diagnosis not present

## 2022-04-04 DIAGNOSIS — I2510 Atherosclerotic heart disease of native coronary artery without angina pectoris: Secondary | ICD-10-CM | POA: Diagnosis not present

## 2022-04-04 DIAGNOSIS — E11319 Type 2 diabetes mellitus with unspecified diabetic retinopathy without macular edema: Secondary | ICD-10-CM | POA: Diagnosis not present

## 2022-04-04 DIAGNOSIS — I480 Paroxysmal atrial fibrillation: Secondary | ICD-10-CM | POA: Diagnosis not present

## 2022-05-12 ENCOUNTER — Other Ambulatory Visit: Payer: Self-pay | Admitting: Interventional Cardiology

## 2022-05-15 DIAGNOSIS — Z8639 Personal history of other endocrine, nutritional and metabolic disease: Secondary | ICD-10-CM | POA: Diagnosis not present

## 2022-05-15 DIAGNOSIS — Z794 Long term (current) use of insulin: Secondary | ICD-10-CM | POA: Diagnosis not present

## 2022-05-15 DIAGNOSIS — E11319 Type 2 diabetes mellitus with unspecified diabetic retinopathy without macular edema: Secondary | ICD-10-CM | POA: Diagnosis not present

## 2022-05-15 DIAGNOSIS — I2510 Atherosclerotic heart disease of native coronary artery without angina pectoris: Secondary | ICD-10-CM | POA: Diagnosis not present

## 2022-05-17 DIAGNOSIS — L821 Other seborrheic keratosis: Secondary | ICD-10-CM | POA: Diagnosis not present

## 2022-05-17 DIAGNOSIS — C44329 Squamous cell carcinoma of skin of other parts of face: Secondary | ICD-10-CM | POA: Diagnosis not present

## 2022-05-17 DIAGNOSIS — Z85828 Personal history of other malignant neoplasm of skin: Secondary | ICD-10-CM | POA: Diagnosis not present

## 2022-05-20 NOTE — Progress Notes (Unsigned)
Cardiology Office Note:    Date:  05/21/2022   ID:  [name], DOB Mar 27, 1934, MRN [id number]  PCP:  [name], MD  Cardiologist:  Lesleigh Noe, MD   Referring MD: [name], MD   Chief Complaint  Patient presents with   Atrial Fibrillation   Loss of Consciousness   Hypertension   Hyperlipidemia    History of Present Illness:    [name] is a 88 y.o. female with a hx of coronary artery disease status post CABG in 2006, paroxysmal atrial fibrillation on chronic anticoagulation with dabigatran, rheumatoid arthritis, diabetes, hypertension, prior stroke, complicated migraines, now c/o LEE and dyspnea for 3 weeks. Started on furosemide 20 mg daily but we discontinued because swelling led to AKI.  Diastolic dysfunction but failed SGLT-2.  She is feeling reasonably well.  Fatigue is not as intense as it was prior to having carvedilol dose reduced.  She denies palpitations.  She still has occasional episodes of lightheadedness/funny feeling in her head but no syncope or near syncope.  Evaluation was done with monitor and echocardiogram when she saw Dr. Raynelle Jan in February.  Overall, the work-up was not alarming.  See results below  Past Medical History:  Diagnosis Date   Anginal pain (HCC)    "saw doctor-think it was esophagus spasm"   Arthritis    Atrial flutter (HCC) 11/02/2013   Cataract    left eye   Coronary artery disease    Diabetes mellitus without complication (HCC)    type 2   Essential hypertension, benign 11/02/2013   H/O jaundice    as child   Headache 2015   complicated migraine x2    Hearing trouble    Heart disease    Hepatitis    hx of hepatitis A as child   High cholesterol    under control   Long term (current) use of anticoagulants 11/02/2013   Pradaxa    Measles    as child   Mouth dryness    Mumps    as teenager   Numbness and tingling    lips   Osteoporosis    "unsure-maybe in knees"   Psoriasis    Psoriatic  arthritis (HCC)    Stroke (HCC) 2012   Vertigo    being monitored, occasional    Past Surgical History:  Procedure Laterality Date   APPENDECTOMY  as child   CARDIAC CATHETERIZATION  ~2006   clear   CORONARY ARTERY BYPASS GRAFT  02/16/2005   LUMBAR DISC SURGERY  09/2004   L1-L5   TONSILLECTOMY  as child   with adenoids   TOTAL HIP ARTHROPLASTY Left 04/19/2019   Procedure: TOTAL HIP ARTHROPLASTY ANTERIOR APPROACH;  Surgeon: Ollen Gross, MD;  Location: [facility];  Service: Orthopedics;  Laterality: Left;   TOTAL KNEE ARTHROPLASTY Left 09/11/2015   Procedure: LEFT TOTAL KNEE ARTHROPLASTY;  Surgeon: Ollen Gross, MD;  Location: [facility];  Service: Orthopedics;  Laterality: Left;    Current Medications: Current Meds  Medication Sig   Accu-Chek FastClix Lancets MISC .   acetaminophen (TYLENOL) 650 MG CR tablet Take 650 mg by mouth as needed.   B-D UF III MINI PEN NEEDLES 31G X 5 MM MISC SMARTSIG:Pre-Filled Pen Syringe SUB-Q As Directed   calcium elemental as carbonate (BARIATRIC TUMS ULTRA) 400 MG chewable tablet Chew 1,000 mg by mouth every evening.   carvedilol (COREG) 6.25 MG tablet Take 1 tablet (6.25 mg total) by mouth 2 (two) times daily.  cholecalciferol (VITAMIN D) 1000 units tablet Take 2,000 Units by mouth daily.   clobetasol cream (TEMOVATE) 0.05 % Apply 1 application topically 2 (two) times daily as needed (skin irritation).   clonazePAM (KLONOPIN) 0.5 MG tablet Take 0.5 mg by mouth at bedtime as needed (for sleep).    docusate sodium (COLACE) 100 MG capsule as needed.   ENBREL 50 MG/ML injection Inject 50 mg into the skin once a week.    glucose blood (ACCU-CHEK GUIDE) test strip USE AS DIRECTED THREE TIMES DAILY TO  CHECK  BLOOD  GLUCOSE   HUMALOG MIX 50/50 KWIKPEN (50-50) 100 UNIT/ML Kwikpen Inject 35 Units into the skin 2 (two) times daily. 56 Units in AM; 10 Units in the afternoon.   Insulin Pen Needle (B-D UF III MINI PEN NEEDLES) 31G X 5 MM MISC See admin instructions.    loratadine (CLARITIN) 10 MG tablet daily at 6 (six) AM.   nitroGLYCERIN (NITROSTAT) 0.4 MG SL tablet Place 1 tablet (0.4 mg total) under the tongue every 5 (five) minutes x 3 doses as needed for chest pain.   ondansetron (ZOFRAN) 4 MG tablet Take 1 tablet by mouth as needed.   polyethylene glycol (MIRALAX / GLYCOLAX) 17 g packet Take 17 g by mouth daily.   PRADAXA 150 MG CAPS Take 150 mg by mouth 2 (two) times daily.    Probiotic Product (ALIGN PO) Take 1 capsule by mouth daily.   ramipril (ALTACE) 5 MG capsule TAKE ONE CAPSULE BY MOUTH TWICE DAILY   rosuvastatin (CRESTOR) 10 MG tablet Take 10 mg by mouth at bedtime.    spironolactone (ALDACTONE) 25 MG tablet Take 0.5 tablets (12.5 mg total) by mouth daily.   vitamin B-12 (CYANOCOBALAMIN) 250 MCG tablet Take 250 mcg by mouth daily.     Allergies:   Codeine, Penicillins, Acetaminophen-codeine, Atenolol, Atorvastatin, Chloramphenicol, Dapagliflozin, Empagliflozin, Liraglutide, Metformin hcl, Metoprolol, Zetia [ezetimibe], and Sertraline   Social History   Socioeconomic History   Marital status: Married    Spouse name: Jillyn Hidden   Number of children: 3   Years of education: 16   Highest education level: Bachelor's degree (e.g., BA, AB, BS)  Occupational History   Occupation: Retired  Tobacco Use   Smoking status: Never   Smokeless tobacco: Never  Vaping Use   Vaping Use: Never used  Substance and Sexual Activity   Alcohol use: No    Alcohol/week: 0.0 standard drinks   Drug use: No   Sexual activity: Not on file  Other Topics Concern   Not on file  Social History Narrative   Patient is married with 3 children.   Patient is left handed.   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   Patient drinks 3 cups daily.   Social Determinants of Health   Financial Resource Strain: Not on file  Food Insecurity: Not on file  Transportation Needs: Not on file  Physical Activity: Not on file  Stress: Not on file  Social  Connections: Not on file     Family History: The patient's family history includes Heart failure in her mother; Neuromuscular disorder in her father.  ROS:   Please see the history of present illness.    Decreased energy.  Difficulty with ambulating.  No recent falls.  Uses a protective device not currently required.  All other systems reviewed and are negative.  EKGs/Labs/Other Studies Reviewed:    The following studies were reviewed today: 2 Week Monitor: Study Highlights    Patient had  a minimum heart rate of 51 bpm, maximum heart rate of 184 bpm, and average heart rate of 76 bpm. Predominant underlying rhythm was sinus rhythm. Nineteen runs of P-SVT, longest 15 beats , fastest 184 bpm. Isolated PACs were rare (<1.0%). Isolated PVCs were occasional (1.0%). Triggered and diary events associated with sinus rhythm or PVC.   No evidence of arrhythmogenic syncope.  2 D Doppler ECHOCARDIOGRAM 02/26/2022 IMPRESSIONS   1. Left ventricular ejection fraction, by estimation, is 55 to 60%. The  left ventricle has normal function. Left ventricular diastolic parameters  are consistent with Grade I diastolic dysfunction (impaired relaxation).   2. Right ventricular systolic function is normal. The right ventricular  size is normal. Tricuspid regurgitation signal is inadequate for assessing  PA pressure.   3. Severe mitral annular calcification.   4. Aortic valve regurgitation is not visualized. Aortic valve  sclerosis/calcification is present, without any evidence of aortic  stenosis.   5. The inferior vena cava is normal in size with greater than 50%  respiratory variability, suggesting right atrial pressure of 3 mmHg.   Comparison(s): No significant change from prior study. 07/20/20 EF 60-65%.   EKG:  EKG not repeated  Recent Labs: No results found for requested labs within last 8760 hours.  Recent Lipid Panel    Component Value Date/Time   CHOL 137 08/05/2014 0453   TRIG 105  08/05/2014 0453   HDL 65 08/05/2014 0453   CHOLHDL 2.1 08/05/2014 0453   VLDL 21 08/05/2014 0453   LDLCALC 51 08/05/2014 0453    Physical Exam:    VS:  BP 118/64   Pulse 73   Ht 5\' 2"  (1.575 m)   Wt 203 lb 12.8 oz (92.4 kg)   SpO2 97%   BMI 37.28 kg/m     Wt Readings from Last 3 Encounters:  05/21/22 203 lb 12.8 oz (92.4 kg)  02/15/22 202 lb (91.6 kg)  01/08/22 201 lb (91.2 kg)     GEN: Morbidly obese. No acute distress HEENT: Normal NECK: No JVD. LYMPHATICS: No lymphadenopathy CARDIAC: No no murmur. RRR no gallop, or edema. VASCULAR:  Normal Pulses. No bruits. RESPIRATORY:  Clear to auscultation without rales, wheezing or rhonchi  ABDOMEN: Soft, non-tender, non-distended, No pulsatile mass, MUSCULOSKELETAL: No deformity  SKIN: Warm and dry NEUROLOGIC:  Alert and oriented x 3 PSYCHIATRIC:  Normal affect   ASSESSMENT:    1. Chronic diastolic heart failure (HCC)   2. Coronary artery disease involving native coronary artery of native heart without angina pectoris   3. Long term current use of anticoagulant therapy   4. Left bundle branch block   5. Essential hypertension   6. Paroxysmal atrial fibrillation (HCC)    PLAN:    In order of problems listed above:  Continue current therapy.  SGLT2 therapy did not help with dyspnea.  She continues on Aldactone 12.5 mg/day and ramipril 5 mg/day.  LV function was normal with grade 1 diastolic dysfunction noted on echo in February. Continue carvedilol 6.25 mg twice daily.  This medication may need to be further decreased.  On lower dose carvedilol, she had some nonsustained SVT.  PVC burden was very low.  Longest run of SVT was 15 beats. Continue dabigatran 150 mg twice daily No discussion Blood pressure is relatively low at her age.  Next antihypertensive to go may be Altace or Aldactone. No atrial fibrillation on the monitor.  Plan clinical observation.  If dizziness and weakness continue, discontinue the Altace and  consider  further reducing carvedilol dose.  An alternative might also be removing spironolactone altogether.  45-month follow-up   Medication Adjustments/Labs and Tests Ordered: Current medicines are reviewed at length with the patient today.  Concerns regarding medicines are outlined above.  No orders of the defined types were placed in this encounter.  No orders of the defined types were placed in this encounter.   Patient Instructions  Medication Instructions:  Your physician recommends that you continue on your current medications as directed. Please refer to the Current Medication list given to you today.  *If you need a refill on your cardiac medications before your next appointment, please call your pharmacy*  Lab Work: NONE  Testing/Procedures: NONE  Follow-Up: At [facility], you and your health needs are our priority.  As part of our continuing mission to provide you with exceptional heart care, we have created designated Provider Care Teams.  These Care Teams include your primary Cardiologist (physician) and Advanced Practice Providers (APPs -  Physician Assistants and Nurse Practitioners) who all work together to provide you with the care you need, when you need it.  Your next appointment:   6 month(s)  The format for your next appointment:   In Person  Provider:   Lesleigh Noe, MD {   Important Information About Sugar         Signed, Lesleigh Noe, MD  05/21/2022 5:01 PM    [facility]

## 2022-05-21 ENCOUNTER — Encounter: Payer: Self-pay | Admitting: Interventional Cardiology

## 2022-05-21 ENCOUNTER — Ambulatory Visit (INDEPENDENT_AMBULATORY_CARE_PROVIDER_SITE_OTHER): Payer: Medicare Other | Admitting: Interventional Cardiology

## 2022-05-21 VITALS — BP 118/64 | HR 73 | Ht 62.0 in | Wt 203.8 lb

## 2022-05-21 DIAGNOSIS — I447 Left bundle-branch block, unspecified: Secondary | ICD-10-CM | POA: Diagnosis not present

## 2022-05-21 DIAGNOSIS — I2510 Atherosclerotic heart disease of native coronary artery without angina pectoris: Secondary | ICD-10-CM

## 2022-05-21 DIAGNOSIS — I10 Essential (primary) hypertension: Secondary | ICD-10-CM

## 2022-05-21 DIAGNOSIS — I5032 Chronic diastolic (congestive) heart failure: Secondary | ICD-10-CM

## 2022-05-21 DIAGNOSIS — I480 Paroxysmal atrial fibrillation: Secondary | ICD-10-CM | POA: Diagnosis not present

## 2022-05-21 DIAGNOSIS — Z7901 Long term (current) use of anticoagulants: Secondary | ICD-10-CM

## 2022-05-21 NOTE — Patient Instructions (Signed)
Medication Instructions:  Your physician recommends that you continue on your current medications as directed. Please refer to the Current Medication list given to you today.  *If you need a refill on your cardiac medications before your next appointment, please call your pharmacy*  Lab Work: NONE  Testing/Procedures: NONE  Follow-Up: At [facility], you and your health needs are our priority.  As part of our continuing mission to provide you with exceptional heart care, we have created designated Provider Care Teams.  These Care Teams include your primary Cardiologist (physician) and Advanced Practice Providers (APPs -  Physician Assistants and Nurse Practitioners) who all work together to provide you with the care you need, when you need it.  Your next appointment:   6 month(s)  The format for your next appointment:   In Person  Provider:   Henry W Smith III, MD {  Important Information About Sugar       

## 2022-06-21 ENCOUNTER — Other Ambulatory Visit: Payer: Self-pay | Admitting: Interventional Cardiology

## 2022-06-25 ENCOUNTER — Telehealth: Payer: Self-pay

## 2022-06-25 MED ORDER — ISOSORBIDE MONONITRATE ER 30 MG PO TB24: 15.0000 mg | ORAL_TABLET | Freq: Every day | ORAL | 3 refills | Status: DC

## 2022-06-25 MED ORDER — NITROGLYCERIN 0.4 MG SL SUBL: 0.4000 mg | SUBLINGUAL_TABLET | SUBLINGUAL | 3 refills | Status: DC | PRN

## 2022-06-25 NOTE — Telephone Encounter (Signed)
Spoke with patient and discussed Dr. Michaelle Copas recommendations after his phone call with patient's son.  Per Dr. Katrinka Blazing: Start isosorbide mononitrate 15 mg/day.  Sublingual nitroglycerin 0.4 mg for episodes of chest pain lasting longer than 5 minutes.  She will only take while sitting and to remain seated for at least 5 minutes after the nitroglycerin is used.  Monitor response.  Office visit on Thursday with EKG.  Patient states she occasionally has a "heaviness in her chest" that comes and goes since her heart surgery in 2017.  Isosorbide MN 15mg  QD ordered and sent to pharmacy of choice. Refilled NTG.  OV scheduled for 06/27/22 at 2:40PM  Patient verbalized understanding of the above.

## 2022-06-26 NOTE — Progress Notes (Signed)
Cardiology Office Note:    Date:  06/27/2022   ID:  [name], DOB 14-Sep-1934, MRN [id number]  PCP:  [name], MD  Cardiologist:  Lesleigh Noe, MD   Referring MD: [name], MD   Chief Complaint  Patient presents with   Coronary Artery Disease    Angina    History of Present Illness:    [name] is a 88 y.o. female with a hx of coronary artery disease status post CABG in 2006, paroxysmal atrial fibrillation on chronic anticoagulation with dabigatran, rheumatoid arthritis, diabetes, hypertension, prior stroke, complicated migraines, now c/o LEE and dyspnea for 3 weeks. Started on furosemide 20 mg daily but we discontinued because swelling led to AKI.  Recent episodes of chest tightness. NTG and Imdur prescribed. Here today to assess response and consider further therapeutic and diagnostic options.  She has been taking 15 mg of Imdur over the past 2 days.  During that time she has had 1 episode of discomfort in the chest/tightness.  She was able to walk in to the office today without discomfort.  Not short of breath.  Been having symptoms for 2 to 3 months.  Past Medical History:  Diagnosis Date   Anginal pain (HCC)    "saw doctor-think it was esophagus spasm"   Arthritis    Atrial flutter (HCC) 11/02/2013   Cataract    left eye   Coronary artery disease    Diabetes mellitus without complication (HCC)    type 2   Essential hypertension, benign 11/02/2013   H/O jaundice    as child   Headache 2015   complicated migraine x2    Hearing trouble    Heart disease    Hepatitis    hx of hepatitis A as child   High cholesterol    under control   Long term (current) use of anticoagulants 11/02/2013   Pradaxa    Measles    as child   Mouth dryness    Mumps    as teenager   Numbness and tingling    lips   Osteoporosis    "unsure-maybe in knees"   Psoriasis    Psoriatic arthritis (HCC)    Stroke (HCC) 2012   Vertigo    being monitored,  occasional    Past Surgical History:  Procedure Laterality Date   APPENDECTOMY  as child   CARDIAC CATHETERIZATION  ~2006   clear   CORONARY ARTERY BYPASS GRAFT  02/16/2005   LUMBAR DISC SURGERY  09/2004   L1-L5   TONSILLECTOMY  as child   with adenoids   TOTAL HIP ARTHROPLASTY Left 04/19/2019   Procedure: TOTAL HIP ARTHROPLASTY ANTERIOR APPROACH;  Surgeon: Ollen Gross, MD;  Location: [facility];  Service: Orthopedics;  Laterality: Left;   TOTAL KNEE ARTHROPLASTY Left 09/11/2015   Procedure: LEFT TOTAL KNEE ARTHROPLASTY;  Surgeon: Ollen Gross, MD;  Location: [facility];  Service: Orthopedics;  Laterality: Left;    Current Medications: Current Meds  Medication Sig   Accu-Chek FastClix Lancets MISC .   acetaminophen (TYLENOL) 650 MG CR tablet Take 650 mg by mouth as needed.   B-D UF III MINI PEN NEEDLES 31G X 5 MM MISC SMARTSIG:Pre-Filled Pen Syringe SUB-Q As Directed   calcium elemental as carbonate (BARIATRIC TUMS ULTRA) 400 MG chewable tablet Chew 1,000 mg by mouth every evening.   carvedilol (COREG) 6.25 MG tablet Take 1 tablet (6.25 mg total) by mouth 2 (two) times daily.   cholecalciferol (VITAMIN D)  1000 units tablet Take 2,000 Units by mouth daily.   clobetasol cream (TEMOVATE) 0.05 % Apply 1 application topically 2 (two) times daily as needed (skin irritation).   clonazePAM (KLONOPIN) 0.5 MG tablet Take 0.5 mg by mouth at bedtime. Pt takes 0.5mg  at bedtime and then a second dose .25mg  tablet if she wakes up in the middle of the night .   docusate sodium (COLACE) 100 MG capsule as needed.   ENBREL 50 MG/ML injection Inject 50 mg into the skin once a week.    glucose blood (ACCU-CHEK GUIDE) test strip USE AS DIRECTED THREE TIMES DAILY TO  CHECK  BLOOD  GLUCOSE   HUMALOG MIX 50/50 KWIKPEN (50-50) 100 UNIT/ML Kwikpen Inject 35 Units into the skin 2 (two) times daily. 56 Units in AM; 10 Units in the afternoon.   Insulin Pen Needle (B-D UF III MINI PEN NEEDLES) 31G X 5 MM MISC See admin  instructions.   isosorbide mononitrate (IMDUR) 30 MG 24 hr tablet Take 0.5 tablets (15 mg total) by mouth daily.   loratadine (CLARITIN) 10 MG tablet daily at 6 (six) AM.   nitroGLYCERIN (NITROSTAT) 0.4 MG SL tablet Place 1 tablet (0.4 mg total) under the tongue every 5 (five) minutes x 3 doses as needed for chest pain.   ondansetron (ZOFRAN) 4 MG tablet Take 1 tablet by mouth as needed.   polyethylene glycol (MIRALAX / GLYCOLAX) 17 g packet Take 17 g by mouth daily.   PRADAXA 150 MG CAPS Take 150 mg by mouth 2 (two) times daily.    Probiotic Product (ALIGN PO) Take 1 capsule by mouth daily.   ramipril (ALTACE) 5 MG capsule TAKE ONE CAPSULE BY MOUTH TWICE DAILY   rosuvastatin (CRESTOR) 10 MG tablet Take 10 mg by mouth at bedtime.    spironolactone (ALDACTONE) 25 MG tablet TAKE 1/2 TABLET BY MOUTH DAILY   vitamin B-12 (CYANOCOBALAMIN) 250 MCG tablet Take 250 mcg by mouth daily.     Allergies:   Codeine, Penicillins, Acetaminophen-codeine, Atenolol, Atorvastatin, Chloramphenicol, Dapagliflozin, Empagliflozin, Liraglutide, Metformin hcl, Metoprolol, Zetia [ezetimibe], and Sertraline   Social History   Socioeconomic History   Marital status: Married    Spouse name: Jillyn Hidden   Number of children: 3   Years of education: 16   Highest education level: Bachelor's degree (e.g., BA, AB, BS)  Occupational History   Occupation: Retired  Tobacco Use   Smoking status: Never   Smokeless tobacco: Never  Vaping Use   Vaping Use: Never used  Substance and Sexual Activity   Alcohol use: No    Alcohol/week: 0.0 standard drinks of alcohol   Drug use: No   Sexual activity: Not on file  Other Topics Concern   Not on file  Social History Narrative   Patient is married with 3 children.   Patient is left handed.   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   Patient drinks 3 cups daily.   Social Determinants of Health   Financial Resource Strain: Not on file  Food Insecurity: Not on file   Transportation Needs: Not on file  Physical Activity: Not on file  Stress: Not on file  Social Connections: Not on file     Family History: The patient's family history includes Heart failure in her mother; Neuromuscular disorder in her father.  ROS:   Please see the history of present illness.    Hard of hearing all other systems reviewed and are negative.  EKGs/Labs/Other Studies Reviewed:  The following studies were reviewed today:  3-14 Day Monitor 02/15/2022 Study Highlights    Patient had a minimum heart rate of 51 bpm, maximum heart rate of 184 bpm, and average heart rate of 76 bpm. Predominant underlying rhythm was sinus rhythm. Nineteen runs of P-SVT, longest 15 beats , fastest 184 bpm. Isolated PACs were rare (<1.0%). Isolated PVCs were occasional (1.0%). Triggered and diary events associated with sinus rhythm or PVC.   No evidence of arrhythmogenic syncope.  EKG:  EKG first-degree AV block, left bundle branch block, and no change compared to March 2023.  Recent Labs: No results found for requested labs within last 365 days.  Recent Lipid Panel    Component Value Date/Time   CHOL 137 08/05/2014 0453   TRIG 105 08/05/2014 0453   HDL 65 08/05/2014 0453   CHOLHDL 2.1 08/05/2014 0453   VLDL 21 08/05/2014 0453   LDLCALC 51 08/05/2014 0453    Physical Exam:    VS:  BP 98/60   Pulse 72   Ht 5\' 2"  (1.575 m)   Wt 202 lb 3.2 oz (91.7 kg)   SpO2 97%   BMI 36.98 kg/m     Wt Readings from Last 3 Encounters:  06/27/22 202 lb 3.2 oz (91.7 kg)  05/21/22 203 lb 12.8 oz (92.4 kg)  02/15/22 202 lb (91.6 kg)     GEN: Obese. No acute distress HEENT: Normal NECK: No JVD. LYMPHATICS: No lymphadenopathy CARDIAC: No murmur. RRR no gallop, or edema. VASCULAR:  Normal Pulses. No bruits. RESPIRATORY:  Clear to auscultation without rales, wheezing or rhonchi  ABDOMEN: Soft, non-tender, non-distended, No pulsatile mass, MUSCULOSKELETAL: No deformity  SKIN: Warm  and dry NEUROLOGIC:  Alert and oriented x 3 PSYCHIATRIC:  Normal affect   ASSESSMENT:    1. Coronary artery disease involving coronary bypass graft of native heart with unstable angina pectoris (HCC)   2. Chronic diastolic heart failure (HCC)   3. Long term current use of anticoagulant therapy   4. Left bundle branch block   5. Essential hypertension   6. Paroxysmal atrial fibrillation (HCC)    PLAN:    In order of problems listed above:  Continue isosorbide 15 mg/day.  Blood pressure is relatively soft.  If she continues to have angina we will increase it to 30 mg/day.  Also instructed use nitroglycerin while seated if she has discomfort to relieve acute episodes of discomfort.  She should return to see me in 1 month for reassessment.  She will return for clinical reassessment in a month. No clinical volume overload Continue Pradaxa Left bundle branch block with first-degree AV block.  No change compared to March. Blood pressure is relatively soft today with the patient sitting at 120 mmHg systolic.  We do have room to increase isosorbide to 30 mg/day but would not do this until we get some record of how much sublingual nitroglycerin is being used.   Medication Adjustments/Labs and Tests Ordered: Current medicines are reviewed at length with the patient today.  Concerns regarding medicines are outlined above.  Orders Placed This Encounter  Procedures   EKG 12-Lead   No orders of the defined types were placed in this encounter.   Patient Instructions  Medication Instructions:  Your physician recommends that you continue on your current medications as directed. Please refer to the Current Medication list given to you today.  *If you need a refill on your cardiac medications before your next appointment, please call your pharmacy*  Lab Work:  NONE  Testing/Procedures: NONE  Follow-Up: At [facility], you and your health needs are our priority.  As part of our continuing  mission to provide you with exceptional heart care, we have created designated Provider Care Teams.  These Care Teams include your primary Cardiologist (physician) and Advanced Practice Providers (APPs -  Physician Assistants and Nurse Practitioners) who all work together to provide you with the care you need, when you need it.  Your next appointment:   1 month(s)  The format for your next appointment:   In Person  Provider:   Lesleigh Noe, MD {   Important Information About Sugar         Signed, Lesleigh Noe, MD  06/27/2022 3:36 PM    [facility]

## 2022-06-27 ENCOUNTER — Ambulatory Visit (INDEPENDENT_AMBULATORY_CARE_PROVIDER_SITE_OTHER): Payer: Medicare Other | Admitting: Interventional Cardiology

## 2022-06-27 ENCOUNTER — Encounter: Payer: Self-pay | Admitting: Interventional Cardiology

## 2022-06-27 VITALS — BP 98/60 | HR 72 | Ht 62.0 in | Wt 202.2 lb

## 2022-06-27 DIAGNOSIS — I480 Paroxysmal atrial fibrillation: Secondary | ICD-10-CM | POA: Diagnosis not present

## 2022-06-27 DIAGNOSIS — I447 Left bundle-branch block, unspecified: Secondary | ICD-10-CM

## 2022-06-27 DIAGNOSIS — I5032 Chronic diastolic (congestive) heart failure: Secondary | ICD-10-CM | POA: Diagnosis not present

## 2022-06-27 DIAGNOSIS — Z7901 Long term (current) use of anticoagulants: Secondary | ICD-10-CM | POA: Diagnosis not present

## 2022-06-27 DIAGNOSIS — I25700 Atherosclerosis of coronary artery bypass graft(s), unspecified, with unstable angina pectoris: Secondary | ICD-10-CM | POA: Diagnosis not present

## 2022-06-27 DIAGNOSIS — I10 Essential (primary) hypertension: Secondary | ICD-10-CM | POA: Diagnosis not present

## 2022-06-27 NOTE — Patient Instructions (Signed)
Medication Instructions:  Your physician recommends that you continue on your current medications as directed. Please refer to the Current Medication list given to you today.  *If you need a refill on your cardiac medications before your next appointment, please call your pharmacy*  Lab Work: NONE  Testing/Procedures: NONE  Follow-Up: At [facility], you and your health needs are our priority.  As part of our continuing mission to provide you with exceptional heart care, we have created designated Provider Care Teams.  These Care Teams include your primary Cardiologist (physician) and Advanced Practice Providers (APPs -  Physician Assistants and Nurse Practitioners) who all work together to provide you with the care you need, when you need it.  Your next appointment:   1 month(s)  The format for your next appointment:   In Person  Provider:   Sinclair Grooms, MD {   Important Information About Sugar

## 2022-07-01 DIAGNOSIS — I480 Paroxysmal atrial fibrillation: Secondary | ICD-10-CM | POA: Diagnosis not present

## 2022-07-01 DIAGNOSIS — E7800 Pure hypercholesterolemia, unspecified: Secondary | ICD-10-CM | POA: Diagnosis not present

## 2022-07-01 DIAGNOSIS — E11319 Type 2 diabetes mellitus with unspecified diabetic retinopathy without macular edema: Secondary | ICD-10-CM | POA: Diagnosis not present

## 2022-07-01 DIAGNOSIS — I10 Essential (primary) hypertension: Secondary | ICD-10-CM | POA: Diagnosis not present

## 2022-07-01 DIAGNOSIS — F320 Major depressive disorder, single episode, mild: Secondary | ICD-10-CM | POA: Diagnosis not present

## 2022-07-01 DIAGNOSIS — K219 Gastro-esophageal reflux disease without esophagitis: Secondary | ICD-10-CM | POA: Diagnosis not present

## 2022-07-09 DIAGNOSIS — I10 Essential (primary) hypertension: Secondary | ICD-10-CM | POA: Diagnosis not present

## 2022-07-09 DIAGNOSIS — I209 Angina pectoris, unspecified: Secondary | ICD-10-CM | POA: Diagnosis not present

## 2022-07-09 DIAGNOSIS — Z6836 Body mass index (BMI) 36.0-36.9, adult: Secondary | ICD-10-CM | POA: Diagnosis not present

## 2022-07-09 DIAGNOSIS — Z79899 Other long term (current) drug therapy: Secondary | ICD-10-CM | POA: Diagnosis not present

## 2022-07-09 DIAGNOSIS — D6869 Other thrombophilia: Secondary | ICD-10-CM | POA: Diagnosis not present

## 2022-07-09 DIAGNOSIS — I480 Paroxysmal atrial fibrillation: Secondary | ICD-10-CM | POA: Diagnosis not present

## 2022-07-31 NOTE — Progress Notes (Unsigned)
Cardiology Office Note:    Date:  08/01/2022   ID:  [name], DOB [birth year], MRN [id number]  PCP:  [name], MD  Cardiologist:  Lesleigh Noe, MD   Referring MD: [name], MD   Chief Complaint  Patient presents with   Coronary Artery Disease   Hyperlipidemia    History of Present Illness:    [name] is a 88 y.o. female with a hx of coronary artery disease status post CABG in 2006, paroxysmal atrial fibrillation on chronic anticoagulation with dabigatran, rheumatoid arthritis, diabetes, hypertension, prior stroke, complicated migraines, now c/o LEE and dyspnea for 3 weeks. Started on furosemide 20 mg daily but we discontinued because swelling led to AKI.  Follow-up angina and addition of low dose IMDUR.  Since adding the Imdur, she is not really had noticeable chest discomfort.  She denies dizziness or lightheadedness.  No shortness of breath or excessive complaints.  Past Medical History:  Diagnosis Date   Anginal pain (HCC)    "saw doctor-think it was esophagus spasm"   Arthritis    Atrial flutter (HCC) 11/02/2013   Cataract    left eye   Coronary artery disease    Diabetes mellitus without complication (HCC)    type 2   Essential hypertension, benign 11/02/2013   H/O jaundice    as child   Headache 2015   complicated migraine x2    Hearing trouble    Heart disease    Hepatitis    hx of hepatitis A as child   High cholesterol    under control   Long term (current) use of anticoagulants 11/02/2013   Pradaxa    Measles    as child   Mouth dryness    Mumps    as teenager   Numbness and tingling    lips   Osteoporosis    "unsure-maybe in knees"   Psoriasis    Psoriatic arthritis (HCC)    Stroke (HCC) 2012   Vertigo    being monitored, occasional    Past Surgical History:  Procedure Laterality Date   APPENDECTOMY  as child   CARDIAC CATHETERIZATION  ~2006   clear   CORONARY ARTERY BYPASS GRAFT  02/16/2005   LUMBAR DISC  SURGERY  09/2004   L1-L5   TONSILLECTOMY  as child   with adenoids   TOTAL HIP ARTHROPLASTY Left 04/19/2019   Procedure: TOTAL HIP ARTHROPLASTY ANTERIOR APPROACH;  Surgeon: Ollen Gross, MD;  Location: [facility];  Service: Orthopedics;  Laterality: Left;   TOTAL KNEE ARTHROPLASTY Left 09/11/2015   Procedure: LEFT TOTAL KNEE ARTHROPLASTY;  Surgeon: Ollen Gross, MD;  Location: [facility];  Service: Orthopedics;  Laterality: Left;    Current Medications: Current Meds  Medication Sig   Accu-Chek FastClix Lancets MISC .   acetaminophen (TYLENOL) 650 MG CR tablet Take 650 mg by mouth as needed.   B-D UF III MINI PEN NEEDLES 31G X 5 MM MISC SMARTSIG:Pre-Filled Pen Syringe SUB-Q As Directed   calcium elemental as carbonate (BARIATRIC TUMS ULTRA) 400 MG chewable tablet Chew 1,000 mg by mouth every evening.   carvedilol (COREG) 6.25 MG tablet Take 1 tablet (6.25 mg total) by mouth 2 (two) times daily.   cholecalciferol (VITAMIN D) 1000 units tablet Take 2,000 Units by mouth daily.   clobetasol cream (TEMOVATE) 0.05 % Apply 1 application topically 2 (two) times daily as needed (skin irritation).   clonazePAM (KLONOPIN) 0.5 MG tablet Take 0.5 mg by mouth at bedtime.  Pt takes 0.5mg  at bedtime and then a second dose .25mg  tablet if she wakes up in the middle of the night .   Continuous Blood Gluc Sensor (FREESTYLE LIBRE 2 SENSOR) MISC as directed.   docusate sodium (COLACE) 100 MG capsule as needed.   ENBREL 50 MG/ML injection Inject 50 mg into the skin once a week.    glucose blood (ACCU-CHEK GUIDE) test strip USE AS DIRECTED THREE TIMES DAILY TO  CHECK  BLOOD  GLUCOSE   HUMALOG MIX 50/50 KWIKPEN (50-50) 100 UNIT/ML Kwikpen Inject 35 Units into the skin 2 (two) times daily. 56 Units in AM; 10 Units in the afternoon.   Insulin Pen Needle (B-D UF III MINI PEN NEEDLES) 31G X 5 MM MISC See admin instructions.   isosorbide mononitrate (IMDUR) 30 MG 24 hr tablet Take 0.5 tablets (15 mg total) by mouth daily.    loratadine (CLARITIN) 10 MG tablet daily at 6 (six) AM.   nitroGLYCERIN (NITROSTAT) 0.4 MG SL tablet Place 1 tablet (0.4 mg total) under the tongue every 5 (five) minutes x 3 doses as needed for chest pain.   ondansetron (ZOFRAN) 4 MG tablet Take 1 tablet by mouth as needed.   polyethylene glycol (MIRALAX / GLYCOLAX) 17 g packet Take 17 g by mouth daily.   PRADAXA 150 MG CAPS Take 150 mg by mouth 2 (two) times daily.    Probiotic Product (ALIGN PO) Take 1 capsule by mouth daily.   ramipril (ALTACE) 5 MG capsule TAKE ONE CAPSULE BY MOUTH TWICE DAILY   rosuvastatin (CRESTOR) 10 MG tablet Take 10 mg by mouth at bedtime.    spironolactone (ALDACTONE) 25 MG tablet TAKE 1/2 TABLET BY MOUTH DAILY   vitamin B-12 (CYANOCOBALAMIN) 250 MCG tablet Take 250 mcg by mouth daily.     Allergies:   Codeine, Penicillins, Acetaminophen-codeine, Atenolol, Atorvastatin, Chloramphenicol, Dapagliflozin, Empagliflozin, Liraglutide, Metformin hcl, Metoprolol, Zetia [ezetimibe], and Sertraline   Social History   Socioeconomic History   Marital status: Married    Spouse name: Jillyn Hidden   Number of children: 3   Years of education: 16   Highest education level: Bachelor's degree (e.g., BA, AB, BS)  Occupational History   Occupation: Retired  Tobacco Use   Smoking status: Never   Smokeless tobacco: Never  Vaping Use   Vaping Use: Never used  Substance and Sexual Activity   Alcohol use: No    Alcohol/week: 0.0 standard drinks of alcohol   Drug use: No   Sexual activity: Not on file  Other Topics Concern   Not on file  Social History Narrative   Patient is married with 3 children.   Patient is left handed.   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   Patient drinks 3 cups daily.   Social Determinants of Health   Financial Resource Strain: Not on file  Food Insecurity: Not on file  Transportation Needs: Not on file  Physical Activity: Not on file  Stress: Not on file  Social Connections: Not on  file     Family History: The patient's family history includes Heart failure in her mother; Neuromuscular disorder in her father.  ROS:   Please see the history of present illness.    No complaints other than decreased hearing, decreased energy.  All other systems reviewed and are negative.  EKGs/Labs/Other Studies Reviewed:    The following studies were reviewed today: No imaging or testing  EKG:  EKG not performed  Recent Labs: No results found  for requested labs within last 365 days.  Recent Lipid Panel    Component Value Date/Time   CHOL 137 08/05/2014 0453   TRIG 105 08/05/2014 0453   HDL 65 08/05/2014 0453   CHOLHDL 2.1 08/05/2014 0453   VLDL 21 08/05/2014 0453   LDLCALC 51 08/05/2014 0453    Physical Exam:    VS:  BP 100/60   Pulse 79   Ht 5\' 2"  (1.575 m)   Wt 202 lb 12.8 oz (92 kg)   SpO2 95%   BMI 37.09 kg/m     Wt Readings from Last 3 Encounters:  08/01/22 202 lb 12.8 oz (92 kg)  06/27/22 202 lb 3.2 oz (91.7 kg)  05/21/22 203 lb 12.8 oz (92.4 kg)     GEN: Pale, elderly and overweight. No acute distress HEENT: Normal NECK: No JVD. LYMPHATICS: No lymphadenopathy CARDIAC: Soft left lower sternal systolic murmur. RRR no gallop, or edema. VASCULAR:  Normal Pulses. No bruits. RESPIRATORY:  Clear to auscultation without rales, wheezing or rhonchi  ABDOMEN: Soft, non-tender, non-distended, No pulsatile mass, MUSCULOSKELETAL: No deformity  SKIN: Warm and dry NEUROLOGIC:  Alert and oriented x 3 PSYCHIATRIC:  Normal affect   ASSESSMENT:    1. Coronary artery disease involving coronary bypass graft of native heart with unstable angina pectoris (HCC)   2. Coronary artery disease involving native coronary artery of native heart without angina pectoris   3. Chronic diastolic heart failure (HCC)   4. Left bundle branch block   5. Long term current use of anticoagulant therapy   6. Paroxysmal atrial fibrillation (HCC)   7. Essential hypertension    PLAN:     In order of problems listed above:  Continue isosorbide 15 mg/day.  Continue to observe for any recurrence of angina.  Blood pressure is relatively low and therefore have not increased the dose.  If we need to further increase the dose may consider discontinuing Altace and already cutting back on the dose of Aldactone. Secondary prevention discussed No volume overload No new data Continue Pradaxa and monitor for bleeding Past history but regular rhythm currently Blood pressure 110/68 mmHg.   Medication Adjustments/Labs and Tests Ordered: Current medicines are reviewed at length with the patient today.  Concerns regarding medicines are outlined above.  No orders of the defined types were placed in this encounter.  No orders of the defined types were placed in this encounter.   Patient Instructions  Medication Instructions:  Your physician recommends that you continue on your current medications as directed. Please refer to the Current Medication list given to you today.  *If you need a refill on your cardiac medications before your next appointment, please call your pharmacy*  Lab Work: NONE  Testing/Procedures: NONE  Follow-Up: At [facility], you and your health needs are our priority.  As part of our continuing mission to provide you with exceptional heart care, we have created designated Provider Care Teams.  These Care Teams include your primary Cardiologist (physician) and Advanced Practice Providers (APPs -  Physician Assistants and Nurse Practitioners) who all work together to provide you with the care you need, when you need it.  Your next appointment:   6-63month(s)  The format for your next appointment:   In Person  Provider:   Lesleigh Noe, MD {   Important Information About Sugar         Signed, Lesleigh Noe, MD  08/01/2022 10:40 AM    [facility]

## 2022-08-01 ENCOUNTER — Encounter: Payer: Self-pay | Admitting: Interventional Cardiology

## 2022-08-01 ENCOUNTER — Ambulatory Visit (INDEPENDENT_AMBULATORY_CARE_PROVIDER_SITE_OTHER): Payer: Medicare Other | Admitting: Interventional Cardiology

## 2022-08-01 VITALS — BP 100/60 | HR 79 | Ht 62.0 in | Wt 202.8 lb

## 2022-08-01 DIAGNOSIS — I5032 Chronic diastolic (congestive) heart failure: Secondary | ICD-10-CM | POA: Diagnosis not present

## 2022-08-01 DIAGNOSIS — Z7901 Long term (current) use of anticoagulants: Secondary | ICD-10-CM

## 2022-08-01 DIAGNOSIS — I2510 Atherosclerotic heart disease of native coronary artery without angina pectoris: Secondary | ICD-10-CM | POA: Diagnosis not present

## 2022-08-01 DIAGNOSIS — I447 Left bundle-branch block, unspecified: Secondary | ICD-10-CM

## 2022-08-01 DIAGNOSIS — I25700 Atherosclerosis of coronary artery bypass graft(s), unspecified, with unstable angina pectoris: Secondary | ICD-10-CM | POA: Diagnosis not present

## 2022-08-01 DIAGNOSIS — I480 Paroxysmal atrial fibrillation: Secondary | ICD-10-CM

## 2022-08-01 DIAGNOSIS — I10 Essential (primary) hypertension: Secondary | ICD-10-CM | POA: Diagnosis not present

## 2022-08-01 NOTE — Patient Instructions (Signed)
Medication Instructions:  Your physician recommends that you continue on your current medications as directed. Please refer to the Current Medication list given to you today.  *If you need a refill on your cardiac medications before your next appointment, please call your pharmacy*  Lab Work: NONE  Testing/Procedures: NONE  Follow-Up: At [facility], you and your health needs are our priority.  As part of our continuing mission to provide you with exceptional heart care, we have created designated Provider Care Teams.  These Care Teams include your primary Cardiologist (physician) and Advanced Practice Providers (APPs -  Physician Assistants and Nurse Practitioners) who all work together to provide you with the care you need, when you need it.  Your next appointment:   6-45months)  The format for your next appointment:   In Person  Provider:   HSinclair Grooms MD {   Important Information About Sugar

## 2022-08-09 DIAGNOSIS — Z79899 Other long term (current) drug therapy: Secondary | ICD-10-CM | POA: Diagnosis not present

## 2022-08-09 DIAGNOSIS — E11319 Type 2 diabetes mellitus with unspecified diabetic retinopathy without macular edema: Secondary | ICD-10-CM | POA: Diagnosis not present

## 2022-08-13 DIAGNOSIS — E11319 Type 2 diabetes mellitus with unspecified diabetic retinopathy without macular edema: Secondary | ICD-10-CM | POA: Diagnosis not present

## 2022-08-13 DIAGNOSIS — I2510 Atherosclerotic heart disease of native coronary artery without angina pectoris: Secondary | ICD-10-CM | POA: Diagnosis not present

## 2022-08-13 DIAGNOSIS — Z794 Long term (current) use of insulin: Secondary | ICD-10-CM | POA: Diagnosis not present

## 2022-08-13 DIAGNOSIS — Z8639 Personal history of other endocrine, nutritional and metabolic disease: Secondary | ICD-10-CM | POA: Diagnosis not present

## 2022-08-20 DIAGNOSIS — L4059 Other psoriatic arthropathy: Secondary | ICD-10-CM | POA: Diagnosis not present

## 2022-08-20 DIAGNOSIS — R5383 Other fatigue: Secondary | ICD-10-CM | POA: Diagnosis not present

## 2022-08-20 DIAGNOSIS — M1991 Primary osteoarthritis, unspecified site: Secondary | ICD-10-CM | POA: Diagnosis not present

## 2022-08-20 DIAGNOSIS — L409 Psoriasis, unspecified: Secondary | ICD-10-CM | POA: Diagnosis not present

## 2022-08-20 DIAGNOSIS — Z79899 Other long term (current) drug therapy: Secondary | ICD-10-CM | POA: Diagnosis not present

## 2022-08-20 DIAGNOSIS — E669 Obesity, unspecified: Secondary | ICD-10-CM | POA: Diagnosis not present

## 2022-08-20 DIAGNOSIS — Z6837 Body mass index (BMI) 37.0-37.9, adult: Secondary | ICD-10-CM | POA: Diagnosis not present

## 2022-09-27 DIAGNOSIS — I10 Essential (primary) hypertension: Secondary | ICD-10-CM | POA: Diagnosis not present

## 2022-09-27 DIAGNOSIS — K219 Gastro-esophageal reflux disease without esophagitis: Secondary | ICD-10-CM | POA: Diagnosis not present

## 2022-09-27 DIAGNOSIS — F320 Major depressive disorder, single episode, mild: Secondary | ICD-10-CM | POA: Diagnosis not present

## 2022-09-27 DIAGNOSIS — E7800 Pure hypercholesterolemia, unspecified: Secondary | ICD-10-CM | POA: Diagnosis not present

## 2022-09-27 DIAGNOSIS — E11319 Type 2 diabetes mellitus with unspecified diabetic retinopathy without macular edema: Secondary | ICD-10-CM | POA: Diagnosis not present

## 2022-09-27 DIAGNOSIS — I480 Paroxysmal atrial fibrillation: Secondary | ICD-10-CM | POA: Diagnosis not present

## 2022-09-27 DIAGNOSIS — I2510 Atherosclerotic heart disease of native coronary artery without angina pectoris: Secondary | ICD-10-CM | POA: Diagnosis not present

## 2022-10-25 DIAGNOSIS — Z23 Encounter for immunization: Secondary | ICD-10-CM | POA: Diagnosis not present

## 2022-11-05 DIAGNOSIS — Z23 Encounter for immunization: Secondary | ICD-10-CM | POA: Diagnosis not present

## 2022-11-12 DIAGNOSIS — Z794 Long term (current) use of insulin: Secondary | ICD-10-CM | POA: Diagnosis not present

## 2022-11-12 DIAGNOSIS — I2510 Atherosclerotic heart disease of native coronary artery without angina pectoris: Secondary | ICD-10-CM | POA: Diagnosis not present

## 2022-11-12 DIAGNOSIS — Z8639 Personal history of other endocrine, nutritional and metabolic disease: Secondary | ICD-10-CM | POA: Diagnosis not present

## 2022-11-12 DIAGNOSIS — E11319 Type 2 diabetes mellitus with unspecified diabetic retinopathy without macular edema: Secondary | ICD-10-CM | POA: Diagnosis not present

## 2022-11-26 NOTE — Progress Notes (Signed)
Cardiology Office Note:    Date:  11/27/2022   ID:  [name], DOB [birth year], MRN [id number]  PCP:  [name], MD  Cardiologist:  Lesleigh Noe, MD   Referring MD: [name], MD   Chief Complaint  Patient presents with   Coronary Artery Disease   Congestive Heart Failure   Atrial Fibrillation    History of Present Illness:    [name] is a 88 y.o. female with a hx of  coronary artery disease status post CABG in 2006, paroxysmal atrial fibrillation on chronic anticoagulation with dabigatran, rheumatoid arthritis, diabetes, hypertension, prior stroke, complicated migraines, now c/o LEE and dyspnea for 3 weeks. Started on furosemide 20 mg daily but we discontinued because swelling led to AKI.    He is doing okay.  She has minimal episodes of angina.  She is having some chest pain but it occurs randomly and lasts less than 30 seconds.  It is migratory throughout the chest.  She has not had dizziness, syncope, and denies orthopnea.  Lower extremity swelling has resolved.  Past Medical History:  Diagnosis Date   Anginal pain (HCC)    "saw doctor-think it was esophagus spasm"   Arthritis    Atrial flutter (HCC) 11/02/2013   Cataract    left eye   Coronary artery disease    Diabetes mellitus without complication (HCC)    type 2   Essential hypertension, benign 11/02/2013   H/O jaundice    as child   Headache 2015   complicated migraine x2    Hearing trouble    Heart disease    Hepatitis    hx of hepatitis A as child   High cholesterol    under control   Long term (current) use of anticoagulants 11/02/2013   Pradaxa    Measles    as child   Mouth dryness    Mumps    as teenager   Numbness and tingling    lips   Osteoporosis    "unsure-maybe in knees"   Psoriasis    Psoriatic arthritis (HCC)    Stroke (HCC) 2012   Vertigo    being monitored, occasional    Past Surgical History:  Procedure Laterality Date   APPENDECTOMY  as child    CARDIAC CATHETERIZATION  ~2006   clear   CORONARY ARTERY BYPASS GRAFT  02/16/2005   LUMBAR DISC SURGERY  09/2004   L1-L5   TONSILLECTOMY  as child   with adenoids   TOTAL HIP ARTHROPLASTY Left 04/19/2019   Procedure: TOTAL HIP ARTHROPLASTY ANTERIOR APPROACH;  Surgeon: Ollen Gross, MD;  Location: [facility];  Service: Orthopedics;  Laterality: Left;   TOTAL KNEE ARTHROPLASTY Left 09/11/2015   Procedure: LEFT TOTAL KNEE ARTHROPLASTY;  Surgeon: Ollen Gross, MD;  Location: [facility];  Service: Orthopedics;  Laterality: Left;    Current Medications: Current Meds  Medication Sig   Accu-Chek FastClix Lancets MISC .   acetaminophen (TYLENOL) 650 MG CR tablet Take 650 mg by mouth as needed.   B-D UF III MINI PEN NEEDLES 31G X 5 MM MISC SMARTSIG:Pre-Filled Pen Syringe SUB-Q As Directed   calcium elemental as carbonate (BARIATRIC TUMS ULTRA) 400 MG chewable tablet Chew 1,000 mg by mouth every evening.   carvedilol (COREG) 6.25 MG tablet Take 1 tablet (6.25 mg total) by mouth 2 (two) times daily.   cholecalciferol (VITAMIN D) 1000 units tablet Take 2,000 Units by mouth daily.   clobetasol cream (TEMOVATE) 0.05 % Apply  1 application topically 2 (two) times daily as needed (skin irritation).   clonazePAM (KLONOPIN) 0.5 MG tablet Take 0.5 mg by mouth at bedtime. Pt takes 0.5mg  at bedtime and then a second dose .25mg  tablet if she wakes up in the middle of the night .   Continuous Blood Gluc Sensor (FREESTYLE LIBRE 2 SENSOR) MISC as directed.   docusate sodium (COLACE) 100 MG capsule as needed.   ENBREL 50 MG/ML injection Inject 50 mg into the skin once a week.    glucose blood (ACCU-CHEK GUIDE) test strip USE AS DIRECTED THREE TIMES DAILY TO  CHECK  BLOOD  GLUCOSE   HUMALOG MIX 50/50 KWIKPEN (50-50) 100 UNIT/ML Kwikpen Inject 35 Units into the skin 2 (two) times daily. 56 Units in AM; 10 Units in the afternoon.   Insulin Pen Needle (B-D UF III MINI PEN NEEDLES) 31G X 5 MM MISC See admin instructions.    isosorbide mononitrate (IMDUR) 30 MG 24 hr tablet Take 0.5 tablets (15 mg total) by mouth daily.   loratadine (CLARITIN) 10 MG tablet daily at 6 (six) AM.   nitroGLYCERIN (NITROSTAT) 0.4 MG SL tablet Place 1 tablet (0.4 mg total) under the tongue every 5 (five) minutes x 3 doses as needed for chest pain.   ondansetron (ZOFRAN) 4 MG tablet Take 1 tablet by mouth as needed.   polyethylene glycol (MIRALAX / GLYCOLAX) 17 g packet Take 17 g by mouth daily.   PRADAXA 150 MG CAPS Take 150 mg by mouth 2 (two) times daily.    Probiotic Product (ALIGN PO) Take 1 capsule by mouth daily.   ramipril (ALTACE) 5 MG capsule TAKE ONE CAPSULE BY MOUTH TWICE DAILY   rosuvastatin (CRESTOR) 10 MG tablet Take 10 mg by mouth at bedtime.    spironolactone (ALDACTONE) 25 MG tablet TAKE 1/2 TABLET BY MOUTH DAILY   vitamin B-12 (CYANOCOBALAMIN) 250 MCG tablet Take 250 mcg by mouth daily.     Allergies:   Codeine, Penicillins, Acetaminophen-codeine, Atenolol, Atorvastatin, Chloramphenicol, Dapagliflozin, Empagliflozin, Liraglutide, Metformin hcl, Metoprolol, Zetia [ezetimibe], and Sertraline   Social History   Socioeconomic History   Marital status: Married    Spouse name: Jillyn Hidden   Number of children: 3   Years of education: 16   Highest education level: Bachelor's degree (e.g., BA, AB, BS)  Occupational History   Occupation: Retired  Tobacco Use   Smoking status: Never   Smokeless tobacco: Never  Vaping Use   Vaping Use: Never used  Substance and Sexual Activity   Alcohol use: No    Alcohol/week: 0.0 standard drinks of alcohol   Drug use: No   Sexual activity: Not on file  Other Topics Concern   Not on file  Social History Narrative   Patient is married with 3 children.   Patient is left handed.   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   Patient drinks 3 cups daily.   Social Determinants of Health   Financial Resource Strain: Not on file  Food Insecurity: Not on file  Transportation  Needs: Not on file  Physical Activity: Not on file  Stress: Not on file  Social Connections: Not on file     Family History: The patient's family history includes Heart failure in her mother; Neuromuscular disorder in her father.  ROS:   Please see the history of present illness.    No particular complaints.  No bleeding on dabigatran.  All other systems reviewed and are negative.  EKGs/Labs/Other Studies Reviewed:  The following studies were reviewed today: No new imaging  EKG:  EKG not repeated  Recent Labs: No results found for requested labs within last 365 days.  Recent Lipid Panel    Component Value Date/Time   CHOL 137 08/05/2014 0453   TRIG 105 08/05/2014 0453   HDL 65 08/05/2014 0453   CHOLHDL 2.1 08/05/2014 0453   VLDL 21 08/05/2014 0453   LDLCALC 51 08/05/2014 0453    Physical Exam:    VS:  BP (!) 104/52   Pulse 66   Ht 5\' 2"  (1.575 m)   Wt 200 lb 12.8 oz (91.1 kg)   SpO2 96%   BMI 36.73 kg/m     Wt Readings from Last 3 Encounters:  11/27/22 200 lb 12.8 oz (91.1 kg)  08/01/22 202 lb 12.8 oz (92 kg)  06/27/22 202 lb 3.2 oz (91.7 kg)     GEN: Elderly and healthy appearing. No acute distress HEENT: Normal NECK: No JVD. LYMPHATICS: No lymphadenopathy CARDIAC: No murmur.  Regular RR in a trigeminal pattern no gallop, or edema. VASCULAR:  Normal Pulses. No bruits. RESPIRATORY:  Clear to auscultation without rales, wheezing or rhonchi  ABDOMEN: Soft, non-tender, non-distended, No pulsatile mass, MUSCULOSKELETAL: No deformity  SKIN: Warm and dry NEUROLOGIC:  Alert and oriented x 3 PSYCHIATRIC:  Normal affect   ASSESSMENT:    1. Coronary artery disease involving native coronary artery of native heart without angina pectoris   2. Long term current use of anticoagulant therapy   3. Paroxysmal atrial fibrillation (HCC)   4. Left bundle branch block   5. Chronic diastolic heart failure (HCC)   6. Essential hypertension    PLAN:    In order of  problems listed above:  Leave angina is stable.  No prolonged episodes.  No sublingual nitroglycerin use.  Continue isosorbide. Continue dabigatran. Not currently in atrial fibrillation based upon exam. No comment May be overdoing therapy for diastolic heart failure and perhaps we need to cut back on diuretic therapy because blood pressure is relatively low for age. She just took her medication early this morning and systolic is running between 105 and 110 mmHg with diastolic 52 mmHg.  They will record some afternoon blood pressures 2 or 3 times over the next 7 days and report to [place].  If she is consistently less than 120 mmHg systolic and if the diastolic remains this low I will likely cut back on her diuretic regimen, perhaps discontinuing Aldactone.  Will continue isosorbide because that seemed to help the anginal complaints that she had.   Wanted a recommendation for cardiologist.  I recommended Dr. Laurance Flatten.   Medication Adjustments/Labs and Tests Ordered: Current medicines are reviewed at length with the patient today.  Concerns regarding medicines are outlined above.  No orders of the defined types were placed in this encounter.  No orders of the defined types were placed in this encounter.   Patient Instructions  Medication Instructions:  Your physician recommends that you continue on your current medications as directed. Please refer to the Current Medication list given to you today.  *If you need a refill on your cardiac medications before your next appointment, please call your pharmacy*  Follow-Up: At [facility], you and your health needs are our priority.  As part of our continuing mission to provide you with exceptional heart care, we have created designated Provider Care Teams.  These Care Teams include your primary Cardiologist (physician) and Advanced Practice Providers (APPs -  Physician Assistants and Nurse Practitioners) who all work together to  provide you with the care you need, when you need it.  Your next appointment:   6 month(s)  The format for your next appointment:   In Person  Provider:   Laurance Flatten, MD  Other Instructions Please check your blood pressure at home 2-3 times per week in the afternoon and call our office at [phone] with a list of your readings in the next week (around December 6th or 7th).  Important Information About Sugar         Signed, Lesleigh Noe, MD  11/27/2022 11:18 AM    [facility]

## 2022-11-27 ENCOUNTER — Ambulatory Visit: Payer: Medicare Other | Attending: Interventional Cardiology | Admitting: Interventional Cardiology

## 2022-11-27 ENCOUNTER — Encounter: Payer: Self-pay | Admitting: Interventional Cardiology

## 2022-11-27 VITALS — BP 104/52 | HR 66 | Ht 62.0 in | Wt 200.8 lb

## 2022-11-27 DIAGNOSIS — I25700 Atherosclerosis of coronary artery bypass graft(s), unspecified, with unstable angina pectoris: Secondary | ICD-10-CM

## 2022-11-27 DIAGNOSIS — I2510 Atherosclerotic heart disease of native coronary artery without angina pectoris: Secondary | ICD-10-CM | POA: Diagnosis not present

## 2022-11-27 DIAGNOSIS — I480 Paroxysmal atrial fibrillation: Secondary | ICD-10-CM | POA: Diagnosis not present

## 2022-11-27 DIAGNOSIS — Z7901 Long term (current) use of anticoagulants: Secondary | ICD-10-CM | POA: Diagnosis not present

## 2022-11-27 DIAGNOSIS — I447 Left bundle-branch block, unspecified: Secondary | ICD-10-CM | POA: Diagnosis not present

## 2022-11-27 DIAGNOSIS — I5032 Chronic diastolic (congestive) heart failure: Secondary | ICD-10-CM | POA: Diagnosis not present

## 2022-11-27 DIAGNOSIS — I10 Essential (primary) hypertension: Secondary | ICD-10-CM | POA: Insufficient documentation

## 2022-11-27 NOTE — Patient Instructions (Addendum)
Medication Instructions:  Your physician recommends that you continue on your current medications as directed. Please refer to the Current Medication list given to you today.  *If you need a refill on your cardiac medications before your next appointment, please call your pharmacy*  Follow-Up: At [facility], you and your health needs are our priority.  As part of our continuing mission to provide you with exceptional heart care, we have created designated Provider Care Teams.  These Care Teams include your primary Cardiologist (physician) and Advanced Practice Providers (APPs -  Physician Assistants and Nurse Practitioners) who all work together to provide you with the care you need, when you need it.  Your next appointment:   6 month(s)  The format for your next appointment:   In Person  Provider:   Gwyndolyn Kaufman, MD  Other Instructions Please check your blood pressure at home 2-3 times per week in the afternoon and call our office at [phone] with a list of your readings in the next week (around December 6th or 7th).  Important Information About Sugar

## 2022-12-08 ENCOUNTER — Other Ambulatory Visit: Payer: Self-pay | Admitting: Interventional Cardiology

## 2022-12-09 ENCOUNTER — Telehealth: Payer: Self-pay | Admitting: Interventional Cardiology

## 2022-12-09 NOTE — Telephone Encounter (Signed)
Patient calling with BP readings:  Sunday Dec. 3rd: 145/76 Mon: 139/56 Tues: 135/70 Wed 118/58 Thurs: 136/74 Fri: 152/74

## 2022-12-16 NOTE — Telephone Encounter (Signed)
Left message for patient with Dr. Thompson Caul comment regarding BP readings:  The blood pressures look reasonable.  Would not change any of her medications currently.     Provided office number for callback if any questions.

## 2022-12-31 ENCOUNTER — Other Ambulatory Visit: Payer: Self-pay | Admitting: Internal Medicine

## 2022-12-31 ENCOUNTER — Telehealth: Payer: Self-pay

## 2022-12-31 ENCOUNTER — Ambulatory Visit
Admission: RE | Admit: 2022-12-31 | Discharge: 2022-12-31 | Disposition: A | Discharge status: Home / Self Care | Payer: Medicare Other | Source: Ambulatory Visit | Attending: Internal Medicine | Admitting: Internal Medicine

## 2022-12-31 DIAGNOSIS — I639 Cerebral infarction, unspecified: Secondary | ICD-10-CM | POA: Diagnosis not present

## 2022-12-31 DIAGNOSIS — I63219 Cerebral infarction due to unspecified occlusion or stenosis of unspecified vertebral arteries: Secondary | ICD-10-CM

## 2022-12-31 NOTE — Telephone Encounter (Signed)
Per Dr Leonie Man, urgent referral for CVA  Schedule in an 8:00 or 4:00 appointment with him next week - January 8- January 11.  LVM for patient asking for call back

## 2023-01-01 DIAGNOSIS — R5383 Other fatigue: Secondary | ICD-10-CM | POA: Diagnosis not present

## 2023-01-01 DIAGNOSIS — I639 Cerebral infarction, unspecified: Secondary | ICD-10-CM | POA: Diagnosis not present

## 2023-01-03 DIAGNOSIS — I69354 Hemiplegia and hemiparesis following cerebral infarction affecting left non-dominant side: Secondary | ICD-10-CM | POA: Diagnosis not present

## 2023-01-03 DIAGNOSIS — H8110 Benign paroxysmal vertigo, unspecified ear: Secondary | ICD-10-CM | POA: Diagnosis not present

## 2023-01-03 DIAGNOSIS — M4800 Spinal stenosis, site unspecified: Secondary | ICD-10-CM | POA: Diagnosis not present

## 2023-01-03 DIAGNOSIS — Z79899 Other long term (current) drug therapy: Secondary | ICD-10-CM | POA: Diagnosis not present

## 2023-01-03 DIAGNOSIS — Z7901 Long term (current) use of anticoagulants: Secondary | ICD-10-CM | POA: Diagnosis not present

## 2023-01-03 DIAGNOSIS — M8580 Other specified disorders of bone density and structure, unspecified site: Secondary | ICD-10-CM | POA: Diagnosis not present

## 2023-01-03 DIAGNOSIS — M810 Age-related osteoporosis without current pathological fracture: Secondary | ICD-10-CM | POA: Diagnosis not present

## 2023-01-03 DIAGNOSIS — E785 Hyperlipidemia, unspecified: Secondary | ICD-10-CM | POA: Diagnosis not present

## 2023-01-03 DIAGNOSIS — F32A Depression, unspecified: Secondary | ICD-10-CM | POA: Diagnosis not present

## 2023-01-03 DIAGNOSIS — I10 Essential (primary) hypertension: Secondary | ICD-10-CM | POA: Diagnosis not present

## 2023-01-03 DIAGNOSIS — L4052 Psoriatic arthritis mutilans: Secondary | ICD-10-CM | POA: Diagnosis not present

## 2023-01-03 DIAGNOSIS — I447 Left bundle-branch block, unspecified: Secondary | ICD-10-CM | POA: Diagnosis not present

## 2023-01-03 DIAGNOSIS — E538 Deficiency of other specified B group vitamins: Secondary | ICD-10-CM | POA: Diagnosis not present

## 2023-01-03 DIAGNOSIS — Z794 Long term (current) use of insulin: Secondary | ICD-10-CM | POA: Diagnosis not present

## 2023-01-03 DIAGNOSIS — Z9181 History of falling: Secondary | ICD-10-CM | POA: Diagnosis not present

## 2023-01-03 DIAGNOSIS — E11319 Type 2 diabetes mellitus with unspecified diabetic retinopathy without macular edema: Secondary | ICD-10-CM | POA: Diagnosis not present

## 2023-01-03 DIAGNOSIS — I2510 Atherosclerotic heart disease of native coronary artery without angina pectoris: Secondary | ICD-10-CM | POA: Diagnosis not present

## 2023-01-03 DIAGNOSIS — G43909 Migraine, unspecified, not intractable, without status migrainosus: Secondary | ICD-10-CM | POA: Diagnosis not present

## 2023-01-03 DIAGNOSIS — E1140 Type 2 diabetes mellitus with diabetic neuropathy, unspecified: Secondary | ICD-10-CM | POA: Diagnosis not present

## 2023-01-06 ENCOUNTER — Ambulatory Visit (INDEPENDENT_AMBULATORY_CARE_PROVIDER_SITE_OTHER): Payer: Medicare Other | Admitting: Neurology

## 2023-01-06 ENCOUNTER — Encounter: Payer: Self-pay | Admitting: Neurology

## 2023-01-06 VITALS — BP 137/70 | HR 76 | Ht 60.0 in | Wt 200.0 lb

## 2023-01-06 DIAGNOSIS — I6381 Other cerebral infarction due to occlusion or stenosis of small artery: Secondary | ICD-10-CM | POA: Diagnosis not present

## 2023-01-06 DIAGNOSIS — G8194 Hemiplegia, unspecified affecting left nondominant side: Secondary | ICD-10-CM | POA: Diagnosis not present

## 2023-01-06 DIAGNOSIS — I482 Chronic atrial fibrillation, unspecified: Secondary | ICD-10-CM | POA: Diagnosis not present

## 2023-01-06 NOTE — Progress Notes (Signed)
[facility] [address] [place]. [place] [address] [phone]       OFFICE CONSULT NOTE  Ms. [name] Date of Birth:  1934-02-23 Medical Record Number:  [id number]   Referring MD: Rolm Baptise  Reason for Referral: Stroke  HPI: [name] is a pleasant 89 year old Caucasian lady seen today for initial office consultation visit for stroke.  History is obtained from her and her son who is accompanying her as well as Dr. Myrle Sheng whom  spoke to me on the phone last week.  I personally reviewed imaging studies in PACS and electronic medical records.  She has past medical history of coronary artery disease s/p CABG in 2006, paroxysmal A-fib on chronic anticoagulation with Pradaxa, rheumatoid arthritis, diabetes, hypertension, prior left cerebellar stroke in 2012 without residual deficits complicated migraine.  Strokelike symptoms on 11/15/2018 with trouble saying words, confusion and slurred speech.  MRI brain negative for acute stroke.  Old left cerebellar infarct was noted on MRI remote age.  Again she had episode of vertigo nausea and head fullness On 12/28/2022 she developed sudden onset of left-sided weakness and numbness and started dropping objects from the left hand had trouble walking due to feeling of heaviness on the left side.  He normally uses a walker to walking.  Trouble holding it.  And was leaning to the left.  He refused to go to the ER to undergo any testing.  Gradually stabilized and improved somewhat over the next few days but not back to baseline.Dr Myrle Sheng consulted me over the phone on the weekend we discussed treatment options.Patient was finally convinced to get a CT scan of the head which was obtained on 12/31/2022 which has been read as not showing any acute abnormalities but my interpretation shows a right basal ganglia hypodensity (on series 2 image 15/31 years as well as some corresponding coronal sequences) compatible with an acute stroke..   Patient has been quite compliant with the Pradaxa which she has been taking regularly and not missed any doses.  She states left-sided numbness is improved though she still feels heavy on the left.  Fine motor skills are diminished in handwriting and ability to hold any fork is improving but not back to baseline.  Her primary care physician has ordered home physical and Occupational Therapy which he is going to begin soon.  She has been using her walker no falls or injuries.  She is tolerating Pradaxa well without bleeding or bruising.  Her blood pressure is well-controlled today it is 137/70.  She had some lab work done by primary care physician but I do not have access to it and did not know if lipid profile or A1c was checked.  She has followed with Dr. Pernell Dupre.  For outpatient neurology in the past   ROS:   14 system review of systems is positive for weakness, numbness, decreased coordination gait difficulty, imbalance and all other systems negative.  PMH:  Past Medical History:  Diagnosis Date   Anginal pain (HCC)    "saw doctor-think it was esophagus spasm"   Arthritis    Atrial flutter (HCC) 11/02/2013   Cataract    left eye   Coronary artery disease    Diabetes mellitus without complication (HCC)    type 2   Essential hypertension, benign 11/02/2013   H/O jaundice    as child   Headache 2015   complicated migraine x2    Hearing trouble    Heart disease    Hepatitis  hx of hepatitis A as child   High cholesterol    under control   Long term (current) use of anticoagulants 11/02/2013   Pradaxa    Measles    as child   Mouth dryness    Mumps    as teenager   Numbness and tingling    lips   Osteoporosis    "unsure-maybe in knees"   Psoriasis    Psoriatic arthritis (HCC)    Stroke (HCC) 2012   Vertigo    being monitored, occasional    Social History:  Social History   Socioeconomic History   Marital status: Married    Spouse name: [name]   Number of children: 3    Years of education: 16   Highest education level: Bachelor's degree (e.g., BA, AB, BS)  Occupational History   Occupation: Retired  Tobacco Use   Smoking status: Never   Smokeless tobacco: Never  Vaping Use   Vaping Use: Never used  Substance and Sexual Activity   Alcohol use: No    Alcohol/week: 0.0 standard drinks of alcohol   Drug use: No   Sexual activity: Not on file  Other Topics Concern   Not on file  Social History Narrative   Patient is married with 3 children.   Patient is left handed.   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   Patient drinks 3 cups daily.   Social Determinants of Health   Financial Resource Strain: Not on file  Food Insecurity: Not on file  Transportation Needs: Not on file  Physical Activity: Not on file  Stress: Not on file  Social Connections: Not on file  Intimate Partner Violence: Not on file    Medications:   Current Outpatient Medications on File Prior to Visit  Medication Sig Dispense Refill   Accu-Chek FastClix Lancets MISC .     acetaminophen (TYLENOL) 650 MG CR tablet Take 650 mg by mouth as needed.     B-D UF III MINI PEN NEEDLES 31G X 5 MM MISC SMARTSIG:Pre-Filled Pen Syringe SUB-Q As Directed     calcium elemental as carbonate (BARIATRIC TUMS ULTRA) 400 MG chewable tablet Chew 1,000 mg by mouth every evening.     carvedilol (COREG) 6.25 MG tablet Take 1 tablet (6.25 mg total) by mouth 2 (two) times daily. 180 tablet 3   cholecalciferol (VITAMIN D) 1000 units tablet Take 2,000 Units by mouth daily.     clobetasol cream (TEMOVATE) 0.05 % Apply 1 application topically 2 (two) times daily as needed (skin irritation).     clonazePAM (KLONOPIN) 0.5 MG tablet Take 0.5 mg by mouth at bedtime. Pt takes 0.5mg  at bedtime and then a second dose .25mg  tablet if she wakes up in the middle of the night .     Continuous Blood Gluc Sensor (FREESTYLE LIBRE 2 SENSOR) MISC as directed.     docusate sodium (COLACE) 100 MG capsule as needed.      ENBREL 50 MG/ML injection Inject 50 mg into the skin once a week.      glucose blood (ACCU-CHEK GUIDE) test strip USE AS DIRECTED THREE TIMES DAILY TO  CHECK  BLOOD  GLUCOSE     HUMALOG MIX 50/50 KWIKPEN (50-50) 100 UNIT/ML Kwikpen Inject 35 Units into the skin 2 (two) times daily. 56 Units in AM; 10 Units in the afternoon.     Insulin Pen Needle (B-D UF III MINI PEN NEEDLES) 31G X 5 MM MISC See admin instructions.  isosorbide mononitrate (IMDUR) 30 MG 24 hr tablet Take 0.5 tablets (15 mg total) by mouth daily. 45 tablet 3   loratadine (CLARITIN) 10 MG tablet daily at 6 (six) AM.     nitroGLYCERIN (NITROSTAT) 0.4 MG SL tablet Place 1 tablet (0.4 mg total) under the tongue every 5 (five) minutes x 3 doses as needed for chest pain. 25 tablet 3   ondansetron (ZOFRAN) 4 MG tablet Take 1 tablet by mouth as needed.     polyethylene glycol (MIRALAX / GLYCOLAX) 17 g packet Take 17 g by mouth daily.     PRADAXA 150 MG CAPS Take 150 mg by mouth 2 (two) times daily.      Probiotic Product (ALIGN PO) Take 1 capsule by mouth daily.     ramipril (ALTACE) 5 MG capsule TAKE ONE CAPSULE BY MOUTH TWICE DAILY 60 capsule 11   rosuvastatin (CRESTOR) 10 MG tablet Take 10 mg by mouth at bedtime.      spironolactone (ALDACTONE) 25 MG tablet TAKE 1/2 TABLET BY MOUTH DAILY 45 tablet 3   vitamin B-12 (CYANOCOBALAMIN) 250 MCG tablet Take 250 mcg by mouth daily.     No current facility-administered medications on file prior to visit.    Allergies:   Allergies  Allergen Reactions   Codeine Nausea And Vomiting   Penicillins Swelling and Other (See Comments)    Did it involve swelling of the face/tongue/throat, SOB, or low BP? Yes Did it involve sudden or severe rash/hives, skin peeling, or any reaction on the inside of your mouth or nose? Unknown Did you need to seek medical attention at a hospital or doctor's office? Unknown When did it last happen?  Unknown If all above answers are "NO", may proceed with  cephalosporin use.  Swelling of tongue   Acetaminophen-Codeine     Other reaction(s): Up set stomach   Atenolol Other (See Comments)   Atorvastatin     Muscle soreness   Chloramphenicol Other (See Comments)   Dapagliflozin     Other reaction(s): yeast infections   Empagliflozin     Other reaction(s): yeast infections   Liraglutide     Other reaction(s): constipation, bloating, nausea   Metformin Hcl Other (See Comments)   Metoprolol Other (See Comments)   Zetia [Ezetimibe] Other (See Comments)   Sertraline Palpitations    Physical Exam General: well developed, well nourished pleasant elderly Caucasian lady, seated, in no evident distress Head: head normocephalic and atraumatic.   Neck: supple with no carotid or supraclavicular bruits Cardiovascular: regular rate and rhythm, no murmurs Musculoskeletal: no deformity Skin:  no rash/petichiae Vascular:  Normal pulses all extremities  Neurologic Exam Mental Status: Awake and fully alert. Oriented to place and time. Recent and remote memory intact. Attention span, concentration and fund of knowledge appropriate. Mood and affect appropriate.  Diminished recall 2/3.  Able to name 11 animals that can walk on 4 legs.  Clock drawing 4/4. Cranial Nerves: Fundoscopic exam reveals sharp disc margins. Pupils equal, briskly reactive to light. Extraocular movements full without nystagmus. Visual fields full to confrontation. Hearing intact. Facial sensation intact. Face, tongue, palate moves normally and symmetrically.  Motor: Normal bulk and tone. Normal strength in all tested extremity muscles except slight diminished fine motor skills in the left hand.  Orbits right over left upper extremity.  Slightly increased tone in the left upper and lower extremity.. Sensory.: intact to touch , pinprick , position and vibratory sensation.  Coordination: Rapid alternating movements normal in all extremities. Finger-to-nose  and heel-to-shin performed  accurately bilaterally. Gait and Station: Arises from chair with slight difficulty. Stance is stooped.  Uses a walker and walks with cautious gait with slight dragging of the left leg.  Slightly unsteady while standing on left foot unsupported Reflexes: 1+ and symmetric. Toes downgoing.   NIHSS  0 Modified Rankin  3   ASSESSMENT: 89 year old Caucasian lady with left hemiparesis and numbness in December 2023 due to right subcortical infarct likely i from small vessel disease.  She has chronic atrial fibrillation and is on long-term anticoagulation with Pradaxa and has past history of left cerebellar stroke in 2012 and possible TIA in 2019.  Vascular risk factors of diabetes, hypertension, hyperlipidemia , atrial fibrillation, mild obesity     PLAN:I had a long d/w patient and her son about her recent stroke, chronic atrial fibrillation,risk for recurrent stroke/TIAs, personally independently reviewed imaging studies and stroke evaluation results and answered questions.Continue Pradaxa (dabigatran) twice a day  for secondary stroke prevention there is no definite evidence that switching to alternative anticoagulant regimen necessarily superior.  And maintain strict control of hypertension with blood pressure goal below 130/90, diabetes with hemoglobin A1c goal below 6.5% and lipids with LDL cholesterol goal below 70 mg/dL. I also advised the patient to eat a healthy diet with plenty of whole grains, cereals, fruits and vegetables, exercise regularly and maintain ideal body weight .check MRI scan of the brain, MR angiogram of the brain and carotid ultrasound.  Continue home physical and Occupational Therapy.  May need to repeat lipid profile and hemoglobin A1c unless there were recently checked by primary care physician.  She was advised to use a walker at all times and we discussed fall safety precautions.  Followup in the future with me in 3 months or call earlier if necessary.  Greater than 50% time  during this 45-minute consultation was obtained on counseling and coordination of care about her lacunar stroke and discussion about evaluation and  Delia Heady, MD Note: This document was prepared with digital dictation and possible smart phrase technology. Any transcriptional errors that result from this process are unintentional.

## 2023-01-06 NOTE — Patient Instructions (Signed)
I had a long d/w patient and her son about her recent stroke, chronic atrial fibrillation,risk for recurrent stroke/TIAs, personally independently reviewed imaging studies and stroke evaluation results and answered questions.Continue Pradaxa (dabigatran) twice a day  for secondary stroke prevention and maintain strict control of hypertension with blood pressure goal below 130/90, diabetes with hemoglobin A1c goal below 6.5% and lipids with LDL cholesterol goal below 70 mg/dL. I also advised the patient to eat a healthy diet with plenty of whole grains, cereals, fruits and vegetables, exercise regularly and maintain ideal body weight .check MRI scan of the brain, MR angiogram of the brain and carotid ultrasound.  Continue home physical and Occupational Therapy.  She was advised to use a walker at all times and we discussed fall safety precautions.  Followup in the future with me in 3 months or call earlier if necessary.  Fall Prevention in the Home, Adult Falls can cause injuries and affect people of all ages. There are many simple things that you can do to make your home safe and to help prevent falls. Ask for help when making these changes, if needed. What actions can I take to prevent falls? General instructions Use good lighting in all rooms. Replace any light bulbs that burn out, turn on lights if it is dark, and use night-lights. Place frequently used items in easy-to-reach places. Lower the shelves around your home if necessary. Set up furniture so that there are clear paths around it. Avoid moving your furniture around. Remove throw rugs and other tripping hazards from the floor. Avoid walking on wet floors. Fix any uneven floor surfaces. Add color or contrast paint or tape to grab bars and handrails in your home. Place contrasting color strips on the first and last steps of staircases. When you use a stepladder, make sure that it is completely opened and that the sides and supports are firmly  locked. Have someone hold the ladder while you are using it. Do not climb a closed stepladder. Know where your pets are when moving through your home. What can I do in the bathroom?     Keep the floor dry. Immediately clean up any water that is on the floor. Remove soap buildup in the tub or shower regularly. Use nonskid mats or decals on the floor of the tub or shower. Attach bath mats securely with double-sided, nonslip rug tape. If you need to sit down while you are in the shower, use a plastic, nonslip stool. Install grab bars by the toilet and in the tub and shower. Do not use towel bars as grab bars. What can I do in the bedroom? Make sure that a bedside light is easy to reach. Do not use oversized bedding that reaches the floor. Have a firm chair that has side arms to use for getting dressed. What can I do in the kitchen? Clean up any spills right away. If you need to reach for something above you, use a sturdy step stool that has a grab bar. Keep electrical cables out of the way. Do not use floor polish or wax that makes floors slippery. If you must use wax, make sure that it is non-skid floor wax. What can I do with my stairs? Do not leave any items on the stairs. Make sure that you have a light switch at the top and the bottom of the stairs. Have them installed if you do not have them. Make sure that there are handrails on both sides of the stairs.  Fix handrails that are broken or loose. Make sure that handrails are as long as the staircases. Install non-slip stair treads on all stairs in your home. Avoid having throw rugs at the top or bottom of stairs, or secure the rugs with carpet tape to prevent them from moving. Choose a carpet design that does not hide the edge of steps on the stairs. Check any carpeting to make sure that it is firmly attached to the stairs. Fix any carpet that is loose or worn. What can I do on the outside of my home? Use bright outdoor  lighting. Regularly repair the edges of walkways and driveways and fix any cracks. Remove high doorway thresholds. Trim any shrubbery on the main path into your home. Regularly check that handrails are securely fastened and in good repair. Both sides of all steps should have handrails. Install guardrails along the edges of any raised decks or porches. Clear walkways of debris and clutter, including tools and rocks. Have leaves, snow, and ice cleared regularly. Use sand or salt on walkways during winter months. In the garage, clean up any spills right away, including grease or oil spills. What other actions can I take? Wear closed-toe shoes that fit well and support your feet. Wear shoes that have rubber soles or low heels. Use mobility aids as needed, such as canes, walkers, scooters, and crutches. Review your medicines with your health care provider. Some medicines can cause dizziness or changes in blood pressure, which increase your risk of falling. Talk with your health care provider about other ways that you can decrease your risk of falls. This may include working with a physical therapist or trainer to improve your strength, balance, and endurance. Where to find more information Centers for Disease Control and Prevention, [employer]: http://www.wolf.info/ National Institute on Aging: http://kim-miller.com/ Contact a health care provider if: You are afraid of falling at home. You feel weak, drowsy, or dizzy at home. You fall at home. Summary There are many simple things that you can do to make your home safe and to help prevent falls. Ways to make your home safe include removing tripping hazards and installing grab bars in the bathroom. Ask for help when making these changes in your home. This information is not intended to replace advice given to you by your health care provider. Make sure you discuss any questions you have with your health care provider. Document Revised: 09/17/2021 Document Reviewed:  07/19/2020 Elsevier Patient Education  [place].  Stroke Prevention Some medical conditions and behaviors can lead to a higher chance of having a stroke. You can help prevent a stroke by eating healthy, exercising, not smoking, and managing any medical conditions you have. Stroke is a leading cause of functional impairment. Primary prevention is particularly important because a majority of strokes are first-time events. Stroke changes the lives of not only those who experience a stroke but also their family and other caregivers. How can this condition affect me? A stroke is a medical emergency and should be treated right away. A stroke can lead to brain damage and can sometimes be life-threatening. If a person gets medical treatment right away, there is a better chance of surviving and recovering from a stroke. What can increase my risk? The following medical conditions may increase your risk of a stroke: Cardiovascular disease. High blood pressure (hypertension). Diabetes. High cholesterol. Sickle cell disease. Blood clotting disorders (hypercoagulable state). Obesity. Sleep disorders (obstructive sleep apnea). Other risk factors include: Being older than age  56. Having a history of blood clots, stroke, or mini-stroke (transient ischemic attack, TIA). Genetic factors, such as race, ethnicity, or a family history of stroke. Smoking cigarettes or using other tobacco products. Taking birth control pills, especially if you also use tobacco. Heavy use of alcohol or drugs, especially cocaine and methamphetamine. Physical inactivity. What actions can I take to prevent this? Manage your health conditions High cholesterol levels. Eating a healthy diet is important for preventing high cholesterol. If cholesterol cannot be managed through diet alone, you may need to take medicines. Take any prescribed medicines to control your cholesterol as told by your health care  provider. Hypertension. To reduce your risk of stroke, try to keep your blood pressure below 130/80. Eating a healthy diet and exercising regularly are important for controlling blood pressure. If these steps are not enough to manage your blood pressure, you may need to take medicines. Take any prescribed medicines to control hypertension as told by your health care provider. Ask your health care provider if you should monitor your blood pressure at home. Have your blood pressure checked every year, even if your blood pressure is normal. Blood pressure increases with age and some medical conditions. Diabetes. Eating a healthy diet and exercising regularly are important parts of managing your blood sugar (glucose). If your blood sugar cannot be managed through diet and exercise, you may need to take medicines. Take any prescribed medicines to control your diabetes as told by your health care provider. Get evaluated for obstructive sleep apnea. Talk to your health care provider about getting a sleep evaluation if you snore a lot or have excessive sleepiness. Make sure that any other medical conditions you have, such as atrial fibrillation or atherosclerosis, are managed. Nutrition Follow instructions from your health care provider about what to eat or drink to help manage your health condition. These instructions may include: Reducing your daily calorie intake. Limiting how much salt (sodium) you use to 1,500 milligrams (mg) each day. Using only healthy fats for cooking, such as olive oil, canola oil, or sunflower oil. Eating healthy foods. You can do this by: Choosing foods that are high in fiber, such as whole grains, and fresh fruits and vegetables. Eating at least 5 servings of fruits and vegetables a day. Try to fill one-half of your plate with fruits and vegetables at each meal. Choosing lean protein foods, such as lean cuts of meat, poultry without skin, fish, tofu, beans, and nuts. Eating  low-fat dairy products. Avoiding foods that are high in sodium. This can help lower blood pressure. Avoiding foods that have saturated fat, trans fat, and cholesterol. This can help prevent high cholesterol. Avoiding processed and prepared foods. Counting your daily carbohydrate intake.  Lifestyle If you drink alcohol: Limit how much you have to: 0-1 drink a day for women who are not pregnant. 0-2 drinks a day for men. Know how much alcohol is in your drink. In the [place] one drink equals one 12 oz bottle of beer (329m), one 5 oz glass of wine (1475m, or one 1 oz glass of hard liquor (4474m Do not use any products that contain nicotine or tobacco. These products include cigarettes, chewing tobacco, and vaping devices, such as e-cigarettes. If you need help quitting, ask your health care provider. Avoid secondhand smoke. Do not use drugs. Activity  Try to stay at a healthy weight. Get at least 30 minutes of exercise on most days, such as: Fast walking. Biking. Swimming. Medicines Take over-the-counter and prescription  medicines only as told by your health care provider. Aspirin or blood thinners (antiplatelets or anticoagulants) may be recommended to reduce your risk of forming blood clots that can lead to stroke. Avoid taking birth control pills. Talk to your health care provider about the risks of taking birth control pills if: You are over 51 years old. You smoke. You get very bad headaches. You have had a blood clot. Where to find more information American Stroke Association: www.strokeassociation.org Get help right away if: You or a loved one has any symptoms of a stroke. "BE FAST" is an easy way to remember the main warning signs of a stroke: B - Balance. Signs are dizziness, sudden trouble walking, or loss of balance. E - Eyes. Signs are trouble seeing or a sudden change in vision. F - Face. Signs are sudden weakness or numbness of the face, or the face or eyelid drooping  on one side. A - Arms. Signs are weakness or numbness in an arm. This happens suddenly and usually on one side of the body. S - Speech. Signs are sudden trouble speaking, slurred speech, or trouble understanding what people say. T - Time. Time to call emergency services. Write down what time symptoms started. You or a loved one has other signs of a stroke, such as: A sudden, severe headache with no known cause. Nausea or vomiting. Seizure. These symptoms may represent a serious problem that is an emergency. Do not wait to see if the symptoms will go away. Get medical help right away. Call your local emergency services (911 in the [place] Do not drive yourself to the hospital. Summary You can help to prevent a stroke by eating healthy, exercising, not smoking, limiting alcohol intake, and managing any medical conditions you may have. Do not use any products that contain nicotine or tobacco. These include cigarettes, chewing tobacco, and vaping devices, such as e-cigarettes. If you need help quitting, ask your health care provider. Remember "BE FAST" for warning signs of a stroke. Get help right away if you or a loved one has any of these signs. This information is not intended to replace advice given to you by your health care provider. Make sure you discuss any questions you have with your health care provider. Document Revised: 07/17/2020 Document Reviewed: 07/17/2020 Elsevier Patient Education  [place].

## 2023-01-07 ENCOUNTER — Telehealth: Payer: Self-pay | Admitting: Neurology

## 2023-01-07 NOTE — Telephone Encounter (Signed)
Phone number to schedule the carotid ultrasound is [phone]

## 2023-01-07 NOTE — Telephone Encounter (Signed)
medicare/[employer] sup [employer] sent to [place]

## 2023-01-08 ENCOUNTER — Other Ambulatory Visit: Payer: Self-pay

## 2023-01-09 ENCOUNTER — Encounter: Payer: Self-pay | Admitting: Neurology

## 2023-01-09 DIAGNOSIS — E785 Hyperlipidemia, unspecified: Secondary | ICD-10-CM | POA: Diagnosis not present

## 2023-01-09 DIAGNOSIS — Z794 Long term (current) use of insulin: Secondary | ICD-10-CM | POA: Diagnosis not present

## 2023-01-09 DIAGNOSIS — E1140 Type 2 diabetes mellitus with diabetic neuropathy, unspecified: Secondary | ICD-10-CM | POA: Diagnosis not present

## 2023-01-09 DIAGNOSIS — I2510 Atherosclerotic heart disease of native coronary artery without angina pectoris: Secondary | ICD-10-CM | POA: Diagnosis not present

## 2023-01-09 DIAGNOSIS — I69354 Hemiplegia and hemiparesis following cerebral infarction affecting left non-dominant side: Secondary | ICD-10-CM | POA: Diagnosis not present

## 2023-01-09 DIAGNOSIS — I10 Essential (primary) hypertension: Secondary | ICD-10-CM | POA: Diagnosis not present

## 2023-01-10 DIAGNOSIS — E1140 Type 2 diabetes mellitus with diabetic neuropathy, unspecified: Secondary | ICD-10-CM | POA: Diagnosis not present

## 2023-01-10 DIAGNOSIS — I69354 Hemiplegia and hemiparesis following cerebral infarction affecting left non-dominant side: Secondary | ICD-10-CM | POA: Diagnosis not present

## 2023-01-10 DIAGNOSIS — I2510 Atherosclerotic heart disease of native coronary artery without angina pectoris: Secondary | ICD-10-CM | POA: Diagnosis not present

## 2023-01-10 DIAGNOSIS — Z794 Long term (current) use of insulin: Secondary | ICD-10-CM | POA: Diagnosis not present

## 2023-01-10 DIAGNOSIS — I10 Essential (primary) hypertension: Secondary | ICD-10-CM | POA: Diagnosis not present

## 2023-01-10 DIAGNOSIS — E785 Hyperlipidemia, unspecified: Secondary | ICD-10-CM | POA: Diagnosis not present

## 2023-01-14 DIAGNOSIS — E1140 Type 2 diabetes mellitus with diabetic neuropathy, unspecified: Secondary | ICD-10-CM | POA: Diagnosis not present

## 2023-01-14 DIAGNOSIS — E785 Hyperlipidemia, unspecified: Secondary | ICD-10-CM | POA: Diagnosis not present

## 2023-01-14 DIAGNOSIS — I69354 Hemiplegia and hemiparesis following cerebral infarction affecting left non-dominant side: Secondary | ICD-10-CM | POA: Diagnosis not present

## 2023-01-14 DIAGNOSIS — I10 Essential (primary) hypertension: Secondary | ICD-10-CM | POA: Diagnosis not present

## 2023-01-14 DIAGNOSIS — Z794 Long term (current) use of insulin: Secondary | ICD-10-CM | POA: Diagnosis not present

## 2023-01-14 DIAGNOSIS — I2510 Atherosclerotic heart disease of native coronary artery without angina pectoris: Secondary | ICD-10-CM | POA: Diagnosis not present

## 2023-01-22 ENCOUNTER — Ambulatory Visit (HOSPITAL_COMMUNITY): Payer: Medicare Other

## 2023-01-22 ENCOUNTER — Ambulatory Visit (HOSPITAL_COMMUNITY)
Admission: RE | Admit: 2023-01-22 | Discharge: 2023-01-22 | Disposition: A | Discharge status: Home / Self Care | Payer: Medicare Other | Source: Ambulatory Visit | Attending: Neurology | Admitting: Neurology

## 2023-01-22 DIAGNOSIS — I6381 Other cerebral infarction due to occlusion or stenosis of small artery: Secondary | ICD-10-CM | POA: Insufficient documentation

## 2023-01-22 DIAGNOSIS — G8194 Hemiplegia, unspecified affecting left nondominant side: Secondary | ICD-10-CM | POA: Insufficient documentation

## 2023-01-23 DIAGNOSIS — E785 Hyperlipidemia, unspecified: Secondary | ICD-10-CM | POA: Diagnosis not present

## 2023-01-23 DIAGNOSIS — E1140 Type 2 diabetes mellitus with diabetic neuropathy, unspecified: Secondary | ICD-10-CM | POA: Diagnosis not present

## 2023-01-23 DIAGNOSIS — I69354 Hemiplegia and hemiparesis following cerebral infarction affecting left non-dominant side: Secondary | ICD-10-CM | POA: Diagnosis not present

## 2023-01-23 DIAGNOSIS — I10 Essential (primary) hypertension: Secondary | ICD-10-CM | POA: Diagnosis not present

## 2023-01-23 DIAGNOSIS — Z794 Long term (current) use of insulin: Secondary | ICD-10-CM | POA: Diagnosis not present

## 2023-01-23 DIAGNOSIS — I2510 Atherosclerotic heart disease of native coronary artery without angina pectoris: Secondary | ICD-10-CM | POA: Diagnosis not present

## 2023-01-24 DIAGNOSIS — I69354 Hemiplegia and hemiparesis following cerebral infarction affecting left non-dominant side: Secondary | ICD-10-CM | POA: Diagnosis not present

## 2023-01-24 DIAGNOSIS — E785 Hyperlipidemia, unspecified: Secondary | ICD-10-CM | POA: Diagnosis not present

## 2023-01-24 DIAGNOSIS — I2510 Atherosclerotic heart disease of native coronary artery without angina pectoris: Secondary | ICD-10-CM | POA: Diagnosis not present

## 2023-01-24 DIAGNOSIS — I10 Essential (primary) hypertension: Secondary | ICD-10-CM | POA: Diagnosis not present

## 2023-01-24 DIAGNOSIS — Z794 Long term (current) use of insulin: Secondary | ICD-10-CM | POA: Diagnosis not present

## 2023-01-24 DIAGNOSIS — E1140 Type 2 diabetes mellitus with diabetic neuropathy, unspecified: Secondary | ICD-10-CM | POA: Diagnosis not present

## 2023-01-27 ENCOUNTER — Ambulatory Visit
Admission: RE | Admit: 2023-01-27 | Discharge: 2023-01-27 | Disposition: A | Discharge status: Home / Self Care | Payer: Medicare Other | Source: Ambulatory Visit | Attending: Neurology | Admitting: Neurology

## 2023-01-27 DIAGNOSIS — I6381 Other cerebral infarction due to occlusion or stenosis of small artery: Secondary | ICD-10-CM | POA: Diagnosis not present

## 2023-01-27 MED ORDER — GADOPICLENOL 0.5 MMOL/ML IV SOLN
9.0000 mL | Freq: Once | INTRAVENOUS | Status: AC | PRN
  Administered 2023-01-27: 9 mL via INTRAVENOUS

## 2023-01-28 DIAGNOSIS — I480 Paroxysmal atrial fibrillation: Secondary | ICD-10-CM | POA: Diagnosis not present

## 2023-01-28 DIAGNOSIS — L4050 Arthropathic psoriasis, unspecified: Secondary | ICD-10-CM | POA: Diagnosis not present

## 2023-01-28 DIAGNOSIS — I519 Heart disease, unspecified: Secondary | ICD-10-CM | POA: Diagnosis not present

## 2023-01-28 DIAGNOSIS — E7800 Pure hypercholesterolemia, unspecified: Secondary | ICD-10-CM | POA: Diagnosis not present

## 2023-01-28 DIAGNOSIS — I10 Essential (primary) hypertension: Secondary | ICD-10-CM | POA: Diagnosis not present

## 2023-01-28 DIAGNOSIS — I639 Cerebral infarction, unspecified: Secondary | ICD-10-CM | POA: Diagnosis not present

## 2023-01-28 DIAGNOSIS — Z794 Long term (current) use of insulin: Secondary | ICD-10-CM | POA: Diagnosis not present

## 2023-01-28 DIAGNOSIS — Z23 Encounter for immunization: Secondary | ICD-10-CM | POA: Diagnosis not present

## 2023-01-28 DIAGNOSIS — D6869 Other thrombophilia: Secondary | ICD-10-CM | POA: Diagnosis not present

## 2023-01-28 DIAGNOSIS — E11319 Type 2 diabetes mellitus with unspecified diabetic retinopathy without macular edema: Secondary | ICD-10-CM | POA: Diagnosis not present

## 2023-01-28 DIAGNOSIS — Z0000 Encounter for general adult medical examination without abnormal findings: Secondary | ICD-10-CM | POA: Diagnosis not present

## 2023-01-29 DIAGNOSIS — E785 Hyperlipidemia, unspecified: Secondary | ICD-10-CM | POA: Diagnosis not present

## 2023-01-29 DIAGNOSIS — I69354 Hemiplegia and hemiparesis following cerebral infarction affecting left non-dominant side: Secondary | ICD-10-CM | POA: Diagnosis not present

## 2023-01-29 DIAGNOSIS — I10 Essential (primary) hypertension: Secondary | ICD-10-CM | POA: Diagnosis not present

## 2023-01-29 DIAGNOSIS — I2510 Atherosclerotic heart disease of native coronary artery without angina pectoris: Secondary | ICD-10-CM | POA: Diagnosis not present

## 2023-01-29 DIAGNOSIS — Z794 Long term (current) use of insulin: Secondary | ICD-10-CM | POA: Diagnosis not present

## 2023-01-29 DIAGNOSIS — E1140 Type 2 diabetes mellitus with diabetic neuropathy, unspecified: Secondary | ICD-10-CM | POA: Diagnosis not present

## 2023-01-30 ENCOUNTER — Telehealth: Payer: Self-pay

## 2023-01-30 DIAGNOSIS — Z794 Long term (current) use of insulin: Secondary | ICD-10-CM | POA: Diagnosis not present

## 2023-01-30 DIAGNOSIS — I69354 Hemiplegia and hemiparesis following cerebral infarction affecting left non-dominant side: Secondary | ICD-10-CM | POA: Diagnosis not present

## 2023-01-30 DIAGNOSIS — E1140 Type 2 diabetes mellitus with diabetic neuropathy, unspecified: Secondary | ICD-10-CM | POA: Diagnosis not present

## 2023-01-30 DIAGNOSIS — E785 Hyperlipidemia, unspecified: Secondary | ICD-10-CM | POA: Diagnosis not present

## 2023-01-30 DIAGNOSIS — I10 Essential (primary) hypertension: Secondary | ICD-10-CM | POA: Diagnosis not present

## 2023-01-30 DIAGNOSIS — I2510 Atherosclerotic heart disease of native coronary artery without angina pectoris: Secondary | ICD-10-CM | POA: Diagnosis not present

## 2023-01-30 NOTE — Telephone Encounter (Signed)
-----  Message from Garvin Fila, MD sent at 01/30/2023  1:52 PM EST ----- Kindly inform the patient that MRI scan of the brain shows a couple of old strokes in the deep portion of the brain in the back of the brain.  No definite new stroke or any worrisome findings.  Age-related findings of wear and tear in the brain.  MR angiogram study of the brain blood vessels does not show any major blockages in the brain to worry about

## 2023-01-30 NOTE — Progress Notes (Signed)
Kindly inform the patient that MRI scan of the brain shows a couple of old strokes in the deep portion of the brain in the back of the brain.  No definite new stroke or any worrisome findings.  Age-related findings of wear and tear in the brain.  MR angiogram study of the brain blood vessels does not show any major blockages in the brain to worry about

## 2023-02-02 DIAGNOSIS — E11319 Type 2 diabetes mellitus with unspecified diabetic retinopathy without macular edema: Secondary | ICD-10-CM | POA: Diagnosis not present

## 2023-02-02 DIAGNOSIS — L4052 Psoriatic arthritis mutilans: Secondary | ICD-10-CM | POA: Diagnosis not present

## 2023-02-02 DIAGNOSIS — I447 Left bundle-branch block, unspecified: Secondary | ICD-10-CM | POA: Diagnosis not present

## 2023-02-02 DIAGNOSIS — F32A Depression, unspecified: Secondary | ICD-10-CM | POA: Diagnosis not present

## 2023-02-02 DIAGNOSIS — M4800 Spinal stenosis, site unspecified: Secondary | ICD-10-CM | POA: Diagnosis not present

## 2023-02-02 DIAGNOSIS — Z794 Long term (current) use of insulin: Secondary | ICD-10-CM | POA: Diagnosis not present

## 2023-02-02 DIAGNOSIS — E538 Deficiency of other specified B group vitamins: Secondary | ICD-10-CM | POA: Diagnosis not present

## 2023-02-02 DIAGNOSIS — Z9181 History of falling: Secondary | ICD-10-CM | POA: Diagnosis not present

## 2023-02-02 DIAGNOSIS — E785 Hyperlipidemia, unspecified: Secondary | ICD-10-CM | POA: Diagnosis not present

## 2023-02-02 DIAGNOSIS — E1140 Type 2 diabetes mellitus with diabetic neuropathy, unspecified: Secondary | ICD-10-CM | POA: Diagnosis not present

## 2023-02-02 DIAGNOSIS — I2510 Atherosclerotic heart disease of native coronary artery without angina pectoris: Secondary | ICD-10-CM | POA: Diagnosis not present

## 2023-02-02 DIAGNOSIS — M8580 Other specified disorders of bone density and structure, unspecified site: Secondary | ICD-10-CM | POA: Diagnosis not present

## 2023-02-02 DIAGNOSIS — I69354 Hemiplegia and hemiparesis following cerebral infarction affecting left non-dominant side: Secondary | ICD-10-CM | POA: Diagnosis not present

## 2023-02-02 DIAGNOSIS — Z79899 Other long term (current) drug therapy: Secondary | ICD-10-CM | POA: Diagnosis not present

## 2023-02-02 DIAGNOSIS — I10 Essential (primary) hypertension: Secondary | ICD-10-CM | POA: Diagnosis not present

## 2023-02-02 DIAGNOSIS — Z7901 Long term (current) use of anticoagulants: Secondary | ICD-10-CM | POA: Diagnosis not present

## 2023-02-02 DIAGNOSIS — M810 Age-related osteoporosis without current pathological fracture: Secondary | ICD-10-CM | POA: Diagnosis not present

## 2023-02-02 DIAGNOSIS — G43909 Migraine, unspecified, not intractable, without status migrainosus: Secondary | ICD-10-CM | POA: Diagnosis not present

## 2023-02-02 DIAGNOSIS — H8110 Benign paroxysmal vertigo, unspecified ear: Secondary | ICD-10-CM | POA: Diagnosis not present

## 2023-02-03 DIAGNOSIS — I10 Essential (primary) hypertension: Secondary | ICD-10-CM | POA: Diagnosis not present

## 2023-02-03 DIAGNOSIS — K219 Gastro-esophageal reflux disease without esophagitis: Secondary | ICD-10-CM | POA: Diagnosis not present

## 2023-02-03 DIAGNOSIS — I480 Paroxysmal atrial fibrillation: Secondary | ICD-10-CM | POA: Diagnosis not present

## 2023-02-03 DIAGNOSIS — E7800 Pure hypercholesterolemia, unspecified: Secondary | ICD-10-CM | POA: Diagnosis not present

## 2023-02-03 DIAGNOSIS — E11319 Type 2 diabetes mellitus with unspecified diabetic retinopathy without macular edema: Secondary | ICD-10-CM | POA: Diagnosis not present

## 2023-02-04 DIAGNOSIS — I2510 Atherosclerotic heart disease of native coronary artery without angina pectoris: Secondary | ICD-10-CM | POA: Diagnosis not present

## 2023-02-04 DIAGNOSIS — E1140 Type 2 diabetes mellitus with diabetic neuropathy, unspecified: Secondary | ICD-10-CM | POA: Diagnosis not present

## 2023-02-04 DIAGNOSIS — I10 Essential (primary) hypertension: Secondary | ICD-10-CM | POA: Diagnosis not present

## 2023-02-04 DIAGNOSIS — Z794 Long term (current) use of insulin: Secondary | ICD-10-CM | POA: Diagnosis not present

## 2023-02-04 DIAGNOSIS — E785 Hyperlipidemia, unspecified: Secondary | ICD-10-CM | POA: Diagnosis not present

## 2023-02-04 DIAGNOSIS — I69354 Hemiplegia and hemiparesis following cerebral infarction affecting left non-dominant side: Secondary | ICD-10-CM | POA: Diagnosis not present

## 2023-02-05 DIAGNOSIS — E1140 Type 2 diabetes mellitus with diabetic neuropathy, unspecified: Secondary | ICD-10-CM | POA: Diagnosis not present

## 2023-02-05 DIAGNOSIS — I69354 Hemiplegia and hemiparesis following cerebral infarction affecting left non-dominant side: Secondary | ICD-10-CM | POA: Diagnosis not present

## 2023-02-05 DIAGNOSIS — E785 Hyperlipidemia, unspecified: Secondary | ICD-10-CM | POA: Diagnosis not present

## 2023-02-05 DIAGNOSIS — Z794 Long term (current) use of insulin: Secondary | ICD-10-CM | POA: Diagnosis not present

## 2023-02-05 DIAGNOSIS — I2510 Atherosclerotic heart disease of native coronary artery without angina pectoris: Secondary | ICD-10-CM | POA: Diagnosis not present

## 2023-02-05 DIAGNOSIS — I10 Essential (primary) hypertension: Secondary | ICD-10-CM | POA: Diagnosis not present

## 2023-02-06 ENCOUNTER — Other Ambulatory Visit: Payer: Self-pay | Admitting: Internal Medicine

## 2023-02-12 DIAGNOSIS — I10 Essential (primary) hypertension: Secondary | ICD-10-CM | POA: Diagnosis not present

## 2023-02-12 DIAGNOSIS — I2510 Atherosclerotic heart disease of native coronary artery without angina pectoris: Secondary | ICD-10-CM | POA: Diagnosis not present

## 2023-02-12 DIAGNOSIS — E785 Hyperlipidemia, unspecified: Secondary | ICD-10-CM | POA: Diagnosis not present

## 2023-02-12 DIAGNOSIS — I69354 Hemiplegia and hemiparesis following cerebral infarction affecting left non-dominant side: Secondary | ICD-10-CM | POA: Diagnosis not present

## 2023-02-12 DIAGNOSIS — Z794 Long term (current) use of insulin: Secondary | ICD-10-CM | POA: Diagnosis not present

## 2023-02-12 DIAGNOSIS — E1140 Type 2 diabetes mellitus with diabetic neuropathy, unspecified: Secondary | ICD-10-CM | POA: Diagnosis not present

## 2023-02-14 DIAGNOSIS — I10 Essential (primary) hypertension: Secondary | ICD-10-CM | POA: Diagnosis not present

## 2023-02-14 DIAGNOSIS — I2510 Atherosclerotic heart disease of native coronary artery without angina pectoris: Secondary | ICD-10-CM | POA: Diagnosis not present

## 2023-02-14 DIAGNOSIS — E1140 Type 2 diabetes mellitus with diabetic neuropathy, unspecified: Secondary | ICD-10-CM | POA: Diagnosis not present

## 2023-02-14 DIAGNOSIS — E785 Hyperlipidemia, unspecified: Secondary | ICD-10-CM | POA: Diagnosis not present

## 2023-02-14 DIAGNOSIS — I69354 Hemiplegia and hemiparesis following cerebral infarction affecting left non-dominant side: Secondary | ICD-10-CM | POA: Diagnosis not present

## 2023-02-14 DIAGNOSIS — Z794 Long term (current) use of insulin: Secondary | ICD-10-CM | POA: Diagnosis not present

## 2023-02-17 DIAGNOSIS — S61411A Laceration without foreign body of right hand, initial encounter: Secondary | ICD-10-CM | POA: Diagnosis not present

## 2023-02-18 DIAGNOSIS — E11319 Type 2 diabetes mellitus with unspecified diabetic retinopathy without macular edema: Secondary | ICD-10-CM | POA: Diagnosis not present

## 2023-02-18 DIAGNOSIS — Z794 Long term (current) use of insulin: Secondary | ICD-10-CM | POA: Diagnosis not present

## 2023-02-18 DIAGNOSIS — E1151 Type 2 diabetes mellitus with diabetic peripheral angiopathy without gangrene: Secondary | ICD-10-CM | POA: Diagnosis not present

## 2023-02-18 DIAGNOSIS — I2510 Atherosclerotic heart disease of native coronary artery without angina pectoris: Secondary | ICD-10-CM | POA: Diagnosis not present

## 2023-02-18 DIAGNOSIS — Z8639 Personal history of other endocrine, nutritional and metabolic disease: Secondary | ICD-10-CM | POA: Diagnosis not present

## 2023-02-19 DIAGNOSIS — I69354 Hemiplegia and hemiparesis following cerebral infarction affecting left non-dominant side: Secondary | ICD-10-CM | POA: Diagnosis not present

## 2023-02-19 DIAGNOSIS — I2510 Atherosclerotic heart disease of native coronary artery without angina pectoris: Secondary | ICD-10-CM | POA: Diagnosis not present

## 2023-02-19 DIAGNOSIS — Z794 Long term (current) use of insulin: Secondary | ICD-10-CM | POA: Diagnosis not present

## 2023-02-19 DIAGNOSIS — E1140 Type 2 diabetes mellitus with diabetic neuropathy, unspecified: Secondary | ICD-10-CM | POA: Diagnosis not present

## 2023-02-19 DIAGNOSIS — E785 Hyperlipidemia, unspecified: Secondary | ICD-10-CM | POA: Diagnosis not present

## 2023-02-19 DIAGNOSIS — I10 Essential (primary) hypertension: Secondary | ICD-10-CM | POA: Diagnosis not present

## 2023-02-20 DIAGNOSIS — I10 Essential (primary) hypertension: Secondary | ICD-10-CM | POA: Diagnosis not present

## 2023-02-20 DIAGNOSIS — I69354 Hemiplegia and hemiparesis following cerebral infarction affecting left non-dominant side: Secondary | ICD-10-CM | POA: Diagnosis not present

## 2023-02-20 DIAGNOSIS — E785 Hyperlipidemia, unspecified: Secondary | ICD-10-CM | POA: Diagnosis not present

## 2023-02-20 DIAGNOSIS — E1140 Type 2 diabetes mellitus with diabetic neuropathy, unspecified: Secondary | ICD-10-CM | POA: Diagnosis not present

## 2023-02-20 DIAGNOSIS — Z794 Long term (current) use of insulin: Secondary | ICD-10-CM | POA: Diagnosis not present

## 2023-02-20 DIAGNOSIS — I2510 Atherosclerotic heart disease of native coronary artery without angina pectoris: Secondary | ICD-10-CM | POA: Diagnosis not present

## 2023-02-24 DIAGNOSIS — I639 Cerebral infarction, unspecified: Secondary | ICD-10-CM | POA: Diagnosis not present

## 2023-02-25 DIAGNOSIS — L4059 Other psoriatic arthropathy: Secondary | ICD-10-CM | POA: Diagnosis not present

## 2023-02-25 DIAGNOSIS — M1991 Primary osteoarthritis, unspecified site: Secondary | ICD-10-CM | POA: Diagnosis not present

## 2023-02-25 DIAGNOSIS — Z6836 Body mass index (BMI) 36.0-36.9, adult: Secondary | ICD-10-CM | POA: Diagnosis not present

## 2023-02-25 DIAGNOSIS — M25561 Pain in right knee: Secondary | ICD-10-CM | POA: Diagnosis not present

## 2023-02-25 DIAGNOSIS — L409 Psoriasis, unspecified: Secondary | ICD-10-CM | POA: Diagnosis not present

## 2023-02-25 DIAGNOSIS — E669 Obesity, unspecified: Secondary | ICD-10-CM | POA: Diagnosis not present

## 2023-02-25 DIAGNOSIS — Z79899 Other long term (current) drug therapy: Secondary | ICD-10-CM | POA: Diagnosis not present

## 2023-02-28 DIAGNOSIS — E785 Hyperlipidemia, unspecified: Secondary | ICD-10-CM | POA: Diagnosis not present

## 2023-02-28 DIAGNOSIS — E1140 Type 2 diabetes mellitus with diabetic neuropathy, unspecified: Secondary | ICD-10-CM | POA: Diagnosis not present

## 2023-02-28 DIAGNOSIS — I2510 Atherosclerotic heart disease of native coronary artery without angina pectoris: Secondary | ICD-10-CM | POA: Diagnosis not present

## 2023-02-28 DIAGNOSIS — Z794 Long term (current) use of insulin: Secondary | ICD-10-CM | POA: Diagnosis not present

## 2023-02-28 DIAGNOSIS — I69354 Hemiplegia and hemiparesis following cerebral infarction affecting left non-dominant side: Secondary | ICD-10-CM | POA: Diagnosis not present

## 2023-02-28 DIAGNOSIS — I10 Essential (primary) hypertension: Secondary | ICD-10-CM | POA: Diagnosis not present

## 2023-03-10 DIAGNOSIS — Z961 Presence of intraocular lens: Secondary | ICD-10-CM | POA: Diagnosis not present

## 2023-03-10 DIAGNOSIS — H52203 Unspecified astigmatism, bilateral: Secondary | ICD-10-CM | POA: Diagnosis not present

## 2023-03-10 DIAGNOSIS — H35372 Puckering of macula, left eye: Secondary | ICD-10-CM | POA: Diagnosis not present

## 2023-03-10 DIAGNOSIS — H524 Presbyopia: Secondary | ICD-10-CM | POA: Diagnosis not present

## 2023-04-15 ENCOUNTER — Ambulatory Visit (INDEPENDENT_AMBULATORY_CARE_PROVIDER_SITE_OTHER): Payer: Medicare Other | Admitting: Neurology

## 2023-04-15 ENCOUNTER — Encounter: Payer: Self-pay | Admitting: Neurology

## 2023-04-15 VITALS — BP 116/58 | HR 74 | Ht 60.0 in | Wt 194.8 lb

## 2023-04-15 DIAGNOSIS — R29898 Other symptoms and signs involving the musculoskeletal system: Secondary | ICD-10-CM | POA: Diagnosis not present

## 2023-04-15 DIAGNOSIS — I482 Chronic atrial fibrillation, unspecified: Secondary | ICD-10-CM

## 2023-04-15 DIAGNOSIS — Z8673 Personal history of transient ischemic attack (TIA), and cerebral infarction without residual deficits: Secondary | ICD-10-CM | POA: Diagnosis not present

## 2023-04-15 DIAGNOSIS — I6930 Unspecified sequelae of cerebral infarction: Secondary | ICD-10-CM

## 2023-04-15 NOTE — Progress Notes (Signed)
[facility] [address] [place]. [place] [address] [phone]       OFFICE FOLLOW UP VISIT NOTE  Ms. [name] Date of Birth:  15-Apr-1934 Medical Record Number:  [id number]   Referring MD: [name]  Reason for Referral: Stroke  HPI: Initial visit 01/06/2023 [name] is a pleasant 89 year old Caucasian lady seen today for initial office consultation visit for stroke.  History is obtained from her and her son who is accompanying her as well as April. Myrle [name] whom  spoke to me on the phone last week.  I personally reviewed imaging studies in PACS and electronic medical records.  She has past medical history of coronary artery disease s/p CABG in 2006, paroxysmal A-fib on chronic anticoagulation with Pradaxa, rheumatoid arthritis, diabetes, hypertension, prior left cerebellar stroke in 2012 without residual deficits complicated migraine.  Strokelike symptoms on 11/15/2018 with trouble saying words, confusion and slurred speech.  MRI brain negative for acute stroke.  Old left cerebellar infarct was noted on MRI remote age.  Again she had episode of vertigo nausea and head fullness On 12/28/2022 she developed sudden onset of left-sided weakness and numbness and started dropping objects from the left hand had trouble walking due to feeling of heaviness on the left side.  He normally uses a walker to walking.  Trouble holding it.  And was leaning to the left.  He refused to go to the ER to undergo any testing.  Gradually stabilized and improved somewhat over the next few days but not back to baseline.[name] consulted me over the phone on the weekend we discussed treatment options.Patient was finally convinced to get a CT scan of the head which was obtained on 12/31/2022 which has been read as not showing any acute abnormalities but my interpretation shows a right basal ganglia hypodensity (on series 2 image 15/31 years as well as some corresponding coronal sequences)  compatible with an acute stroke..  Patient has been quite compliant with the Pradaxa which she has been taking regularly and not missed any doses.  She states left-sided numbness is improved though she still feels heavy on the left.  Fine motor skills are diminished in handwriting and ability to hold any fork is improving but not back to baseline.  Her primary care physician has ordered home physical and Occupational Therapy which he is going to begin soon. she is tolerating Pradaxa well without bleeding or bruising.  Her blood pressure is well-controlled today it is 137/70.  She had some lab work done by primary care physician but I do not have access to it and did not know if lipid profile or A1c was checked.  She has followed with April. Pernell [name].  For outpatient neurology in the past Update 04/15/2023 : She returns for follow-up after last visit 3 months ago.  She is accompanied by her daughter-in-law.  Patient continues to have some left hand weakness and diminished fine motor skills.  She has finished home physical and Occupational Therapy.  She ambulates with a walker.  She has not had any falls.  She  had 2 brief dizzy episodes last month.  She describes this as a feeling of imbalance without true vertigo or accompanying nausea vomiting or blurred vision.  This is only transient.  She had to hold to avoid falling.  She has not been doing any specific hand exercises.  MRI scan of the brain on 01/27/2023 shows multiple remote lacunar infarcts in the right thalamus, left cerebellum and left middle  cerebral Beller peduncles.  The right thalamic infarct appears new compared to previous MRI from 2019 and shows faint postcontrast enhancement suggesting late subacute nature responsible for her symptoms.  MR angiogram of the brain shows no significant large vessel stenosis only mild atheromatous changes in the left cavernous ICA.  Carotid ultrasound on 01/22/2023 shows no significant stenosis.  She remains on pradaxa which  she is tolerating well without bleeding and only minor bruising.  She has no new complaints today. ROS:   14 system review of systems is positive for weakness, numbness, decreased coordination gait difficulty, imbalance and all other systems negative.  PMH:  Past Medical History:  Diagnosis Date   Anginal pain    "saw doctor-think it was esophagus spasm"   Arthritis    Atrial flutter 11/02/2013   Cataract    left eye   Coronary artery disease    Diabetes mellitus without complication    type 2   Essential hypertension, benign 11/02/2013   H/O jaundice    as child   Headache 2015   complicated migraine x2    Hearing trouble    Heart disease    Hepatitis    hx of hepatitis A as child   High cholesterol    under control   Long term (current) use of anticoagulants 11/02/2013   Pradaxa    Measles    as child   Mouth dryness    Mumps    as teenager   Numbness and tingling    lips   Osteoporosis    "unsure-maybe in knees"   Psoriasis    Psoriatic arthritis    Stroke 2012   Vertigo    being monitored, occasional    Social History:  Social History   Socioeconomic History   Marital status: Married    Spouse name: [name]   Number of children: 3   Years of education: 16   Highest education level: Bachelor's degree (e.g., BA, AB, BS)  Occupational History   Occupation: Retired  Tobacco Use   Smoking status: Never   Smokeless tobacco: Never  Vaping Use   Vaping Use: Never used  Substance and Sexual Activity   Alcohol use: No    Alcohol/week: 0.0 standard drinks of alcohol   Drug use: No   Sexual activity: Not on file  Other Topics Concern   Not on file  Social History Narrative   Patient is married with 3 children.   Patient is left handed.   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   Patient drinks 3 cups daily.   Social Determinants of Health   Financial Resource Strain: Not on file  Food Insecurity: Not on file  Transportation Needs: Not on file   Physical Activity: Not on file  Stress: Not on file  Social Connections: Not on file  Intimate Partner Violence: Not on file    Medications:   Current Outpatient Medications on File Prior to Visit  Medication Sig Dispense Refill   Accu-Chek FastClix Lancets MISC .     acetaminophen (TYLENOL) 650 MG CR tablet Take 650 mg by mouth as needed.     B-D UF III MINI PEN NEEDLES 31G X 5 MM MISC SMARTSIG:Pre-Filled Pen Syringe SUB-Q As Directed     calcium elemental as carbonate (BARIATRIC TUMS ULTRA) 400 MG chewable tablet Chew 1,000 mg by mouth every evening.     carvedilol (COREG) 6.25 MG tablet Take 1 tablet (6.25 mg total) by mouth 2 (two) times  daily. 180 tablet 3   cholecalciferol (VITAMIN D) 1000 units tablet Take 2,000 Units by mouth daily.     clobetasol cream (TEMOVATE) 0.05 % Apply 1 application topically 2 (two) times daily as needed (skin irritation).     clonazePAM (KLONOPIN) 0.5 MG tablet Take 0.5 mg by mouth at bedtime. Pt takes 0.5mg  at bedtime and then a second dose .25mg  tablet if she wakes up in the middle of the night .     Continuous Blood Gluc Sensor (FREESTYLE LIBRE 2 SENSOR) MISC as directed.     docusate sodium (COLACE) 100 MG capsule as needed.     ENBREL 50 MG/ML injection Inject 50 mg into the skin once a week.      glucose blood (ACCU-CHEK GUIDE) test strip USE AS DIRECTED THREE TIMES DAILY TO  CHECK  BLOOD  GLUCOSE     HUMALOG MIX 50/50 KWIKPEN (50-50) 100 UNIT/ML Kwikpen Inject 35 Units into the skin 2 (two) times daily. 56 Units in AM; 10 Units in the afternoon.     Insulin Pen Needle (B-D UF III MINI PEN NEEDLES) 31G X 5 MM MISC See admin instructions.     isosorbide mononitrate (IMDUR) 30 MG 24 hr tablet Take 0.5 tablets (15 mg total) by mouth daily. 45 tablet 3   loratadine (CLARITIN) 10 MG tablet daily at 6 (six) AM.     nitroGLYCERIN (NITROSTAT) 0.4 MG SL tablet Place 1 tablet (0.4 mg total) under the tongue every 5 (five) minutes x 3 doses as needed for chest  pain. 25 tablet 3   ondansetron (ZOFRAN) 4 MG tablet Take 1 tablet by mouth as needed.     polyethylene glycol (MIRALAX / GLYCOLAX) 17 g packet Take 17 g by mouth daily.     PRADAXA 150 MG CAPS Take 150 mg by mouth 2 (two) times daily.      Probiotic Product (ALIGN PO) Take 1 capsule by mouth daily.     ramipril (ALTACE) 5 MG capsule TAKE ONE CAPSULE BY MOUTH TWICE DAILY 60 capsule 11   rosuvastatin (CRESTOR) 10 MG tablet Take 10 mg by mouth at bedtime.      spironolactone (ALDACTONE) 25 MG tablet TAKE 1/2 TABLET BY MOUTH DAILY 45 tablet 3   vitamin B-12 (CYANOCOBALAMIN) 250 MCG tablet Take 250 mcg by mouth daily.     No current facility-administered medications on file prior to visit.    Allergies:   Allergies  Allergen Reactions   Codeine Nausea And Vomiting   Penicillins Swelling and Other (See Comments)    Did it involve swelling of the face/tongue/throat, SOB, or low BP? Yes Did it involve sudden or severe rash/hives, skin peeling, or any reaction on the inside of your mouth or nose? Unknown Did you need to seek medical attention at a hospital or doctor's office? Unknown When did it last happen?  Unknown If all above answers are "NO", may proceed with cephalosporin use.  Swelling of tongue   Acetaminophen-Codeine     Other reaction(s): Up set stomach   Atenolol Other (See Comments)   Atorvastatin     Muscle soreness   Chloramphenicol Other (See Comments)   Dapagliflozin     Other reaction(s): yeast infections   Empagliflozin     Other reaction(s): yeast infections   Liraglutide     Other reaction(s): constipation, bloating, nausea   Metformin Hcl Other (See Comments)   Metoprolol Other (See Comments)   Zetia [Ezetimibe] Other (See Comments)   Sertraline Palpitations  Physical Exam General: well developed, well nourished pleasant elderly Caucasian lady, seated, in no evident distress Head: head normocephalic and atraumatic.   Neck: supple with no carotid or  supraclavicular bruits Cardiovascular: regular rate and rhythm, no murmurs Musculoskeletal: no deformity Skin:  no rash/petichiae Vascular:  Normal pulses all extremities  Neurologic Exam Mental Status: Awake and fully alert. Oriented to place and time. Recent and remote memory intact. Attention span, concentration and fund of knowledge appropriate. Mood and affect appropriate.   Cognitive testing not done today Cranial Nerves: Fundoscopic exam reveals sharp disc margins. Pupils equal, briskly reactive to light. Extraocular movements full without nystagmus. Visual fields full to confrontation. Hearing intact. Facial sensation intact. Face, tongue, palate moves normally and symmetrically.  Motor: Normal bulk and tone. Normal strength in all tested extremity muscles except slight diminished fine motor skills in the left hand.  Orbits right over left upper extremity.  Slightly increased tone in the left upper and lower extremity.. Sensory.: intact to touch , pinprick , position and vibratory sensation.  Coordination: Rapid alternating movements normal in all extremities. Finger-to-nose and heel-to-shin performed accurately bilaterally. Gait and Station: Arises from chair with slight difficulty. Stance is stooped.  Uses a walker and walks with cautious gait with slight dragging of the left leg.  Slightly unsteady while standing on left foot unsupported Reflexes: 1+ and symmetric. Toes downgoing.   NIHSS  0 Modified Rankin  3   ASSESSMENT: 89 year old Caucasian lady with left hemiparesis and numbness in December 2023 due to right subcortical thalamic infarct likely  from small vessel disease.  She has chronic atrial fibrillation and is on long-term anticoagulation with Pradaxa and has past history of left cerebellar stroke in 2012 and possible TIA in 2019.  Vascular risk factors of diabetes, hypertension, hyperlipidemia , atrial fibrillation, mild obesity     PLAN:I had a long d/w patient and her  daughter in law about her recent stroke, chronic atrial fibrillation,risk for recurrent stroke/TIAs, personally independently reviewed imaging studies and stroke evaluation results and answered questions.Continue Pradaxa (dabigatran) twice a day  for secondary stroke prevention there is no definite evidence that switching to alternative anticoagulant regimen necessarily superior.  And maintain strict control of hypertension with blood pressure goal below 130/90, diabetes with hemoglobin A1c goal below 6.5% and lipids with LDL cholesterol goal below 70 mg/dL. I also advised the patient to eat a healthy diet with plenty of whole grains, cereals, fruits and vegetables, exercise regularly and maintain ideal body weight .  She was advised to use a walker at all times and we discussed fall safety precautions.  Followup in the future with me in 1 year or call earlier if necessary.  Greater than 50% time during this 35-minute was spent  on counseling and coordination of care about her lacunar stroke and discussion about evaluation and. Treatment.  Delia Heady, MD Note: This document was prepared with digital dictation and possible smart phrase technology. Any transcriptional errors that result from this process are unintentional.

## 2023-04-15 NOTE — Patient Instructions (Signed)
I had a long d/w patient and her daughter in law about her recent stroke, chronic atrial fibrillation,risk for recurrent stroke/TIAs, personally independently reviewed imaging studies and stroke evaluation results and answered questions.Continue Pradaxa (dabigatran) twice a day  for secondary stroke prevention there is no definite evidence that switching to alternative anticoagulant regimen necessarily superior.  And maintain strict control of hypertension with blood pressure goal below 130/90, diabetes with hemoglobin A1c goal below 6.5% and lipids with LDL cholesterol goal below 70 mg/dL. I also advised the patient to eat a healthy diet with plenty of whole grains, cereals, fruits and vegetables, exercise regularly and maintain ideal body weight .  She was advised to use a walker at all times and we discussed fall safety precautions.  Followup in the future with me in 1 year or call earlier if necessary.

## 2023-05-07 DIAGNOSIS — I480 Paroxysmal atrial fibrillation: Secondary | ICD-10-CM | POA: Diagnosis not present

## 2023-05-07 DIAGNOSIS — E11319 Type 2 diabetes mellitus with unspecified diabetic retinopathy without macular edema: Secondary | ICD-10-CM | POA: Diagnosis not present

## 2023-05-07 DIAGNOSIS — I10 Essential (primary) hypertension: Secondary | ICD-10-CM | POA: Diagnosis not present

## 2023-05-07 DIAGNOSIS — F320 Major depressive disorder, single episode, mild: Secondary | ICD-10-CM | POA: Diagnosis not present

## 2023-05-07 DIAGNOSIS — K219 Gastro-esophageal reflux disease without esophagitis: Secondary | ICD-10-CM | POA: Diagnosis not present

## 2023-05-07 DIAGNOSIS — E7800 Pure hypercholesterolemia, unspecified: Secondary | ICD-10-CM | POA: Diagnosis not present

## 2023-05-18 NOTE — Progress Notes (Unsigned)
Cardiology Office Note:   Date:  05/20/2023  ID:  [name], DOB 25-Jan-1934, MRN [id number]  History of Present Illness:   [name] is a 89 y.o. female with history of CAD s/p CABG in 2006, paroxysmal Afib on pradaxa, RA, DMII, HTN, prior CVA, migraines who was previously followed by Dr. Katrinka Blazing who now returns to clinic for follow-up.  Was last seen in clinic by Dr. Katrinka Blazing on 10/2022. BP was on the softer side at that time and she was instructed to monitor closely at home with plans to cut back on diuretic therapy if needed.   Today, the patient states that she has been having episodes of chest heaviness that usually occurs after eating breakfast, but can also occur with exertion. She is unsure if symptoms worsen with activity but has noted that they have become more frequent over the past several weeks.No SOB, orthopnea or PND. Has noted LE edema in her legs that comes and goes. Rare episodes of palpitations. No lightheadedness, dizziness, or syncope.  Discussed option of conservative approach to chest pain vs further testing with stress testing. Given that the patient is functional at home and helps assist in the care of her husband, she would like to pursue stress testing at this time.   Past Medical History:  Diagnosis Date   Anginal pain (HCC)    "saw doctor-think it was esophagus spasm"   Arthritis    Atrial flutter (HCC) 11/02/2013   Cataract    left eye   Coronary artery disease    Diabetes mellitus without complication (HCC)    type 2   Essential hypertension, benign 11/02/2013   H/O jaundice    as child   Headache 2015   complicated migraine x2    Hearing trouble    Heart disease    Hepatitis    hx of hepatitis A as child   High cholesterol    under control   Long term (current) use of anticoagulants 11/02/2013   Pradaxa    Measles    as child   Mouth dryness    Mumps    as teenager   Numbness and tingling    lips   Osteoporosis    "unsure-maybe in knees"    Psoriasis    Psoriatic arthritis (HCC)    Stroke (HCC) 2012   Vertigo    being monitored, occasional     ROS: AS per HPI  Studies Reviewed:    EKG:  No new tracing  Cardiac Studies & Procedures     STRESS TESTS  MYOCARDIAL PERFUSION IMAGING 07/25/2015  Narrative  Nuclear stress EF: 72%.  The study is normal.  This is a low risk study.  The left ventricular ejection fraction is normal (55-65%).   ECHOCARDIOGRAM  ECHOCARDIOGRAM COMPLETE 02/26/2022  Narrative ECHOCARDIOGRAM REPORT    Patient Name:   [name] [facility] Date of Exam: 02/26/2022 Medical Rec #:  [id number]       Height:       62.0 in Accession #:    [id number]      Weight:       202.0 lb Date of Birth:  Mar 24, 1934       BSA:          1.920 m Patient Age:    88 years        BP:           111/58 mmHg Patient Gender: F  HR:           68 bpm. Exam Location:  [address]  Procedure: 2D Echo, Color Doppler, Cardiac Doppler and Intracardiac Opacification Agent  Indications:    R55 Syncope  History:        Patient has prior history of Echocardiogram examinations, most recent 07/20/2020. CAD, TIA, Arrythmias:Atrial Flutter and 1st degree AV block, Signs/Symptoms:Syncope; Risk Factors:Hypertension and Diabetes.  Sonographer:    Jorje Guild [facility], RDCS Referring Phys: [id number] [facility]   Sonographer Comments: Technically difficult study due to poor echo windows and suboptimal apical window. Image acquisition challenging due to patient body habitus. IMPRESSIONS   1. Left ventricular ejection fraction, by estimation, is 55 to 60%. The left ventricle has normal function. Left ventricular diastolic parameters are consistent with Grade I diastolic dysfunction (impaired relaxation). 2. Right ventricular systolic function is normal. The right ventricular size is normal. Tricuspid regurgitation signal is inadequate for assessing PA pressure. 3. Severe mitral annular calcification. 4.  Aortic valve regurgitation is not visualized. Aortic valve sclerosis/calcification is present, without any evidence of aortic stenosis. 5. The inferior vena cava is normal in size with greater than 50% respiratory variability, suggesting right atrial pressure of 3 mmHg.  Comparison(s): No significant change from prior study. 07/20/20 EF 60-65%.  FINDINGS Left Ventricle: Left ventricular ejection fraction, by estimation, is 55 to 60%. The left ventricle has normal function. Definity contrast agent was given IV to delineate the left ventricular endocardial borders. The left ventricular internal cavity size was normal in size. Left ventricular hypertrophy of the sigmoid septum segment. Left ventricular diastolic parameters are consistent with Grade I diastolic dysfunction (impaired relaxation).  Right Ventricle: The right ventricular size is normal. No increase in right ventricular wall thickness. Right ventricular systolic function is normal. Tricuspid regurgitation signal is inadequate for assessing PA pressure.  Left Atrium: Left atrial size was normal in size.  Right Atrium: Right atrial size was normal in size.  Pericardium: There is no evidence of pericardial effusion.  Mitral Valve: Severe mitral annular calcification.  Tricuspid Valve: Tricuspid valve regurgitation is not demonstrated.  Aortic Valve: Aortic valve regurgitation is not visualized. Aortic valve sclerosis/calcification is present, without any evidence of aortic stenosis.  Aorta: The aortic root and ascending aorta are structurally normal, with no evidence of dilitation.  Venous: The inferior vena cava is normal in size with greater than 50% respiratory variability, suggesting right atrial pressure of 3 mmHg.   LEFT VENTRICLE PLAX 2D LVIDd:         4.00 cm   Diastology LVIDs:         2.80 cm   LV e' medial:    7.30 cm/s LV PW:         0.90 cm   LV E/e' medial:  12.9 LV IVS:        0.90 cm   LV e' lateral:   8.76  cm/s LVOT diam:     2.20 cm   LV E/e' lateral: 10.7 LV SV:         113 LV SV Index:   59 LVOT Area:     3.80 cm   RIGHT VENTRICLE             IVC RV Basal diam:  3.20 cm     IVC diam: 1.70 cm RV S prime:     12.50 cm/s TAPSE (M-mode): 1.9 cm  LEFT ATRIUM             Index  RIGHT ATRIUM           Index LA diam:        3.50 cm 1.82 cm/m   RA Pressure: 3.00 mmHg LA Vol (A2C):   39.7 ml 20.68 ml/m  RA Area:     17.10 cm LA Vol (A4C):   63.6 ml 33.13 ml/m  RA Volume:   43.70 ml  22.76 ml/m LA Biplane Vol: 52.4 ml 27.29 ml/m AORTIC VALVE LVOT Vmax:   160.00 cm/s LVOT Vmean:  98.500 cm/s LVOT VTI:    0.297 m  AORTA Ao Root diam: 3.10 cm Ao Asc diam:  3.20 cm  MITRAL VALVE                TRICUSPID VALVE Estimated RAP:  3.00 mmHg MV Decel Time: 292 msec MV E velocity: 94.00 cm/s   SHUNTS MV A velocity: 130.00 cm/s  Systemic VTI:  0.30 m MV E/A ratio:  0.72         Systemic Diam: 2.20 cm  Carolan Clines Electronically signed by Carolan Clines Signature Date/Time: 02/26/2022/6:03:36 PM    Final    MONITORS  LONG TERM MONITOR (3-14 DAYS) 03/23/2022  Narrative  Patient had a minimum heart rate of 51 bpm, maximum heart rate of 184 bpm, and average heart rate of 76 bpm.  Predominant underlying rhythm was sinus rhythm.  Nineteen runs of P-SVT, longest 15 beats , fastest 184 bpm.  Isolated PACs were rare (<1.0%).  Isolated PVCs were occasional (1.0%).  Triggered and diary events associated with sinus rhythm or PVC.  No evidence of arrhythmogenic syncope.            Risk Assessment/Calculations:    CHA2DS2-VASc Score = 9  This indicates a 12.2% annual risk of stroke. The patient's score is based upon: CHF History: 1 HTN History: 1 Diabetes History: 1 Stroke History: 2 Vascular Disease History: 1 Age Score: 2 Gender Score: 1             Physical Exam:   VS:  BP 126/60   Pulse 63   Ht 5' (1.524 m)   Wt 195 lb 3.2 oz (88.5 kg)   SpO2 95%   BMI  38.12 kg/m    Wt Readings from Last 3 Encounters:  05/20/23 195 lb 3.2 oz (88.5 kg)  04/15/23 194 lb 12.8 oz (88.4 kg)  01/06/23 200 lb (90.7 kg)     GEN: Well nourished, well developed in no acute distress NECK: No JVD; No carotid bruits CARDIAC: RRR, no murmurs RESPIRATORY:  Clear to auscultation without rales, wheezing or rhonchi  ABDOMEN: Soft, non-tender, non-distended EXTREMITIES:  Trace pedal edema, warm  ASSESSMENT AND PLAN:   #CAD s/p CABG: -Currently with worsening chest heaviness that has been ongoing for the past several months -Given progressive nature of symptoms, will check NM PET and only pursue further work-up if evidence of significant ischemia -Not on ASA due to need for [facility] -Continue coreg 6.25mg  BID -Continue imdur 30mg  daily -Continue ramipril 5mg  daily -Continue crestor 10mg  daily  #Chronic Diastolic HF: -Currently euvolemic and compensated with NYHA class II-III symptoms -Continue spiro 12.5mg  daily  #Paroxysmal Afib: -Maintaining NSR currently -Continue pradaxa 150mg  daily -Continue coreg 6.25mg  BID  #HTN: -Continue coreg 6.25mg  BID -Continue imdur 30mg  daily -Continue ramipril 5mg  daily  #HLD: -Continue crestor 10mg  daily -LDL 79  #DMII: -Continue insulin     Shared Decision Making/Informed Consent The risks [chest pain, shortness of breath, cardiac arrhythmias, dizziness, blood pressure fluctuations,  myocardial infarction, stroke/transient ischemic attack, nausea, vomiting, allergic reaction, radiation exposure, metallic taste sensation and life-threatening complications (estimated to be 1 in 10,000)], benefits (risk stratification, diagnosing coronary artery disease, treatment guidance) and alternatives of a cardiac PET stress test were discussed in detail with Ms. Delisi and she agrees to proceed.   Signed, Meriam Sprague, MD

## 2023-05-20 ENCOUNTER — Encounter: Payer: Self-pay | Admitting: Cardiology

## 2023-05-20 ENCOUNTER — Ambulatory Visit: Payer: Medicare Other | Attending: Cardiology | Admitting: Cardiology

## 2023-05-20 VITALS — BP 126/60 | HR 63 | Ht 60.0 in | Wt 195.2 lb

## 2023-05-20 DIAGNOSIS — E119 Type 2 diabetes mellitus without complications: Secondary | ICD-10-CM | POA: Diagnosis not present

## 2023-05-20 DIAGNOSIS — I480 Paroxysmal atrial fibrillation: Secondary | ICD-10-CM | POA: Insufficient documentation

## 2023-05-20 DIAGNOSIS — D6869 Other thrombophilia: Secondary | ICD-10-CM | POA: Insufficient documentation

## 2023-05-20 DIAGNOSIS — I5032 Chronic diastolic (congestive) heart failure: Secondary | ICD-10-CM | POA: Diagnosis not present

## 2023-05-20 DIAGNOSIS — I10 Essential (primary) hypertension: Secondary | ICD-10-CM

## 2023-05-20 DIAGNOSIS — I25700 Atherosclerosis of coronary artery bypass graft(s), unspecified, with unstable angina pectoris: Secondary | ICD-10-CM | POA: Diagnosis not present

## 2023-05-20 DIAGNOSIS — Z794 Long term (current) use of insulin: Secondary | ICD-10-CM | POA: Diagnosis not present

## 2023-05-20 DIAGNOSIS — R079 Chest pain, unspecified: Secondary | ICD-10-CM | POA: Insufficient documentation

## 2023-05-20 NOTE — Patient Instructions (Signed)
Medication Instructions:   Your physician recommends that you continue on your current medications as directed. Please refer to the Current Medication list given to you today.  *If you need a refill on your cardiac medications before your next appointment, please call your pharmacy*    Testing/Procedures:  How to Prepare for Your Cardiac PET/CT Stress Test:  1. Please do not take these medications before your test:   Medications that may interfere with the cardiac pharmacological stress agent (ex. nitrates - including erectile dysfunction medications, isosorbide mononitrate, tamulosin or beta-blockers) the day of the exam. (Erectile dysfunction medication should be held for at least 72 hrs prior to test)  HOLD YOUR IMDUR, CARVEDILOL, AND NITROGLYCERIN THE DAY OF THIS EXAM  Your remaining medications may be taken with water.  2. Nothing to eat or drink, except water, 3 hours prior to arrival time.   NO caffeine/decaffeinated products, or chocolate 12 hours prior to arrival.  3. NO perfume, cologne or lotion  4. Total time is 1 to 2 hours; you may want to bring reading material for the waiting time.  5. Please report to Radiology at the [facility] Main Entrance 30 minutes early for your test.  [address] [place], [place] [address]  Diabetic Preparation:  Hold oral medications. You may take NPH and Lantus insulin. Do not take Humalog or Humulin R (Regular Insulin) the day of your test. Check blood sugars prior to leaving the house. If able to eat breakfast prior to 3 hour fasting, you may take all medications, including your insulin, Do not worry if you miss your breakfast dose of insulin - start at your next meal.   In preparation for your appointment, medication and supplies will be purchased.  Appointment availability is limited, so if you need to cancel or reschedule, please call the Radiology Department at [phone]  24 hours in advance to avoid a  cancellation fee of $100.00  What to Expect After you Arrive:  Once you arrive and check in for your appointment, you will be taken to a preparation room within the Radiology Department.  A technologist or Nurse will obtain your medical history, verify that you are correctly prepped for the exam, and explain the procedure.  Afterwards,  an IV will be started in your arm and electrodes will be placed on your skin for EKG monitoring during the stress portion of the exam. Then you will be escorted to the PET/CT scanner.  There, staff will get you positioned on the scanner and obtain a blood pressure and EKG.  During the exam, you will continue to be connected to the EKG and blood pressure machines.  A small, safe amount of a radioactive tracer will be injected in your IV to obtain a series of pictures of your heart along with an injection of a stress agent.    After your Exam:  It is recommended that you eat a meal and drink a caffeinated beverage to counter act any effects of the stress agent.  Drink plenty of fluids for the remainder of the day and urinate frequently for the first couple of hours after the exam.  Your doctor will inform you of your test results within 7-10 business days.  For questions about your test or how to prepare for your test, please call: Rockwell Alexandria, Cardiac Imaging Nurse Navigator  Larey Brick, Cardiac Imaging Nurse Navigator Office: [phone]    Follow-Up: At [facility], you and your health needs are our  priority.  As part of our continuing mission to provide you with exceptional heart care, we have created designated Provider Care Teams.  These Care Teams include your primary Cardiologist (physician) and Advanced Practice Providers (APPs -  Physician Assistants and Nurse Practitioners) who all work together to provide you with the care you need, when you need it.  We recommend signing up for the patient portal called "MyChart".  Sign up information is  provided on this After Visit Summary.  MyChart is used to connect with patients for Virtual Visits (Telemedicine).  Patients are able to view lab/test results, encounter notes, upcoming appointments, etc.  Non-urgent messages can be sent to your provider as well.   To learn more about what you can do with MyChart, go to [email or web address].    Your next appointment:   6 month(s)  Provider:   Jari Favre, PA-C, Ronie Spies, PA-C, Robin Searing, NP, Jacolyn Reedy, PA-C, Eligha Bridegroom, NP, or Tereso Newcomer, PA-C

## 2023-06-03 DIAGNOSIS — Z794 Long term (current) use of insulin: Secondary | ICD-10-CM | POA: Diagnosis not present

## 2023-06-03 DIAGNOSIS — E1151 Type 2 diabetes mellitus with diabetic peripheral angiopathy without gangrene: Secondary | ICD-10-CM | POA: Diagnosis not present

## 2023-06-03 DIAGNOSIS — Z8639 Personal history of other endocrine, nutritional and metabolic disease: Secondary | ICD-10-CM | POA: Diagnosis not present

## 2023-06-03 DIAGNOSIS — E11319 Type 2 diabetes mellitus with unspecified diabetic retinopathy without macular edema: Secondary | ICD-10-CM | POA: Diagnosis not present

## 2023-06-03 DIAGNOSIS — I2510 Atherosclerotic heart disease of native coronary artery without angina pectoris: Secondary | ICD-10-CM | POA: Diagnosis not present

## 2023-06-06 ENCOUNTER — Other Ambulatory Visit (HOSPITAL_COMMUNITY): Payer: Self-pay

## 2023-06-06 ENCOUNTER — Other Ambulatory Visit: Payer: Self-pay

## 2023-06-06 MED ORDER — SPIRONOLACTONE 25 MG PO TABS: 12.5000 mg | ORAL_TABLET | Freq: Every day | ORAL | 3 refills | Status: DC

## 2023-06-06 MED ORDER — INSULIN LISPRO PROT & LISPRO (50-50 MIX) 100 UNIT/ML KWIKPEN
34.0000 [IU] | PEN_INJECTOR | Freq: Three times a day (TID) | SUBCUTANEOUS | 9 refills | Status: DC
  Filled 2023-06-06: qty 15, 22d supply, fill #0
  Filled 2023-06-25: qty 15, 22d supply, fill #1

## 2023-06-26 ENCOUNTER — Other Ambulatory Visit (HOSPITAL_COMMUNITY): Payer: Self-pay

## 2023-08-22 ENCOUNTER — Encounter (HOSPITAL_COMMUNITY): Payer: Self-pay

## 2023-08-25 ENCOUNTER — Telehealth (HOSPITAL_COMMUNITY): Payer: Self-pay | Admitting: *Deleted

## 2023-08-25 MED ORDER — REGADENOSON 0.4 MG/5ML IV SOLN
0.4000 mg | Freq: Once | INTRAVENOUS | Status: AC
  Administered 2023-08-26: 0.4 mg via INTRAVENOUS

## 2023-08-25 NOTE — Telephone Encounter (Signed)
 Received call from patient regarding upcoming cardiac imaging study; pt verbalizes understanding of appt date/time, parking situation and where to check in, pre-test NPO status and medications ordered, and verified current allergies; name and call back number provided for further questions should they arise Johney Frame RN Navigator Cardiac Imaging [facility] [phone] office [phone] cell

## 2023-08-26 ENCOUNTER — Encounter (HOSPITAL_COMMUNITY)
Admission: RE | Admit: 2023-08-26 | Discharge: 2023-08-26 | Disposition: A | Discharge status: Home / Self Care | Payer: Medicare Other | Source: Ambulatory Visit | Attending: Cardiology | Admitting: Cardiology

## 2023-08-26 DIAGNOSIS — R079 Chest pain, unspecified: Secondary | ICD-10-CM | POA: Diagnosis not present

## 2023-08-26 DIAGNOSIS — I10 Essential (primary) hypertension: Secondary | ICD-10-CM | POA: Diagnosis not present

## 2023-08-26 DIAGNOSIS — E119 Type 2 diabetes mellitus without complications: Secondary | ICD-10-CM | POA: Insufficient documentation

## 2023-08-26 DIAGNOSIS — I480 Paroxysmal atrial fibrillation: Secondary | ICD-10-CM | POA: Insufficient documentation

## 2023-08-26 DIAGNOSIS — D6869 Other thrombophilia: Secondary | ICD-10-CM | POA: Diagnosis not present

## 2023-08-26 DIAGNOSIS — I2510 Atherosclerotic heart disease of native coronary artery without angina pectoris: Secondary | ICD-10-CM | POA: Diagnosis not present

## 2023-08-26 DIAGNOSIS — I517 Cardiomegaly: Secondary | ICD-10-CM | POA: Diagnosis not present

## 2023-08-26 DIAGNOSIS — I25700 Atherosclerosis of coronary artery bypass graft(s), unspecified, with unstable angina pectoris: Secondary | ICD-10-CM | POA: Diagnosis not present

## 2023-08-26 DIAGNOSIS — I5032 Chronic diastolic (congestive) heart failure: Secondary | ICD-10-CM | POA: Diagnosis not present

## 2023-08-26 DIAGNOSIS — I700 Atherosclerosis of aorta: Secondary | ICD-10-CM | POA: Diagnosis not present

## 2023-08-26 LAB — NM PET CT CARDIAC PERFUSION MULTI W/ABSOLUTE BLOODFLOW
LV dias vol: 69 mL (ref 46–106)
LV sys vol: 29 mL
MBFR: 2.39
Nuc Rest EF: 70 %
Nuc Stress EF: 67 %
Rest MBF: 1.09 ml/g/min
Rest Nuclear Isotope Dose: 22.9 mCi
ST Depression (mm): 0 mm
Stress MBF: 2.61 ml/g/min
Stress Nuclear Isotope Dose: 22.9 mCi
TID: 1.21

## 2023-08-26 MED ORDER — REGADENOSON 0.4 MG/5ML IV SOLN
INTRAVENOUS | Status: AC
  Filled 2023-08-26: qty 5

## 2023-08-26 MED ORDER — RUBIDIUM RB82 GENERATOR (RUBYFILL)
22.9300 | PACK | Freq: Once | INTRAVENOUS | Status: AC
  Administered 2023-08-26: 22.93 via INTRAVENOUS

## 2023-08-26 MED ORDER — RUBIDIUM RB82 GENERATOR (RUBYFILL)
22.9100 | PACK | Freq: Once | INTRAVENOUS | Status: AC
  Administered 2023-08-26: 22.91 via INTRAVENOUS

## 2023-09-02 DIAGNOSIS — E11319 Type 2 diabetes mellitus with unspecified diabetic retinopathy without macular edema: Secondary | ICD-10-CM | POA: Diagnosis not present

## 2023-09-02 DIAGNOSIS — Z794 Long term (current) use of insulin: Secondary | ICD-10-CM | POA: Diagnosis not present

## 2023-09-02 DIAGNOSIS — Z8639 Personal history of other endocrine, nutritional and metabolic disease: Secondary | ICD-10-CM | POA: Diagnosis not present

## 2023-09-02 DIAGNOSIS — E1151 Type 2 diabetes mellitus with diabetic peripheral angiopathy without gangrene: Secondary | ICD-10-CM | POA: Diagnosis not present

## 2023-09-02 DIAGNOSIS — I2510 Atherosclerotic heart disease of native coronary artery without angina pectoris: Secondary | ICD-10-CM | POA: Diagnosis not present

## 2023-09-09 DIAGNOSIS — R5383 Other fatigue: Secondary | ICD-10-CM | POA: Diagnosis not present

## 2023-09-09 DIAGNOSIS — L4059 Other psoriatic arthropathy: Secondary | ICD-10-CM | POA: Diagnosis not present

## 2023-09-09 DIAGNOSIS — E669 Obesity, unspecified: Secondary | ICD-10-CM | POA: Diagnosis not present

## 2023-09-09 DIAGNOSIS — L409 Psoriasis, unspecified: Secondary | ICD-10-CM | POA: Diagnosis not present

## 2023-09-09 DIAGNOSIS — M1991 Primary osteoarthritis, unspecified site: Secondary | ICD-10-CM | POA: Diagnosis not present

## 2023-09-09 DIAGNOSIS — Z79899 Other long term (current) drug therapy: Secondary | ICD-10-CM | POA: Diagnosis not present

## 2023-09-09 DIAGNOSIS — Z6834 Body mass index (BMI) 34.0-34.9, adult: Secondary | ICD-10-CM | POA: Diagnosis not present

## 2023-09-23 DIAGNOSIS — H903 Sensorineural hearing loss, bilateral: Secondary | ICD-10-CM | POA: Diagnosis not present

## 2023-10-07 ENCOUNTER — Inpatient Hospital Stay (HOSPITAL_COMMUNITY)
Admission: EM | Admit: 2023-10-07 | Discharge: 2023-10-13 | DRG: 522 | Disposition: A | Discharge status: Skilled Nursing Facility | Payer: Medicare Other | Attending: Family Medicine | Admitting: Family Medicine

## 2023-10-07 ENCOUNTER — Other Ambulatory Visit: Payer: Self-pay

## 2023-10-07 ENCOUNTER — Emergency Department (HOSPITAL_COMMUNITY): Payer: Medicare Other

## 2023-10-07 ENCOUNTER — Encounter (HOSPITAL_COMMUNITY): Payer: Self-pay

## 2023-10-07 DIAGNOSIS — N1831 Chronic kidney disease, stage 3a: Secondary | ICD-10-CM | POA: Diagnosis not present

## 2023-10-07 DIAGNOSIS — R0989 Other specified symptoms and signs involving the circulatory and respiratory systems: Secondary | ICD-10-CM | POA: Diagnosis not present

## 2023-10-07 DIAGNOSIS — L4050 Arthropathic psoriasis, unspecified: Secondary | ICD-10-CM | POA: Diagnosis not present

## 2023-10-07 DIAGNOSIS — Z01810 Encounter for preprocedural cardiovascular examination: Secondary | ICD-10-CM | POA: Diagnosis not present

## 2023-10-07 DIAGNOSIS — E7800 Pure hypercholesterolemia, unspecified: Secondary | ICD-10-CM | POA: Diagnosis present

## 2023-10-07 DIAGNOSIS — I447 Left bundle-branch block, unspecified: Secondary | ICD-10-CM | POA: Diagnosis present

## 2023-10-07 DIAGNOSIS — Z8673 Personal history of transient ischemic attack (TIA), and cerebral infarction without residual deficits: Secondary | ICD-10-CM

## 2023-10-07 DIAGNOSIS — I25118 Atherosclerotic heart disease of native coronary artery with other forms of angina pectoris: Secondary | ICD-10-CM | POA: Diagnosis not present

## 2023-10-07 DIAGNOSIS — R2689 Other abnormalities of gait and mobility: Secondary | ICD-10-CM | POA: Diagnosis not present

## 2023-10-07 DIAGNOSIS — M6281 Muscle weakness (generalized): Secondary | ICD-10-CM | POA: Diagnosis not present

## 2023-10-07 DIAGNOSIS — I25709 Atherosclerosis of coronary artery bypass graft(s), unspecified, with unspecified angina pectoris: Secondary | ICD-10-CM | POA: Diagnosis present

## 2023-10-07 DIAGNOSIS — S72011A Unspecified intracapsular fracture of right femur, initial encounter for closed fracture: Secondary | ICD-10-CM | POA: Diagnosis present

## 2023-10-07 DIAGNOSIS — Z7902 Long term (current) use of antithrombotics/antiplatelets: Secondary | ICD-10-CM

## 2023-10-07 DIAGNOSIS — S72041A Displaced fracture of base of neck of right femur, initial encounter for closed fracture: Secondary | ICD-10-CM | POA: Diagnosis not present

## 2023-10-07 DIAGNOSIS — R4182 Altered mental status, unspecified: Secondary | ICD-10-CM | POA: Diagnosis not present

## 2023-10-07 DIAGNOSIS — M069 Rheumatoid arthritis, unspecified: Secondary | ICD-10-CM | POA: Diagnosis present

## 2023-10-07 DIAGNOSIS — M25559 Pain in unspecified hip: Secondary | ICD-10-CM | POA: Diagnosis not present

## 2023-10-07 DIAGNOSIS — Z794 Long term (current) use of insulin: Secondary | ICD-10-CM

## 2023-10-07 DIAGNOSIS — I5032 Chronic diastolic (congestive) heart failure: Secondary | ICD-10-CM | POA: Diagnosis present

## 2023-10-07 DIAGNOSIS — I3481 Nonrheumatic mitral (valve) annulus calcification: Secondary | ICD-10-CM | POA: Diagnosis present

## 2023-10-07 DIAGNOSIS — E875 Hyperkalemia: Secondary | ICD-10-CM | POA: Diagnosis not present

## 2023-10-07 DIAGNOSIS — Z96642 Presence of left artificial hip joint: Secondary | ICD-10-CM | POA: Diagnosis present

## 2023-10-07 DIAGNOSIS — I440 Atrioventricular block, first degree: Secondary | ICD-10-CM | POA: Diagnosis present

## 2023-10-07 DIAGNOSIS — Z79899 Other long term (current) drug therapy: Secondary | ICD-10-CM

## 2023-10-07 DIAGNOSIS — Z7901 Long term (current) use of anticoagulants: Secondary | ICD-10-CM

## 2023-10-07 DIAGNOSIS — M25551 Pain in right hip: Secondary | ICD-10-CM | POA: Diagnosis not present

## 2023-10-07 DIAGNOSIS — I6350 Cerebral infarction due to unspecified occlusion or stenosis of unspecified cerebral artery: Secondary | ICD-10-CM | POA: Diagnosis not present

## 2023-10-07 DIAGNOSIS — Z6838 Body mass index (BMI) 38.0-38.9, adult: Secondary | ICD-10-CM

## 2023-10-07 DIAGNOSIS — I10 Essential (primary) hypertension: Secondary | ICD-10-CM | POA: Diagnosis present

## 2023-10-07 DIAGNOSIS — Z96643 Presence of artificial hip joint, bilateral: Secondary | ICD-10-CM | POA: Diagnosis not present

## 2023-10-07 DIAGNOSIS — E1122 Type 2 diabetes mellitus with diabetic chronic kidney disease: Secondary | ICD-10-CM | POA: Diagnosis not present

## 2023-10-07 DIAGNOSIS — S72091D Other fracture of head and neck of right femur, subsequent encounter for closed fracture with routine healing: Secondary | ICD-10-CM | POA: Diagnosis not present

## 2023-10-07 DIAGNOSIS — I130 Hypertensive heart and chronic kidney disease with heart failure and stage 1 through stage 4 chronic kidney disease, or unspecified chronic kidney disease: Secondary | ICD-10-CM | POA: Diagnosis present

## 2023-10-07 DIAGNOSIS — W19XXXA Unspecified fall, initial encounter: Secondary | ICD-10-CM | POA: Diagnosis not present

## 2023-10-07 DIAGNOSIS — J9811 Atelectasis: Secondary | ICD-10-CM | POA: Diagnosis not present

## 2023-10-07 DIAGNOSIS — Y92010 Kitchen of single-family (private) house as the place of occurrence of the external cause: Secondary | ICD-10-CM | POA: Diagnosis not present

## 2023-10-07 DIAGNOSIS — I69398 Other sequelae of cerebral infarction: Secondary | ICD-10-CM | POA: Diagnosis not present

## 2023-10-07 DIAGNOSIS — R2681 Unsteadiness on feet: Secondary | ICD-10-CM | POA: Diagnosis not present

## 2023-10-07 DIAGNOSIS — M1611 Unilateral primary osteoarthritis, right hip: Secondary | ICD-10-CM | POA: Diagnosis present

## 2023-10-07 DIAGNOSIS — Z951 Presence of aortocoronary bypass graft: Secondary | ICD-10-CM | POA: Diagnosis not present

## 2023-10-07 DIAGNOSIS — Y93G9 Activity, other involving cooking and grilling: Secondary | ICD-10-CM | POA: Diagnosis not present

## 2023-10-07 DIAGNOSIS — E1169 Type 2 diabetes mellitus with other specified complication: Secondary | ICD-10-CM | POA: Diagnosis not present

## 2023-10-07 DIAGNOSIS — R9089 Other abnormal findings on diagnostic imaging of central nervous system: Secondary | ICD-10-CM | POA: Diagnosis not present

## 2023-10-07 DIAGNOSIS — R42 Dizziness and giddiness: Secondary | ICD-10-CM | POA: Diagnosis not present

## 2023-10-07 DIAGNOSIS — S72001A Fracture of unspecified part of neck of right femur, initial encounter for closed fracture: Secondary | ICD-10-CM | POA: Diagnosis not present

## 2023-10-07 DIAGNOSIS — M1990 Unspecified osteoarthritis, unspecified site: Secondary | ICD-10-CM | POA: Diagnosis not present

## 2023-10-07 DIAGNOSIS — Z7401 Bed confinement status: Secondary | ICD-10-CM | POA: Diagnosis not present

## 2023-10-07 DIAGNOSIS — Z8249 Family history of ischemic heart disease and other diseases of the circulatory system: Secondary | ICD-10-CM

## 2023-10-07 DIAGNOSIS — R278 Other lack of coordination: Secondary | ICD-10-CM | POA: Diagnosis not present

## 2023-10-07 DIAGNOSIS — Z96652 Presence of left artificial knee joint: Secondary | ICD-10-CM | POA: Diagnosis present

## 2023-10-07 DIAGNOSIS — E119 Type 2 diabetes mellitus without complications: Secondary | ICD-10-CM

## 2023-10-07 DIAGNOSIS — Z9181 History of falling: Secondary | ICD-10-CM | POA: Diagnosis not present

## 2023-10-07 DIAGNOSIS — S72144D Nondisplaced intertrochanteric fracture of right femur, subsequent encounter for closed fracture with routine healing: Secondary | ICD-10-CM | POA: Diagnosis not present

## 2023-10-07 DIAGNOSIS — N189 Chronic kidney disease, unspecified: Secondary | ICD-10-CM | POA: Diagnosis not present

## 2023-10-07 DIAGNOSIS — R41841 Cognitive communication deficit: Secondary | ICD-10-CM | POA: Diagnosis not present

## 2023-10-07 DIAGNOSIS — Z885 Allergy status to narcotic agent status: Secondary | ICD-10-CM

## 2023-10-07 DIAGNOSIS — M810 Age-related osteoporosis without current pathological fracture: Secondary | ICD-10-CM | POA: Diagnosis present

## 2023-10-07 DIAGNOSIS — R54 Age-related physical debility: Secondary | ICD-10-CM | POA: Diagnosis present

## 2023-10-07 DIAGNOSIS — W010XXA Fall on same level from slipping, tripping and stumbling without subsequent striking against object, initial encounter: Secondary | ICD-10-CM | POA: Diagnosis present

## 2023-10-07 DIAGNOSIS — E785 Hyperlipidemia, unspecified: Secondary | ICD-10-CM | POA: Diagnosis not present

## 2023-10-07 DIAGNOSIS — Z8619 Personal history of other infectious and parasitic diseases: Secondary | ICD-10-CM

## 2023-10-07 DIAGNOSIS — R531 Weakness: Secondary | ICD-10-CM | POA: Diagnosis not present

## 2023-10-07 DIAGNOSIS — R0902 Hypoxemia: Secondary | ICD-10-CM | POA: Diagnosis not present

## 2023-10-07 DIAGNOSIS — Z880 Allergy status to penicillin: Secondary | ICD-10-CM

## 2023-10-07 DIAGNOSIS — I700 Atherosclerosis of aorta: Secondary | ICD-10-CM | POA: Diagnosis not present

## 2023-10-07 DIAGNOSIS — Z471 Aftercare following joint replacement surgery: Secondary | ICD-10-CM | POA: Diagnosis not present

## 2023-10-07 DIAGNOSIS — I129 Hypertensive chronic kidney disease with stage 1 through stage 4 chronic kidney disease, or unspecified chronic kidney disease: Secondary | ICD-10-CM | POA: Diagnosis not present

## 2023-10-07 DIAGNOSIS — Z888 Allergy status to other drugs, medicaments and biological substances status: Secondary | ICD-10-CM

## 2023-10-07 DIAGNOSIS — I480 Paroxysmal atrial fibrillation: Secondary | ICD-10-CM | POA: Diagnosis not present

## 2023-10-07 DIAGNOSIS — M1711 Unilateral primary osteoarthritis, right knee: Secondary | ICD-10-CM | POA: Diagnosis not present

## 2023-10-07 DIAGNOSIS — I2510 Atherosclerotic heart disease of native coronary artery without angina pectoris: Secondary | ICD-10-CM | POA: Diagnosis not present

## 2023-10-07 DIAGNOSIS — Z741 Need for assistance with personal care: Secondary | ICD-10-CM | POA: Diagnosis not present

## 2023-10-07 DIAGNOSIS — G8918 Other acute postprocedural pain: Secondary | ICD-10-CM | POA: Diagnosis not present

## 2023-10-07 DIAGNOSIS — Z96641 Presence of right artificial hip joint: Secondary | ICD-10-CM | POA: Diagnosis not present

## 2023-10-07 LAB — CBC WITH DIFFERENTIAL/PLATELET
Abs Immature Granulocytes: 0.02 10*3/uL (ref 0.00–0.07)
Basophils Absolute: 0 10*3/uL (ref 0.0–0.1)
Basophils Relative: 0 %
Eosinophils Absolute: 0.2 10*3/uL (ref 0.0–0.5)
Eosinophils Relative: 3 %
HCT: 39.1 % (ref 36.0–46.0)
Hemoglobin: 12.7 g/dL (ref 12.0–15.0)
Immature Granulocytes: 0 %
Lymphocytes Relative: 41 %
Lymphs Abs: 2.7 10*3/uL (ref 0.7–4.0)
MCH: 30.3 pg (ref 26.0–34.0)
MCHC: 32.5 g/dL (ref 30.0–36.0)
MCV: 93.3 fL (ref 80.0–100.0)
Monocytes Absolute: 0.6 10*3/uL (ref 0.1–1.0)
Monocytes Relative: 8 %
Neutro Abs: 3.2 10*3/uL (ref 1.7–7.7)
Neutrophils Relative %: 48 %
Platelets: 156 10*3/uL (ref 150–400)
RBC: 4.19 MIL/uL (ref 3.87–5.11)
RDW: 12.2 % (ref 11.5–15.5)
WBC: 6.7 10*3/uL (ref 4.0–10.5)
nRBC: 0 % (ref 0.0–0.2)

## 2023-10-07 LAB — PROTIME-INR
INR: 2.4 — ABNORMAL HIGH (ref 0.8–1.2)
Prothrombin Time: 26.4 s — ABNORMAL HIGH (ref 11.4–15.2)

## 2023-10-07 LAB — GLUCOSE, CAPILLARY: Glucose-Capillary: 154 mg/dL — ABNORMAL HIGH (ref 70–99)

## 2023-10-07 LAB — BASIC METABOLIC PANEL
Anion gap: 7 (ref 5–15)
BUN: 33 mg/dL — ABNORMAL HIGH (ref 8–23)
CO2: 22 mmol/L (ref 22–32)
Calcium: 9.2 mg/dL (ref 8.9–10.3)
Chloride: 109 mmol/L (ref 98–111)
Creatinine, Ser: 1.05 mg/dL — ABNORMAL HIGH (ref 0.44–1.00)
GFR, Estimated: 51 mL/min — ABNORMAL LOW (ref >60)
Glucose, Bld: 116 mg/dL — ABNORMAL HIGH (ref 70–99)
Potassium: 4.6 mmol/L (ref 3.5–5.1)
Sodium: 138 mmol/L (ref 135–145)

## 2023-10-07 MED ORDER — INSULIN ASPART 100 UNIT/ML IJ SOLN
0.0000 [IU] | Freq: Every day | INTRAMUSCULAR | Status: DC
  Administered 2023-10-08: 2 [IU] via SUBCUTANEOUS
  Administered 2023-10-10: 3 [IU] via SUBCUTANEOUS
  Filled 2023-10-07: qty 0.05

## 2023-10-07 MED ORDER — INSULIN ASPART 100 UNIT/ML IJ SOLN
0.0000 [IU] | Freq: Three times a day (TID) | INTRAMUSCULAR | Status: DC
  Administered 2023-10-08: 2 [IU] via SUBCUTANEOUS
  Administered 2023-10-08 (×2): 3 [IU] via SUBCUTANEOUS
  Administered 2023-10-10: 2 [IU] via SUBCUTANEOUS
  Administered 2023-10-10: 5 [IU] via SUBCUTANEOUS
  Administered 2023-10-10: 3 [IU] via SUBCUTANEOUS
  Administered 2023-10-11 – 2023-10-12 (×4): 2 [IU] via SUBCUTANEOUS
  Administered 2023-10-12: 1 [IU] via SUBCUTANEOUS
  Administered 2023-10-12: 2 [IU] via SUBCUTANEOUS
  Administered 2023-10-13: 1 [IU] via SUBCUTANEOUS
  Administered 2023-10-13: 2 [IU] via SUBCUTANEOUS
  Filled 2023-10-07: qty 0.09

## 2023-10-07 MED ORDER — SENNOSIDES-DOCUSATE SODIUM 8.6-50 MG PO TABS: 1.0000 | ORAL_TABLET | Freq: Every evening | ORAL | Status: DC | PRN

## 2023-10-07 MED ORDER — INSULIN GLARGINE-YFGN 100 UNIT/ML ~~LOC~~ SOLN: 35.0000 [IU] | Freq: Two times a day (BID) | SUBCUTANEOUS | Status: DC

## 2023-10-07 MED ORDER — ROSUVASTATIN CALCIUM 10 MG PO TABS
10.0000 mg | ORAL_TABLET | Freq: Every day | ORAL | Status: DC
  Administered 2023-10-07 – 2023-10-12 (×6): 10 mg via ORAL
  Filled 2023-10-07 (×6): qty 1

## 2023-10-07 MED ORDER — INSULIN GLARGINE-YFGN 100 UNIT/ML ~~LOC~~ SOLN
30.0000 [IU] | Freq: Two times a day (BID) | SUBCUTANEOUS | Status: DC
  Administered 2023-10-07 – 2023-10-13 (×12): 30 [IU] via SUBCUTANEOUS
  Filled 2023-10-07 (×14): qty 0.3

## 2023-10-07 MED ORDER — SPIRONOLACTONE 12.5 MG HALF TABLET
12.5000 mg | ORAL_TABLET | Freq: Every day | ORAL | Status: DC
  Filled 2023-10-07: qty 1

## 2023-10-07 MED ORDER — FENTANYL CITRATE PF 50 MCG/ML IJ SOSY
25.0000 ug | PREFILLED_SYRINGE | Freq: Once | INTRAMUSCULAR | Status: AC
  Administered 2023-10-07: 25 ug via INTRAVENOUS
  Filled 2023-10-07: qty 1

## 2023-10-07 MED ORDER — ONDANSETRON HCL 4 MG/2ML IJ SOLN
4.0000 mg | Freq: Four times a day (QID) | INTRAMUSCULAR | Status: DC | PRN
Start: 2023-10-07 — End: 2023-10-07

## 2023-10-07 MED ORDER — CARVEDILOL 6.25 MG PO TABS
6.2500 mg | ORAL_TABLET | Freq: Two times a day (BID) | ORAL | Status: DC
  Administered 2023-10-07 – 2023-10-13 (×12): 6.25 mg via ORAL
  Filled 2023-10-07 (×2): qty 1
  Filled 2023-10-07: qty 2
  Filled 2023-10-07 (×3): qty 1
  Filled 2023-10-07: qty 2
  Filled 2023-10-07: qty 1
  Filled 2023-10-07: qty 2
  Filled 2023-10-07: qty 1
  Filled 2023-10-07: qty 2
  Filled 2023-10-07: qty 1

## 2023-10-07 MED ORDER — OXYCODONE HCL 5 MG PO TABS
5.0000 mg | ORAL_TABLET | ORAL | Status: DC | PRN
  Administered 2023-10-07 – 2023-10-08 (×3): 5 mg via ORAL
  Filled 2023-10-07 (×3): qty 1

## 2023-10-07 MED ORDER — ONDANSETRON HCL 4 MG/2ML IJ SOLN
4.0000 mg | Freq: Once | INTRAMUSCULAR | Status: AC
  Administered 2023-10-07: 4 mg via INTRAVENOUS
  Filled 2023-10-07: qty 2

## 2023-10-07 MED ORDER — METHOCARBAMOL 1000 MG/10ML IJ SOLN: 500.0000 mg | Freq: Four times a day (QID) | INTRAVENOUS | Status: DC | PRN

## 2023-10-07 MED ORDER — INSULIN ASPART 100 UNIT/ML IJ SOLN
3.0000 [IU] | Freq: Three times a day (TID) | INTRAMUSCULAR | Status: DC
  Administered 2023-10-08 – 2023-10-13 (×8): 3 [IU] via SUBCUTANEOUS
  Filled 2023-10-07: qty 0.03

## 2023-10-07 MED ORDER — MORPHINE SULFATE (PF) 4 MG/ML IV SOLN
4.0000 mg | Freq: Once | INTRAVENOUS | Status: AC
  Administered 2023-10-07: 4 mg via INTRAVENOUS
  Filled 2023-10-07: qty 1

## 2023-10-07 MED ORDER — METHOCARBAMOL 500 MG PO TABS
500.0000 mg | ORAL_TABLET | Freq: Four times a day (QID) | ORAL | Status: DC | PRN
  Administered 2023-10-07 – 2023-10-13 (×5): 500 mg via ORAL
  Filled 2023-10-07 (×5): qty 1

## 2023-10-07 MED ORDER — ONDANSETRON HCL 4 MG/2ML IJ SOLN
4.0000 mg | Freq: Four times a day (QID) | INTRAMUSCULAR | Status: DC | PRN
  Administered 2023-10-08: 4 mg via INTRAVENOUS
  Filled 2023-10-07: qty 2

## 2023-10-07 MED ORDER — ISOSORBIDE MONONITRATE ER 30 MG PO TB24
15.0000 mg | ORAL_TABLET | Freq: Every day | ORAL | Status: DC
  Administered 2023-10-08 – 2023-10-13 (×5): 15 mg via ORAL
  Filled 2023-10-07 (×5): qty 1

## 2023-10-07 MED ORDER — FENTANYL CITRATE PF 50 MCG/ML IJ SOSY: 12.5000 ug | PREFILLED_SYRINGE | INTRAMUSCULAR | Status: DC | PRN

## 2023-10-07 NOTE — ED Notes (Signed)
Pt placed on 2L O2 due to O2 dropping to 89% after pain medication

## 2023-10-07 NOTE — ED Notes (Signed)
ED TO INPATIENT HANDOFF REPORT  ED Nurse Name and Phone #: Ileta Ofarrell RN  S Name/Age/Gender [name] 89 y.o. female Room/Bed: WA15/WA15  Code Status   Code Status: Prior  Home/SNF/Other Home Patient oriented to: self, place, time, and situation Is this baseline? Yes   Triage Complete: Triage complete  Chief Complaint Closed right hip fracture, initial encounter (HCC) [S72.001A]  Triage Note BIB EMS from home for mechanical fall due to balance issues, pt fell and fell onto right side and has deformity and rotating of right hip. Pt is on blood thinners. Pt denies hitting head.    Allergies Allergies  Allergen Reactions   Codeine Nausea And Vomiting   Penicillins Swelling and Other (See Comments)    Did it involve swelling of the face/tongue/throat, SOB, or low BP? Yes Did it involve sudden or severe rash/hives, skin peeling, or any reaction on the inside of your mouth or nose? Unknown Did you need to seek medical attention at a hospital or doctor's office? Unknown When did it last happen?  Unknown If all above answers are "NO", may proceed with cephalosporin use.  Swelling of tongue   Acetaminophen-Codeine     Other reaction(s): Up set stomach   Atenolol Other (See Comments)   Atorvastatin     Muscle soreness   Chloramphenicol Other (See Comments)   Dapagliflozin     Other reaction(s): yeast infections   Empagliflozin     Other reaction(s): yeast infections   Liraglutide     Other reaction(s): constipation, bloating, nausea   Metformin Hcl Other (See Comments)   Metoprolol Other (See Comments)   Zetia [Ezetimibe] Other (See Comments)   Sertraline Palpitations    Level of Care/Admitting Diagnosis ED Disposition     ED Disposition  Admit   Condition  --   Comment  [facility] [[id number]]  Level of Care: Med-Surg [16]  May admit patient to [facility] or [facility] if equivalent level of care is available:: No   Covid Evaluation: Asymptomatic - no recent exposure (last 10 days) testing not required  Diagnosis: Closed right hip fracture, initial encounter [facility]) [[id number]]  Admitting Physician: Briscoe Deutscher [[id number]]  Attending Physician: Briscoe Deutscher [[id number]]  Certification:: I certify this patient will need inpatient services for at least 2 midnights  Expected Medical Readiness: 10/10/2023          B Medical/Surgery History Past Medical History:  Diagnosis Date   Anginal pain (HCC)    "saw doctor-think it was esophagus spasm"   Arthritis    Atrial flutter (HCC) 11/02/2013   Cataract    left eye   Coronary artery disease    Diabetes mellitus without complication (HCC)    type 2   Essential hypertension, benign 11/02/2013   H/O jaundice    as child   Headache 2015   complicated migraine x2    Hearing trouble    Heart disease    Hepatitis    hx of hepatitis A as child   High cholesterol    under control   Long term (current) use of anticoagulants 11/02/2013   Pradaxa    Measles    as child   Mouth dryness    Mumps    as teenager   Numbness and tingling    lips   Osteoporosis    "unsure-maybe in knees"   Psoriasis    Psoriatic arthritis (HCC)    Stroke (HCC) 2012   Vertigo  being monitored, occasional   Past Surgical History:  Procedure Laterality Date   APPENDECTOMY  as child   CARDIAC CATHETERIZATION  ~2006   clear   CORONARY ARTERY BYPASS GRAFT  02/16/2005   LUMBAR DISC SURGERY  09/2004   L1-L5   TONSILLECTOMY  as child   with adenoids   TOTAL HIP ARTHROPLASTY Left 04/19/2019   Procedure: TOTAL HIP ARTHROPLASTY ANTERIOR APPROACH;  Surgeon: Ollen Gross, MD;  Location: [facility];  Service: Orthopedics;  Laterality: Left;   TOTAL KNEE ARTHROPLASTY Left 09/11/2015   Procedure: LEFT TOTAL KNEE ARTHROPLASTY;  Surgeon: Ollen Gross, MD;  Location: [facility];  Service: Orthopedics;  Laterality: Left;     A IV Location/Drains/Wounds Patient Lines/Drains/Airways  Status     Active Line/Drains/Airways     Name Placement date Placement time Site Days   Peripheral IV 10/07/23 20 G Anterior;Distal;Left Forearm 10/07/23  1917  Forearm  less than 1            Intake/Output Last 24 hours No intake or output data in the 24 hours ending 10/07/23 2045  Labs/Imaging Results for orders placed or performed during the hospital encounter of 10/07/23 (from the past 48 hour(s))  CBC with Differential     Status: None   Collection Time: 10/07/23  7:17 PM  Result Value Ref Range   WBC 6.7 4.0 - 10.5 K/uL   RBC 4.19 3.87 - 5.11 MIL/uL   Hemoglobin 12.7 12.0 - 15.0 g/dL   HCT 16.1 09.6 - 04.5 %   MCV 93.3 80.0 - 100.0 fL   MCH 30.3 26.0 - 34.0 pg   MCHC 32.5 30.0 - 36.0 g/dL   RDW 40.9 81.1 - 91.4 %   Platelets 156 150 - 400 K/uL   nRBC 0.0 0.0 - 0.2 %   Neutrophils Relative % 48 %   Neutro Abs 3.2 1.7 - 7.7 K/uL   Lymphocytes Relative 41 %   Lymphs Abs 2.7 0.7 - 4.0 K/uL   Monocytes Relative 8 %   Monocytes Absolute 0.6 0.1 - 1.0 K/uL   Eosinophils Relative 3 %   Eosinophils Absolute 0.2 0.0 - 0.5 K/uL   Basophils Relative 0 %   Basophils Absolute 0.0 0.0 - 0.1 K/uL   Immature Granulocytes 0 %   Abs Immature Granulocytes 0.02 0.00 - 0.07 K/uL    Comment: Performed at [facility], [address] [place], [place] [address]  Basic metabolic panel     Status: Abnormal   Collection Time: 10/07/23  7:17 PM  Result Value Ref Range   Sodium 138 135 - 145 mmol/L   Potassium 4.6 3.5 - 5.1 mmol/L   Chloride 109 98 - 111 mmol/L   CO2 22 22 - 32 mmol/L   Glucose, Bld 116 (H) 70 - 99 mg/dL    Comment: Glucose reference range applies only to samples taken after fasting for at least 8 hours.   BUN 33 (H) 8 - 23 mg/dL   Creatinine, Ser 6.21 (H) 0.44 - 1.00 mg/dL   Calcium 9.2 8.9 - 30.8 mg/dL   GFR, Estimated 51 (L) >60 mL/min    Comment: (NOTE) Calculated using the CKD-EPI Creatinine Equation (2021)    Anion gap 7 5 - 15     Comment: Performed at [facility], [address] [place], [place] [address]  Protime-INR     Status: Abnormal   Collection Time: 10/07/23  7:17 PM  Result Value Ref Range   Prothrombin  Time 26.4 (H) 11.4 - 15.2 seconds   INR 2.4 (H) 0.8 - 1.2    Comment: (NOTE) INR goal varies based on device and disease states. Performed at [facility], [address], [place], [place] [address]    CT HEAD WO CONTRAST ( )  Result Date: 10/07/2023 CLINICAL DATA:  Mental status change, unknown cause dizziness, fall; Neck trauma (Age >= 65y) EXAM: CT HEAD WITHOUT CONTRAST CT CERVICAL SPINE WITHOUT CONTRAST TECHNIQUE: Multidetector CT imaging of the head and cervical spine was performed following the standard protocol without intravenous contrast. Multiplanar CT image reconstructions of the cervical spine were also generated. RADIATION DOSE REDUCTION: This exam was performed according to the departmental dose-optimization program which includes automated exposure control, adjustment of the mA and/or kV according to patient size and/or use of iterative reconstruction technique. COMPARISON:  None Available. FINDINGS: CT HEAD FINDINGS Brain: Normal anatomic configuration. Parenchymal volume loss is commensurate with the patient's age. Stable mild periventricular white matter changes are present likely reflecting the sequela of small vessel ischemia. Remote left cerebellar and right thalamic infarcts again noted. No abnormal intra or extra-axial mass lesion or fluid collection. No abnormal mass effect or midline shift. No evidence of acute intracranial hemorrhage or infarct. Ventricular size is normal. Cerebellum unremarkable. Vascular: No asymmetric hyperdense vasculature at the skull base. Skull: Intact Sinuses/Orbits: Paranasal sinuses are clear. Orbits are unremarkable. Other: Mastoid air cells and middle ear cavities are clear. CT CERVICAL SPINE FINDINGS Alignment: Normal.  Skull base and vertebrae: Craniocervical alignment is normal. The atlantodental interval is not widened. No acute fracture of the cervical spine. Vertebral body height is preserved. Ankylosis of the facet joints of C3-4 noted bilaterally. Soft tissues and spinal canal: No prevertebral fluid or swelling. No visible canal hematoma. Disc levels: Intervertebral disc space narrowing and endplate remodeling at C5-C7 is present in keeping with changes of moderate degenerative disc disease. Prevertebral soft tissues are not thickened on sagittal reformats. Spinal canal is widely patent. Uncovertebral arthrosis results in moderate to severe right neuroforaminal narrowing at C5-6 and C6-7. Upper chest: Negative. Other: None IMPRESSION: 1. No acute intracranial abnormality. No calvarial fracture. 2. Stable senescent change and remote left cerebellar and right thalamic infarcts. 3. No acute fracture or listhesis of the cervical spine. 4. Multilevel degenerative disc and degenerative joint disease resulting in moderate to severe right neuroforaminal narrowing at C5-6 and C6-7. Electronically Signed   By: Helyn Numbers M.D.   On: 10/07/2023 20:09   CT Cervical Spine Wo Contrast  Result Date: 10/07/2023 CLINICAL DATA:  Mental status change, unknown cause dizziness, fall; Neck trauma (Age >= 65y) EXAM: CT HEAD WITHOUT CONTRAST CT CERVICAL SPINE WITHOUT CONTRAST TECHNIQUE: Multidetector CT imaging of the head and cervical spine was performed following the standard protocol without intravenous contrast. Multiplanar CT image reconstructions of the cervical spine were also generated. RADIATION DOSE REDUCTION: This exam was performed according to the departmental dose-optimization program which includes automated exposure control, adjustment of the mA and/or kV according to patient size and/or use of iterative reconstruction technique. COMPARISON:  None Available. FINDINGS: CT HEAD FINDINGS Brain: Normal anatomic configuration.  Parenchymal volume loss is commensurate with the patient's age. Stable mild periventricular white matter changes are present likely reflecting the sequela of small vessel ischemia. Remote left cerebellar and right thalamic infarcts again noted. No abnormal intra or extra-axial mass lesion or fluid collection. No abnormal mass effect or midline shift. No evidence of acute intracranial hemorrhage or infarct. Ventricular size is  normal. Cerebellum unremarkable. Vascular: No asymmetric hyperdense vasculature at the skull base. Skull: Intact Sinuses/Orbits: Paranasal sinuses are clear. Orbits are unremarkable. Other: Mastoid air cells and middle ear cavities are clear. CT CERVICAL SPINE FINDINGS Alignment: Normal. Skull base and vertebrae: Craniocervical alignment is normal. The atlantodental interval is not widened. No acute fracture of the cervical spine. Vertebral body height is preserved. Ankylosis of the facet joints of C3-4 noted bilaterally. Soft tissues and spinal canal: No prevertebral fluid or swelling. No visible canal hematoma. Disc levels: Intervertebral disc space narrowing and endplate remodeling at C5-C7 is present in keeping with changes of moderate degenerative disc disease. Prevertebral soft tissues are not thickened on sagittal reformats. Spinal canal is widely patent. Uncovertebral arthrosis results in moderate to severe right neuroforaminal narrowing at C5-6 and C6-7. Upper chest: Negative. Other: None IMPRESSION: 1. No acute intracranial abnormality. No calvarial fracture. 2. Stable senescent change and remote left cerebellar and right thalamic infarcts. 3. No acute fracture or listhesis of the cervical spine. 4. Multilevel degenerative disc and degenerative joint disease resulting in moderate to severe right neuroforaminal narrowing at C5-6 and C6-7. Electronically Signed   By: Helyn Numbers M.D.   On: 10/07/2023 20:09   DG Chest 1 View  Result Date: 10/07/2023 CLINICAL DATA:  Hip pain with  fall EXAM: CHEST  1 VIEW COMPARISON:  04/17/2019 FINDINGS: Post sternotomy changes. Low lung volumes. No acute airspace disease or pleural effusion. Aortic atherosclerosis. No pneumothorax. IMPRESSION: Low lung volumes. Electronically Signed   By: Jasmine Pang M.D.   On: 10/07/2023 19:51   DG Hip Unilat W or Wo Pelvis 2-3 Views Right  Result Date: 10/07/2023 CLINICAL DATA:  Right hip pain after a fall. EXAM: DG HIP (WITH OR WITHOUT PELVIS) 2-3V RIGHT COMPARISON:  None Available. FINDINGS: Suspicion of subcapital femoral neck fracture on the right. Limited detail because of patient's size. Consider CT for confirmation. No other pelvic fracture. Previous hip replacement on the left. IMPRESSION: Suspicion of subcapital femoral neck fracture on the right. Consider CT for confirmation. Electronically Signed   By: Paulina Fusi M.D.   On: 10/07/2023 19:50    Pending Labs Unresulted Labs (From admission, onward)    None       Vitals/Pain Today's Vitals   10/07/23 2030 10/07/23 2031 10/07/23 2032 10/07/23 2039  BP: 133/64     Pulse: 83 84 81   Resp: 13 14 14    Temp:      TempSrc:      SpO2: 92% (!) 89% 98%   Weight:      Height:      PainSc:    10-Worst pain ever    Isolation Precautions No active isolations  Medications Medications  morphine (PF) 4 MG/ML injection 4 mg (4 mg Intravenous Given 10/07/23 1917)  ondansetron (ZOFRAN) injection 4 mg (4 mg Intravenous Given 10/07/23 1917)  fentaNYL (SUBLIMAZE) injection 25 mcg (25 mcg Intravenous Given 10/07/23 2040)    Mobility non-ambulatory     Focused Assessments Cardiac Assessment Handoff:    Lab Results  Component Value Date   CKTOTAL 47 02/04/2011   CKMB 1.6 02/04/2011   TROPONINI <0.03 04/18/2019   No results found for: "DDIMER" Does the Patient currently have chest pain? No    R Recommendations: See Admitting Provider Note  Report given to:   Additional Notes: pt from home, came to the ed for fall did scan and she  does have hip fx, not ambulatory as of now, Alert x4, with  o2 at 2l per [place] for support.

## 2023-10-07 NOTE — H&P (Signed)
History and Physical    [name] ZOX:[id number] DOB: 1934-02-19 DOA: 10/07/2023  PCP: Emilio Aspen, MD   Patient coming from: Home   Chief Complaint: Fall, right hip pain and deformity   HPI: [name] is a pleasant 89 y.o. female with medical history significant for hypertension, hyperlipidemia, type 2 diabetes mellitus, CAD status post CABG, atrial fibrillation on Pradaxa, history of CVA, rheumatoid arthritis, and CKD 3A who presents with right hip pain and deformity after a fall at home.  Patient states that she had been in her usual state of health and was preparing some food at home when she tripped and fell onto her right side.  She has been experiencing severe hip pain.  She was not sure if she hit her head but denies losing consciousness and denies head or neck pain.   Patient denies any recent fevers or chills.  She has had occasional episodes of a heavy sensation in her chest that has sometimes occurred after meals or with exertion, and this was evaluated with NM PET in late August.  There was a small area of apical lateral ischemia on this study and she has not yet followed up with cardiology to discuss.  ED Course: Upon arrival to the ED, patient is found to be afebrile and saturating well on room air initially with normal heart rate and stable blood pressure.  EKG demonstrates sinus rhythm with PAC, first-degree AV nodal block, and chronic LBBB.  No acute findings noted on head CT or CT of the cervical spine.  Plain radiographs are suspicious for subcapital right hip fracture.  Labs are most notable for creatinine 1.05 and INR 2.4.  Orthopedic surgery (Dr. [name]) was consulted by the ED physician and the patient was treated with fentanyl, morphine, and Zofran.  Review of Systems:  All other systems reviewed and apart from HPI, are negative.  Past Medical History:  Diagnosis Date   Anginal pain (HCC)    "saw doctor-think it was esophagus spasm"    Arthritis    Atrial flutter (HCC) 11/02/2013   Cataract    left eye   Coronary artery disease    Diabetes mellitus without complication (HCC)    type 2   Essential hypertension, benign 11/02/2013   H/O jaundice    as child   Headache 2015   complicated migraine x2    Hearing trouble    Heart disease    Hepatitis    hx of hepatitis A as child   High cholesterol    under control   Long term (current) use of anticoagulants 11/02/2013   Pradaxa    Measles    as child   Mouth dryness    Mumps    as teenager   Numbness and tingling    lips   Osteoporosis    "unsure-maybe in knees"   Psoriasis    Psoriatic arthritis (HCC)    Stroke (HCC) 2012   Vertigo    being monitored, occasional    Past Surgical History:  Procedure Laterality Date   APPENDECTOMY  as child   CARDIAC CATHETERIZATION  ~2006   clear   CORONARY ARTERY BYPASS GRAFT  02/16/2005   LUMBAR DISC SURGERY  09/2004   L1-L5   TONSILLECTOMY  as child   with adenoids   TOTAL HIP ARTHROPLASTY Left 04/19/2019   Procedure: TOTAL HIP ARTHROPLASTY ANTERIOR APPROACH;  Surgeon: Ollen Gross, MD;  Location: [facility];  Service: Orthopedics;  Laterality: Left;   TOTAL  KNEE ARTHROPLASTY Left 09/11/2015   Procedure: LEFT TOTAL KNEE ARTHROPLASTY;  Surgeon: Ollen Gross, MD;  Location: [facility];  Service: Orthopedics;  Laterality: Left;    Social History:   reports that she has never smoked. She has never used smokeless tobacco. She reports that she does not drink alcohol and does not use drugs.  Allergies  Allergen Reactions   Codeine Nausea And Vomiting   Penicillins Swelling and Other (See Comments)    Did it involve swelling of the face/tongue/throat, SOB, or low BP? Yes Did it involve sudden or severe rash/hives, skin peeling, or any reaction on the inside of your mouth or nose? Unknown Did you need to seek medical attention at a hospital or doctor's office? Unknown When did it last happen?  Unknown If all above answers  are "NO", may proceed with cephalosporin use.  Swelling of tongue   Acetaminophen-Codeine     Other reaction(s): Up set stomach   Atenolol Other (See Comments)   Atorvastatin     Muscle soreness   Chloramphenicol Other (See Comments)   Dapagliflozin     Other reaction(s): yeast infections   Empagliflozin     Other reaction(s): yeast infections   Liraglutide     Other reaction(s): constipation, bloating, nausea   Metformin Hcl Other (See Comments)   Metoprolol Other (See Comments)   Zetia [Ezetimibe] Other (See Comments)   Sertraline Palpitations    Family History  Problem Relation Age of Onset   Heart failure Mother    Neuromuscular disorder Father      Prior to Admission medications   Medication Sig Start Date End Date Taking? Authorizing Provider  Accu-Chek FastClix Lancets MISC .    [provider]  acetaminophen (TYLENOL) 650 MG CR tablet Take 650 mg by mouth as needed.    [provider]  B-D UF III MINI PEN NEEDLES 31G X 5 MM MISC SMARTSIG:Pre-Filled Pen Syringe SUB-Q As Directed 12/03/21   [provider]  calcium elemental as carbonate (BARIATRIC TUMS ULTRA) 400 MG chewable tablet Chew 1,000 mg by mouth every evening.    [provider]  carvedilol (COREG) 6.25 MG tablet Take 1 tablet (6.25 mg total) by mouth 2 (two) times daily. 02/07/23   Christell Constant, MD  cholecalciferol (VITAMIN D) 1000 units tablet Take 2,000 Units by mouth daily.    [provider]  clobetasol cream (TEMOVATE) 0.05 % Apply 1 application topically 2 (two) times daily as needed (skin irritation).    [provider]  clonazePAM (KLONOPIN) 0.5 MG tablet Take 0.5 mg by mouth at bedtime. Pt takes 0.5mg  at bedtime and then a second dose .25mg  tablet if she wakes up in the middle of the night . 06/16/12   [provider]  Continuous Blood Gluc Sensor (FREESTYLE LIBRE 2 SENSOR) MISC as directed. 07/18/22   [provider]   docusate sodium (COLACE) 100 MG capsule as needed.    [provider]  ENBREL 50 MG/ML injection Inject 50 mg into the skin once a week.  04/07/19   [provider]  glucose blood (ACCU-CHEK GUIDE) test strip USE AS DIRECTED THREE TIMES DAILY TO  CHECK  BLOOD  GLUCOSE    [provider]  HUMALOG MIX 50/50 KWIKPEN (50-50) 100 UNIT/ML Kwikpen Inject 35 Units into the skin 2 (two) times daily. 56 Units in AM; 10 Units in the afternoon. 06/16/20   [provider]  Insulin Lispro Prot & Lispro (HUMALOG 50/50 MIX) (50-50) 100  UNIT/ML Kwikpen Inject 34 Units into the skin before breakfast, 6 units before lunch and 24 units before evening meal. 08/20/22   Talmage Coin, MD  Insulin Pen Needle (B-D UF III MINI PEN NEEDLES) 31G X 5 MM MISC See admin instructions.    [provider]  isosorbide mononitrate (IMDUR) 30 MG 24 hr tablet Take 0.5 tablets (15 mg total) by mouth daily. 06/25/22   Lyn Records, MD  loratadine (CLARITIN) 10 MG tablet daily at 6 (six) AM.    [provider]  nitroGLYCERIN (NITROSTAT) 0.4 MG SL tablet Place 1 tablet (0.4 mg total) under the tongue every 5 (five) minutes x 3 doses as needed for chest pain. 06/25/22   Lyn Records, MD  ondansetron (ZOFRAN) 4 MG tablet Take 1 tablet by mouth as needed. 05/17/22   [provider]  polyethylene glycol (MIRALAX / GLYCOLAX) 17 g packet Take 17 g by mouth daily.    [provider]  PRADAXA 150 MG CAPS Take 150 mg by mouth 2 (two) times daily.  07/18/12   [provider]  Probiotic Product (ALIGN PO) Take 1 capsule by mouth daily.    [provider]  ramipril (ALTACE) 5 MG capsule TAKE ONE CAPSULE BY MOUTH TWICE DAILY 12/09/22   Lyn Records, MD  rosuvastatin (CRESTOR) 10 MG tablet Take 10 mg by mouth at bedtime.     [provider]  spironolactone (ALDACTONE) 25 MG tablet Take 0.5 tablets (12.5 mg total) by mouth daily. 06/06/23   Meriam Sprague,  MD  vitamin B-12 (CYANOCOBALAMIN) 250 MCG tablet Take 250 mcg by mouth daily.    [provider]    Physical Exam: Vitals:   10/07/23 2015 10/07/23 2030 10/07/23 2031 10/07/23 2032  BP: (!) 133/118 133/64    Pulse: 78 83 84 81  Resp: 11 13 14 14   Temp:      TempSrc:      SpO2: 98% 92% (!) 89% 98%  Weight:      Height:        Constitutional: NAD, calm  Eyes: PERTLA, lids and conjunctivae normal ENMT: Mucous membranes are moist. Posterior pharynx clear of any exudate or lesions.   Neck: supple, no masses  Respiratory: no wheezing, no crackles. No accessory muscle use.  Cardiovascular: S1 & S2 heard, regular rate and rhythm. Mild b/l lower extremity edema.   Abdomen: No distension, no tenderness, soft. Bowel sounds active.  Musculoskeletal: no clubbing / cyanosis. Right hip tender, neurovascularly intact.   Skin: no significant rashes, lesions, ulcers. Warm, dry, well-perfused. Neurologic: CN 2-12 grossly intact. Moving all extremities. Alert and oriented.  Psychiatric: Pleasant. Cooperative.    Labs and Imaging on Admission: I have personally reviewed following labs and imaging studies  CBC: Recent Labs  Lab 10/07/23 1917  WBC 6.7  NEUTROABS 3.2  HGB 12.7  HCT 39.1  MCV 93.3  PLT 156   Basic Metabolic Panel: Recent Labs  Lab 10/07/23 1917  NA 138  K 4.6  CL 109  CO2 22  GLUCOSE 116*  BUN 33*  CREATININE 1.05*  CALCIUM 9.2   GFR: Estimated Creatinine Clearance: 37.4 mL/min (A) (by C-G formula based on SCr of 1.05 mg/dL (H)). Liver Function Tests: No results for input(s): "AST", "ALT", "ALKPHOS", "BILITOT", "PROT", "ALBUMIN" in the last 168 hours. No results for input(s): "LIPASE", "AMYLASE" in the last 168 hours. No results for input(s): "AMMONIA" in the last 168 hours. Coagulation Profile: Recent Labs  Lab  10/07/23 1917  INR 2.4*   Cardiac Enzymes: No results for input(s): "CKTOTAL", "CKMB", "CKMBINDEX", "TROPONINI" in the last 168  hours. BNP (last 3 results) No results for input(s): "PROBNP" in the last 8760 hours. HbA1C: No results for input(s): "HGBA1C" in the last 72 hours. CBG: No results for input(s): "GLUCAP" in the last 168 hours. Lipid Profile: No results for input(s): "CHOL", "HDL", "LDLCALC", "TRIG", "CHOLHDL", "LDLDIRECT" in the last 72 hours. Thyroid Function Tests: No results for input(s): "TSH", "T4TOTAL", "FREET4", "T3FREE", "THYROIDAB" in the last 72 hours. Anemia Panel: No results for input(s): "VITAMINB12", "FOLATE", "FERRITIN", "TIBC", "IRON", "RETICCTPCT" in the last 72 hours. Urine analysis:    Component Value Date/Time   COLORURINE YELLOW 04/18/2019 0520   APPEARANCEUR CLEAR 04/18/2019 0520   LABSPEC 1.020 04/18/2019 0520   PHURINE 5.0 04/18/2019 0520   GLUCOSEU NEGATIVE 04/18/2019 0520   HGBUR TRACE (A) 04/18/2019 0520   BILIRUBINUR NEGATIVE 04/18/2019 0520   KETONESUR TRACE (A) 04/18/2019 0520   PROTEINUR NEGATIVE 04/18/2019 0520   UROBILINOGEN 0.2 09/05/2015 1531   NITRITE NEGATIVE 04/18/2019 0520   LEUKOCYTESUR NEGATIVE 04/18/2019 0520   Sepsis Labs: @LABRCNTIP (procalcitonin:4,lacticidven:4) )No results found for this or any previous visit (from the past 240 hour(s)).   Radiological Exams on Admission: CT HEAD WO CONTRAST ( )  Result Date: 10/07/2023 CLINICAL DATA:  Mental status change, unknown cause dizziness, fall; Neck trauma (Age >= 65y) EXAM: CT HEAD WITHOUT CONTRAST CT CERVICAL SPINE WITHOUT CONTRAST TECHNIQUE: Multidetector CT imaging of the head and cervical spine was performed following the standard protocol without intravenous contrast. Multiplanar CT image reconstructions of the cervical spine were also generated. RADIATION DOSE REDUCTION: This exam was performed according to the departmental dose-optimization program which includes automated exposure control, adjustment of the mA and/or kV according to patient size and/or use of iterative reconstruction technique.  COMPARISON:  None Available. FINDINGS: CT HEAD FINDINGS Brain: Normal anatomic configuration. Parenchymal volume loss is commensurate with the patient's age. Stable mild periventricular white matter changes are present likely reflecting the sequela of small vessel ischemia. Remote left cerebellar and right thalamic infarcts again noted. No abnormal intra or extra-axial mass lesion or fluid collection. No abnormal mass effect or midline shift. No evidence of acute intracranial hemorrhage or infarct. Ventricular size is normal. Cerebellum unremarkable. Vascular: No asymmetric hyperdense vasculature at the skull base. Skull: Intact Sinuses/Orbits: Paranasal sinuses are clear. Orbits are unremarkable. Other: Mastoid air cells and middle ear cavities are clear. CT CERVICAL SPINE FINDINGS Alignment: Normal. Skull base and vertebrae: Craniocervical alignment is normal. The atlantodental interval is not widened. No acute fracture of the cervical spine. Vertebral body height is preserved. Ankylosis of the facet joints of C3-4 noted bilaterally. Soft tissues and spinal canal: No prevertebral fluid or swelling. No visible canal hematoma. Disc levels: Intervertebral disc space narrowing and endplate remodeling at C5-C7 is present in keeping with changes of moderate degenerative disc disease. Prevertebral soft tissues are not thickened on sagittal reformats. Spinal canal is widely patent. Uncovertebral arthrosis results in moderate to severe right neuroforaminal narrowing at C5-6 and C6-7. Upper chest: Negative. Other: None IMPRESSION: 1. No acute intracranial abnormality. No calvarial fracture. 2. Stable senescent change and remote left cerebellar and right thalamic infarcts. 3. No acute fracture or listhesis of the cervical spine. 4. Multilevel degenerative disc and degenerative joint disease resulting in moderate to severe right neuroforaminal narrowing at C5-6 and C6-7. Electronically Signed   By: Helyn Numbers M.D.   On:  10/07/2023 20:09  CT Cervical Spine Wo Contrast  Result Date: 10/07/2023 CLINICAL DATA:  Mental status change, unknown cause dizziness, fall; Neck trauma (Age >= 65y) EXAM: CT HEAD WITHOUT CONTRAST CT CERVICAL SPINE WITHOUT CONTRAST TECHNIQUE: Multidetector CT imaging of the head and cervical spine was performed following the standard protocol without intravenous contrast. Multiplanar CT image reconstructions of the cervical spine were also generated. RADIATION DOSE REDUCTION: This exam was performed according to the departmental dose-optimization program which includes automated exposure control, adjustment of the mA and/or kV according to patient size and/or use of iterative reconstruction technique. COMPARISON:  None Available. FINDINGS: CT HEAD FINDINGS Brain: Normal anatomic configuration. Parenchymal volume loss is commensurate with the patient's age. Stable mild periventricular white matter changes are present likely reflecting the sequela of small vessel ischemia. Remote left cerebellar and right thalamic infarcts again noted. No abnormal intra or extra-axial mass lesion or fluid collection. No abnormal mass effect or midline shift. No evidence of acute intracranial hemorrhage or infarct. Ventricular size is normal. Cerebellum unremarkable. Vascular: No asymmetric hyperdense vasculature at the skull base. Skull: Intact Sinuses/Orbits: Paranasal sinuses are clear. Orbits are unremarkable. Other: Mastoid air cells and middle ear cavities are clear. CT CERVICAL SPINE FINDINGS Alignment: Normal. Skull base and vertebrae: Craniocervical alignment is normal. The atlantodental interval is not widened. No acute fracture of the cervical spine. Vertebral body height is preserved. Ankylosis of the facet joints of C3-4 noted bilaterally. Soft tissues and spinal canal: No prevertebral fluid or swelling. No visible canal hematoma. Disc levels: Intervertebral disc space narrowing and endplate remodeling at C5-C7 is  present in keeping with changes of moderate degenerative disc disease. Prevertebral soft tissues are not thickened on sagittal reformats. Spinal canal is widely patent. Uncovertebral arthrosis results in moderate to severe right neuroforaminal narrowing at C5-6 and C6-7. Upper chest: Negative. Other: None IMPRESSION: 1. No acute intracranial abnormality. No calvarial fracture. 2. Stable senescent change and remote left cerebellar and right thalamic infarcts. 3. No acute fracture or listhesis of the cervical spine. 4. Multilevel degenerative disc and degenerative joint disease resulting in moderate to severe right neuroforaminal narrowing at C5-6 and C6-7. Electronically Signed   By: Helyn Numbers M.D.   On: 10/07/2023 20:09   DG Chest 1 View  Result Date: 10/07/2023 CLINICAL DATA:  Hip pain with fall EXAM: CHEST  1 VIEW COMPARISON:  04/17/2019 FINDINGS: Post sternotomy changes. Low lung volumes. No acute airspace disease or pleural effusion. Aortic atherosclerosis. No pneumothorax. IMPRESSION: Low lung volumes. Electronically Signed   By: Jasmine Pang M.D.   On: 10/07/2023 19:51   DG Hip Unilat W or Wo Pelvis 2-3 Views Right  Result Date: 10/07/2023 CLINICAL DATA:  Right hip pain after a fall. EXAM: DG HIP (WITH OR WITHOUT PELVIS) 2-3V RIGHT COMPARISON:  None Available. FINDINGS: Suspicion of subcapital femoral neck fracture on the right. Limited detail because of patient's size. Consider CT for confirmation. No other pelvic fracture. Previous hip replacement on the left. IMPRESSION: Suspicion of subcapital femoral neck fracture on the right. Consider CT for confirmation. Electronically Signed   By: Paulina Fusi M.D.   On: 10/07/2023 19:50    EKG: Independently reviewed. Sinus rhythm, PAC, 1st degree AV block, chronic LBBB.   Assessment/Plan   1. Right hip fracture  - Hold Pradaxa, continue pain-control, CMS checks, and supportive care, obtain cardiology input on recent stress test prior to surgery     2. CAD  - Hx of CABG in 2006  - She had NM  PET on 08/26/23 for episodes of chest heaviness and there was a small area of apical lateral ischemia  - No angina currently  - Continue Coreg, Imdur, Crestor    3. Atrial fibrillation  - In SR in ED  - Hold Pradaxa, continue Coreg   4. Insulin-dependent DM  - Check CBGs and continue insulin    5. Chronic diastolic CHF   - Appears compensated  - Continue Coreg and Aldactone, monitor weight and I/Os    6. CKD 3A - Appears close to baseline   - Renally-dose medication, hold ramipril perioperatively     DVT prophylaxis: SCDs  Code Status: Full  Level of Care: Level of care: Med-Surg Family Communication: Dr. Truett Perna updated by phone   Disposition Plan:  Patient is from: home  Anticipated d/c is to: TBD Anticipated d/c date is: 10/12/23  Patient currently: Pending orthopedic surgery consultation and likely operative hip repair  Consults called: Orthopedic surgery  Admission status: Inpatient     Briscoe Deutscher, MD [facility]  10/07/2023, 8:46 PM

## 2023-10-07 NOTE — ED Triage Notes (Signed)
BIB EMS from home for mechanical fall due to balance issues, pt fell and fell onto right side and has deformity and rotating of right hip. Pt is on blood thinners. Pt denies hitting head.

## 2023-10-07 NOTE — ED Provider Notes (Signed)
[place] EMERGENCY DEPARTMENT AT [facility] Provider Note   CSN: [id number] Arrival date & time: 10/07/23  1854     History  Chief Complaint  Patient presents with   Hip Pain   Fall    [name] is a 89 y.o. female history of A-fib on Pradaxa, hypertension, osteoporosis, here presenting with fall.  Patient has been feeling dizzy recently.  Patient apparently pulled fall at home and landed on the right hip.  Patient was noted to have a deformed right hip.  Unclear if she hit her head or not.  The history is provided by the patient.       Home Medications Prior to Admission medications   Medication Sig Start Date End Date Taking? Authorizing Provider  Accu-Chek FastClix Lancets MISC .    [provider]  acetaminophen (TYLENOL) 650 MG CR tablet Take 650 mg by mouth as needed.    [provider]  B-D UF III MINI PEN NEEDLES 31G X 5 MM MISC SMARTSIG:Pre-Filled Pen Syringe SUB-Q As Directed 12/03/21   [provider]  calcium elemental as carbonate (BARIATRIC TUMS ULTRA) 400 MG chewable tablet Chew 1,000 mg by mouth every evening.    [provider]  carvedilol (COREG) 6.25 MG tablet Take 1 tablet (6.25 mg total) by mouth 2 (two) times daily. 02/07/23   Christell Constant, MD  cholecalciferol (VITAMIN D) 1000 units tablet Take 2,000 Units by mouth daily.    [provider]  clobetasol cream (TEMOVATE) 0.05 % Apply 1 application topically 2 (two) times daily as needed (skin irritation).    [provider]  clonazePAM (KLONOPIN) 0.5 MG tablet Take 0.5 mg by mouth at bedtime. Pt takes 0.5mg  at bedtime and then a second dose .25mg  tablet if she wakes up in the middle of the night . 06/16/12   [provider]  Continuous Blood Gluc Sensor (FREESTYLE LIBRE 2 SENSOR) MISC as directed. 07/18/22   [provider]  docusate sodium (COLACE) 100 MG capsule as needed.    [provider]  ENBREL 50  MG/ML injection Inject 50 mg into the skin once a week.  04/07/19   [provider]  glucose blood (ACCU-CHEK GUIDE) test strip USE AS DIRECTED THREE TIMES DAILY TO  CHECK  BLOOD  GLUCOSE    [provider]  HUMALOG MIX 50/50 KWIKPEN (50-50) 100 UNIT/ML Kwikpen Inject 35 Units into the skin 2 (two) times daily. 56 Units in AM; 10 Units in the afternoon. 06/16/20   [provider]  Insulin Lispro Prot & Lispro (HUMALOG 50/50 MIX) (50-50) 100 UNIT/ML Kwikpen Inject 34 Units into the skin before breakfast, 6 units before lunch and 24 units before evening meal. 08/20/22   Talmage Coin, MD  Insulin Pen Needle (B-D UF III MINI PEN NEEDLES) 31G X 5 MM MISC See admin instructions.    [provider]  isosorbide mononitrate (IMDUR) 30 MG 24 hr tablet Take 0.5 tablets (15 mg total) by mouth daily. 06/25/22   Lyn Records, MD  loratadine (CLARITIN) 10 MG tablet daily at 6 (six) AM.    [provider]  nitroGLYCERIN (NITROSTAT) 0.4 MG SL tablet Place 1 tablet (0.4 mg total) under the tongue every 5 (five) minutes x 3 doses as needed for chest pain. 06/25/22   Lyn Records, MD  ondansetron (ZOFRAN) 4 MG tablet Take 1 tablet by mouth as needed. 05/17/22   [provider]  polyethylene glycol (MIRALAX /  GLYCOLAX) 17 g packet Take 17 g by mouth daily.    [provider]  PRADAXA 150 MG CAPS Take 150 mg by mouth 2 (two) times daily.  07/18/12   [provider]  Probiotic Product (ALIGN PO) Take 1 capsule by mouth daily.    [provider]  ramipril (ALTACE) 5 MG capsule TAKE ONE CAPSULE BY MOUTH TWICE DAILY 12/09/22   Lyn Records, MD  rosuvastatin (CRESTOR) 10 MG tablet Take 10 mg by mouth at bedtime.     [provider]  spironolactone (ALDACTONE) 25 MG tablet Take 0.5 tablets (12.5 mg total) by mouth daily. 06/06/23   Meriam Sprague, MD  vitamin B-12 (CYANOCOBALAMIN) 250 MCG tablet Take 250 mcg by mouth daily.    [provider]      Allergies    Codeine, Penicillins, Acetaminophen-codeine, Atenolol, Atorvastatin, Chloramphenicol, Dapagliflozin, Empagliflozin, Liraglutide, Metformin hcl, Metoprolol, Zetia [ezetimibe], and Sertraline    Review of Systems   Review of Systems  Musculoskeletal:        R hip pain   Neurological:  Positive for dizziness.  All other systems reviewed and are negative.   Physical Exam Updated Vital Signs BP (!) 156/68   Pulse 83   Temp 98.1 F (36.7 C) (Oral)   Resp 18   Ht 5' (1.524 m)   Wt 88.5 kg   SpO2 98%   BMI 38.10 kg/m  Physical Exam Vitals and nursing note reviewed.  Constitutional:      Comments: Uncomfortable   HENT:     Head: Normocephalic.     Nose: Nose normal.     Mouth/Throat:     Mouth: Mucous membranes are moist.  Eyes:     Extraocular Movements: Extraocular movements intact.     Pupils: Pupils are equal, round, and reactive to light.  Cardiovascular:     Rate and Rhythm: Normal rate and regular rhythm.     Pulses: Normal pulses.     Heart sounds: Normal heart sounds.  Pulmonary:     Effort: Pulmonary effort is normal.     Breath sounds: Normal breath sounds.  Abdominal:     General: Abdomen is flat.     Palpations: Abdomen is soft.  Musculoskeletal:     Cervical back: Normal range of motion and neck supple.     Comments: Right hip with obvious deformity. No other extremity trauma   Skin:    General: Skin is warm.     Capillary Refill: Capillary refill takes less than 2 seconds.  Neurological:     General: No focal deficit present.     Mental Status: She is oriented to person, place, and time.  Psychiatric:        Mood and Affect: Mood normal.        Behavior: Behavior normal.     ED Results / Procedures / Treatments   Labs (all labs ordered are listed, but only abnormal results are displayed) Labs Reviewed  CBC WITH DIFFERENTIAL/PLATELET  BASIC METABOLIC PANEL  PROTIME-INR    EKG EKG  Interpretation Date/Time:  Tuesday October 07 2023 19:04:56 EDT Ventricular Rate:  76 PR Interval:  258 QRS Duration:  137 QT Interval:  429 QTC Calculation: 483 R Axis:   -14  Text Interpretation: Sinus rhythm Atrial premature complex Prolonged PR interval Left bundle branch block No significant change since last tracing Confirmed by Richardean Canal [phone]) on 10/07/2023 7:09:30 PM  Radiology No results found.  Procedures Procedures  Medications Ordered in ED Medications  morphine (PF) 4 MG/ML injection 4 mg (4 mg Intravenous Given 10/07/23 1917)  ondansetron (ZOFRAN) injection 4 mg (4 mg Intravenous Given 10/07/23 1917)    ED Course/ Medical Decision Making/ A&P                                 Medical Decision Making CALINDA STOCKINGER is a 89 y.o. female here with R hip pain s/p fall. Concerned for possible hip fracture. Will get preop labs, xrays of hip and cxr, CT head/neck (possible head injury and patient on pradaxa).    8:17 PM X-ray showed right subcapital femoral neck fracture.  CT head and cervical spine unremarkable.  Labs unremarkable.  I talked to Dr. Despina Hick.  He states that patient should be admitted to the hospital service for pain control.  He will see the patient tomorrow.  He states that likely needs 2 to 3 days before he would operate on her since she is on Pradaxa.  Problems Addressed: Closed fracture of right hip, initial encounter [facility]): acute illness or injury Fall, initial encounter: acute illness or injury  Amount and/or Complexity of Data Reviewed Labs: ordered. Decision-making details documented in ED Course. Radiology: ordered and independent interpretation performed. Decision-making details documented in ED Course.  Risk Prescription drug management. Decision regarding hospitalization.    Final Clinical Impression(s) / ED Diagnoses Final diagnoses:  None    Rx / DC Orders ED Discharge Orders     None         Charlynne Pander,  MD 10/07/23 2018

## 2023-10-08 ENCOUNTER — Inpatient Hospital Stay (HOSPITAL_COMMUNITY): Payer: Medicare Other | Admitting: Anesthesiology

## 2023-10-08 ENCOUNTER — Inpatient Hospital Stay (HOSPITAL_COMMUNITY): Payer: Medicare Other

## 2023-10-08 ENCOUNTER — Ambulatory Visit: Payer: Self-pay | Admitting: Student

## 2023-10-08 DIAGNOSIS — Z01810 Encounter for preprocedural cardiovascular examination: Secondary | ICD-10-CM

## 2023-10-08 DIAGNOSIS — Z794 Long term (current) use of insulin: Secondary | ICD-10-CM | POA: Diagnosis not present

## 2023-10-08 DIAGNOSIS — I480 Paroxysmal atrial fibrillation: Secondary | ICD-10-CM | POA: Diagnosis not present

## 2023-10-08 DIAGNOSIS — S72001A Fracture of unspecified part of neck of right femur, initial encounter for closed fracture: Secondary | ICD-10-CM | POA: Diagnosis not present

## 2023-10-08 DIAGNOSIS — I2510 Atherosclerotic heart disease of native coronary artery without angina pectoris: Secondary | ICD-10-CM

## 2023-10-08 DIAGNOSIS — E1169 Type 2 diabetes mellitus with other specified complication: Secondary | ICD-10-CM | POA: Diagnosis not present

## 2023-10-08 LAB — GLUCOSE, CAPILLARY
Glucose-Capillary: 179 mg/dL — ABNORMAL HIGH (ref 70–99)
Glucose-Capillary: 232 mg/dL — ABNORMAL HIGH (ref 70–99)
Glucose-Capillary: 243 mg/dL — ABNORMAL HIGH (ref 70–99)
Glucose-Capillary: 226 mg/dL — ABNORMAL HIGH (ref 70–99)

## 2023-10-08 LAB — TYPE AND SCREEN
ABO/RH(D): A POS
Antibody Screen: NEGATIVE

## 2023-10-08 LAB — BASIC METABOLIC PANEL
Anion gap: 9 (ref 5–15)
BUN: 29 mg/dL — ABNORMAL HIGH (ref 8–23)
CO2: 21 mmol/L — ABNORMAL LOW (ref 22–32)
Calcium: 9.2 mg/dL (ref 8.9–10.3)
Chloride: 109 mmol/L (ref 98–111)
Creatinine, Ser: 0.95 mg/dL (ref 0.44–1.00)
GFR, Estimated: 58 mL/min — ABNORMAL LOW (ref >60)
Glucose, Bld: 192 mg/dL — ABNORMAL HIGH (ref 70–99)
Potassium: 5.3 mmol/L — ABNORMAL HIGH (ref 3.5–5.1)
Sodium: 139 mmol/L (ref 135–145)

## 2023-10-08 LAB — SURGICAL PCR SCREEN
MRSA, PCR: NEGATIVE
Staphylococcus aureus: NEGATIVE

## 2023-10-08 LAB — CBC
HCT: 39.6 % (ref 36.0–46.0)
Hemoglobin: 12.1 g/dL (ref 12.0–15.0)
MCH: 30.5 pg (ref 26.0–34.0)
MCHC: 30.6 g/dL (ref 30.0–36.0)
MCV: 99.7 fL (ref 80.0–100.0)
Platelets: UNDETERMINED 10*3/uL (ref 150–400)
RBC: 3.97 MIL/uL (ref 3.87–5.11)
RDW: 11.9 % (ref 11.5–15.5)
WBC: 9.3 10*3/uL (ref 4.0–10.5)
nRBC: 0 % (ref 0.0–0.2)

## 2023-10-08 LAB — HEMOGLOBIN A1C
Hgb A1c MFr Bld: 7.3 % — ABNORMAL HIGH (ref 4.8–5.6)
Mean Plasma Glucose: 162.81 mg/dL

## 2023-10-08 MED ORDER — ENSURE ENLIVE PO LIQD
237.0000 mL | Freq: Two times a day (BID) | ORAL | Status: DC
  Administered 2023-10-08 – 2023-10-13 (×4): 237 mL via ORAL

## 2023-10-08 MED ORDER — ROPIVACAINE HCL 5 MG/ML IJ SOLN
INTRAMUSCULAR | Status: DC | PRN
Start: 2023-10-08 — End: 2023-10-08
  Administered 2023-10-08: 30 mL via PERINEURAL

## 2023-10-08 MED ORDER — ACETAMINOPHEN 325 MG PO TABS
650.0000 mg | ORAL_TABLET | Freq: Four times a day (QID) | ORAL | Status: DC | PRN
  Administered 2023-10-08: 650 mg via ORAL
  Filled 2023-10-08: qty 2

## 2023-10-08 MED ORDER — VANCOMYCIN HCL IN DEXTROSE 1-5 GM/200ML-% IV SOLN
1000.0000 mg | Freq: Once | INTRAVENOUS | Status: AC
  Administered 2023-10-08: 1000 mg via INTRAVENOUS
  Filled 2023-10-08: qty 200

## 2023-10-08 MED ORDER — CLONIDINE HCL (ANALGESIA) 100 MCG/ML EP SOLN
EPIDURAL | Status: DC | PRN
Start: 2023-10-08 — End: 2023-10-08
  Administered 2023-10-08: 100 ug

## 2023-10-08 MED ORDER — CLONAZEPAM 0.5 MG PO TABS
0.5000 mg | ORAL_TABLET | Freq: Every evening | ORAL | Status: DC | PRN
  Administered 2023-10-09: 0.5 mg via ORAL
  Filled 2023-10-08 (×2): qty 1

## 2023-10-08 MED ORDER — FENTANYL CITRATE PF 50 MCG/ML IJ SOSY: 50.0000 ug | PREFILLED_SYRINGE | Freq: Once | INTRAMUSCULAR | Status: AC

## 2023-10-08 MED ORDER — OXYCODONE HCL 5 MG PO TABS
5.0000 mg | ORAL_TABLET | ORAL | Status: DC | PRN
  Administered 2023-10-08 – 2023-10-09 (×3): 5 mg via ORAL
  Filled 2023-10-08 (×3): qty 1

## 2023-10-08 MED ORDER — FENTANYL CITRATE PF 50 MCG/ML IJ SOSY
PREFILLED_SYRINGE | INTRAMUSCULAR | Status: AC
  Administered 2023-10-08: 25 ug via INTRAVENOUS
  Filled 2023-10-08: qty 2

## 2023-10-08 MED ORDER — ADULT MULTIVITAMIN W/MINERALS CH
1.0000 | ORAL_TABLET | Freq: Every day | ORAL | Status: DC
  Administered 2023-10-08 – 2023-10-13 (×5): 1 via ORAL
  Filled 2023-10-08 (×5): qty 1

## 2023-10-08 MED ORDER — FENTANYL CITRATE PF 50 MCG/ML IJ SOSY: 12.5000 ug | PREFILLED_SYRINGE | INTRAMUSCULAR | Status: DC | PRN

## 2023-10-08 MED ORDER — CLONAZEPAM 0.5 MG PO TABS: 0.5000 mg | ORAL_TABLET | Freq: Two times a day (BID) | ORAL | Status: DC | PRN

## 2023-10-08 NOTE — Hospital Course (Addendum)
89 year old woman PMH including atrial fibrillation on Pradaxa, CABG, rheumatoid arthritis, presented after a mechanical fall resulting in right hip pain.  Imaging revealed hip fracture.  Underwent right hip arthroplasty 10/10.  Plan for SNF.  Consultants Orthopedics  Cardiology   Procedures 10/10 Right total hip arthroplasty, anterior approach

## 2023-10-08 NOTE — H&P (View-Only) (Signed)
Reason for Consult:Right hip pain Referring Physician: Dr Doree Albee is an 89 y.o. female.  HPI: [name] is an 89 yo female who had a mechanical fall last night with immediate right hip pain and inability to get up. She did not have any dizziness, light headedness or prodromal symptoms associated with the fall. She states that she has balance problems and that led to her fall last night. She did not hit her head or have a loss of consciousness. Her right hip is her only area of pain.   Past Medical History:  Diagnosis Date   Anginal pain (HCC)    "saw doctor-think it was esophagus spasm"   Arthritis    Atrial flutter (HCC) 11/02/2013   Cataract    left eye   Coronary artery disease    Diabetes mellitus without complication (HCC)    type 2   Essential hypertension, benign 11/02/2013   H/O jaundice    as child   Headache 2015   complicated migraine x2    Hearing trouble    Heart disease    Hepatitis    hx of hepatitis A as child   High cholesterol    under control   Long term (current) use of anticoagulants 11/02/2013   Pradaxa    Measles    as child   Mouth dryness    Mumps    as teenager   Numbness and tingling    lips   Osteoporosis    "unsure-maybe in knees"   Psoriasis    Psoriatic arthritis (HCC)    Stroke (HCC) 2012   Vertigo    being monitored, occasional    Past Surgical History:  Procedure Laterality Date   APPENDECTOMY  as child   CARDIAC CATHETERIZATION  ~2006   clear   CORONARY ARTERY BYPASS GRAFT  02/16/2005   LUMBAR DISC SURGERY  09/2004   L1-L5   TONSILLECTOMY  as child   with adenoids   TOTAL HIP ARTHROPLASTY Left 04/19/2019   Procedure: TOTAL HIP ARTHROPLASTY ANTERIOR APPROACH;  Surgeon: Ollen Gross, MD;  Location: [facility];  Service: Orthopedics;  Laterality: Left;   TOTAL KNEE ARTHROPLASTY Left 09/11/2015   Procedure: LEFT TOTAL KNEE ARTHROPLASTY;  Surgeon: Ollen Gross, MD;  Location: [facility];  Service: Orthopedics;   Laterality: Left;    Family History  Problem Relation Age of Onset   Heart failure Mother    Neuromuscular disorder Father     Social History:  reports that she has never smoked. She has never used smokeless tobacco. She reports that she does not drink alcohol and does not use drugs.  Allergies:  Allergies  Allergen Reactions   Codeine Nausea And Vomiting   Penicillins Swelling and Other (See Comments)    Did it involve swelling of the face/tongue/throat, SOB, or low BP? Yes Did it involve sudden or severe rash/hives, skin peeling, or any reaction on the inside of your mouth or nose? Unknown Did you need to seek medical attention at a hospital or doctor's office? Unknown When did it last happen?  Unknown If all above answers are "NO", may proceed with cephalosporin use.  Swelling of tongue   Acetaminophen-Codeine     Other reaction(s): Up set stomach   Atenolol Other (See Comments)   Atorvastatin     Muscle soreness   Chloramphenicol Other (See Comments)   Dapagliflozin     Other reaction(s): yeast infections   Empagliflozin     Other reaction(s): yeast infections  Liraglutide     Other reaction(s): constipation, bloating, nausea   Metformin Hcl Other (See Comments)   Metoprolol Other (See Comments)   Zetia [Ezetimibe] Other (See Comments)   Sertraline Palpitations    Medications: I have reviewed the patient's current medications.  Results for orders placed or performed during the hospital encounter of 10/07/23 (from the past 48 hour(s))  CBC with Differential     Status: None   Collection Time: 10/07/23  7:17 PM  Result Value Ref Range   WBC 6.7 4.0 - 10.5 K/uL   RBC 4.19 3.87 - 5.11 MIL/uL   Hemoglobin 12.7 12.0 - 15.0 g/dL   HCT 16.1 09.6 - 04.5 %   MCV 93.3 80.0 - 100.0 fL   MCH 30.3 26.0 - 34.0 pg   MCHC 32.5 30.0 - 36.0 g/dL   RDW 40.9 81.1 - 91.4 %   Platelets 156 150 - 400 K/uL   nRBC 0.0 0.0 - 0.2 %   Neutrophils Relative % 48 %   Neutro Abs 3.2 1.7  - 7.7 K/uL   Lymphocytes Relative 41 %   Lymphs Abs 2.7 0.7 - 4.0 K/uL   Monocytes Relative 8 %   Monocytes Absolute 0.6 0.1 - 1.0 K/uL   Eosinophils Relative 3 %   Eosinophils Absolute 0.2 0.0 - 0.5 K/uL   Basophils Relative 0 %   Basophils Absolute 0.0 0.0 - 0.1 K/uL   Immature Granulocytes 0 %   Abs Immature Granulocytes 0.02 0.00 - 0.07 K/uL    Comment: Performed at [facility], [address] [place], [place] [address]  Basic metabolic panel     Status: Abnormal   Collection Time: 10/07/23  7:17 PM  Result Value Ref Range   Sodium 138 135 - 145 mmol/L   Potassium 4.6 3.5 - 5.1 mmol/L   Chloride 109 98 - 111 mmol/L   CO2 22 22 - 32 mmol/L   Glucose, Bld 116 (H) 70 - 99 mg/dL    Comment: Glucose reference range applies only to samples taken after fasting for at least 8 hours.   BUN 33 (H) 8 - 23 mg/dL   Creatinine, Ser 6.21 (H) 0.44 - 1.00 mg/dL   Calcium 9.2 8.9 - 30.8 mg/dL   GFR, Estimated 51 (L) >60 mL/min    Comment: (NOTE) Calculated using the CKD-EPI Creatinine Equation (2021)    Anion gap 7 5 - 15    Comment: Performed at [facility], [address], [place], [place] [address]  Protime-INR     Status: Abnormal   Collection Time: 10/07/23  7:17 PM  Result Value Ref Range   Prothrombin Time 26.4 (H) 11.4 - 15.2 seconds   INR 2.4 (H) 0.8 - 1.2    Comment: (NOTE) INR goal varies based on device and disease states. Performed at [facility], [address] [place], [place] [address]   Glucose, capillary     Status: Abnormal   Collection Time: 10/07/23 10:40 PM  Result Value Ref Range   Glucose-Capillary 154 (H) 70 - 99 mg/dL    Comment: Glucose reference range applies only to samples taken after fasting for at least 8 hours.  Basic metabolic panel     Status: Abnormal   Collection Time: 10/08/23  3:56 AM  Result Value Ref Range   Sodium 139 135 - 145 mmol/L   Potassium 5.3 (H) 3.5 - 5.1 mmol/L    Chloride 109 98 - 111 mmol/L  CO2 21 (L) 22 - 32 mmol/L   Glucose, Bld 192 (H) 70 - 99 mg/dL    Comment: Glucose reference range applies only to samples taken after fasting for at least 8 hours.   BUN 29 (H) 8 - 23 mg/dL   Creatinine, Ser 1.61 0.44 - 1.00 mg/dL   Calcium 9.2 8.9 - 09.6 mg/dL   GFR, Estimated 58 (L) >60 mL/min    Comment: (NOTE) Calculated using the CKD-EPI Creatinine Equation (2021)    Anion gap 9 5 - 15    Comment: Performed at [facility], [address] [place], [place] [address]  CBC     Status: None   Collection Time: 10/08/23  3:56 AM  Result Value Ref Range   WBC 9.3 4.0 - 10.5 K/uL   RBC 3.97 3.87 - 5.11 MIL/uL   Hemoglobin 12.1 12.0 - 15.0 g/dL   HCT 98.1 19.1 - 47.8 %   MCV 99.7 80.0 - 100.0 fL   MCH 30.5 26.0 - 34.0 pg   MCHC 30.6 30.0 - 36.0 g/dL   RDW 29.5 62.1 - 30.8 %   Platelets PLATELET CLUMPS NOTED ON SMEAR, UNABLE TO ESTIMATE 150 - 400 K/uL   nRBC 0.0 0.0 - 0.2 %    Comment: Performed at [facility], [address], [place], [place] [address]  Type and screen [facility]     Status: None   Collection Time: 10/08/23  3:56 AM  Result Value Ref Range   ABO/RH(D) A POS    Antibody Screen NEG    Sample Expiration      10/11/2023,2359 Performed at [facility], [address] [place], [place] [address]     CT HEAD WO CONTRAST ( )  Result Date: 10/07/2023 CLINICAL DATA:  Mental status change, unknown cause dizziness, fall; Neck trauma (Age >= 65y) EXAM: CT HEAD WITHOUT CONTRAST CT CERVICAL SPINE WITHOUT CONTRAST TECHNIQUE: Multidetector CT imaging of the head and cervical spine was performed following the standard protocol without intravenous contrast. Multiplanar CT image reconstructions of the cervical spine were also generated. RADIATION DOSE REDUCTION: This exam was performed according to the departmental dose-optimization program which includes automated  exposure control, adjustment of the mA and/or kV according to patient size and/or use of iterative reconstruction technique. COMPARISON:  None Available. FINDINGS: CT HEAD FINDINGS Brain: Normal anatomic configuration. Parenchymal volume loss is commensurate with the patient's age. Stable mild periventricular white matter changes are present likely reflecting the sequela of small vessel ischemia. Remote left cerebellar and right thalamic infarcts again noted. No abnormal intra or extra-axial mass lesion or fluid collection. No abnormal mass effect or midline shift. No evidence of acute intracranial hemorrhage or infarct. Ventricular size is normal. Cerebellum unremarkable. Vascular: No asymmetric hyperdense vasculature at the skull base. Skull: Intact Sinuses/Orbits: Paranasal sinuses are clear. Orbits are unremarkable. Other: Mastoid air cells and middle ear cavities are clear. CT CERVICAL SPINE FINDINGS Alignment: Normal. Skull base and vertebrae: Craniocervical alignment is normal. The atlantodental interval is not widened. No acute fracture of the cervical spine. Vertebral body height is preserved. Ankylosis of the facet joints of C3-4 noted bilaterally. Soft tissues and spinal canal: No prevertebral fluid or swelling. No visible canal hematoma. Disc levels: Intervertebral disc space narrowing and endplate remodeling at C5-C7 is present in keeping with changes of moderate degenerative disc disease. Prevertebral soft tissues are not thickened on sagittal reformats. Spinal canal is widely patent. Uncovertebral arthrosis results in moderate  to severe right neuroforaminal narrowing at C5-6 and C6-7. Upper chest: Negative. Other: None IMPRESSION: 1. No acute intracranial abnormality. No calvarial fracture. 2. Stable senescent change and remote left cerebellar and right thalamic infarcts. 3. No acute fracture or listhesis of the cervical spine. 4. Multilevel degenerative disc and degenerative joint disease resulting  in moderate to severe right neuroforaminal narrowing at C5-6 and C6-7. Electronically Signed   By: Helyn Numbers M.D.   On: 10/07/2023 20:09   CT Cervical Spine Wo Contrast  Result Date: 10/07/2023 CLINICAL DATA:  Mental status change, unknown cause dizziness, fall; Neck trauma (Age >= 65y) EXAM: CT HEAD WITHOUT CONTRAST CT CERVICAL SPINE WITHOUT CONTRAST TECHNIQUE: Multidetector CT imaging of the head and cervical spine was performed following the standard protocol without intravenous contrast. Multiplanar CT image reconstructions of the cervical spine were also generated. RADIATION DOSE REDUCTION: This exam was performed according to the departmental dose-optimization program which includes automated exposure control, adjustment of the mA and/or kV according to patient size and/or use of iterative reconstruction technique. COMPARISON:  None Available. FINDINGS: CT HEAD FINDINGS Brain: Normal anatomic configuration. Parenchymal volume loss is commensurate with the patient's age. Stable mild periventricular white matter changes are present likely reflecting the sequela of small vessel ischemia. Remote left cerebellar and right thalamic infarcts again noted. No abnormal intra or extra-axial mass lesion or fluid collection. No abnormal mass effect or midline shift. No evidence of acute intracranial hemorrhage or infarct. Ventricular size is normal. Cerebellum unremarkable. Vascular: No asymmetric hyperdense vasculature at the skull base. Skull: Intact Sinuses/Orbits: Paranasal sinuses are clear. Orbits are unremarkable. Other: Mastoid air cells and middle ear cavities are clear. CT CERVICAL SPINE FINDINGS Alignment: Normal. Skull base and vertebrae: Craniocervical alignment is normal. The atlantodental interval is not widened. No acute fracture of the cervical spine. Vertebral body height is preserved. Ankylosis of the facet joints of C3-4 noted bilaterally. Soft tissues and spinal canal: No prevertebral fluid or  swelling. No visible canal hematoma. Disc levels: Intervertebral disc space narrowing and endplate remodeling at C5-C7 is present in keeping with changes of moderate degenerative disc disease. Prevertebral soft tissues are not thickened on sagittal reformats. Spinal canal is widely patent. Uncovertebral arthrosis results in moderate to severe right neuroforaminal narrowing at C5-6 and C6-7. Upper chest: Negative. Other: None IMPRESSION: 1. No acute intracranial abnormality. No calvarial fracture. 2. Stable senescent change and remote left cerebellar and right thalamic infarcts. 3. No acute fracture or listhesis of the cervical spine. 4. Multilevel degenerative disc and degenerative joint disease resulting in moderate to severe right neuroforaminal narrowing at C5-6 and C6-7. Electronically Signed   By: Helyn Numbers M.D.   On: 10/07/2023 20:09   DG Chest 1 View  Result Date: 10/07/2023 CLINICAL DATA:  Hip pain with fall EXAM: CHEST  1 VIEW COMPARISON:  04/17/2019 FINDINGS: Post sternotomy changes. Low lung volumes. No acute airspace disease or pleural effusion. Aortic atherosclerosis. No pneumothorax. IMPRESSION: Low lung volumes. Electronically Signed   By: Jasmine Pang M.D.   On: 10/07/2023 19:51   DG Hip Unilat W or Wo Pelvis 2-3 Views Right  Result Date: 10/07/2023 CLINICAL DATA:  Right hip pain after a fall. EXAM: DG HIP (WITH OR WITHOUT PELVIS) 2-3V RIGHT COMPARISON:  None Available. FINDINGS: Suspicion of subcapital femoral neck fracture on the right. Limited detail because of patient's size. Consider CT for confirmation. No other pelvic fracture. Previous hip replacement on the left. IMPRESSION: Suspicion of subcapital femoral neck fracture on the  right. Consider CT for confirmation. Electronically Signed   By: Paulina Fusi M.D.   On: 10/07/2023 19:50    Review of Systems Blood pressure (!) 125/56, pulse (!) 101, temperature 98.4 F (36.9 C), temperature source Oral, resp. rate 18, height 5'  (1.524 m), weight 88.5 kg, SpO2 93%. Physical Exam Physical Examination: General appearance - alert, well appearing, and in no distress Mental status - alert, oriented to person, place, and time Neurological - alert, oriented, normal speech, no focal findings or movement disorder noted Right lower extremity- Tender over right hip with pain on any attempted hip rotation; EHL/FHL/TA/Gastroc intact; sensation intact; foot warm and pink   Assessment/Plan: Right femoral neck fracture- She has a partially displaced fracture with underlying osteoarthritis of the right hip. This will require surgical treatment to allow her to mobilize and ambulate again. Unfortunately I will not be available until next Monday to operate so I will see if one of my partners can do it sooner. Her INR is currently 2.4 so that should be repeated today. She is on Pradaxa so that should be held until surgery.  [name] 10/08/2023, 7:01 AM

## 2023-10-08 NOTE — Consult Note (Signed)
Cardiology Consultation   Patient ID: [name] MRN: [id number]; DOB: Oct 20, 1934  Admit date: 10/07/2023 Date of Consult: 10/08/2023  PCP:  April Aspen, MD   [facility] Providers Cardiologist:  Lesleigh Noe, MD (Inactive)        Patient Profile:   [name] is a 89 y.o. female with a history of CAD s/p CABG in 2006, chronic diastolic CHF, paroxysmal atrial fibrillation on Pradaxa, hypertension, hyperlipidemia, type 2 diabetes mellitus on insulin, CVA, rheumatoid arthritis, and migraines who is being seen 10/08/2023 for pre-op evaluation for hip fracture at the request of Dr. Irene [name].  History of Present Illness:   Ms. [name] is a 89 year old female with the above history who was previously followed by Dr. Katrinka [name] and Dr. Shari [name] who is now scheduled to get established with Dr. Cristal [name] next month.  She has a history of CAD with prior CABG 2006.  IVU in 2016 was low risk with no evidence of ischemia.  Last echo in 01/2022 showed LVEF of 55-60% with grade 1 diastolic dysfunction and severe mitral annular calcification.  She was last seen by Dr. Shari [name] in 04/2003 at which time she reported episodes of chest heaviness usually after eating breakfast but also with exertion.  These episodes had been occurring more frequently over the past several weeks.  She denied any shortness of breath or acute CHF symptoms.  Cardiac PET stress test was ordered for further evaluation and showed a small area of apical lateral ischemia.  Overall considered intermediate risk.  Presented to the ED yesterday after a mechanical fall and was found to have a femoral neck fracture.  Ortho was consulted and she will require surgical treatment to allow her to mobilize and ambulate again.  Cardiology consulted for preop evaluation.  Patient notes that she is doing poorly.  She feels very tired and nausea after her nerve block.     No SOB/DOE and no PND/Orthopnea.  No weight gain  or leg swelling.  No palpitations or syncope.  Her anginal equivalent is chest discomfort and heart racing.  She has not felt this in some time.   Rare, pinpoint chest discomfort in the center of her chest that is unlike her prior angina.  She denies any of the chest heaviness she has with Dr. Shari [name] in May.   Past Medical History:  Diagnosis Date   Anginal pain (HCC)    "saw doctor-think it was esophagus spasm"   Arthritis    Atrial flutter (HCC) 11/02/2013   Cataract    left eye   Coronary artery disease    Diabetes mellitus without complication (HCC)    type 2   Essential hypertension, benign 11/02/2013   H/O jaundice    as child   Headache 2015   complicated migraine x2    Hearing trouble    Heart disease    Hepatitis    hx of hepatitis A as child   High cholesterol    under control   Long term (current) use of anticoagulants 11/02/2013   Pradaxa    Measles    as child   Mouth dryness    Mumps    as teenager   Numbness and tingling    lips   Osteoporosis    "unsure-maybe in knees"   Psoriasis    Psoriatic arthritis (HCC)    Stroke (HCC) 2012   Vertigo    being monitored, occasional    Past Surgical History:  Procedure Laterality  Date   APPENDECTOMY  as child   CARDIAC CATHETERIZATION  ~2006   clear   CORONARY ARTERY BYPASS GRAFT  02/16/2005   LUMBAR DISC SURGERY  09/2004   L1-L5   TONSILLECTOMY  as child   with adenoids   TOTAL HIP ARTHROPLASTY Left 04/19/2019   Procedure: TOTAL HIP ARTHROPLASTY ANTERIOR APPROACH;  Surgeon: Ollen Gross, MD;  Location: [facility];  Service: Orthopedics;  Laterality: Left;   TOTAL KNEE ARTHROPLASTY Left 09/11/2015   Procedure: LEFT TOTAL KNEE ARTHROPLASTY;  Surgeon: Ollen Gross, MD;  Location: [facility];  Service: Orthopedics;  Laterality: Left;       Inpatient Medications: Scheduled Meds:  carvedilol  6.25 mg Oral BID   feeding supplement  237 mL Oral BID BM   insulin aspart  0-5 Units Subcutaneous QHS   insulin aspart   0-9 Units Subcutaneous TID WC   insulin aspart  3 Units Subcutaneous TID WC   insulin glargine-yfgn  30 Units Subcutaneous BID   isosorbide mononitrate  15 mg Oral Daily   multivitamin with minerals  1 tablet Oral Daily   rosuvastatin  10 mg Oral QHS   Continuous Infusions:  methocarbamol (ROBAXIN) IV     PRN Meds: fentaNYL (SUBLIMAZE) injection, methocarbamol **OR** methocarbamol (ROBAXIN) IV, ondansetron (ZOFRAN) IV, oxyCODONE, senna-docusate  Allergies:    Allergies  Allergen Reactions   Codeine Nausea And Vomiting   Penicillins Swelling    Swelling of tongue   Acetaminophen-Codeine Other (See Comments)    Upset stomach   Atenolol Other (See Comments)   Atorvastatin Other (See Comments)    Muscle soreness   Chloramphenicol Other (See Comments)   Dapagliflozin Other (See Comments)     yeast infections   Empagliflozin Other (See Comments)    yeast infections   Liraglutide Nausea Only and Other (See Comments)    constipation, bloating,    Metformin Hcl Other (See Comments)   Metoprolol Other (See Comments)   Zetia [Ezetimibe] Other (See Comments)   Sertraline Palpitations    Social History:   Social History   Socioeconomic History   Marital status: Married    Spouse name: Jillyn Hidden   Number of children: 3   Years of education: 16   Highest education level: Bachelor's degree (e.g., BA, AB, BS)  Occupational History   Occupation: Retired  Tobacco Use   Smoking status: Never   Smokeless tobacco: Never  Vaping Use   Vaping status: Never Used  Substance and Sexual Activity   Alcohol use: No    Alcohol/week: 0.0 standard drinks of alcohol   Drug use: No   Sexual activity: Not on file  Other Topics Concern   Not on file  Social History Narrative   Patient is married with 3 children.   Patient is left handed.   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   Patient drinks 3 cups daily.   Social Determinants of Health   Financial Resource Strain: Not on  file  Food Insecurity: No Food Insecurity (10/07/2023)   Hunger Vital Sign    Worried About Running Out of Food in the Last Year: Never true    Ran Out of Food in the Last Year: Never true  Transportation Needs: No Transportation Needs (10/07/2023)   PRAPARE - Administrator, Civil Service (Medical): No    Lack of Transportation (Non-Medical): No  Physical Activity: Not on file  Stress: Not on file  Social Connections: Not on file  Intimate Partner Violence:  Not At Risk (10/07/2023)   Humiliation, Afraid, Rape, and Kick questionnaire    Fear of Current or Ex-Partner: No    Emotionally Abused: No    Physically Abused: No    Sexually Abused: No    Family History:    Family History  Problem Relation Age of Onset   Heart failure Mother    Neuromuscular disorder Father      ROS:  Please see the history of present illness.   Physical Exam/Data:   Vitals:   10/07/23 2212 10/07/23 2235 10/08/23 0222 10/08/23 0933  BP: (!) 146/76 (!) 134/49 (!) 125/56 (!) 141/58  Pulse: 96 96 (!) 101 89  Resp: 18 17 18 16   Temp: 99.3 F (37.4 C) 97.8 F (36.6 C) 98.4 F (36.9 C) 98.1 F (36.7 C)  TempSrc:  Oral Oral   SpO2: 93% 96% 93% 92%  Weight:      Height:        Intake/Output Summary (Last 24 hours) at 10/08/2023 1256 Last data filed at 10/08/2023 1116 Gross per 24 hour  Intake 750 ml  Output 170 ml  Net 580 ml      10/07/2023    7:04 PM 05/20/2023   11:45 AM 04/15/2023   11:17 AM  Last 3 Weights  Weight (lbs) 195 lb 1.7 oz 195 lb 3.2 oz 194 lb 12.8 oz  Weight (kg) 88.5 kg 88.542 kg 88.361 kg     Body mass index is 38.1 kg/m.  General:  Frail, elderly female with morbid obesity HEENT: normal Neck: no JVD Vascular: No carotid bruits; Distal pulses 2+ bilaterally Cardiac:  normal S1, S2;  RRR; Systolic cresecndo murmur and a diastolic rumble Lungs:  clear to auscultation bilaterally, no wheezing, rhonchi or rales  Abd: soft, nontender, no hepatomegaly   Musculoskeletal:  No deformities, BUE and BLE strength normal and equal Skin: warm and dry  Neuro:  CNs 2-12 intact, no focal abnormalities noted Psych:  Normal affect   EKG:  The EKG was personally reviewed and demonstrates:  Normal sinus rhythm, heart rate 76 bpm, with first-degree AV block and chronic LBBB. Telemetry:  NA   Laboratory Data:  High Sensitivity Troponin:  No results for input(s): "TROPONINIHS" in the last 720 hours.   Chemistry Recent Labs  Lab 10/07/23 1917 10/08/23 0356  NA 138 139  K 4.6 5.3*  CL 109 109  CO2 22 21*  GLUCOSE 116* 192*  BUN 33* 29*  CREATININE 1.05* 0.95  CALCIUM 9.2 9.2  GFRNONAA 51* 58*  ANIONGAP 7 9    No results for input(s): "PROT", "ALBUMIN", "AST", "ALT", "ALKPHOS", "BILITOT" in the last 168 hours. Lipids No results for input(s): "CHOL", "TRIG", "HDL", "LABVLDL", "LDLCALC", "CHOLHDL" in the last 168 hours.  Hematology Recent Labs  Lab 10/07/23 1917 10/08/23 0356  WBC 6.7 9.3  RBC 4.19 3.97  HGB 12.7 12.1  HCT 39.1 39.6  MCV 93.3 99.7  MCH 30.3 30.5  MCHC 32.5 30.6  RDW 12.2 11.9  PLT 156 PLATELET CLUMPS NOTED ON SMEAR, UNABLE TO ESTIMATE   Thyroid No results for input(s): "TSH", "FREET4" in the last 168 hours.  BNPNo results for input(s): "BNP", "PROBNP" in the last 168 hours.  DDimer No results for input(s): "DDIMER" in the last 168 hours.   Radiology/Studies:  DG Knee Right Port  Result Date: 10/08/2023 CLINICAL DATA:  Closed displaced fracture right femoral neck. EXAM: PORTABLE RIGHT KNEE - 1-2 VIEW COMPARISON:  Right knee radiographs 07/08/2007 FINDINGS: Diffuse decreased bone  mineralization. Severe medial compartment joint space narrowing and peripheral osteophytosis. Mild peripheral lateral compartment degenerative osteophytosis. Severe patellofemoral joint space narrowing and peripheral osteophytosis. No joint effusion. No acute fracture or dislocation. Surgical clips again overlie the posteromedial knee. No  acute fracture is seen. No dislocation. IMPRESSION: Severe medial and patellofemoral compartment osteoarthritis. Electronically Signed   By: Neita Garnet M.D.   On: 10/08/2023 08:42   CT HEAD WO CONTRAST ( )  Result Date: 10/07/2023 CLINICAL DATA:  Mental status change, unknown cause dizziness, fall; Neck trauma (Age >= 65y) EXAM: CT HEAD WITHOUT CONTRAST CT CERVICAL SPINE WITHOUT CONTRAST TECHNIQUE: Multidetector CT imaging of the head and cervical spine was performed following the standard protocol without intravenous contrast. Multiplanar CT image reconstructions of the cervical spine were also generated. RADIATION DOSE REDUCTION: This exam was performed according to the departmental dose-optimization program which includes automated exposure control, adjustment of the mA and/or kV according to patient size and/or use of iterative reconstruction technique. COMPARISON:  None Available. FINDINGS: CT HEAD FINDINGS Brain: Normal anatomic configuration. Parenchymal volume loss is commensurate with the patient's age. Stable mild periventricular white matter changes are present likely reflecting the sequela of small vessel ischemia. Remote left cerebellar and right thalamic infarcts again noted. No abnormal intra or extra-axial mass lesion or fluid collection. No abnormal mass effect or midline shift. No evidence of acute intracranial hemorrhage or infarct. Ventricular size is normal. Cerebellum unremarkable. Vascular: No asymmetric hyperdense vasculature at the skull base. Skull: Intact Sinuses/Orbits: Paranasal sinuses are clear. Orbits are unremarkable. Other: Mastoid air cells and middle ear cavities are clear. CT CERVICAL SPINE FINDINGS Alignment: Normal. Skull base and vertebrae: Craniocervical alignment is normal. The atlantodental interval is not widened. No acute fracture of the cervical spine. Vertebral body height is preserved. Ankylosis of the facet joints of C3-4 noted bilaterally. Soft tissues and  spinal canal: No prevertebral fluid or swelling. No visible canal hematoma. Disc levels: Intervertebral disc space narrowing and endplate remodeling at C5-C7 is present in keeping with changes of moderate degenerative disc disease. Prevertebral soft tissues are not thickened on sagittal reformats. Spinal canal is widely patent. Uncovertebral arthrosis results in moderate to severe right neuroforaminal narrowing at C5-6 and C6-7. Upper chest: Negative. Other: None IMPRESSION: 1. No acute intracranial abnormality. No calvarial fracture. 2. Stable senescent change and remote left cerebellar and right thalamic infarcts. 3. No acute fracture or listhesis of the cervical spine. 4. Multilevel degenerative disc and degenerative joint disease resulting in moderate to severe right neuroforaminal narrowing at C5-6 and C6-7. Electronically Signed   By: Helyn Numbers M.D.   On: 10/07/2023 20:09   CT Cervical Spine Wo Contrast  Result Date: 10/07/2023 CLINICAL DATA:  Mental status change, unknown cause dizziness, fall; Neck trauma (Age >= 65y) EXAM: CT HEAD WITHOUT CONTRAST CT CERVICAL SPINE WITHOUT CONTRAST TECHNIQUE: Multidetector CT imaging of the head and cervical spine was performed following the standard protocol without intravenous contrast. Multiplanar CT image reconstructions of the cervical spine were also generated. RADIATION DOSE REDUCTION: This exam was performed according to the departmental dose-optimization program which includes automated exposure control, adjustment of the mA and/or kV according to patient size and/or use of iterative reconstruction technique. COMPARISON:  None Available. FINDINGS: CT HEAD FINDINGS Brain: Normal anatomic configuration. Parenchymal volume loss is commensurate with the patient's age. Stable mild periventricular white matter changes are present likely reflecting the sequela of small vessel ischemia. Remote left cerebellar and right thalamic infarcts again noted. No abnormal  intra  or extra-axial mass lesion or fluid collection. No abnormal mass effect or midline shift. No evidence of acute intracranial hemorrhage or infarct. Ventricular size is normal. Cerebellum unremarkable. Vascular: No asymmetric hyperdense vasculature at the skull base. Skull: Intact Sinuses/Orbits: Paranasal sinuses are clear. Orbits are unremarkable. Other: Mastoid air cells and middle ear cavities are clear. CT CERVICAL SPINE FINDINGS Alignment: Normal. Skull base and vertebrae: Craniocervical alignment is normal. The atlantodental interval is not widened. No acute fracture of the cervical spine. Vertebral body height is preserved. Ankylosis of the facet joints of C3-4 noted bilaterally. Soft tissues and spinal canal: No prevertebral fluid or swelling. No visible canal hematoma. Disc levels: Intervertebral disc space narrowing and endplate remodeling at C5-C7 is present in keeping with changes of moderate degenerative disc disease. Prevertebral soft tissues are not thickened on sagittal reformats. Spinal canal is widely patent. Uncovertebral arthrosis results in moderate to severe right neuroforaminal narrowing at C5-6 and C6-7. Upper chest: Negative. Other: None IMPRESSION: 1. No acute intracranial abnormality. No calvarial fracture. 2. Stable senescent change and remote left cerebellar and right thalamic infarcts. 3. No acute fracture or listhesis of the cervical spine. 4. Multilevel degenerative disc and degenerative joint disease resulting in moderate to severe right neuroforaminal narrowing at C5-6 and C6-7. Electronically Signed   By: Helyn Numbers M.D.   On: 10/07/2023 20:09   DG Chest 1 View  Result Date: 10/07/2023 CLINICAL DATA:  Hip pain with fall EXAM: CHEST  1 VIEW COMPARISON:  04/17/2019 FINDINGS: Post sternotomy changes. Low lung volumes. No acute airspace disease or pleural effusion. Aortic atherosclerosis. No pneumothorax. IMPRESSION: Low lung volumes. Electronically Signed   By: Jasmine Pang M.D.   On: 10/07/2023 19:51   DG Hip Unilat W or Wo Pelvis 2-3 Views Right  Result Date: 10/07/2023 CLINICAL DATA:  Right hip pain after a fall. EXAM: DG HIP (WITH OR WITHOUT PELVIS) 2-3V RIGHT COMPARISON:  None Available. FINDINGS: Suspicion of subcapital femoral neck fracture on the right. Limited detail because of patient's size. Consider CT for confirmation. No other pelvic fracture. Previous hip replacement on the left. IMPRESSION: Suspicion of subcapital femoral neck fracture on the right. Consider CT for confirmation. Electronically Signed   By: Paulina Fusi M.D.   On: 10/07/2023 19:50     Assessment and Plan:   Pre-Op Evaluation Patient presented after a mechanical fall and was found to have a right femoral neck fracture.  Per Revised Cardiac Risk Index, considered Class IV risk (given history of CAD,  CHF, CVA, and insulin dependent diabetes) with a 15% risk of adverse cardiac event perioperatively and this does not take into account her advanced age. Despite this she is well compensated from her CAD and HF.  Currently in rhythm.  Given the time sensitivity of this procedure, reasonable to proceed; I reviewed her cardiac risks with her daughter (a Engineer, civil (consulting)) and her son and as well, she has her hearing aids in but feels nauseous post nerve block - she is notable nauseas after the nerve block and may need specific pre-operative pain management; defer to anesthesia team  CAD s/p CABG Has a history of CAD with prior CABG in 2006.  Recent cardiac PET stress test showed a small area of apical lateral ischemia.  Overall considered intermediate risk.  EKG this admission shows normal sinus rhythm with chronic LBBB. - asymptomatic - Continue antianginals: Imdur 15mg  daily and Coreg 6.25mg  twice daily. - No aspirin due to need for anticoagulation. - Continue statin.  Chronic Diastolic CHF Last Echo in 01/2022 showed LVEF of 55-60% with grade 1 diastolic dysfunction and severe mitral annular  calcification.  - Euvolemic on exam.  - Heart murmurs suggestive of both aortic sclerosis and degenerative, likely mild, mitral stenosis; continue beta blocker as above - Home Spironolactone stopped due to hyperkalemia.   Paroxysmal Atrial Fibrillation Maintaining sinus rhythm. - Continue Coreg 6.25mg  twice daily. - On chronic anticoagulation with Pradaxa at home. This is currently on hold in anticipation of Ortho surgery tomorrow. Please restart as soon as safely possible postoperatively.  Hypertension BP mostly well controlled.  - Continue Coreg and Imdur as above. - Continue to hold hold Spironolactone and Ramipril given hyperkalemia.  Hyperlipidemia - Continue Crestor 10mg  daily.  CKD Stage IIIa Creatinine 0.95 which is around baseline.  Hyperkalemia Potassium 5.3 today. - Agree with stopping Spironolactone. - Continue to monitor.    Otherwise, per primary team: - Femoral neck fracture - Type 2 diabetes mellitus - Migraines - History of CVA  Risk Assessment/Risk Scores:      New York Heart Association (NYHA) Functional Class NYHA Class I  CHA2DS2-VASc Score = 9   This indicates a 12.2% annual risk of stroke. The patient's score is based upon: CHF History: 1 HTN History: 1 Diabetes History: 1 Stroke History: 2 Vascular Disease History: 1 Age Score: 2 Gender Score: 1       For questions or updates, please contact [facility] Please consult [email or web address] for contact info under    Riley Lam, MD FASE [facility] Cardiologist [facility]  [address] [place], [address] [place], [place] [address] [phone]  2:48 PM

## 2023-10-08 NOTE — Progress Notes (Signed)
Progress Note   Patient: [name] ZOX:[id number] DOB: 1934-06-20 DOA: 10/07/2023     1 DOS: the patient was seen and examined on 10/08/2023   Brief hospital course: 89 year old woman PMH including atrial fibrillation on Pradaxa, CABG, rheumatoid arthritis, presented after a mechanical fall resulting in right hip pain.  Imaging revealed hip fracture.  Consultants Orthopedics  Cardiology   Procedures     Assessment and Plan: Right hip fracture status post mechanical fall Seen by orthopedics with plan for surgery tomorrow.  Continue hip fracture protocol. Holding Pradaxa.   Cleared for surgery by cardiology, with recommendation to proceed with surgery.    CAD  PMH CABG in 2006  Had NM PET on 08/26/23 for episodes of chest heaviness and there was a small area of apical lateral ischemia  Seen by cardiology Continue Coreg, Imdur, Crestor   Spironolactone held secondary to hyperkalemia.   Atrial fibrillation  Holding anticoagulation.  Continue carvedilol.   Insulin-dependent DM  CBG stable.  Continue Semglee, sliding scale insulin, meal coverage   Chronic diastolic CHF   Appears compensated.  Continue carvedilol.  Monitor volume status.  Aldactone on hold as above.   CKD 3A   Hyperkalemia  Stop spironolactone for hyperkalemia    Subjective:  Has right hip pain Breathing ok  Physical Exam: Vitals:   10/08/23 1330 10/08/23 1345 10/08/23 1355 10/08/23 1612  BP: (!) 128/53 (!) 106/51 (!) 137/58 (!) 107/47  Pulse: 85 80 81 88  Resp: 17 16 15 16   Temp:    98.5 F (36.9 C)  TempSrc:      SpO2: 95% 95% 96% (!) 83%  Weight:      Height:       Physical Exam Vitals reviewed.  Constitutional:      General: She is not in acute distress.    Appearance: She is not ill-appearing or toxic-appearing.  Cardiovascular:     Rate and Rhythm: Normal rate and regular rhythm.     Heart sounds: No murmur heard. Pulmonary:     Effort: Pulmonary effort is normal. No  respiratory distress.     Breath sounds: No wheezing, rhonchi or rales.  Neurological:     Mental Status: She is alert.  Psychiatric:        Mood and Affect: Mood normal.        Behavior: Behavior normal.     Data Reviewed: CBG stable K+ 5.3 CBC WNL HgbA1c 7.3  Family Communication:   Disposition: Status is: Inpatient Remains inpatient appropriate because: hip fx     Time spent: 35 minutes  Author: Brendia Sacks, MD 10/08/2023 7:11 PM  For on call review [email or web address].

## 2023-10-08 NOTE — Anesthesia Preprocedure Evaluation (Addendum)
Anesthesia Evaluation  Patient identified by MRN, date of birth, ID band Patient awake    Reviewed: Allergy & Precautions, NPO status , Patient's Chart, lab work & pertinent test results  Airway Mallampati: II  TM Distance: >3 FB Neck ROM: Full    Dental no notable dental hx. (+) Teeth Intact, Dental Advisory Given   Pulmonary    Pulmonary exam normal breath sounds clear to auscultation       Cardiovascular hypertension, + CAD  Normal cardiovascular exam+ dysrhythmias (oN pRADAXA) Atrial Fibrillation  Rhythm:Regular Rate:Normal  01/2022 ECHO  1. Left ventricular ejection fraction, by estimation, is 55 to 60%. The  left ventricle has normal function. Left ventricular diastolic parameters  are consistent with Grade I diastolic dysfunction (impaired relaxation).   2. Right ventricular systolic function is normal. The right ventricular  size is normal. Tricuspid regurgitation signal is inadequate for assessing  PA pressure.   3. Severe mitral annular calcification.   4. Aortic valve regurgitation is not visualized. Aortic valve  sclerosis/calcification is present, without any evidence of aortic  stenosis.   5. The inferior vena cava is normal in size with greater than 50%  respiratory variability, suggesting right atrial pressure of 3 mmHg.   Comparison(s): No significant change from prior study. 07/20/20 EF 60-65%.      Neuro/Psych TIACVA    GI/Hepatic   Endo/Other  diabetes    Renal/GU Renal disease     Musculoskeletal  (+) Arthritis , Rheumatoid disorders,    Abdominal  (+) + obese (BMI 38.1)  Peds  Hematology   Anesthesia Other Findings All: See list  Reproductive/Obstetrics                             Anesthesia Physical Anesthesia Plan  ASA: 3  Anesthesia Plan: Regional   Post-op Pain Management:    Induction:   PONV Risk Score and Plan: Treatment may vary due to age or  medical condition  Airway Management Planned: Nasal Cannula and Natural Airway  Additional Equipment: None  Intra-op Plan:   Post-operative Plan:   Informed Consent: I have reviewed the patients History and Physical, chart, labs and discussed the procedure including the risks, benefits and alternatives for the proposed anesthesia with the patient or authorized representative who has indicated his/her understanding and acceptance.       Plan Discussed with: Anesthesiologist  Anesthesia Plan Comments: (R Hip Fx for Nerve block for pain maganement)       Anesthesia Quick Evaluation

## 2023-10-08 NOTE — TOC Initial Note (Signed)
Transition of Care [facility]) - Initial/Assessment Note   Patient Details  Name: [name] MRN: [id number] Date of Birth: Oct 31, 1934  Transition [facility]) CM/SW Contact:    Ewing Schlein, LCSW Phone Number: 10/08/2023, 10:17 AM  Clinical Narrative: Patient is expected to have surgery tomorrow. Awaiting PT evaluation following surgery. TOC to follow.             Expected Discharge Plan:  (TBD) Barriers to Discharge: Continued Medical Work up  Expected Discharge Plan and Services In-house Referral: Clinical Social Work Living arrangements for the past 2 months: Single Family Home  Prior Living Arrangements/Services Living arrangements for the past 2 months: Single Family Home Lives with:: Spouse Patient language and need for interpreter reviewed:: Yes Need for Family Participation in Patient Care: Yes (Comment) Care giver support system in place?: Yes (comment) Criminal Activity/Legal Involvement Pertinent to Current Situation/Hospitalization: No - Comment as needed  Activities of Daily Living ADL Screening (condition at time of admission) Independently performs ADLs?: Yes (appropriate for developmental age) Is the patient deaf or have difficulty hearing?: Yes Does the patient have difficulty seeing, even when wearing glasses/contacts?: No Does the patient have difficulty concentrating, remembering, or making decisions?: No  Emotional Assessment Orientation: : Oriented to Self, Oriented to Place, Oriented to Situation, Oriented to  Time Alcohol / Substance Use: Not Applicable Psych Involvement: No (comment)  Admission diagnosis:  Fall, initial encounter [W19.XXXA] Closed fracture of right hip, initial encounter (HCC) [S72.001A] Closed right hip fracture, initial encounter [facility]) [S72.001A] Patient Active Problem List   Diagnosis Date Noted   Closed right hip fracture, initial encounter (HCC) 10/07/2023   CKD stage 3a, GFR 45-59 ml/min (HCC) 10/07/2023   History of  stroke 10/07/2023   1st degree AV block 02/15/2022   Dizziness 02/15/2022   Syncope 02/15/2022   S/p left hip fracture 04/17/2019   Rheumatoid arthritis (HCC) 04/17/2019   DM (diabetes mellitus), type 2 (HCC) 04/17/2019   OA (osteoarthritis) of knee 09/11/2015   BPPV (benign paroxysmal positional vertigo) 10/31/2014   Complicated migraine 08/05/2014   TIA (transient ischemic attack) 08/04/2014   Coronary artery disease involving coronary bypass graft of native heart with angina pectoris (HCC) 11/02/2013    Class: Chronic   PAF (paroxysmal atrial fibrillation) (HCC) 11/02/2013    Class: Chronic   Essential hypertension, benign 11/02/2013   Long term current use of anticoagulant therapy 11/02/2013    Class: Acute   PCP:  Emilio Aspen, MD Pharmacy:   [facility] [place], [place] - [address] [place] [address] Phone: [phone] Fax: [phone]  Social Determinants of Health (SDOH) Social History: SDOH Screenings   Food Insecurity: No Food Insecurity (10/07/2023)  Housing: Low Risk  (10/07/2023)  Transportation Needs: No Transportation Needs (10/07/2023)  Utilities: Not At Risk (10/07/2023)  Tobacco Use: Low Risk  (10/07/2023)   SDOH Interventions:    Readmission Risk Interventions     No data to display

## 2023-10-08 NOTE — Progress Notes (Signed)
Initial Nutrition Assessment  DOCUMENTATION CODES:   Obesity unspecified  INTERVENTION:  - Heart Healthy diet per MD.  - Ensure Plus High Protein po BID, each supplement provides 350 kcal and 20 grams of protein. - Multivitamin with minerals daily  NUTRITION DIAGNOSIS:   Increased nutrient needs related to hip fracture as evidenced by estimated needs.  GOAL:   Patient will meet greater than or equal to 90% of their needs  MONITOR:   PO intake, Supplement acceptance, Weight trends  REASON FOR ASSESSMENT:   Consult Hip fracture protocol  ASSESSMENT:   89 y.o. female withPMH significant for HTN, HLD, DM2, CAD s/p CABG, atrial fibrillation, history of CVA, rheumatoid arthritis, and CKD 3A who presented with a right hip fracture after a fall at home.   Patient endorses a UBW of around 192# and a little weight loss over the past year due to eating less overall.  Per EMR, weight without significant changes in the past year.   Patient reports eating 3 meals a day at home. Has been eating normally but admits to decreased appetite at baseline.  Current appetite is also decreased but patient is documented to have consumed 75% of breakfast this AM. She is agreeable to try Ensure to support intake for healing.    Medications reviewed and include: Insulin  Labs reviewed:  K+ 5.3 HA1C 7.3 Blood Glucsoe 154-179 since admit yesterday   NUTRITION - FOCUSED PHYSICAL EXAM:  Flowsheet Row Most Recent Value  Orbital Region No depletion  Upper Arm Region No depletion  Thoracic and Lumbar Region No depletion  Buccal Region No depletion  Temple Region No depletion  Clavicle Bone Region No depletion  Clavicle and Acromion Bone Region No depletion  Scapular Bone Region Unable to assess  Dorsal Hand Mild depletion  Patellar Region No depletion  Anterior Thigh Region No depletion  Posterior Calf Region No depletion  Edema (RD Assessment) None  Hair Reviewed  Eyes Reviewed  Mouth  Reviewed  Skin Reviewed  Nails Reviewed       Diet Order:   Diet Order             Diet Heart Room service appropriate? Yes; Fluid consistency: Thin  Diet effective now                   EDUCATION NEEDS:  Education needs have been addressed  Skin:  Skin Assessment: Reviewed RN Assessment  Last BM:  10/8  Height:  Ht Readings from Last 1 Encounters:  10/07/23 5' (1.524 m)   Weight:  Wt Readings from Last 1 Encounters:  10/07/23 88.5 kg   BMI:  Body mass index is 38.1 kg/m.  Estimated Nutritional Needs:  Kcal:  1500-1750 kcals Protein:  90-105 grams Fluid:  >/= 1.5L    Shelle Iron RD, LDN For contact information, refer to [facility].

## 2023-10-08 NOTE — Consult Note (Signed)
Reason for Consult:Right hip pain Referring Physician: Dr Doree Albee is an 89 y.o. female.  HPI: [name] is an 89 yo female who had a mechanical fall last night with immediate right hip pain and inability to get up. She did not have any dizziness, light headedness or prodromal symptoms associated with the fall. She states that she has balance problems and that led to her fall last night. She did not hit her head or have a loss of consciousness. Her right hip is her only area of pain.   Past Medical History:  Diagnosis Date   Anginal pain (HCC)    "saw doctor-think it was esophagus spasm"   Arthritis    Atrial flutter (HCC) 11/02/2013   Cataract    left eye   Coronary artery disease    Diabetes mellitus without complication (HCC)    type 2   Essential hypertension, benign 11/02/2013   H/O jaundice    as child   Headache 2015   complicated migraine x2    Hearing trouble    Heart disease    Hepatitis    hx of hepatitis A as child   High cholesterol    under control   Long term (current) use of anticoagulants 11/02/2013   Pradaxa    Measles    as child   Mouth dryness    Mumps    as teenager   Numbness and tingling    lips   Osteoporosis    "unsure-maybe in knees"   Psoriasis    Psoriatic arthritis (HCC)    Stroke (HCC) 2012   Vertigo    being monitored, occasional    Past Surgical History:  Procedure Laterality Date   APPENDECTOMY  as child   CARDIAC CATHETERIZATION  ~2006   clear   CORONARY ARTERY BYPASS GRAFT  02/16/2005   LUMBAR DISC SURGERY  09/2004   L1-L5   TONSILLECTOMY  as child   with adenoids   TOTAL HIP ARTHROPLASTY Left 04/19/2019   Procedure: TOTAL HIP ARTHROPLASTY ANTERIOR APPROACH;  Surgeon: Ollen Gross, MD;  Location: [facility];  Service: Orthopedics;  Laterality: Left;   TOTAL KNEE ARTHROPLASTY Left 09/11/2015   Procedure: LEFT TOTAL KNEE ARTHROPLASTY;  Surgeon: Ollen Gross, MD;  Location: [facility];  Service: Orthopedics;   Laterality: Left;    Family History  Problem Relation Age of Onset   Heart failure Mother    Neuromuscular disorder Father     Social History:  reports that she has never smoked. She has never used smokeless tobacco. She reports that she does not drink alcohol and does not use drugs.  Allergies:  Allergies  Allergen Reactions   Codeine Nausea And Vomiting   Penicillins Swelling and Other (See Comments)    Did it involve swelling of the face/tongue/throat, SOB, or low BP? Yes Did it involve sudden or severe rash/hives, skin peeling, or any reaction on the inside of your mouth or nose? Unknown Did you need to seek medical attention at a hospital or doctor's office? Unknown When did it last happen?  Unknown If all above answers are "NO", may proceed with cephalosporin use.  Swelling of tongue   Acetaminophen-Codeine     Other reaction(s): Up set stomach   Atenolol Other (See Comments)   Atorvastatin     Muscle soreness   Chloramphenicol Other (See Comments)   Dapagliflozin     Other reaction(s): yeast infections   Empagliflozin     Other reaction(s): yeast infections  Liraglutide     Other reaction(s): constipation, bloating, nausea   Metformin Hcl Other (See Comments)   Metoprolol Other (See Comments)   Zetia [Ezetimibe] Other (See Comments)   Sertraline Palpitations    Medications: I have reviewed the patient's current medications.  Results for orders placed or performed during the hospital encounter of 10/07/23 (from the past 48 hour(s))  CBC with Differential     Status: None   Collection Time: 10/07/23  7:17 PM  Result Value Ref Range   WBC 6.7 4.0 - 10.5 K/uL   RBC 4.19 3.87 - 5.11 MIL/uL   Hemoglobin 12.7 12.0 - 15.0 g/dL   HCT 16.1 09.6 - 04.5 %   MCV 93.3 80.0 - 100.0 fL   MCH 30.3 26.0 - 34.0 pg   MCHC 32.5 30.0 - 36.0 g/dL   RDW 40.9 81.1 - 91.4 %   Platelets 156 150 - 400 K/uL   nRBC 0.0 0.0 - 0.2 %   Neutrophils Relative % 48 %   Neutro Abs 3.2 1.7  - 7.7 K/uL   Lymphocytes Relative 41 %   Lymphs Abs 2.7 0.7 - 4.0 K/uL   Monocytes Relative 8 %   Monocytes Absolute 0.6 0.1 - 1.0 K/uL   Eosinophils Relative 3 %   Eosinophils Absolute 0.2 0.0 - 0.5 K/uL   Basophils Relative 0 %   Basophils Absolute 0.0 0.0 - 0.1 K/uL   Immature Granulocytes 0 %   Abs Immature Granulocytes 0.02 0.00 - 0.07 K/uL    Comment: Performed at [facility], [address] [place], [place] [address]  Basic metabolic panel     Status: Abnormal   Collection Time: 10/07/23  7:17 PM  Result Value Ref Range   Sodium 138 135 - 145 mmol/L   Potassium 4.6 3.5 - 5.1 mmol/L   Chloride 109 98 - 111 mmol/L   CO2 22 22 - 32 mmol/L   Glucose, Bld 116 (H) 70 - 99 mg/dL    Comment: Glucose reference range applies only to samples taken after fasting for at least 8 hours.   BUN 33 (H) 8 - 23 mg/dL   Creatinine, Ser 6.21 (H) 0.44 - 1.00 mg/dL   Calcium 9.2 8.9 - 30.8 mg/dL   GFR, Estimated 51 (L) >60 mL/min    Comment: (NOTE) Calculated using the CKD-EPI Creatinine Equation (2021)    Anion gap 7 5 - 15    Comment: Performed at [facility], [address], [place], [place] [address]  Protime-INR     Status: Abnormal   Collection Time: 10/07/23  7:17 PM  Result Value Ref Range   Prothrombin Time 26.4 (H) 11.4 - 15.2 seconds   INR 2.4 (H) 0.8 - 1.2    Comment: (NOTE) INR goal varies based on device and disease states. Performed at [facility], [address] [place], [place] [address]   Glucose, capillary     Status: Abnormal   Collection Time: 10/07/23 10:40 PM  Result Value Ref Range   Glucose-Capillary 154 (H) 70 - 99 mg/dL    Comment: Glucose reference range applies only to samples taken after fasting for at least 8 hours.  Basic metabolic panel     Status: Abnormal   Collection Time: 10/08/23  3:56 AM  Result Value Ref Range   Sodium 139 135 - 145 mmol/L   Potassium 5.3 (H) 3.5 - 5.1 mmol/L    Chloride 109 98 - 111 mmol/L  CO2 21 (L) 22 - 32 mmol/L   Glucose, Bld 192 (H) 70 - 99 mg/dL    Comment: Glucose reference range applies only to samples taken after fasting for at least 8 hours.   BUN 29 (H) 8 - 23 mg/dL   Creatinine, Ser 1.61 0.44 - 1.00 mg/dL   Calcium 9.2 8.9 - 09.6 mg/dL   GFR, Estimated 58 (L) >60 mL/min    Comment: (NOTE) Calculated using the CKD-EPI Creatinine Equation (2021)    Anion gap 9 5 - 15    Comment: Performed at [facility], [address] [place], [place] [address]  CBC     Status: None   Collection Time: 10/08/23  3:56 AM  Result Value Ref Range   WBC 9.3 4.0 - 10.5 K/uL   RBC 3.97 3.87 - 5.11 MIL/uL   Hemoglobin 12.1 12.0 - 15.0 g/dL   HCT 98.1 19.1 - 47.8 %   MCV 99.7 80.0 - 100.0 fL   MCH 30.5 26.0 - 34.0 pg   MCHC 30.6 30.0 - 36.0 g/dL   RDW 29.5 62.1 - 30.8 %   Platelets PLATELET CLUMPS NOTED ON SMEAR, UNABLE TO ESTIMATE 150 - 400 K/uL   nRBC 0.0 0.0 - 0.2 %    Comment: Performed at [facility], [address], [place], [place] [address]  Type and screen [facility]     Status: None   Collection Time: 10/08/23  3:56 AM  Result Value Ref Range   ABO/RH(D) A POS    Antibody Screen NEG    Sample Expiration      10/11/2023,2359 Performed at [facility], [address] [place], [place] [address]     CT HEAD WO CONTRAST ( )  Result Date: 10/07/2023 CLINICAL DATA:  Mental status change, unknown cause dizziness, fall; Neck trauma (Age >= 65y) EXAM: CT HEAD WITHOUT CONTRAST CT CERVICAL SPINE WITHOUT CONTRAST TECHNIQUE: Multidetector CT imaging of the head and cervical spine was performed following the standard protocol without intravenous contrast. Multiplanar CT image reconstructions of the cervical spine were also generated. RADIATION DOSE REDUCTION: This exam was performed according to the departmental dose-optimization program which includes automated  exposure control, adjustment of the mA and/or kV according to patient size and/or use of iterative reconstruction technique. COMPARISON:  None Available. FINDINGS: CT HEAD FINDINGS Brain: Normal anatomic configuration. Parenchymal volume loss is commensurate with the patient's age. Stable mild periventricular white matter changes are present likely reflecting the sequela of small vessel ischemia. Remote left cerebellar and right thalamic infarcts again noted. No abnormal intra or extra-axial mass lesion or fluid collection. No abnormal mass effect or midline shift. No evidence of acute intracranial hemorrhage or infarct. Ventricular size is normal. Cerebellum unremarkable. Vascular: No asymmetric hyperdense vasculature at the skull base. Skull: Intact Sinuses/Orbits: Paranasal sinuses are clear. Orbits are unremarkable. Other: Mastoid air cells and middle ear cavities are clear. CT CERVICAL SPINE FINDINGS Alignment: Normal. Skull base and vertebrae: Craniocervical alignment is normal. The atlantodental interval is not widened. No acute fracture of the cervical spine. Vertebral body height is preserved. Ankylosis of the facet joints of C3-4 noted bilaterally. Soft tissues and spinal canal: No prevertebral fluid or swelling. No visible canal hematoma. Disc levels: Intervertebral disc space narrowing and endplate remodeling at C5-C7 is present in keeping with changes of moderate degenerative disc disease. Prevertebral soft tissues are not thickened on sagittal reformats. Spinal canal is widely patent. Uncovertebral arthrosis results in moderate  to severe right neuroforaminal narrowing at C5-6 and C6-7. Upper chest: Negative. Other: None IMPRESSION: 1. No acute intracranial abnormality. No calvarial fracture. 2. Stable senescent change and remote left cerebellar and right thalamic infarcts. 3. No acute fracture or listhesis of the cervical spine. 4. Multilevel degenerative disc and degenerative joint disease resulting  in moderate to severe right neuroforaminal narrowing at C5-6 and C6-7. Electronically Signed   By: Helyn Numbers M.D.   On: 10/07/2023 20:09   CT Cervical Spine Wo Contrast  Result Date: 10/07/2023 CLINICAL DATA:  Mental status change, unknown cause dizziness, fall; Neck trauma (Age >= 65y) EXAM: CT HEAD WITHOUT CONTRAST CT CERVICAL SPINE WITHOUT CONTRAST TECHNIQUE: Multidetector CT imaging of the head and cervical spine was performed following the standard protocol without intravenous contrast. Multiplanar CT image reconstructions of the cervical spine were also generated. RADIATION DOSE REDUCTION: This exam was performed according to the departmental dose-optimization program which includes automated exposure control, adjustment of the mA and/or kV according to patient size and/or use of iterative reconstruction technique. COMPARISON:  None Available. FINDINGS: CT HEAD FINDINGS Brain: Normal anatomic configuration. Parenchymal volume loss is commensurate with the patient's age. Stable mild periventricular white matter changes are present likely reflecting the sequela of small vessel ischemia. Remote left cerebellar and right thalamic infarcts again noted. No abnormal intra or extra-axial mass lesion or fluid collection. No abnormal mass effect or midline shift. No evidence of acute intracranial hemorrhage or infarct. Ventricular size is normal. Cerebellum unremarkable. Vascular: No asymmetric hyperdense vasculature at the skull base. Skull: Intact Sinuses/Orbits: Paranasal sinuses are clear. Orbits are unremarkable. Other: Mastoid air cells and middle ear cavities are clear. CT CERVICAL SPINE FINDINGS Alignment: Normal. Skull base and vertebrae: Craniocervical alignment is normal. The atlantodental interval is not widened. No acute fracture of the cervical spine. Vertebral body height is preserved. Ankylosis of the facet joints of C3-4 noted bilaterally. Soft tissues and spinal canal: No prevertebral fluid or  swelling. No visible canal hematoma. Disc levels: Intervertebral disc space narrowing and endplate remodeling at C5-C7 is present in keeping with changes of moderate degenerative disc disease. Prevertebral soft tissues are not thickened on sagittal reformats. Spinal canal is widely patent. Uncovertebral arthrosis results in moderate to severe right neuroforaminal narrowing at C5-6 and C6-7. Upper chest: Negative. Other: None IMPRESSION: 1. No acute intracranial abnormality. No calvarial fracture. 2. Stable senescent change and remote left cerebellar and right thalamic infarcts. 3. No acute fracture or listhesis of the cervical spine. 4. Multilevel degenerative disc and degenerative joint disease resulting in moderate to severe right neuroforaminal narrowing at C5-6 and C6-7. Electronically Signed   By: Helyn Numbers M.D.   On: 10/07/2023 20:09   DG Chest 1 View  Result Date: 10/07/2023 CLINICAL DATA:  Hip pain with fall EXAM: CHEST  1 VIEW COMPARISON:  04/17/2019 FINDINGS: Post sternotomy changes. Low lung volumes. No acute airspace disease or pleural effusion. Aortic atherosclerosis. No pneumothorax. IMPRESSION: Low lung volumes. Electronically Signed   By: Jasmine Pang M.D.   On: 10/07/2023 19:51   DG Hip Unilat W or Wo Pelvis 2-3 Views Right  Result Date: 10/07/2023 CLINICAL DATA:  Right hip pain after a fall. EXAM: DG HIP (WITH OR WITHOUT PELVIS) 2-3V RIGHT COMPARISON:  None Available. FINDINGS: Suspicion of subcapital femoral neck fracture on the right. Limited detail because of patient's size. Consider CT for confirmation. No other pelvic fracture. Previous hip replacement on the left. IMPRESSION: Suspicion of subcapital femoral neck fracture on the  right. Consider CT for confirmation. Electronically Signed   By: Paulina Fusi M.D.   On: 10/07/2023 19:50    Review of Systems Blood pressure (!) 125/56, pulse (!) 101, temperature 98.4 F (36.9 C), temperature source Oral, resp. rate 18, height 5'  (1.524 m), weight 88.5 kg, SpO2 93%. Physical Exam Physical Examination: General appearance - alert, well appearing, and in no distress Mental status - alert, oriented to person, place, and time Neurological - alert, oriented, normal speech, no focal findings or movement disorder noted Right lower extremity- Tender over right hip with pain on any attempted hip rotation; EHL/FHL/TA/Gastroc intact; sensation intact; foot warm and pink   Assessment/Plan: Right femoral neck fracture- She has a partially displaced fracture with underlying osteoarthritis of the right hip. This will require surgical treatment to allow her to mobilize and ambulate again. Unfortunately I will not be available until next Monday to operate so I will see if one of my partners can do it sooner. Her INR is currently 2.4 so that should be repeated today. She is on Pradaxa so that should be held until surgery.  [name] 10/08/2023, 7:01 AM

## 2023-10-08 NOTE — Progress Notes (Signed)
Asked by Dr. [name] to take over care for surgery. Patient sustained a GLF and has a right subcapital femoral neck fracture with femoral head retroversion and pre-existing DJD. Recommend R THA for pain control and immediate mobilization OOB.  Patient takes Pradaxa for Afib. Last dose was yesterday am.  Plan for surgery tomorrow. NPO after MN tonight.  Hold blood thinners.

## 2023-10-08 NOTE — Anesthesia Procedure Notes (Signed)
Anesthesia Regional Block: Femoral nerve block   Pre-Anesthetic Checklist: , timeout performed,  Correct Patient, Correct Site, Correct Laterality,  Correct Procedure, Correct Position, site marked,  Risks and benefits discussed  Laterality: Right and Lower         Needles:   Needle Type: Echogenic Stimulator Needle     Needle Length: 9cm  Needle Gauge: 20     Additional Needles:   Procedures:,,,, ultrasound used (permanent image in chart),,    Narrative:  Start time: 10/08/2023 1:19 PM End time: 10/08/2023 1:24 PM Injection made incrementally with aspirations every 5 mL.  Performed by: Personally

## 2023-10-09 ENCOUNTER — Other Ambulatory Visit: Payer: Self-pay

## 2023-10-09 ENCOUNTER — Inpatient Hospital Stay (HOSPITAL_COMMUNITY): Payer: Medicare Other

## 2023-10-09 ENCOUNTER — Encounter (HOSPITAL_COMMUNITY): Payer: Self-pay | Admitting: Family Medicine

## 2023-10-09 ENCOUNTER — Inpatient Hospital Stay (HOSPITAL_COMMUNITY): Payer: Medicare Other | Admitting: Anesthesiology

## 2023-10-09 ENCOUNTER — Encounter (HOSPITAL_COMMUNITY)
Admission: EM | Disposition: A | Discharge status: Skilled Nursing Facility | Payer: Self-pay | Source: Home / Self Care | Attending: Family Medicine

## 2023-10-09 DIAGNOSIS — E1169 Type 2 diabetes mellitus with other specified complication: Secondary | ICD-10-CM | POA: Diagnosis not present

## 2023-10-09 DIAGNOSIS — S72001A Fracture of unspecified part of neck of right femur, initial encounter for closed fracture: Secondary | ICD-10-CM

## 2023-10-09 DIAGNOSIS — N1831 Chronic kidney disease, stage 3a: Secondary | ICD-10-CM | POA: Diagnosis not present

## 2023-10-09 DIAGNOSIS — I480 Paroxysmal atrial fibrillation: Secondary | ICD-10-CM | POA: Diagnosis not present

## 2023-10-09 DIAGNOSIS — N189 Chronic kidney disease, unspecified: Secondary | ICD-10-CM

## 2023-10-09 DIAGNOSIS — I129 Hypertensive chronic kidney disease with stage 1 through stage 4 chronic kidney disease, or unspecified chronic kidney disease: Secondary | ICD-10-CM

## 2023-10-09 DIAGNOSIS — E1122 Type 2 diabetes mellitus with diabetic chronic kidney disease: Secondary | ICD-10-CM

## 2023-10-09 HISTORY — PX: TOTAL HIP ARTHROPLASTY: SHX124

## 2023-10-09 LAB — BASIC METABOLIC PANEL
Anion gap: 7 (ref 5–15)
BUN: 27 mg/dL — ABNORMAL HIGH (ref 8–23)
CO2: 23 mmol/L (ref 22–32)
Calcium: 9.1 mg/dL (ref 8.9–10.3)
Chloride: 104 mmol/L (ref 98–111)
Creatinine, Ser: 1.05 mg/dL — ABNORMAL HIGH (ref 0.44–1.00)
GFR, Estimated: 51 mL/min — ABNORMAL LOW (ref >60)
Glucose, Bld: 124 mg/dL — ABNORMAL HIGH (ref 70–99)
Potassium: 4.4 mmol/L (ref 3.5–5.1)
Sodium: 134 mmol/L — ABNORMAL LOW (ref 135–145)

## 2023-10-09 LAB — GLUCOSE, CAPILLARY
Glucose-Capillary: 109 mg/dL — ABNORMAL HIGH (ref 70–99)
Glucose-Capillary: 120 mg/dL — ABNORMAL HIGH (ref 70–99)
Glucose-Capillary: 105 mg/dL — ABNORMAL HIGH (ref 70–99)
Glucose-Capillary: 115 mg/dL — ABNORMAL HIGH (ref 70–99)
Glucose-Capillary: 139 mg/dL — ABNORMAL HIGH (ref 70–99)

## 2023-10-09 LAB — CBC
HCT: 35.1 % — ABNORMAL LOW (ref 36.0–46.0)
Hemoglobin: 11.1 g/dL — ABNORMAL LOW (ref 12.0–15.0)
MCH: 30.1 pg (ref 26.0–34.0)
MCHC: 31.6 g/dL (ref 30.0–36.0)
MCV: 95.1 fL (ref 80.0–100.0)
Platelets: 129 10*3/uL — ABNORMAL LOW (ref 150–400)
RBC: 3.69 MIL/uL — ABNORMAL LOW (ref 3.87–5.11)
RDW: 11.9 % (ref 11.5–15.5)
WBC: 8.2 10*3/uL (ref 4.0–10.5)
nRBC: 0 % (ref 0.0–0.2)

## 2023-10-09 LAB — PROTIME-INR
INR: 1.2 (ref 0.8–1.2)
Prothrombin Time: 15.1 s (ref 11.4–15.2)

## 2023-10-09 SURGERY — ARTHROPLASTY, HIP, TOTAL, ANTERIOR APPROACH
Anesthesia: General | Site: Hip | Laterality: Right

## 2023-10-09 MED ORDER — FENTANYL CITRATE (PF) 100 MCG/2ML IJ SOLN
INTRAMUSCULAR | Status: AC
  Filled 2023-10-09: qty 2

## 2023-10-09 MED ORDER — ACETAMINOPHEN 10 MG/ML IV SOLN
INTRAVENOUS | Status: AC
  Administered 2023-10-09: 1000 mg via INTRAVENOUS
  Filled 2023-10-09: qty 100

## 2023-10-09 MED ORDER — SUGAMMADEX SODIUM 200 MG/2ML IV SOLN
INTRAVENOUS | Status: DC | PRN
Start: 2023-10-09 — End: 2023-10-09
  Administered 2023-10-09: 200 mg via INTRAVENOUS

## 2023-10-09 MED ORDER — CHLORHEXIDINE GLUCONATE 0.12 % MT SOLN
15.0000 mL | Freq: Once | OROMUCOSAL | Status: AC
  Administered 2023-10-09: 15 mL via OROMUCOSAL

## 2023-10-09 MED ORDER — VANCOMYCIN HCL IN DEXTROSE 1-5 GM/200ML-% IV SOLN
1000.0000 mg | Freq: Two times a day (BID) | INTRAVENOUS | Status: AC
  Administered 2023-10-09: 1000 mg via INTRAVENOUS
  Filled 2023-10-09 (×2): qty 200

## 2023-10-09 MED ORDER — OXYCODONE HCL 5 MG PO TABS
10.0000 mg | ORAL_TABLET | ORAL | Status: DC | PRN
  Administered 2023-10-11: 15 mg via ORAL
  Administered 2023-10-11: 10 mg via ORAL
  Filled 2023-10-09: qty 3
  Filled 2023-10-09: qty 2

## 2023-10-09 MED ORDER — HYDROMORPHONE HCL 1 MG/ML IJ SOLN: 0.5000 mg | INTRAMUSCULAR | Status: DC | PRN

## 2023-10-09 MED ORDER — TRANEXAMIC ACID-NACL 1000-0.7 MG/100ML-% IV SOLN
1000.0000 mg | INTRAVENOUS | Status: DC
  Filled 2023-10-09: qty 100

## 2023-10-09 MED ORDER — PHENYLEPHRINE HCL-NACL 20-0.9 MG/250ML-% IV SOLN
INTRAVENOUS | Status: DC | PRN
Start: 2023-10-09 — End: 2023-10-09
  Administered 2023-10-09: 40 ug/min via INTRAVENOUS

## 2023-10-09 MED ORDER — DEXAMETHASONE SODIUM PHOSPHATE 10 MG/ML IJ SOLN
INTRAMUSCULAR | Status: DC | PRN
Start: 2023-10-09 — End: 2023-10-09
  Administered 2023-10-09: 10 mg via INTRAVENOUS

## 2023-10-09 MED ORDER — SODIUM CHLORIDE (PF) 0.9 % IJ SOLN
INTRAMUSCULAR | Status: DC | PRN
  Administered 2023-10-09: 30 mL

## 2023-10-09 MED ORDER — TRANEXAMIC ACID-NACL 1000-0.7 MG/100ML-% IV SOLN
INTRAVENOUS | Status: DC | PRN
Start: 2023-10-09 — End: 2023-10-09
  Administered 2023-10-09: 1000 mg via INTRAVENOUS

## 2023-10-09 MED ORDER — OXYCODONE HCL 5 MG PO TABS
5.0000 mg | ORAL_TABLET | ORAL | 0 refills | Status: DC | PRN
Start: 2023-10-09 — End: 2023-10-20

## 2023-10-09 MED ORDER — PHENYLEPHRINE 80 MCG/ML (10ML) SYRINGE FOR IV PUSH (FOR BLOOD PRESSURE SUPPORT)
PREFILLED_SYRINGE | INTRAVENOUS | Status: AC
  Filled 2023-10-09: qty 10

## 2023-10-09 MED ORDER — PROPOFOL 10 MG/ML IV BOLUS
INTRAVENOUS | Status: DC | PRN
  Administered 2023-10-09: 100 mg via INTRAVENOUS

## 2023-10-09 MED ORDER — BUPIVACAINE-EPINEPHRINE 0.25% -1:200000 IJ SOLN
INTRAMUSCULAR | Status: AC
  Filled 2023-10-09: qty 1

## 2023-10-09 MED ORDER — ONDANSETRON HCL 4 MG/2ML IJ SOLN: 4.0000 mg | Freq: Once | INTRAMUSCULAR | Status: DC | PRN

## 2023-10-09 MED ORDER — AMISULPRIDE (ANTIEMETIC) 5 MG/2ML IV SOLN
INTRAVENOUS | Status: AC
  Filled 2023-10-09: qty 4

## 2023-10-09 MED ORDER — KETOROLAC TROMETHAMINE 30 MG/ML IJ SOLN
INTRAMUSCULAR | Status: AC
  Filled 2023-10-09: qty 1

## 2023-10-09 MED ORDER — LIDOCAINE HCL (PF) 2 % IJ SOLN
INTRAMUSCULAR | Status: AC
  Filled 2023-10-09: qty 5

## 2023-10-09 MED ORDER — DIPHENHYDRAMINE HCL 12.5 MG/5ML PO ELIX: 12.5000 mg | ORAL_SOLUTION | ORAL | Status: DC | PRN

## 2023-10-09 MED ORDER — ACETAMINOPHEN 325 MG PO TABS
325.0000 mg | ORAL_TABLET | Freq: Four times a day (QID) | ORAL | Status: DC | PRN
  Administered 2023-10-11: 650 mg via ORAL
  Filled 2023-10-09: qty 2

## 2023-10-09 MED ORDER — ACETAMINOPHEN 500 MG PO TABS
1000.0000 mg | ORAL_TABLET | Freq: Once | ORAL | Status: DC
  Filled 2023-10-09: qty 2

## 2023-10-09 MED ORDER — POVIDONE-IODINE 10 % EX SWAB: 2.0000 | Freq: Once | CUTANEOUS | Status: DC

## 2023-10-09 MED ORDER — CEFAZOLIN SODIUM-DEXTROSE 2-4 GM/100ML-% IV SOLN
INTRAVENOUS | Status: AC
  Filled 2023-10-09: qty 100

## 2023-10-09 MED ORDER — ALUM & MAG HYDROXIDE-SIMETH 200-200-20 MG/5ML PO SUSP: 30.0000 mL | ORAL | Status: DC | PRN

## 2023-10-09 MED ORDER — OXYCODONE HCL 5 MG PO TABS
5.0000 mg | ORAL_TABLET | ORAL | Status: DC | PRN
  Administered 2023-10-10 – 2023-10-11 (×2): 10 mg via ORAL
  Administered 2023-10-11 – 2023-10-12 (×3): 5 mg via ORAL
  Filled 2023-10-09: qty 2
  Filled 2023-10-09: qty 1
  Filled 2023-10-09 (×2): qty 2
  Filled 2023-10-09: qty 1

## 2023-10-09 MED ORDER — LIDOCAINE HCL (CARDIAC) PF 100 MG/5ML IV SOSY
PREFILLED_SYRINGE | INTRAVENOUS | Status: DC | PRN
  Administered 2023-10-09: 60 mg via INTRAVENOUS

## 2023-10-09 MED ORDER — BUPIVACAINE-EPINEPHRINE 0.25% -1:200000 IJ SOLN
INTRAMUSCULAR | Status: DC | PRN
  Administered 2023-10-09: 30 mL

## 2023-10-09 MED ORDER — ACETAMINOPHEN 500 MG PO TABS
1000.0000 mg | ORAL_TABLET | Freq: Four times a day (QID) | ORAL | Status: AC
  Administered 2023-10-10 (×3): 1000 mg via ORAL
  Filled 2023-10-09 (×3): qty 2

## 2023-10-09 MED ORDER — LACTATED RINGERS IV SOLN: INTRAVENOUS | Status: DC | PRN

## 2023-10-09 MED ORDER — WATER FOR IRRIGATION, STERILE IR SOLN
Status: DC | PRN
  Administered 2023-10-09: 2000 mL

## 2023-10-09 MED ORDER — ACETAMINOPHEN 10 MG/ML IV SOLN: 1000.0000 mg | Freq: Once | INTRAVENOUS | Status: DC | PRN

## 2023-10-09 MED ORDER — SODIUM CHLORIDE (PF) 0.9 % IJ SOLN
INTRAMUSCULAR | Status: AC
  Filled 2023-10-09: qty 30

## 2023-10-09 MED ORDER — PHENOL 1.4 % MT LIQD: 1.0000 | OROMUCOSAL | Status: DC | PRN

## 2023-10-09 MED ORDER — FENTANYL CITRATE PF 50 MCG/ML IJ SOSY
PREFILLED_SYRINGE | INTRAMUSCULAR | Status: AC
  Administered 2023-10-09: 25 ug via INTRAVENOUS
  Filled 2023-10-09: qty 2

## 2023-10-09 MED ORDER — METHOCARBAMOL 500 MG IVPB - SIMPLE MED: 500.0000 mg | Freq: Four times a day (QID) | INTRAVENOUS | Status: DC | PRN

## 2023-10-09 MED ORDER — MENTHOL 3 MG MT LOZG: 1.0000 | LOZENGE | OROMUCOSAL | Status: DC | PRN

## 2023-10-09 MED ORDER — PROPOFOL 10 MG/ML IV BOLUS
INTRAVENOUS | Status: AC
  Filled 2023-10-09: qty 20

## 2023-10-09 MED ORDER — DEXAMETHASONE SODIUM PHOSPHATE 10 MG/ML IJ SOLN
INTRAMUSCULAR | Status: AC
  Filled 2023-10-09: qty 1

## 2023-10-09 MED ORDER — METHOCARBAMOL 500 MG PO TABS: 500.0000 mg | ORAL_TABLET | Freq: Four times a day (QID) | ORAL | Status: DC | PRN

## 2023-10-09 MED ORDER — ISOPROPYL ALCOHOL 70 % SOLN
Status: AC
  Filled 2023-10-09: qty 480

## 2023-10-09 MED ORDER — EPHEDRINE 5 MG/ML INJ
INTRAVENOUS | Status: AC
  Filled 2023-10-09: qty 5

## 2023-10-09 MED ORDER — METHOCARBAMOL 500 MG IVPB - SIMPLE MED
INTRAVENOUS | Status: AC
  Filled 2023-10-09: qty 55

## 2023-10-09 MED ORDER — AMISULPRIDE (ANTIEMETIC) 5 MG/2ML IV SOLN
10.0000 mg | Freq: Once | INTRAVENOUS | Status: AC | PRN
  Administered 2023-10-09: 10 mg via INTRAVENOUS

## 2023-10-09 MED ORDER — VANCOMYCIN HCL 1500 MG/300ML IV SOLN
INTRAVENOUS | Status: AC
  Filled 2023-10-09: qty 300

## 2023-10-09 MED ORDER — ONDANSETRON HCL 4 MG/2ML IJ SOLN
INTRAMUSCULAR | Status: AC
  Filled 2023-10-09: qty 2

## 2023-10-09 MED ORDER — VANCOMYCIN HCL 1000 MG IV SOLR
INTRAVENOUS | Status: DC | PRN
  Administered 2023-10-09: 1000 mg via INTRAVENOUS

## 2023-10-09 MED ORDER — SODIUM CHLORIDE 0.9 % IV SOLN: INTRAVENOUS | Status: DC

## 2023-10-09 MED ORDER — FENTANYL CITRATE PF 50 MCG/ML IJ SOSY: 25.0000 ug | PREFILLED_SYRINGE | INTRAMUSCULAR | Status: DC | PRN

## 2023-10-09 MED ORDER — POLYETHYLENE GLYCOL 3350 17 G PO PACK: 17.0000 g | PACK | Freq: Every day | ORAL | Status: DC | PRN

## 2023-10-09 MED ORDER — SENNA 8.6 MG PO TABS
1.0000 | ORAL_TABLET | Freq: Two times a day (BID) | ORAL | Status: DC
  Administered 2023-10-09 – 2023-10-13 (×8): 8.6 mg via ORAL
  Filled 2023-10-09 (×8): qty 1

## 2023-10-09 MED ORDER — KETOROLAC TROMETHAMINE 30 MG/ML IJ SOLN
INTRAMUSCULAR | Status: DC | PRN
  Administered 2023-10-09: 30 mg

## 2023-10-09 MED ORDER — METOCLOPRAMIDE HCL 5 MG/ML IJ SOLN: 5.0000 mg | Freq: Three times a day (TID) | INTRAMUSCULAR | Status: DC | PRN

## 2023-10-09 MED ORDER — ONDANSETRON HCL 4 MG PO TABS
4.0000 mg | ORAL_TABLET | Freq: Four times a day (QID) | ORAL | Status: DC | PRN
  Administered 2023-10-09 – 2023-10-12 (×5): 4 mg via ORAL
  Filled 2023-10-09 (×5): qty 1

## 2023-10-09 MED ORDER — ROCURONIUM BROMIDE 100 MG/10ML IV SOLN
INTRAVENOUS | Status: DC | PRN
  Administered 2023-10-09: 20 mg via INTRAVENOUS
  Administered 2023-10-09: 40 mg via INTRAVENOUS
  Administered 2023-10-09 (×2): 20 mg via INTRAVENOUS

## 2023-10-09 MED ORDER — ONDANSETRON HCL 4 MG/2ML IJ SOLN
INTRAMUSCULAR | Status: DC | PRN
  Administered 2023-10-09: 4 mg via INTRAVENOUS

## 2023-10-09 MED ORDER — ISOPROPYL ALCOHOL 70 % SOLN
Status: DC | PRN
  Administered 2023-10-09: 1 via TOPICAL

## 2023-10-09 MED ORDER — ROCURONIUM BROMIDE 10 MG/ML (PF) SYRINGE
PREFILLED_SYRINGE | INTRAVENOUS | Status: AC
  Filled 2023-10-09: qty 10

## 2023-10-09 MED ORDER — DOCUSATE SODIUM 100 MG PO CAPS
100.0000 mg | ORAL_CAPSULE | Freq: Two times a day (BID) | ORAL | Status: DC
  Administered 2023-10-09 – 2023-10-13 (×8): 100 mg via ORAL
  Filled 2023-10-09 (×8): qty 1

## 2023-10-09 MED ORDER — CHLORHEXIDINE GLUCONATE 4 % EX SOLN: 60.0000 mL | Freq: Once | CUTANEOUS | Status: DC

## 2023-10-09 MED ORDER — ONDANSETRON HCL 4 MG/2ML IJ SOLN
4.0000 mg | Freq: Four times a day (QID) | INTRAMUSCULAR | Status: DC | PRN
  Filled 2023-10-09: qty 2

## 2023-10-09 MED ORDER — FENTANYL CITRATE (PF) 100 MCG/2ML IJ SOLN
INTRAMUSCULAR | Status: DC | PRN
  Administered 2023-10-09 (×2): 50 ug via INTRAVENOUS

## 2023-10-09 MED ORDER — METOCLOPRAMIDE HCL 5 MG PO TABS: 5.0000 mg | ORAL_TABLET | Freq: Three times a day (TID) | ORAL | Status: DC | PRN

## 2023-10-09 MED ORDER — SODIUM CHLORIDE 0.9 % IR SOLN
Status: DC | PRN
  Administered 2023-10-09: 1000 mL

## 2023-10-09 MED ORDER — DABIGATRAN ETEXILATE MESYLATE 75 MG PO CAPS
75.0000 mg | ORAL_CAPSULE | Freq: Two times a day (BID) | ORAL | Status: DC
  Administered 2023-10-10 – 2023-10-12 (×5): 75 mg via ORAL
  Filled 2023-10-09 (×5): qty 1

## 2023-10-09 SURGICAL SUPPLY — 57 items
ADH SKN CLS APL DERMABOND .7 (GAUZE/BANDAGES/DRESSINGS) ×2
ADH SKN CLS LQ APL DERMABOND (GAUZE/BANDAGES/DRESSINGS) ×1
APL PRP STRL LF DISP 70% ISPRP (MISCELLANEOUS) ×1
BAG COUNTER SPONGE SURGICOUNT (BAG) IMPLANT
BAG SPEC THK2 15X12 ZIP CLS (MISCELLANEOUS)
BAG SPNG CNTER NS LX DISP (BAG)
BAG ZIPLOCK 12X15 (MISCELLANEOUS) IMPLANT
CHLORAPREP W/TINT 26 (MISCELLANEOUS) ×2 IMPLANT
COVER PERINEAL POST (MISCELLANEOUS) ×2 IMPLANT
COVER SURGICAL LIGHT HANDLE (MISCELLANEOUS) ×2 IMPLANT
DERMABOND ADVANCED .7 DNX12 (GAUZE/BANDAGES/DRESSINGS) ×4 IMPLANT
DERMABOND ADVANCED .7 DNX6 (GAUZE/BANDAGES/DRESSINGS) IMPLANT
DRAPE IMP U-DRAPE 54X76 (DRAPES) ×2 IMPLANT
DRAPE SHEET LG 3/4 BI-LAMINATE (DRAPES) ×6 IMPLANT
DRAPE STERI IOBAN 125X83 (DRAPES) ×2 IMPLANT
DRAPE U-SHAPE 47X51 STRL (DRAPES) ×2 IMPLANT
DRSG AQUACEL AG ADV 3.5X10 (GAUZE/BANDAGES/DRESSINGS) ×2 IMPLANT
ELECT REM PT RETURN 15FT ADLT (MISCELLANEOUS) ×2 IMPLANT
GAUZE SPONGE 4X4 12PLY STRL (GAUZE/BANDAGES/DRESSINGS) ×2 IMPLANT
GLOVE BIO SURGEON STRL SZ7 (GLOVE) ×2 IMPLANT
GLOVE BIO SURGEON STRL SZ8.5 (GLOVE) ×4 IMPLANT
GLOVE BIOGEL PI IND STRL 7.5 (GLOVE) ×2 IMPLANT
GLOVE BIOGEL PI IND STRL 8.5 (GLOVE) ×2 IMPLANT
GOWN SPEC L3 XXLG W/TWL (GOWN DISPOSABLE) ×2 IMPLANT
GOWN STRL REUS W/ TWL XL LVL3 (GOWN DISPOSABLE) ×2 IMPLANT
GOWN STRL REUS W/TWL XL LVL3 (GOWN DISPOSABLE) ×1
HANDPIECE INTERPULSE COAX TIP (DISPOSABLE) ×1
HEAD FEM -3XOFST 36XMDLR (Head) IMPLANT
HEAD MODULAR 36MM (Head) ×1 IMPLANT
HOLDER FOLEY CATH W/STRAP (MISCELLANEOUS) ×2 IMPLANT
HOOD PEEL AWAY T7 (MISCELLANEOUS) ×6 IMPLANT
KIT TURNOVER KIT A (KITS) IMPLANT
LINER ACETAB ~~LOC~~ D 36 (Liner) IMPLANT
MANIFOLD NEPTUNE II (INSTRUMENTS) ×2 IMPLANT
MARKER SKIN DUAL TIP RULER LAB (MISCELLANEOUS) ×2 IMPLANT
NDL SAFETY ECLIP 18X1.5 (MISCELLANEOUS) ×2 IMPLANT
NDL SPNL 18GX3.5 QUINCKE PK (NEEDLE) ×2 IMPLANT
NEEDLE SPNL 18GX3.5 QUINCKE PK (NEEDLE) ×1 IMPLANT
PACK ANTERIOR HIP CUSTOM (KITS) ×2 IMPLANT
SAW OSC TIP CART 19.5X105X1.3 (SAW) ×2 IMPLANT
SEALER BIPOLAR AQUA 6.0 (INSTRUMENTS) ×2 IMPLANT
SET HNDPC FAN SPRY TIP SCT (DISPOSABLE) ×2 IMPLANT
SHELL ACET G7 3H 50 SZD (Shell) IMPLANT
SOLUTION PRONTOSAN WOUND 350ML (IRRIGATION / IRRIGATOR) ×2 IMPLANT
SPIKE FLUID TRANSFER (MISCELLANEOUS) ×2 IMPLANT
STEM FEM CMTLS HIP 14X148 123D (Stem) IMPLANT
SUT MNCRL AB 3-0 PS2 18 (SUTURE) ×2 IMPLANT
SUT MON AB 2-0 CT1 36 (SUTURE) ×2 IMPLANT
SUT STRATAFIX PDO 1 14 VIOLET (SUTURE) ×1
SUT STRATFX PDO 1 14 VIOLET (SUTURE) ×1
SUT VIC AB 2-0 CT1 27 (SUTURE)
SUT VIC AB 2-0 CT1 TAPERPNT 27 (SUTURE) IMPLANT
SUTURE STRATFX PDO 1 14 VIOLET (SUTURE) ×2 IMPLANT
SYR 3ML LL SCALE MARK (SYRINGE) ×2 IMPLANT
TRAY FOLEY MTR SLVR 16FR STAT (SET/KITS/TRAYS/PACK) IMPLANT
TUBE SUCTION HIGH CAP CLEAR NV (SUCTIONS) ×2 IMPLANT
WATER STERILE IRR 1000ML POUR (IV SOLUTION) ×2 IMPLANT

## 2023-10-09 NOTE — Op Note (Addendum)
OPERATIVE REPORT  SURGEON: Samson Frederic, MD   ASSISTANT: Clint Bolder, PA-C  PREOPERATIVE DIAGNOSIS: Displaced Right femoral neck fracture.   POSTOPERATIVE DIAGNOSIS: Displaced Right femoral neck fracture.   PROCEDURE: Right total hip arthroplasty, anterior approach.   IMPLANTS: Biomet Taperloc Reduced Distal stem, size 14 x 148 mm, high offset. Biomet G7 OsseoTi Cup, size 50 mm. Biomet Vivacit-E liner, size 36 mm, D, neutral. Biomet metal head ball, size 36 - 3 mm.  ANESTHESIA:  General  ANTIBIOTICS: 1g vancomycin.  ESTIMATED BLOOD LOSS:-250 mL    DRAINS: None.  COMPLICATIONS: None   CONDITION: PACU - hemodynamically stable.   BRIEF CLINICAL NOTE: [name] is a 89 y.o. female with a displaced Right femoral neck fracture. The patient was admitted to the hospitalist service and underwent perioperative risk stratification and medical optimization. The risks, benefits, and alternatives to total hip arthroplasty were explained, and the patient elected to proceed.  PROCEDURE IN DETAIL: The patient was taken to the operating room and general anesthesia was induced on the hospital bed.  The patient was then positioned on the Hana table.  All bony prominences were well padded.  The hip was prepped and draped in the normal sterile surgical fashion.  A time-out was called verifying side and site of surgery. Antibiotics were given within 60 minutes of beginning the procedure.   Bikini incision was made, and the direct anterior approach to the hip was performed through the Hueter interval.  Lateral femoral circumflex vessels were treated with the Auqumantys. The anterior capsule was exposed and an inverted T capsulotomy was made.  Fracture hematoma was encountered and evacuated. The patient was found to have a comminuted Right subcapital femoral neck fracture.  I freshened the femoral neck cut with a saw.  I removed the femoral neck fragment.  A corkscrew was placed into the head and the  head was removed.  This was passed to the back table and was measured. The pubofemoral ligament was released subperiosteally to the lesser trochanter.  Acetabular exposure was achieved, and the pulvinar and labrum were excised. Sequential reaming of the acetabulum was then performed up to a size 49 mm reamer under direct visulization. A 50 mm cup was then opened and impacted into place at approximately 40 degrees of abduction and 20 degrees of anteversion. The final polyethylene liner was impacted into place and acetabular osteophytes were removed.    I then gained femoral exposure taking care to protect the abductors and greater trochanter.  This was performed using standard external rotation, extension, and adduction.  A cookie cutter was used to enter the femoral canal, and then the femoral canal finder was placed.  Sequential broaching was performed up to a size 14.  Calcar planer was used on the femoral neck remnant.  I placed a high offset neck and a trial head ball.  The hip was reduced.  Leg lengths and offset were checked fluoroscopically.  The hip was dislocated and trial components were removed.  The final implants were placed, and the hip was reduced.  Fluoroscopy was used to confirm component position and leg lengths.  At 90 degrees of external rotation and full extension, the hip was stable to an anterior directed force.   The wound was copiously irrigated with Irrisept solution and normal saline using pulse lavage.  Marcaine solution was injected into the periarticular soft tissue.  The wound was closed in layers using #1 Stratafix for the fascia, 2-0 Vicryl for the subcutaneous fat, 2-0 Monocryl  for the deep dermal layer, and staples + Dermabond for the skin.  Once the glue was fully dried, an Aquacell Ag dressing was applied.  The patient was transported to the recovery room in stable condition.  Sponge, needle, and instrument counts were correct at the end of the case x2.  The patient  tolerated the procedure well and there were no known complications.  Please note that a surgical assistant was a medical necessity for this procedure to perform it in a safe and expeditious manner. Assistant was necessary to provide appropriate retraction of vital neurovascular structures, to prevent femoral fracture, and to allow for anatomic placement of the prosthesis.

## 2023-10-09 NOTE — Plan of Care (Signed)

## 2023-10-09 NOTE — Interval H&P Note (Signed)
History and Physical Interval Note:  10/09/2023 3:39 PM  [name]  has presented today for surgery, with the diagnosis of RIGHT FEMORAL NECK FRACTURE.  The various methods of treatment have been discussed with the patient and family. After consideration of risks, benefits and other options for treatment, the patient has consented to  Procedure(s): TOTAL HIP ARTHROPLASTY ANTERIOR APPROACH (Right) as a surgical intervention.  The patient's history has been reviewed, patient examined, no change in status, stable for surgery.  I have reviewed the patient's chart and labs.  Questions were answered to the patient's satisfaction.    The risks, benefits, and alternatives were discussed with the patient. There are risks associated with the surgery including, but not limited to, problems with anesthesia (death), infection, instability (giving out of the joint), dislocation, differences in leg length/angulation/rotation, fracture of bones, loosening or failure of implants, hematoma (blood accumulation) which may require surgical drainage, blood clots, pulmonary embolism, nerve injury (foot drop and lateral thigh numbness), and blood vessel injury. The patient understands these risks and elects to proceed.    [name]

## 2023-10-09 NOTE — Anesthesia Procedure Notes (Signed)
Procedure Name: Intubation Date/Time: 10/09/2023 4:04 PM  Performed by: Chinita Pester, CRNAPre-anesthesia Checklist: Patient identified, Emergency Drugs available, Suction available and Patient being monitored Patient Re-evaluated:Patient Re-evaluated prior to induction Oxygen Delivery Method: Circle System Utilized Preoxygenation: Pre-oxygenation with 100% oxygen Induction Type: IV induction Ventilation: Mask ventilation without difficulty Laryngoscope Size: Mac and 4 Grade View: Grade II Tube type: Oral Tube size: 7.0 mm Number of attempts: 1 Airway Equipment and Method: Stylet and Oral airway Placement Confirmation: ETT inserted through vocal cords under direct vision, positive ETCO2 and breath sounds checked- equal and bilateral Secured at: 21 cm Tube secured with: Tape Dental Injury: Teeth and Oropharynx as per pre-operative assessment

## 2023-10-09 NOTE — Transfer of Care (Signed)
Immediate Anesthesia Transfer of Care Note  Patient: [name]  Procedure(s) Performed: Procedure(s): TOTAL HIP ARTHROPLASTY ANTERIOR APPROACH (Right)  Patient Location: PACU  Anesthesia Type:General  Level of Consciousness:  sedated, patient cooperative and responds to stimulation  Airway & Oxygen Therapy:Patient Spontanous Breathing and Patient connected to face mask oxgen  Post-op Assessment:  Report given to PACU RN and Post -op Vital signs reviewed and stable  Post vital signs:  Reviewed and stable  Last Vitals:  Vitals:   10/09/23 0559 10/09/23 1245  BP: (!) 130/50 (!) 118/50  Pulse: 77 72  Resp: 18 18  Temp: 36.7 C 37.4 C  SpO2: 97% 94%    Complications: No apparent anesthesia complications

## 2023-10-09 NOTE — Discharge Instructions (Signed)
? ?Dr. Brian Swinteck ?Joint Replacement Specialist ?[facility] ?[address] ?[place], [place]  [address] ?[phone] ? ? ?TOTAL HIP REPLACEMENT POSTOPERATIVE DIRECTIONS ? ? ? ?Hip Rehabilitation, Guidelines Following Surgery  ? ?WEIGHT BEARING ?Weight bearing as tolerated with assist device (walker, cane, etc) as directed, use it as long as suggested by your surgeon or therapist, typically at least 4-6 weeks. ? ?The results of a hip operation are greatly improved after range of motion and muscle strengthening exercises. Follow all safety measures which are given to protect your hip. If any of these exercises cause increased pain or swelling in your joint, decrease the amount until you are comfortable again. Then slowly increase the exercises. Call your caregiver if you have problems or questions.  ? ?HOME CARE INSTRUCTIONS  ?Most of the following instructions are designed to prevent the dislocation of your new hip.  ?Remove items at home which could result in a fall. This includes throw rugs or furniture in walking pathways.  ?Continue medications as instructed at time of discharge. ?You may have some home medications which will be placed on hold until you complete the course of blood thinner medication. ?You may start showering once you are discharged home. Do not remove your dressing. ?Do not put on socks or shoes without following the instructions of your caregivers.   ?Sit on chairs with arms. Use the chair arms to help push yourself up when arising.  ?Arrange for the use of a toilet seat elevator so you are not sitting low.  ?Walk with walker as instructed.  ?You may resume a sexual relationship in one month or when given the OK by your caregiver.  ?Use walker as long as suggested by your caregivers.  ?You may put full weight on your legs and walk as much as is comfortable. ?Avoid periods of inactivity such as sitting longer than an hour when not asleep. This helps prevent blood  clots.  ?You may return to work once you are cleared by your surgeon.  ?Do not drive a car for 6 weeks or until released by your surgeon.  ?Do not drive while taking narcotics.  ?Wear elastic stockings for two weeks following surgery during the day but you may remove then at night.  ?Make sure you keep all of your appointments after your operation with all of your doctors and caregivers. You should call the office at the above phone number and make an appointment for approximately two weeks after the date of your surgery. ?Please pick up a stool softener and laxative for home use as long as you are requiring pain medications. ?ICE to the affected hip every three hours for 30 minutes at a time and then as needed for pain and swelling. Continue to use ice on the hip for pain and swelling from surgery. You may notice swelling that will progress down to the foot and ankle.  This is normal after surgery.  Elevate the leg when you are not up walking on it.   ?It is important for you to complete the blood thinner medication as prescribed by your doctor. ?Continue to use the breathing machine which will help keep your temperature down.  It is common for your temperature to cycle up and down following surgery, especially at night when you are not up moving around and exerting yourself.  The breathing machine keeps your lungs expanded and your temperature down. ? ?RANGE OF MOTION AND STRENGTHENING EXERCISES  ?These exercises are designed to help you   keep full movement of your hip joint. Follow your caregiver's or physical therapist's instructions. Perform all exercises about fifteen times, three times per day or as directed. Exercise both hips, even if you have had only one joint replacement. These exercises can be done on a training (exercise) mat, on the floor, on a table or on a bed. Use whatever works the best and is most comfortable for you. Use music or television while you are exercising so that the exercises are a  pleasant break in your day. This will make your life better with the exercises acting as a break in routine you can look forward to.  ?Lying on your back, slowly slide your foot toward your buttocks, raising your knee up off the floor. Then slowly slide your foot back down until your leg is straight again.  ?Lying on your back spread your legs as far apart as you can without causing discomfort.  ?Lying on your side, raise your upper leg and foot straight up from the floor as far as is comfortable. Slowly lower the leg and repeat.  ?Lying on your back, tighten up the muscle in the front of your thigh (quadriceps muscles). You can do this by keeping your leg straight and trying to raise your heel off the floor. This helps strengthen the largest muscle supporting your knee.  ?Lying on your back, tighten up the muscles of your buttocks both with the legs straight and with the knee bent at a comfortable angle while keeping your heel on the floor.  ? ?SKILLED REHAB INSTRUCTIONS: ?If the patient is transferred to a skilled rehab facility following release from the hospital, a list of the current medications will be sent to the facility for the patient to continue.  When discharged from the skilled rehab facility, please have the facility set up the patient's Home Health Physical Therapy prior to being released. Also, the skilled facility will be responsible for providing the patient with their medications at time of release from the facility to include their pain medication and their blood thinner medication. If the patient is still at the rehab facility at time of the two week follow up appointment, the skilled rehab facility will also need to assist the patient in arranging follow up appointment in our office and any transportation needs. ? ?POST-OPERATIVE OPIOID TAPER INSTRUCTIONS: ?It is important to wean off of your opioid medication as soon as possible. If you do not need pain medication after your surgery it is ok  to stop day one. ?Opioids include: ?Codeine, Hydrocodone(Norco, Vicodin), Oxycodone(Percocet, oxycontin) and hydromorphone amongst others.  ?Long term and even short term use of opiods can cause: ?Increased pain response ?Dependence ?Constipation ?Depression ?Respiratory depression ?And more.  ?Withdrawal symptoms can include ?Flu like symptoms ?Nausea, vomiting ?And more ?Techniques to manage these symptoms ?Hydrate well ?Eat regular healthy meals ?Stay active ?Use relaxation techniques(deep breathing, meditating, yoga) ?Do Not substitute Alcohol to help with tapering ?If you have been on opioids for less than two weeks and do not have pain than it is ok to stop all together.  ?Plan to wean off of opioids ?This plan should start within one week post op of your joint replacement. ?Maintain the same interval or time between taking each dose and first decrease the dose.  ?Cut the total daily intake of opioids by one tablet each day ?Next start to increase the time between doses. ?The last dose that should be eliminated is the evening dose.  ? ? ?MAKE   SURE YOU:  ?Understand these instructions.  ?Will watch your condition.  ?Will get help right away if you are not doing well or get worse. ? ?Pick up stool softner and laxative for home use following surgery while on pain medications. ?Do not remove your dressing. ?The dressing is waterproof--it is OK to take showers. ?Continue to use ice for pain and swelling after surgery. ?Do not use any lotions or creams on the incision until instructed by your surgeon. ?Total Hip Protocol. ? ?

## 2023-10-09 NOTE — Plan of Care (Signed)
  Problem: Metabolic: Goal: Ability to maintain appropriate glucose levels will improve Outcome: Progressing   Problem: Coping: Goal: Level of anxiety will decrease Outcome: Progressing   Problem: Pain Managment: Goal: General experience of comfort will improve Outcome: Progressing

## 2023-10-09 NOTE — Anesthesia Preprocedure Evaluation (Addendum)
Anesthesia Evaluation  Patient identified by MRN, date of birth, ID band Patient awake    Reviewed: Allergy & Precautions, NPO status , Patient's Chart, lab work & pertinent test results  Airway Mallampati: III  TM Distance: >3 FB Neck ROM: Full    Dental no notable dental hx.    Pulmonary neg pulmonary ROS   Pulmonary exam normal        Cardiovascular hypertension, Pt. on home beta blockers + angina  + CAD and + CABG  Normal cardiovascular exam+ dysrhythmias Atrial Fibrillation      Neuro/Psych  Headaches TIACVA  negative psych ROS   GI/Hepatic negative GI ROS,,,(+) Hepatitis -  Endo/Other  diabetes, Insulin Dependent    Renal/GU Renal disease     Musculoskeletal  (+) Arthritis ,    Abdominal  (+) + obese  Peds  Hematology  (+) Blood dyscrasia, anemia   Anesthesia Other Findings RIGHT FEMORAL NECK FRACTURE  Reproductive/Obstetrics                             Anesthesia Physical Anesthesia Plan  ASA: 4  Anesthesia Plan: General   Post-op Pain Management:    Induction: Intravenous  PONV Risk Score and Plan: 3 and Ondansetron, Dexamethasone and Treatment may vary due to age or medical condition  Airway Management Planned: Oral ETT  Additional Equipment:   Intra-op Plan:   Post-operative Plan: Extubation in OR  Informed Consent: I have reviewed the patients History and Physical, chart, labs and discussed the procedure including the risks, benefits and alternatives for the proposed anesthesia with the patient or authorized representative who has indicated his/her understanding and acceptance.     Dental advisory given  Plan Discussed with: CRNA  Anesthesia Plan Comments:        Anesthesia Quick Evaluation

## 2023-10-09 NOTE — Progress Notes (Signed)
Progress Note   Patient: [name] QIO:[id number] DOB: 08-Dec-1934 DOA: 10/07/2023     2 DOS: the patient was seen and examined on 10/09/2023   Brief hospital course: 89 year old woman PMH including atrial fibrillation on Pradaxa, CABG, rheumatoid arthritis, presented after a mechanical fall resulting in right hip pain.  Imaging revealed hip fracture.  Consultants Orthopedics  Cardiology   Procedures     Assessment and Plan: Right hip fracture status post mechanical fall Surgery today.  Continue hip fracture protocol. Holding Pradaxa.     CAD  PMH CABG in 2006  Had NM PET on 08/26/23 for episodes of chest heaviness and there was a small area of apical lateral ischemia  Seen by cardiology, cleared for surgery Continue Coreg, Imdur, Crestor   Spironolactone held secondary to hyperkalemia.  Can resume on discharge.   Atrial fibrillation  Holding anticoagulation.  Continue carvedilol.   Insulin-dependent DM  CBG remains stable.  Continue Semglee, sliding scale insulin, meal coverage   Chronic diastolic CHF   Appears compensated.  Continue carvedilol.  Monitor volume status.  Aldactone on hold as above.   CKD 3A Stable.   Hyperkalemia -- resolved    Subjective:  Has some hip pain Has had some reflux symptoms  Physical Exam: Vitals:   10/08/23 1612 10/08/23 2155 10/09/23 0559 10/09/23 1245  BP: (!) 107/47 (!) 126/49 (!) 130/50 (!) 118/50  Pulse: 88 76 77 72  Resp: 16 17 18 18   Temp: 98.5 F (36.9 C) 97.9 F (36.6 C) 98.1 F (36.7 C) 99.3 F (37.4 C)  TempSrc:    Oral  SpO2: (!) 83% 92% 97% 94%  Weight:    88.5 kg  Height:    5' (1.524 m)   Physical Exam Vitals reviewed.  Constitutional:      General: She is not in acute distress.    Appearance: She is not ill-appearing or toxic-appearing.  Cardiovascular:     Rate and Rhythm: Normal rate and regular rhythm.     Heart sounds: No murmur heard. Pulmonary:     Effort: Pulmonary effort is normal. No  respiratory distress.     Breath sounds: No wheezing, rhonchi or rales.  Neurological:     Mental Status: She is alert.  Psychiatric:        Mood and Affect: Mood normal.        Behavior: Behavior normal.     Data Reviewed: K+ 4.4 Hgb 11.1  Family Communication: son by telephone 10/9  Disposition: Status is: Inpatient Remains inpatient appropriate because: hip fracture     Time spent: 20 minutes  Author: Brendia Sacks, MD 10/09/2023 5:58 PM  For on call review [email or web address].

## 2023-10-10 ENCOUNTER — Encounter (HOSPITAL_COMMUNITY): Payer: Self-pay | Admitting: Orthopedic Surgery

## 2023-10-10 DIAGNOSIS — E1169 Type 2 diabetes mellitus with other specified complication: Secondary | ICD-10-CM | POA: Diagnosis not present

## 2023-10-10 DIAGNOSIS — Z794 Long term (current) use of insulin: Secondary | ICD-10-CM | POA: Diagnosis not present

## 2023-10-10 DIAGNOSIS — I2510 Atherosclerotic heart disease of native coronary artery without angina pectoris: Secondary | ICD-10-CM | POA: Diagnosis not present

## 2023-10-10 DIAGNOSIS — S72001A Fracture of unspecified part of neck of right femur, initial encounter for closed fracture: Secondary | ICD-10-CM | POA: Diagnosis not present

## 2023-10-10 DIAGNOSIS — I480 Paroxysmal atrial fibrillation: Secondary | ICD-10-CM | POA: Diagnosis not present

## 2023-10-10 LAB — GLUCOSE, CAPILLARY
Glucose-Capillary: 228 mg/dL — ABNORMAL HIGH (ref 70–99)
Glucose-Capillary: 188 mg/dL — ABNORMAL HIGH (ref 70–99)
Glucose-Capillary: 255 mg/dL — ABNORMAL HIGH (ref 70–99)
Glucose-Capillary: 288 mg/dL — ABNORMAL HIGH (ref 70–99)

## 2023-10-10 LAB — BASIC METABOLIC PANEL
Anion gap: 9 (ref 5–15)
BUN: 33 mg/dL — ABNORMAL HIGH (ref 8–23)
CO2: 20 mmol/L — ABNORMAL LOW (ref 22–32)
Calcium: 8.3 mg/dL — ABNORMAL LOW (ref 8.9–10.3)
Chloride: 102 mmol/L (ref 98–111)
Creatinine, Ser: 1.24 mg/dL — ABNORMAL HIGH (ref 0.44–1.00)
GFR, Estimated: 42 mL/min — ABNORMAL LOW (ref >60)
Glucose, Bld: 220 mg/dL — ABNORMAL HIGH (ref 70–99)
Potassium: 4.9 mmol/L (ref 3.5–5.1)
Sodium: 131 mmol/L — ABNORMAL LOW (ref 135–145)

## 2023-10-10 LAB — CBC
HCT: 33.2 % — ABNORMAL LOW (ref 36.0–46.0)
Hemoglobin: 10.7 g/dL — ABNORMAL LOW (ref 12.0–15.0)
MCH: 30.1 pg (ref 26.0–34.0)
MCHC: 32.2 g/dL (ref 30.0–36.0)
MCV: 93.5 fL (ref 80.0–100.0)
Platelets: 125 10*3/uL — ABNORMAL LOW (ref 150–400)
RBC: 3.55 MIL/uL — ABNORMAL LOW (ref 3.87–5.11)
RDW: 11.7 % (ref 11.5–15.5)
WBC: 8.7 10*3/uL (ref 4.0–10.5)
nRBC: 0 % (ref 0.0–0.2)

## 2023-10-10 NOTE — Progress Notes (Signed)
Progress Note  Patient Name: [name] Date of Encounter: 10/10/2023 Primary Cardiologist: Chrystie Nose, MD   Subjective   Overnight has surgery without cardiac complications. Patient notes no CP, SOB, Palpitations. She is on O2 for hypoxia.  Vital Signs    Vitals:   10/09/23 2200 10/09/23 2247 10/10/23 0158 10/10/23 0548  BP: (!) 127/46 (!) 121/42 (!) 129/56 (!) 130/51  Pulse:  71 73 76  Resp:  18 18 18   Temp:  97.7 F (36.5 C) 97.8 F (36.6 C) (!) 97.5 F (36.4 C)  TempSrc:      SpO2:  91% 96% 92%  Weight:      Height:        Intake/Output Summary (Last 24 hours) at 10/10/2023 0706 Last data filed at 10/10/2023 0600 Gross per 24 hour  Intake 2389.34 ml  Output 1100 ml  Net 1289.34 ml   Filed Weights   10/07/23 1904 10/09/23 1245  Weight: 88.5 kg 88.5 kg    Physical Exam   GEN: No acute distress.   Neck: No JVD Cardiac: RRR, no murmurs, rubs, or gallops.  Respiratory: Clear to auscultation bilaterally. GI: Soft, nontender, non-distended  MS: No edema  Labs   Telemetry: NA   Chemistry Recent Labs  Lab 10/08/23 0356 10/09/23 0355 10/10/23 0329  NA 139 134* 131*  K 5.3* 4.4 4.9  CL 109 104 102  CO2 21* 23 20*  GLUCOSE 192* 124* 220*  BUN 29* 27* 33*  CREATININE 0.95 1.05* 1.24*  CALCIUM 9.2 9.1 8.3*  GFRNONAA 58* 51* 42*  ANIONGAP 9 7 9      Hematology Recent Labs  Lab 10/08/23 0356 10/09/23 0355 10/10/23 0329  WBC 9.3 8.2 8.7  RBC 3.97 3.69* 3.55*  HGB 12.1 11.1* 10.7*  HCT 39.6 35.1* 33.2*  MCV 99.7 95.1 93.5  MCH 30.5 30.1 30.1  MCHC 30.6 31.6 32.2  RDW 11.9 11.9 11.7  PLT PLATELET CLUMPS NOTED ON SMEAR, UNABLE TO ESTIMATE 129* 125*    Cardiac EnzymesNo results for input(s): "TROPONINI" in the last 168 hours. No results for input(s): "TROPIPOC" in the last 168 hours.   BNPNo results for input(s): "BNP", "PROBNP" in the last 168 hours.   DDimer No results for input(s): "DDIMER" in the last 168 hours.   Cardiac  Studies   Cardiac Studies & Procedures     STRESS TESTS  NM PET CT CARDIAC PERFUSION MULTI W/ABSOLUTE BLOODFLOW 08/26/2023  Narrative   Findings are consistent with a small area of apical lateral ischemia. The study is intermediate risk in the setting of quantitative TID, slight decrease in LVEF in stress, and PVC burden.   Rest left ventricular function is normal. Rest EF: 70%. Stress left ventricular function is normal. Stress EF: 67%. End diastolic cavity size is normal. End systolic cavity size is normal.   Myocardial blood flow was computed to be 1.99ml/g/min at rest and 2.44ml/g/min at stress. Global myocardial blood flow reserve was 2.39 and was normal.   Coronary calcium assessment not performed due to prior revascularization. Aortic atherosclerosis noted.  Mitral annular calcification noted   Electronically Signed  By: Riley Lam M.D.  CLINICAL DATA:  This over-read does not include interpretation of cardiac or coronary anatomy or pathology. The Cardiac PET CT interpretation by the cardiologist is attached.  COMPARISON:  None Available.  FINDINGS: Cardiovascular: Aortic atherosclerosis. Mild cardiomegaly status post median sternotomy and CABG. Three-vessel coronary artery calcifications. No pericardial effusion.  Limited Mediastinum/Nodes: No enlarged mediastinal, hilar,  or axillary lymph nodes. Trachea and esophagus demonstrate no significant findings.  Limited Lungs/Pleura: Mild bibasilar scarring or atelectasis. No pleural effusion or pneumothorax.  Upper Abdomen: No acute abnormality.  Musculoskeletal: No chest wall abnormality. No acute osseous findings.  IMPRESSION: 1. No acute CT findings of the chest. 2. Mild cardiomegaly status post median sternotomy and CABG. 3. Coronary artery disease.  Aortic Atherosclerosis (ICD10-I70.0).   Electronically Signed By: Jearld Lesch M.D. On: 08/26/2023 12:54   ECHOCARDIOGRAM  ECHOCARDIOGRAM COMPLETE  02/26/2022  Narrative ECHOCARDIOGRAM REPORT    Patient Name:   [name] Date of Exam: 02/26/2022 Medical Rec #:  [id number]       Height:       62.0 in Accession #:    [id number]      Weight:       202.0 lb Date of Birth:  November 13, 1934       BSA:          1.920 m Patient Age:    88 years        BP:           111/58 mmHg Patient Gender: F               HR:           68 bpm. Exam Location:  [address]  Procedure: 2D Echo, Color Doppler, Cardiac Doppler and Intracardiac Opacification Agent  Indications:    R55 Syncope  History:        Patient has prior history of Echocardiogram examinations, most recent 07/20/2020. CAD, TIA, Arrythmias:Atrial Flutter and 1st degree AV block, Signs/Symptoms:Syncope; Risk Factors:Hypertension and Diabetes.  Sonographer:    Jorje Guild [facility] RDCS Referring Phys: [id number] [facility]   Sonographer Comments: Technically difficult study due to poor echo windows and suboptimal apical window. Image acquisition challenging due to patient body habitus. IMPRESSIONS   1. Left ventricular ejection fraction, by estimation, is 55 to 60%. The left ventricle has normal function. Left ventricular diastolic parameters are consistent with Grade I diastolic dysfunction (impaired relaxation). 2. Right ventricular systolic function is normal. The right ventricular size is normal. Tricuspid regurgitation signal is inadequate for assessing PA pressure. 3. Severe mitral annular calcification. 4. Aortic valve regurgitation is not visualized. Aortic valve sclerosis/calcification is present, without any evidence of aortic stenosis. 5. The inferior vena cava is normal in size with greater than 50% respiratory variability, suggesting right atrial pressure of 3 mmHg.  Comparison(s): No significant change from prior study. 07/20/20 EF 60-65%.  FINDINGS Left Ventricle: Left ventricular ejection fraction, by estimation, is 55 to 60%. The left ventricle has normal  function. Definity contrast agent was given IV to delineate the left ventricular endocardial borders. The left ventricular internal cavity size was normal in size. Left ventricular hypertrophy of the sigmoid septum segment. Left ventricular diastolic parameters are consistent with Grade I diastolic dysfunction (impaired relaxation).  Right Ventricle: The right ventricular size is normal. No increase in right ventricular wall thickness. Right ventricular systolic function is normal. Tricuspid regurgitation signal is inadequate for assessing PA pressure.  Left Atrium: Left atrial size was normal in size.  Right Atrium: Right atrial size was normal in size.  Pericardium: There is no evidence of pericardial effusion.  Mitral Valve: Severe mitral annular calcification.  Tricuspid Valve: Tricuspid valve regurgitation is not demonstrated.  Aortic Valve: Aortic valve regurgitation is not visualized. Aortic valve sclerosis/calcification is present, without any evidence of aortic stenosis.  Aorta: The aortic root and ascending  aorta are structurally normal, with no evidence of dilitation.  Venous: The inferior vena cava is normal in size with greater than 50% respiratory variability, suggesting right atrial pressure of 3 mmHg.   LEFT VENTRICLE PLAX 2D LVIDd:         4.00 cm   Diastology LVIDs:         2.80 cm   LV e' medial:    7.30 cm/s LV PW:         0.90 cm   LV E/e' medial:  12.9 LV IVS:        0.90 cm   LV e' lateral:   8.76 cm/s LVOT diam:     2.20 cm   LV E/e' lateral: 10.7 LV SV:         113 LV SV Index:   59 LVOT Area:     3.80 cm   RIGHT VENTRICLE             IVC RV Basal diam:  3.20 cm     IVC diam: 1.70 cm RV S prime:     12.50 cm/s TAPSE (M-mode): 1.9 cm  LEFT ATRIUM             Index        RIGHT ATRIUM           Index LA diam:        3.50 cm 1.82 cm/m   RA Pressure: 3.00 mmHg LA Vol (A2C):   39.7 ml 20.68 ml/m  RA Area:     17.10 cm LA Vol (A4C):   63.6 ml 33.13  ml/m  RA Volume:   43.70 ml  22.76 ml/m LA Biplane Vol: 52.4 ml 27.29 ml/m AORTIC VALVE LVOT Vmax:   160.00 cm/s LVOT Vmean:  98.500 cm/s LVOT VTI:    0.297 m  AORTA Ao Root diam: 3.10 cm Ao Asc diam:  3.20 cm  MITRAL VALVE                TRICUSPID VALVE Estimated RAP:  3.00 mmHg MV Decel Time: 292 msec MV E velocity: 94.00 cm/s   SHUNTS MV A velocity: 130.00 cm/s  Systemic VTI:  0.30 m MV E/A ratio:  0.72         Systemic Diam: 2.20 cm  Carolan Clines Electronically signed by Carolan Clines Signature Date/Time: 02/26/2022/6:03:36 PM    Final    MONITORS  LONG TERM MONITOR (3-14 DAYS) 03/19/2022  Narrative  Patient had a minimum heart rate of 51 bpm, maximum heart rate of 184 bpm, and average heart rate of 76 bpm.  Predominant underlying rhythm was sinus rhythm.  Nineteen runs of P-SVT, longest 15 beats , fastest 184 bpm.  Isolated PACs were rare (<1.0%).  Isolated PVCs were occasional (1.0%).  Triggered and diary events associated with sinus rhythm or PVC.  No evidence of arrhythmogenic syncope.            Assessment & Plan   Chronic Diastolic CHF Last Echo in 01/2022 showed LVEF of 55-60% with grade 1 diastolic dysfunction and severe mitral annular calcification.  - Euvolemic on exam.  - Heart murmurs suggestive of both aortic sclerosis and degenerative, likely mild, mitral stenosis; continue beta blocker as above - in the setting of K that has improved but is at the ULN, would defer MRA therapy to outpatient - presently we have recommended incentive spirometer and did teaching today for hypoxia.  If worsening O2 would get BNP and consider post operative  lasix  CAD s/p CABG Has a history of CAD with prior CABG in 2006.  Recent cardiac PET stress test showed a small area of apical lateral ischemia.  Has known LBBB - asymptomatic - Continue antianginals: Imdur 15mg  daily and Coreg 6.25mg  twice daily. - No aspirin due to need for anticoagulation. - Continue  statin.     Paroxysmal Atrial Fibrillation Maintaining sinus rhythm. - Continue Coreg 6.25mg  twice daily. - On chronic anticoagulation with Pradaxa at home, to be returned when cleared by surgery   Hypertension - Continue Coreg and Imdur as above. -  Spironolactone and Ramipril for outpatient   Hyperlipidemia - Continue Crestor 10mg  daily.   CKD Stage IIIa - creatinine is slowly increasing   Hyperkalemia - management as above - Continue to monitor.     Otherwise, per primary team: - Femoral neck fracture - Type 2 diabetes mellitus - Migraines - History of CVA  Chart check tomorrow; if O2 issues have resolved will need outpatient follow up. Patient presently notes she would like to see Dr. Cristal Deer because of the [facility] location.  Will not change 11/06/23 f/u unless patient/family requests  For questions or updates, please contact [facility] Please consult [email or web address] for contact info under Cardiology/STEMI.      Riley Lam, MD FASE [facility] Cardiologist [facility]  [address] [place], [address] [place], [place] [address] [phone]  7:06 AM

## 2023-10-10 NOTE — Evaluation (Signed)
Occupational Therapy Evaluation Patient Details Name: [name] MRN: [id number] DOB: 1934-05-22 Today's Date: 10/10/2023   History of Present Illness Pt s/p fall with R hip fx and now s/p THR by anterior approach.  Pt with hx of CAD, CVA (2012), CABG, L THR and L TKR.   Clinical Impression   Pt seen for OT eval and PT tx to maximize therapeutic outcomes and for pt / therapist safety.  PTA, pt IND with ADL/IADL performance and takes care of her husband. Fell while cooking.   Pt functionally limited by decreased ROM, pain, decreased tolerance to activity and generalized weakness. Performs transfers +2 for safety (c/o dizziness) using RW/MINA, TOTAL for pericare after urine void from sit/stand position on BSC, and will require MAX A for LB ADLs, setup-CGA for UB ADLs. Would benefit from orthostatic vitals next session, and AE education for LB dressing techniques. BP sitting EOB 131/50 after transfers. Pt left in bed, need within reach and bed alarm activated.  Pt would benefit from skilled OT services to address noted impairments and functional limitations (see below for any additional details) in order to maximize safety and independence while minimizing falls risk and caregiver burden. Anticipate the need for follow up OT services upon acute hospital DC.       If plan is discharge home, recommend the following: A lot of help with walking and/or transfers;A lot of help with bathing/dressing/bathroom;Assistance with cooking/housework;Assist for transportation;Help with stairs or ramp for entrance    Functional Status Assessment  Patient has had a recent decline in their functional status and demonstrates the ability to make significant improvements in function in a reasonable and predictable amount of time.  Equipment Recommendations  None recommended by OT (defer)    Recommendations for Other Services Other (comment)     Precautions / Restrictions Precautions Precautions:  Fall Restrictions Weight Bearing Restrictions: No RLE Weight Bearing: Weight bearing as tolerated      Mobility Bed Mobility Overal bed mobility: Needs Assistance Bed Mobility: Sit to Supine       Sit to supine: Mod assist, +2 for safety/equipment, +2 for physical assistance   General bed mobility comments: Increased time with cues for use of L LE to self assist, use of rails and use of bed pad to complete rotation to EOB sitting    Transfers Overall transfer level: Needs assistance Equipment used: Rolling walker (2 wheels) Transfers: Sit to/from Stand, Bed to chair/wheelchair/BSC Sit to Stand: Min assist, +2 physical assistance, +2 safety/equipment     Step pivot transfers: Min assist, +2 physical assistance, +2 safety/equipment            Balance Overall balance assessment: Needs assistance Sitting-balance support: No upper extremity supported, Feet supported Sitting balance-Leahy Scale: Good     Standing balance support: Bilateral upper extremity supported Standing balance-Leahy Scale: Poor                             ADL either performed or assessed with clinical judgement   ADL Overall ADL's : Needs assistance/impaired                         Toilet Transfer: Minimal assistance;+2 for safety/equipment;Cueing for sequencing;Cueing for safety;Stand-pivot;BSC/3in1;Rolling walker (2 wheels) Toilet Transfer Details (indicate cue type and reason): +2 for safety Toileting- Clothing Manipulation and Hygiene: Total assistance;Sit to/from stand       Functional mobility during ADLs: Rolling walker (  2 wheels) General ADL Comments: Pt functionally limited by decreased ROM, pain, decreased tolerance to activity and generalized weakness. Performs transfers +2 for safety (c/o dizziness) using RW/MINA, TOTAL for pericare from sit/stand position on BSC, and will require MAX A for LB ADLs, setup-CGA for UB ADLs.      Pertinent Vitals/Pain Pain  Assessment Pain Assessment: Faces Faces Pain Scale: Hurts a little bit Pain Location: R hip and knee Pain Descriptors / Indicators: Aching, Sore Pain Intervention(s): Limited activity within patient's tolerance     Extremity/Trunk Assessment Upper Extremity Assessment Upper Extremity Assessment: Generalized weakness   Lower Extremity Assessment Lower Extremity Assessment: Defer to PT evaluation   Cervical / Trunk Assessment Cervical / Trunk Assessment: Kyphotic   Communication Communication Communication: Hearing impairment   Cognition Arousal: Alert Behavior During Therapy: WFL for tasks assessed/performed Overall Cognitive Status: Within Functional Limits for tasks assessed                                       General Comments  BP 131/50 sitting EOB after transfer            Home Living Family/patient expects to be discharged to:: Unsure Living Arrangements: Spouse/significant other                               Additional Comments: Pt lives with spouse who requires 24/7 care.      Prior Functioning/Environment Prior Level of Function : Independent/Modified Independent                        OT Problem List: Decreased strength;Decreased range of motion;Decreased activity tolerance;Impaired balance (sitting and/or standing);Decreased safety awareness;Decreased knowledge of use of DME or AE;Decreased knowledge of precautions;Pain      OT Treatment/Interventions: Self-care/ADL training;Therapeutic exercise;Energy conservation;DME and/or AE instruction;Therapeutic activities;Patient/family education;Balance training    OT Goals(Current goals can be found in the care plan section) Acute Rehab OT Goals OT Goal Formulation: With patient Time For Goal Achievement: 10/24/23 Potential to Achieve Goals: Good  OT Frequency: Min 1X/week       AM-PAC OT "6 Clicks" Daily Activity     Outcome Measure Help from another person eating  meals?: None Help from another person taking care of personal grooming?: A Little Help from another person toileting, which includes using toliet, bedpan, or urinal?: A Lot Help from another person bathing (including washing, rinsing, drying)?: A Lot Help from another person to put on and taking off regular upper body clothing?: A Little Help from another person to put on and taking off regular lower body clothing?: A Lot 6 Click Score: 16   End of Session Equipment Utilized During Treatment: Gait belt Nurse Communication: Other (comment)  Activity Tolerance: Patient tolerated treatment well Patient left: in bed;with call bell/phone within reach;with bed alarm set;with SCD's reapplied  OT Visit Diagnosis: Muscle weakness (generalized) (M62.81);Other abnormalities of gait and mobility (R26.89)                Time: 2725-3664 OT Time Calculation (min): 24 min Charges:  OT General Charges $OT Visit: 1 Visit OT Evaluation $OT Eval Low Complexity: 1 Low  Korrey Schleicher L. Leopoldo Mazzie, OTR/L  10/10/23, 3:25 PM

## 2023-10-10 NOTE — Plan of Care (Signed)
  Problem: Activity: Goal: Risk for activity intolerance will decrease Outcome: Progressing   Problem: Coping: Goal: Level of anxiety will decrease Outcome: Progressing   Problem: Elimination: Goal: Will not experience complications related to urinary retention Outcome: Progressing   

## 2023-10-10 NOTE — Progress Notes (Signed)
Physical Therapy Treatment Patient Details Name: [name] MRN: [id number] DOB: [birth year] Today's Date: 10/10/2023   History of Present Illness Pt s/p fall with R hip fx and now s/p THR by anterior approach.  Pt with hx of CAD, CVA (2012), CABG, L THR and L TKR.    PT Comments  Pt continues cooperative but progressing slowly 2* c/o dizziness/fatigue.  BP 131/50. RN aware.  Will complete orthostatics at next session.    If plan is discharge home, recommend the following: A lot of help with walking and/or transfers;A lot of help with bathing/dressing/bathroom;Assistance with cooking/housework;Assist for transportation;Help with stairs or ramp for entrance   Can travel by private vehicle     No  Equipment Recommendations  None recommended by PT    Recommendations for Other Services       Precautions / Restrictions Precautions Precautions: Fall Restrictions Weight Bearing Restrictions: No RLE Weight Bearing: Weight bearing as tolerated     Mobility  Bed Mobility Overal bed mobility: Needs Assistance Bed Mobility: Sit to Supine     Supine to sit: Mod assist, +2 for safety/equipment, Used rails Sit to supine: Mod assist, +2 for physical assistance, +2 for safety/equipment   General bed mobility comments: Increased time with cues for use of L LE to self assist, use of rails, physical assist to control trunk and to assist LEs into bed    Transfers Overall transfer level: Needs assistance Equipment used: Rolling walker (2 wheels) Transfers: Sit to/from Stand, Bed to chair/wheelchair/BSC Sit to Stand: Min assist, +2 physical assistance, +2 safety/equipment   Step pivot transfers: Min assist, +2 physical assistance, +2 safety/equipment       General transfer comment: Cues for LE management and use of UEs to self assist.  Physical assist to bring wt up and fwd and to balance in standing with RW.  Step pvtt recliner to [facility] to bedside    Ambulation/Gait                General Gait Details: Transfers only 2* c/o dizziness   Stairs             Wheelchair Mobility     Tilt Bed    Modified Rankin (Stroke Patients Only)       Balance Overall balance assessment: Needs assistance Sitting-balance support: No upper extremity supported, Feet supported Sitting balance-Leahy Scale: Good     Standing balance support: Bilateral upper extremity supported Standing balance-Leahy Scale: Poor                              Cognition Arousal: Alert Behavior During Therapy: WFL for tasks assessed/performed Overall Cognitive Status: Within Functional Limits for tasks assessed                                          Exercises      General Comments        Pertinent Vitals/Pain Pain Assessment Pain Assessment: Faces Faces Pain Scale: Hurts a little bit Pain Location: R hip and knee Pain Descriptors / Indicators: Aching, Sore Pain Intervention(s): Limited activity within patient's tolerance, Monitored during session, Premedicated before session    Home Living Family/patient expects to be discharged to:: Unsure Living Arrangements: Spouse/significant other                 Additional Comments:  Pt lives with spouse who requires 24/7 care.    Prior Function            PT Goals (current goals can now be found in the care plan section) Acute Rehab PT Goals Patient Stated Goal: Regain IND PT Goal Formulation: With patient Time For Goal Achievement: 10/24/23 Potential to Achieve Goals: Good Progress towards PT goals: Progressing toward goals    Frequency    7X/week      PT Plan      Co-evaluation              AM-PAC PT "6 Clicks" Mobility   Outcome Measure  Help needed turning from your back to your side while in a flat bed without using bedrails?: A Lot Help needed moving from lying on your back to sitting on the side of a flat bed without using bedrails?: A Lot Help needed  moving to and from a bed to a chair (including a wheelchair)?: A Lot Help needed standing up from a chair using your arms (e.g., wheelchair or bedside chair)?: A Lot Help needed to walk in hospital room?: Total Help needed climbing 3-5 steps with a railing? : Total 6 Click Score: 10    End of Session Equipment Utilized During Treatment: Gait belt Activity Tolerance: Patient limited by fatigue Patient left: with call bell/phone within reach;in bed;with bed alarm set Nurse Communication: Mobility status PT Visit Diagnosis: Difficulty in walking, not elsewhere classified (R26.2)     Time: 1610-9604 PT Time Calculation (min) (ACUTE ONLY): 26 min  Charges:    $Therapeutic Activity: 8-22 mins PT General Charges $$ ACUTE PT VISIT: 1 Visit                     Mauro Kaufmann PT Acute Rehabilitation Services Pager [phone] Office [phone]    Trinton Prewitt 10/10/2023, 2:42 PM

## 2023-10-10 NOTE — Plan of Care (Signed)
  Problem: Education: Goal: Ability to describe self-care measures that may prevent or decrease complications (Diabetes Survival Skills Education) will improve Outcome: Progressing Goal: Individualized Educational Video(s) Outcome: Progressing   Problem: Coping: Goal: Ability to adjust to condition or change in health will improve Outcome: Progressing   

## 2023-10-10 NOTE — Evaluation (Signed)
Physical Therapy Evaluation Patient Details Name: [name] MRN: [id number] DOB: [birth year] Today's Date: 10/10/2023  History of Present Illness  Pt s/p fall with R hip fx and now s/p THR by anterior approach.  Pt with hx of CAD, CVA (2012), CABG, L THR and L TKR.  Clinical Impression  Pt admitted as above and presenting with functional mobility limitations 2* decreased R LE strength/ROM, post op pain and premorbid deconditioning.  Patient will benefit from continued inpatient follow up therapy, <3 hours/day to maximize IND and safety prior to return home with limited assist.         If plan is discharge home, recommend the following: A lot of help with walking and/or transfers;A lot of help with bathing/dressing/bathroom;Assistance with cooking/housework;Assist for transportation;Help with stairs or ramp for entrance   Can travel by private vehicle   No    Equipment Recommendations None recommended by PT  Recommendations for Other Services       Functional Status Assessment Patient has had a recent decline in their functional status and demonstrates the ability to make significant improvements in function in a reasonable and predictable amount of time.     Precautions / Restrictions Precautions Precautions: Fall Restrictions Weight Bearing Restrictions: No RLE Weight Bearing: Weight bearing as tolerated      Mobility  Bed Mobility Overal bed mobility: Needs Assistance Bed Mobility: Supine to Sit     Supine to sit: Mod assist, +2 for safety/equipment, Used rails     General bed mobility comments: Increased time with cues for use of L LE to self assist, use of rails and use of bed pad to complete rotation to EOB sitting    Transfers Overall transfer level: Needs assistance Equipment used: Rolling walker (2 wheels) Transfers: Sit to/from Stand, Bed to chair/wheelchair/BSC Sit to Stand: Min assist, +2 physical assistance, +2 safety/equipment   Step pivot  transfers: Min assist, +2 physical assistance, +2 safety/equipment       General transfer comment: Cues for LE management and use of UEs to self assist.  Physical assist to bring wt up and fwd and to balance in standing with RW.  Step pvt bed to [facility] to recliner    Ambulation/Gait               General Gait Details: Transfers only 2* c/o dizziness  Stairs            Wheelchair Mobility     Tilt Bed    Modified Rankin (Stroke Patients Only)       Balance Overall balance assessment: Needs assistance Sitting-balance support: No upper extremity supported, Feet supported Sitting balance-Leahy Scale: Good     Standing balance support: Bilateral upper extremity supported Standing balance-Leahy Scale: Poor                               Pertinent Vitals/Pain Pain Assessment Pain Assessment: Faces Faces Pain Scale: Hurts a little bit Pain Location: R hip and knee Pain Descriptors / Indicators: Aching, Sore Pain Intervention(s): Limited activity within patient's tolerance, Monitored during session, Premedicated before session, Ice applied    Home Living Family/patient expects to be discharged to:: Unsure Living Arrangements: Spouse/significant other                 Additional Comments: Pt lives with spouse who requires 24/7 care.    Prior Function Prior Level of Function : Independent/Modified Independent  Extremity/Trunk Assessment   Upper Extremity Assessment Upper Extremity Assessment: Defer to OT evaluation    Lower Extremity Assessment Lower Extremity Assessment: RLE deficits/detail    Cervical / Trunk Assessment Cervical / Trunk Assessment: Kyphotic  Communication   Communication Communication: Hearing impairment  Cognition Arousal: Alert Behavior During Therapy: WFL for tasks assessed/performed Overall Cognitive Status: Within Functional Limits for tasks assessed                                           General Comments      Exercises     Assessment/Plan    PT Assessment Patient needs continued PT services  PT Problem List Decreased strength;Decreased range of motion;Decreased activity tolerance;Decreased balance;Decreased mobility;Decreased knowledge of use of DME;Pain;Obesity       PT Treatment Interventions DME instruction;Gait training;Stair training;Functional mobility training;Therapeutic activities;Therapeutic exercise;Patient/family education    PT Goals (Current goals can be found in the Care Plan section)  Acute Rehab PT Goals Patient Stated Goal: Regain IND PT Goal Formulation: With patient Time For Goal Achievement: 10/24/23 Potential to Achieve Goals: Good    Frequency 7X/week     Co-evaluation               AM-PAC PT "6 Clicks" Mobility  Outcome Measure Help needed turning from your back to your side while in a flat bed without using bedrails?: A Lot Help needed moving from lying on your back to sitting on the side of a flat bed without using bedrails?: A Lot Help needed moving to and from a bed to a chair (including a wheelchair)?: A Lot Help needed standing up from a chair using your arms (e.g., wheelchair or bedside chair)?: A Lot Help needed to walk in hospital room?: Total Help needed climbing 3-5 steps with a railing? : Total 6 Click Score: 10    End of Session Equipment Utilized During Treatment: Gait belt Activity Tolerance: Patient limited by fatigue Patient left: in chair;with call bell/phone within reach;with chair alarm set;with family/visitor present Nurse Communication: Mobility status PT Visit Diagnosis: Difficulty in walking, not elsewhere classified (R26.2)    Time: 1027-2536 PT Time Calculation (min) (ACUTE ONLY): 35 min   Charges:   PT Evaluation $PT Eval Low Complexity: 1 Low PT Treatments $Therapeutic Activity: 8-22 mins PT General Charges $$ ACUTE PT VISIT: 1 Visit         Mauro Kaufmann PT Acute Rehabilitation Services Pager [phone] Office [phone]   Jensyn Shave 10/10/2023, 12:02 PM

## 2023-10-10 NOTE — TOC Progression Note (Signed)
Transition of Care [facility]) - Progression Note   Patient Details  Name: [name] MRN: [id number] Date of Birth: 1934/04/01  [facility]) CM/SW Contact  Ewing Schlein, LCSW Phone Number: 10/10/2023, 3:31 PM  Clinical Narrative: PT evaluation recommended SNF. Patient and daughter, [name], agreeable to short-term rehab. FL2 done; PASRR received. Initial referral faxed out. TOC awaiting bed offers.  Expected Discharge Plan: Skilled Nursing Facility Barriers to Discharge: Continued Medical Work up, SNF Pending bed offer  Expected Discharge Plan and Services In-house Referral: Clinical Social Work Post Acute Care Choice: Skilled Nursing Facility Living arrangements for the past 2 months: Single Family Home            DME Arranged: N/A DME Agency: NA  Social Determinants of Health (SDOH) Interventions SDOH Screenings   Food Insecurity: No Food Insecurity (10/07/2023)  Housing: Low Risk  (10/07/2023)  Transportation Needs: No Transportation Needs (10/07/2023)  Utilities: Not At Risk (10/07/2023)  Tobacco Use: Low Risk  (10/09/2023)   Readmission Risk Interventions     No data to display

## 2023-10-10 NOTE — NC FL2 (Signed)
[place] MEDICAID FL2 LEVEL OF CARE FORM     IDENTIFICATION  Patient Name: [name] Birthdate: June 21, 1934 Sex: female Admission Date (Current Location): 10/07/2023  [facility] and [place] Number:  Producer, television/film/video and Address:  [facility],  [address] [place], [place] [address]      Provider Number: [id number]  Attending Physician Name and Address:  Standley Brooking, MD  Relative Name and Phone Number:  Violet Baldy (daughter) Ph: [phone]    Current Level of Care: Hospital Recommended Level of Care: Skilled Nursing Facility Prior Approval Number:    Date Approved/Denied:   PASRR Number: [id number]  Discharge Plan: SNF    Current Diagnoses: Patient Active Problem List   Diagnosis Date Noted   Closed right hip fracture, initial encounter (HCC) 10/07/2023   CKD stage 3a, GFR 45-59 ml/min (HCC) 10/07/2023   History of stroke 10/07/2023   1st degree AV block 02/15/2022   Dizziness 02/15/2022   Syncope 02/15/2022   S/p left hip fracture 04/17/2019   Rheumatoid arthritis (HCC) 04/17/2019   DM (diabetes mellitus), type 2 (HCC) 04/17/2019   OA (osteoarthritis) of knee 09/11/2015   BPPV (benign paroxysmal positional vertigo) 10/31/2014   Complicated migraine 08/05/2014   TIA (transient ischemic attack) 08/04/2014   Coronary artery disease involving coronary bypass graft of native heart with angina pectoris (HCC) 11/02/2013   PAF (paroxysmal atrial fibrillation) (HCC) 11/02/2013   Essential hypertension, benign 11/02/2013   Long term current use of anticoagulant therapy 11/02/2013    Orientation RESPIRATION BLADDER Height & Weight     Self, Time, Situation, Place  O2 (2L/min) Incontinent Weight: 195 lb 1.7 oz (88.5 kg) Height:  5' (152.4 cm)  BEHAVIORAL SYMPTOMS/MOOD NEUROLOGICAL BOWEL NUTRITION STATUS      Continent Diet (Carb modified)  AMBULATORY STATUS COMMUNICATION OF NEEDS Skin   Extensive Assist Verbally Surgical wounds                        Personal Care Assistance Level of Assistance  Bathing, Feeding, Dressing Bathing Assistance: Maximum assistance Feeding assistance: Independent Dressing Assistance: Maximum assistance     Functional Limitations Info  Sight, Hearing, Speech Sight Info: Impaired Hearing Info: Impaired Speech Info: Adequate    SPECIAL CARE FACTORS FREQUENCY  PT (By licensed PT), OT (By licensed OT)     PT Frequency: 5x's/week OT Frequency: 5x's/week            Contractures Contractures Info: Not present    Additional Factors Info  Code Status, Allergies, Psychotropic, Insulin Sliding Scale Code Status Info: Full Allergies Info: Codeine, Penicillins, Acetaminophen-codeine, Atenolol, Atorvastatin, Chloramphenicol, Dapagliflozin, Empagliflozin, Liraglutide, Metformin Hcl, Metoprolol, Zetia (Ezetimibe), Sertraline Psychotropic Info: See discharge summary Insulin Sliding Scale Info: See discharge summary       Current Medications (10/10/2023):  This is the current hospital active medication list Current Facility-Administered Medications  Medication Dose Route Frequency Provider Last Rate Last Admin   acetaminophen (TYLENOL) tablet 1,000 mg  1,000 mg Oral Q6H Swinteck, Brian, MD   1,000 mg at 10/10/23 1156   acetaminophen (TYLENOL) tablet 325-650 mg  325-650 mg Oral Q6H PRN Swinteck, Arlys John, MD       acetaminophen (TYLENOL) tablet 650 mg  650 mg Oral Q6H PRN Samson Frederic, MD   650 mg at 10/08/23 2224   alum & mag hydroxide-simeth (MAALOX/MYLANTA) 200-200-20 MG/5ML suspension 30 mL  30 mL Oral Q4H PRN Samson Frederic, MD       carvedilol (COREG)  tablet 6.25 mg  6.25 mg Oral BID Samson Frederic, MD   6.25 mg at 10/10/23 2952   clonazePAM (KLONOPIN) tablet 0.5 mg  0.5 mg Oral QHS PRN Samson Frederic, MD   0.5 mg at 10/09/23 0451   dabigatran (PRADAXA) capsule 75 mg  75 mg Oral Q12H Swinteck, Arlys John, MD   75 mg at 10/10/23 0805   diphenhydrAMINE (BENADRYL) 12.5 MG/5ML elixir 12.5-25 mg   12.5-25 mg Oral Q4H PRN Swinteck, Arlys John, MD       docusate sodium (COLACE) capsule 100 mg  100 mg Oral BID Samson Frederic, MD   100 mg at 10/10/23 0806   feeding supplement (ENSURE ENLIVE / ENSURE PLUS) liquid 237 mL  237 mL Oral BID BM Swinteck, Arlys John, MD   237 mL at 10/08/23 1539   HYDROmorphone (DILAUDID) injection 0.5-1 mg  0.5-1 mg Intravenous Q4H PRN Swinteck, Arlys John, MD       insulin aspart (novoLOG) injection 0-5 Units  0-5 Units Subcutaneous QHS Samson Frederic, MD   2 Units at 10/08/23 2238   insulin aspart (novoLOG) injection 0-9 Units  0-9 Units Subcutaneous TID WC Samson Frederic, MD   2 Units at 10/10/23 1157   insulin aspart (novoLOG) injection 3 Units  3 Units Subcutaneous TID WC Samson Frederic, MD   3 Units at 10/10/23 1157   insulin glargine-yfgn (SEMGLEE) injection 30 Units  30 Units Subcutaneous BID Samson Frederic, MD   30 Units at 10/10/23 8413   isosorbide mononitrate (IMDUR) 24 hr tablet 15 mg  15 mg Oral Daily Swinteck, Arlys John, MD   15 mg at 10/10/23 2440   menthol-cetylpyridinium (CEPACOL) lozenge 3 mg  1 lozenge Oral PRN Samson Frederic, MD       Or   phenol (CHLORASEPTIC) mouth spray 1 spray  1 spray Mouth/Throat PRN Swinteck, Arlys John, MD       methocarbamol (ROBAXIN) tablet 500 mg  500 mg Oral Q6H PRN Samson Frederic, MD   500 mg at 10/08/23 1027   Or   methocarbamol (ROBAXIN) 500 mg in dextrose 5 % 50 mL IVPB  500 mg Intravenous Q6H PRN Swinteck, Arlys John, MD       methocarbamol (ROBAXIN) tablet 500 mg  500 mg Oral Q6H PRN Swinteck, Arlys John, MD       Or   methocarbamol (ROBAXIN) 500 mg in dextrose 5 % 50 mL IVPB  500 mg Intravenous Q6H PRN Swinteck, Arlys John, MD       multivitamin with minerals tablet 1 tablet  1 tablet Oral Daily Swinteck, Arlys John, MD   1 tablet at 10/10/23 0806   ondansetron (ZOFRAN) tablet 4 mg  4 mg Oral Q6H PRN Samson Frederic, MD   4 mg at 10/09/23 2216   Or   ondansetron (ZOFRAN) injection 4 mg  4 mg Intravenous Q6H PRN Swinteck, Arlys John, MD        oxyCODONE (Oxy IR/ROXICODONE) immediate release tablet 10-15 mg  10-15 mg Oral Q4H PRN Swinteck, Arlys John, MD       oxyCODONE (Oxy IR/ROXICODONE) immediate release tablet 5-10 mg  5-10 mg Oral Q4H PRN Samson Frederic, MD   10 mg at 10/10/23 0807   polyethylene glycol (MIRALAX / GLYCOLAX) packet 17 g  17 g Oral Daily PRN Swinteck, Arlys John, MD       rosuvastatin (CRESTOR) tablet 10 mg  10 mg Oral QHS Samson Frederic, MD   10 mg at 10/09/23 2216   senna (SENOKOT) tablet 8.6 mg  1 tablet Oral BID Samson Frederic, MD  8.6 mg at 10/10/23 7253     Discharge Medications: Please see discharge summary for a list of discharge medications.  Relevant Imaging Results:  Relevant Lab Results:   Additional Information SSN: [id number]  Ewing Schlein, LCSW

## 2023-10-10 NOTE — Progress Notes (Signed)
Progress Note   Patient: [name] AOZ:[id number] DOB: 23-Jul-1934 DOA: 10/07/2023     3 DOS: the patient was seen and examined on 10/10/2023   Brief hospital course: 89 year old woman PMH including atrial fibrillation on Pradaxa, CABG, rheumatoid arthritis, presented after a mechanical fall resulting in right hip pain.  Imaging revealed hip fracture.  Consultants Orthopedics  Cardiology   Procedures 10/10 Right total hip arthroplasty, anterior approach   Assessment and Plan: Right hip fracture status post mechanical fall Status post surgery 10/10 without apparent complication Pradaxa restarted.  Continue management as per orthopedics.  Plan for SNF.  Acute renal insufficiency Push oral fluids.  Discussed with patient. BMP in AM.  CAD  PMH CABG in 2006  Had NM PET on 08/26/23 for episodes of chest heaviness and there was a small area of apical lateral ischemia  Seen by cardiology, cleared for surgery Continue Coreg, Imdur, Crestor   Spironolactone held secondary to hyperkalemia.  Can resume on discharge.   Atrial fibrillation  Holding anticoagulation.  Continue carvedilol.   Insulin-dependent DM  CBG remains stable.  Continue Semglee, sliding scale insulin, meal coverage   Chronic diastolic CHF   Appears compensated.  Continue carvedilol.  Monitor volume status.  Aldactone on hold as above.   CKD 3A Stable.   Hyperkalemia -- resolved       Subjective:  On oxygen overnight Feels ok Not drinking enough  Physical Exam: Vitals:   10/10/23 0548 10/10/23 0926 10/10/23 1300 10/10/23 1730  BP: (!) 130/51 (!) 125/45 (!) 113/48 (!) 123/40  Pulse: 76 78 81 90  Resp: 18 18 13 17   Temp: (!) 97.5 F (36.4 C) (!) 97.4 F (36.3 C) 97.7 F (36.5 C) 97.9 F (36.6 C)  TempSrc:   Oral Oral  SpO2: 92% 90% 96% 95%  Weight:      Height:       Physical Exam Vitals reviewed.  Constitutional:      General: She is not in acute distress.    Appearance: She is not  ill-appearing or toxic-appearing.  Cardiovascular:     Rate and Rhythm: Normal rate and regular rhythm.     Heart sounds: No murmur heard. Pulmonary:     Effort: Pulmonary effort is normal. No respiratory distress.     Breath sounds: No wheezing, rhonchi or rales.  Musculoskeletal:     Comments: Moves both feet to command  Neurological:     Mental Status: She is alert.  Psychiatric:        Mood and Affect: Mood normal.        Behavior: Behavior normal.     Data Reviewed: CBG stable Na+ 131 BUN up to 33 Creatinine up to 1.24 Hgb stable 10.7  Family Communication: son Nida Boatman by telephone  Disposition: Status is: Inpatient Remains inpatient appropriate because: hip fx     Time spent: 25 minutes  Author: Brendia Sacks, MD 10/10/2023 6:03 PM  For on call review [email or web address].

## 2023-10-10 NOTE — Progress Notes (Signed)
    Subjective:  Patient reports pain as mild and only with movement.  Denies N/V/CP/SOB/Abd pain. She denies any tingling or numbness in LE bilaterally. Pain much improved since surgical intervention.  Son at bedside this morning we discussed PT coming to assess today and see about ambulation. All questions solicited and answered.   Objective:   VITALS:   Vitals:   10/09/23 2247 10/10/23 0158 10/10/23 0548 10/10/23 0926  BP: (!) 121/42 (!) 129/56 (!) 130/51 (!) 125/45  Pulse: 71 73 76 78  Resp: 18 18 18 18   Temp: 97.7 F (36.5 C) 97.8 F (36.6 C) (!) 97.5 F (36.4 C) (!) 97.4 F (36.3 C)  TempSrc:      SpO2: 91% 96% 92% 90%  Weight:      Height:        Patient lying in bed. NAD.  Neurologically intact ABD soft Neurovascular intact Sensation intact distally Intact pulses distally Dorsiflexion/Plantar flexion intact Incision: dressing C/D/I No cellulitis present Compartment soft Mild soreness with log rolling of the hip.   Lab Results  Component Value Date   WBC 8.7 10/10/2023   HGB 10.7 (L) 10/10/2023   HCT 33.2 (L) 10/10/2023   MCV 93.5 10/10/2023   PLT 125 (L) 10/10/2023   BMET    Component Value Date/Time   NA 131 (L) 10/10/2023 0329   NA 141 10/24/2020 1034   K 4.9 10/10/2023 0329   CL 102 10/10/2023 0329   CO2 20 (L) 10/10/2023 0329   GLUCOSE 220 (H) 10/10/2023 0329   BUN 33 (H) 10/10/2023 0329   BUN 22 10/24/2020 1034   CREATININE 1.24 (H) 10/10/2023 0329   CALCIUM 8.3 (L) 10/10/2023 0329   GFRNONAA 42 (L) 10/10/2023 0329     Assessment/Plan: 1 Day Post-Op   Principal Problem:   Closed right hip fracture, initial encounter (HCC) Active Problems:   Coronary artery disease involving coronary bypass graft of native heart with angina pectoris (HCC)   PAF (paroxysmal atrial fibrillation) (HCC)   Essential hypertension, benign   Rheumatoid arthritis (HCC)   DM (diabetes mellitus), type 2 (HCC)   CKD stage 3a, GFR 45-59 ml/min (HCC)   History  of stroke   WBAT with walker DVT ppx:  Pradaxa , SCDs, TEDS PO pain control PT/OT: PT has not seen yet. PT to come by today.  Dispo:  - Patient under care of the medical team, disposition per their recommendation. Pain medication printed in chart. Discussed with son to limit narcotic medications.    Clois Dupes, PA-C 10/10/2023, 10:50 AM   [facility]  [place] [address], [address], [place], [place] [address] Phone: [phone] [email or web address] Facebook  [employer]

## 2023-10-11 ENCOUNTER — Encounter (HOSPITAL_COMMUNITY): Payer: Self-pay | Admitting: Family Medicine

## 2023-10-11 DIAGNOSIS — S72001A Fracture of unspecified part of neck of right femur, initial encounter for closed fracture: Secondary | ICD-10-CM | POA: Diagnosis not present

## 2023-10-11 DIAGNOSIS — E1169 Type 2 diabetes mellitus with other specified complication: Secondary | ICD-10-CM | POA: Diagnosis not present

## 2023-10-11 DIAGNOSIS — N1831 Chronic kidney disease, stage 3a: Secondary | ICD-10-CM | POA: Diagnosis not present

## 2023-10-11 DIAGNOSIS — R0902 Hypoxemia: Secondary | ICD-10-CM

## 2023-10-11 DIAGNOSIS — I480 Paroxysmal atrial fibrillation: Secondary | ICD-10-CM | POA: Diagnosis not present

## 2023-10-11 LAB — CBC
HCT: 32.4 % — ABNORMAL LOW (ref 36.0–46.0)
Hemoglobin: 10.4 g/dL — ABNORMAL LOW (ref 12.0–15.0)
MCH: 30.5 pg (ref 26.0–34.0)
MCHC: 32.1 g/dL (ref 30.0–36.0)
MCV: 95 fL (ref 80.0–100.0)
Platelets: 139 10*3/uL — ABNORMAL LOW (ref 150–400)
RBC: 3.41 MIL/uL — ABNORMAL LOW (ref 3.87–5.11)
RDW: 12 % (ref 11.5–15.5)
WBC: 10.3 10*3/uL (ref 4.0–10.5)
nRBC: 0 % (ref 0.0–0.2)

## 2023-10-11 LAB — GLUCOSE, CAPILLARY
Glucose-Capillary: 153 mg/dL — ABNORMAL HIGH (ref 70–99)
Glucose-Capillary: 195 mg/dL — ABNORMAL HIGH (ref 70–99)
Glucose-Capillary: 194 mg/dL — ABNORMAL HIGH (ref 70–99)
Glucose-Capillary: 186 mg/dL — ABNORMAL HIGH (ref 70–99)

## 2023-10-11 LAB — BASIC METABOLIC PANEL
Anion gap: 10 (ref 5–15)
BUN: 47 mg/dL — ABNORMAL HIGH (ref 8–23)
CO2: 21 mmol/L — ABNORMAL LOW (ref 22–32)
Calcium: 8.9 mg/dL (ref 8.9–10.3)
Chloride: 104 mmol/L (ref 98–111)
Creatinine, Ser: 1.05 mg/dL — ABNORMAL HIGH (ref 0.44–1.00)
GFR, Estimated: 51 mL/min — ABNORMAL LOW (ref >60)
Glucose, Bld: 173 mg/dL — ABNORMAL HIGH (ref 70–99)
Potassium: 4.4 mmol/L (ref 3.5–5.1)
Sodium: 135 mmol/L (ref 135–145)

## 2023-10-11 MED ORDER — NITROGLYCERIN 0.4 MG SL SUBL: 0.4000 mg | SUBLINGUAL_TABLET | SUBLINGUAL | Status: DC | PRN

## 2023-10-11 NOTE — Progress Notes (Signed)
Progress Note   Patient: [name] ZOX:[id number] DOB: Oct 11, 1934 DOA: 10/07/2023     4 DOS: the patient was seen and examined on 10/11/2023   Brief hospital course: 89 year old woman PMH including atrial fibrillation on Pradaxa, CABG, rheumatoid arthritis, presented after a mechanical fall resulting in right hip pain.  Imaging revealed hip fracture.  Underwent right hip arthroplasty 10/10.  Plan for SNF.  Consultants Orthopedics  Cardiology   Procedures 10/10 Right total hip arthroplasty, anterior approach   Assessment and Plan: Right hip fracture status post mechanical fall Status post surgery 10/10 without apparent complication Pradaxa restarted at half dose.  Continue management as per orthopedics.  Plan for SNF. Discussed with Dr. Shelle Iron, will increase to full dose Pradaxa tomorrow.   Acute hypoxia without respiratory distress Exam is benign, SpO2 in the low to mid 90s on room air while awake.  Occurring in the context of sleep.  No history of sleep apnea. Echocardiogram 2023 was reassuring. Based on exam and history, suspect atelectasis is primary driver.  No signs or symptoms to suggest VTE or infection. Plan aggressive incentive spirometry, up to chair and movement throughout the day.  Acute renal insufficiency CKD stage 3A No evidence of AKI.  Renal function has improved with oral hydration. BMP in AM.   CAD  PMH CABG in 2006  Had NM PET on 08/26/23 for episodes of chest heaviness and there was a small area of apical lateral ischemia  Seen by cardiology, cleared for surgery Continue Coreg, Imdur, Crestor   Spironolactone held secondary to hyperkalemia.  Can resume on discharge.   Atrial fibrillation  Continue carvedilol.  Currently on anticoagulation at half dose.  Full dose tomorrow.   Insulin-dependent DM  CBG remains stable.  Continue Semglee, sliding scale insulin, meal coverage   Chronic diastolic CHF   Appears compensated.  No evidence of volume  overload.  Continue carvedilol.  Monitor volume status.  Aldactone on hold as above.      Hyperkalemia -- resolved      Subjective:  Per RN sats in mid-80s this AM on RA  Patient has no complaints this morning except that she really does not want to work with therapy or move.  Breathing okay.  No history of smoking.  No history of sleep apnea known.  Physical Exam: Vitals:   10/10/23 1730 10/10/23 2103 10/11/23 0522 10/11/23 0940  BP: (!) 123/40 (!) 120/44 (!) 136/58 (!) 155/48  Pulse: 90 83 91 90  Resp: 17 17 16 16   Temp: 97.9 F (36.6 C) 98.6 F (37 C) 99.4 F (37.4 C) 98.4 F (36.9 C)  TempSrc: Oral Oral Oral Oral  SpO2: 95% 93% 96% 97%  Weight:      Height:       Physical Exam Vitals reviewed.  Constitutional:      General: She is not in acute distress.    Appearance: She is not ill-appearing or toxic-appearing.  Cardiovascular:     Rate and Rhythm: Normal rate and regular rhythm.     Heart sounds: No murmur heard. Pulmonary:     Effort: Pulmonary effort is normal. No respiratory distress.     Breath sounds: No wheezing, rhonchi or rales.  Musculoskeletal:     Right lower leg: No edema.     Left lower leg: No edema.  Neurological:     Mental Status: She is alert.  Psychiatric:        Mood and Affect: Mood normal.  Behavior: Behavior normal.     Data Reviewed: Creatinine down to 1.05 Plts up to 139 Hgb stable 10.4  Family Communication: son Dr. Truett Perna by telephone  Disposition: Status is: Inpatient Remains inpatient appropriate because: hip fracture     Time spent: 35 minutes  Author: Brendia Sacks, MD 10/11/2023 10:01 AM  For on call review [email or web address].

## 2023-10-11 NOTE — Plan of Care (Signed)
  Problem: Education: Goal: Ability to describe self-care measures that may prevent or decrease complications (Diabetes Survival Skills Education) will improve Outcome: Progressing   Problem: Nutritional: Goal: Maintenance of adequate nutrition will improve Outcome: Progressing   Problem: Skin Integrity: Goal: Risk for impaired skin integrity will decrease Outcome: Progressing   Problem: Clinical Measurements: Goal: Ability to maintain clinical measurements within normal limits will improve Outcome: Progressing   Problem: Activity: Goal: Risk for activity intolerance will decrease Outcome: Progressing   Problem: Nutrition: Goal: Adequate nutrition will be maintained Outcome: Progressing   Problem: Coping: Goal: Level of anxiety will decrease Outcome: Progressing   Problem: Pain Managment: Goal: General experience of comfort will improve Outcome: Progressing   Problem: Safety: Goal: Ability to remain free from injury will improve Outcome: Progressing

## 2023-10-11 NOTE — Progress Notes (Signed)
Subjective: 2 Days Post-Op Procedure(s) (LRB): TOTAL HIP ARTHROPLASTY ANTERIOR APPROACH (Right) Patient reports pain as 3 on 0-10 scale.   Denies CP or SOB.  Voiding without difficulty. Positive flatus. Objective: Vital signs in last 24 hours: Temp:  [97.4 F (36.3 C)-99.4 F (37.4 C)] 99.4 F (37.4 C) (10/12 0522) Pulse Rate:  [78-91] 91 (10/12 0522) Resp:  [13-18] 16 (10/12 0522) BP: (113-136)/(40-58) 136/58 (10/12 0522) SpO2:  [90 %-96 %] 96 % (10/12 0522)  Intake/Output from previous day: 10/11 0701 - 10/12 0700 In: 860 [P.O.:630; I.V.:230] Out: 980 [Urine:980] Intake/Output this shift: No intake/output data recorded.  Recent Labs    10/09/23 0355 10/10/23 0329 10/11/23 0327  HGB 11.1* 10.7* 10.4*   Recent Labs    10/10/23 0329 10/11/23 0327  WBC 8.7 10.3  RBC 3.55* 3.41*  HCT 33.2* 32.4*  PLT 125* 139*   Recent Labs    10/10/23 0329 10/11/23 0327  NA 131* 135  K 4.9 4.4  CL 102 104  CO2 20* 21*  BUN 33* 47*  CREATININE 1.24* 1.05*  GLUCOSE 220* 173*  CALCIUM 8.3* 8.9   Recent Labs    10/09/23 1313  INR 1.2    Neurovascular intact Intact pulses distally Dorsiflexion/Plantar flexion intact No cellulitis present Compartment soft  Assessment/Plan:  2 Days Post-Op Procedure(s) (LRB): TOTAL HIP ARTHROPLASTY ANTERIOR APPROACH (Right) Advance diet On 3l. Was 86% on RA this AM. Will attempt to wean O2. Will discussed with Medicine  Plan to SNF when stable.   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 

## 2023-10-11 NOTE — Progress Notes (Signed)
Physical Therapy Treatment Patient Details Name: [name] MRN: [id number] DOB: December 08, 1934 Today's Date: 10/11/2023   History of Present Illness Patient is a 89 year old female who presented s/p fall with R hip fx and now s/p THR by anterior approach.  Pt with hx of CAD, CVA (2012), CABG, L THR and L TKR.    PT Comments  Pt requiring encouragement but remains cooperative.  Pt assisted from bed to [facility] and then ambulated very short distance to recliner.  Pt reports dizziness with mobility with BP supine125/53; sit 123/45; stand  116/60 and after transfer to recliner 111/45.  RN aware    If plan is discharge home, recommend the following: A lot of help with walking and/or transfers;A lot of help with bathing/dressing/bathroom;Assistance with cooking/housework;Assist for transportation;Help with stairs or ramp for entrance   Can travel by private vehicle     No  Equipment Recommendations  None recommended by PT    Recommendations for Other Services       Precautions / Restrictions Precautions Precautions: Fall Restrictions Weight Bearing Restrictions: No RLE Weight Bearing: Weight bearing as tolerated     Mobility  Bed Mobility Overal bed mobility: Needs Assistance Bed Mobility: Supine to Sit     Supine to sit: Mod assist, +2 for safety/equipment, Used rails     General bed mobility comments: Increased time with cues for use of L LE to self assist, use of rails, physical assist to control trunk and to assist LEs into bed    Transfers Overall transfer level: Needs assistance Equipment used: Rolling walker (2 wheels) Transfers: Sit to/from Stand, Bed to chair/wheelchair/BSC Sit to Stand: Min assist, +2 physical assistance, +2 safety/equipment   Step pivot transfers: +2 physical assistance, +2 safety/equipment, Min assist, Mod assist       General transfer comment: Cues for LE management and use of UEs to self assist.  Physical assist to bring wt up and fwd and to  balance in standing with RW.  Step pvtbed to [facility] to recliner    Ambulation/Gait Ambulation/Gait assistance: Min assist, Mod assist Gait Distance (Feet): 4 Feet Assistive device: Rolling walker (2 wheels) Gait Pattern/deviations: Step-to pattern, Decreased step length - right, Decreased step length - left, Shuffle, Antalgic, Knees buckling Gait velocity: decr     General Gait Details: cues for sequence, posture, position from RW; support 2* balance deficits and noted buckling at R LE   Stairs             Wheelchair Mobility     Tilt Bed    Modified Rankin (Stroke Patients Only)       Balance Overall balance assessment: Needs assistance Sitting-balance support: No upper extremity supported, Feet supported Sitting balance-Leahy Scale: Good     Standing balance support: Bilateral upper extremity supported Standing balance-Leahy Scale: Poor                              Cognition Arousal: Lethargic, Suspect due to medications Behavior During Therapy: Flat affect Overall Cognitive Status: Within Functional Limits for tasks assessed                                 General Comments: HOH at times during session, needed some encouragement to participate.        Exercises      General Comments  Pertinent Vitals/Pain Pain Assessment Pain Assessment: Faces Faces Pain Scale: Hurts whole lot Pain Location: R hip and knee Pain Descriptors / Indicators: Aching, Sore Pain Intervention(s): Limited activity within patient's tolerance, Monitored during session, Premedicated before session    Home Living                     Additional Comments: Pt lives with spouse who requires 24/7 care.    Prior Function            PT Goals (current goals can now be found in the care plan section) Acute Rehab PT Goals Patient Stated Goal: Regain IND PT Goal Formulation: With patient Time For Goal Achievement: 10/24/23 Potential to  Achieve Goals: Good Progress towards PT goals: Progressing toward goals    Frequency    7X/week      PT Plan      Co-evaluation              AM-PAC PT "6 Clicks" Mobility   Outcome Measure  Help needed turning from your back to your side while in a flat bed without using bedrails?: A Lot Help needed moving from lying on your back to sitting on the side of a flat bed without using bedrails?: A Lot Help needed moving to and from a bed to a chair (including a wheelchair)?: A Lot Help needed standing up from a chair using your arms (e.g., wheelchair or bedside chair)?: A Lot Help needed to walk in hospital room?: Total Help needed climbing 3-5 steps with a railing? : Total 6 Click Score: 10    End of Session Equipment Utilized During Treatment: Gait belt Activity Tolerance: Patient limited by fatigue Patient left: in chair;with call bell/phone within reach;with chair alarm set;with family/visitor present Nurse Communication: Mobility status PT Visit Diagnosis: Difficulty in walking, not elsewhere classified (R26.2)     Time: 8295-6213 PT Time Calculation (min) (ACUTE ONLY): 27 min  Charges:    $Gait Training: 8-22 mins PT General Charges $$ ACUTE PT VISIT: 1 Visit                     Mauro Kaufmann PT Acute Rehabilitation Services Pager [phone] Office [phone]    Shalise Rosado 10/11/2023, 3:40 PM

## 2023-10-11 NOTE — Progress Notes (Signed)
Subjective: 2 Days Post-Op Procedure(s) (LRB): TOTAL HIP ARTHROPLASTY ANTERIOR APPROACH (Right) Patient reports pain as {pain:3041404}.   Denies CP or SOB.  Voiding without difficulty. Positive flatus. Objective: Vital signs in last 24 hours: Temp:  [97.4 F (36.3 C)-99.4 F (37.4 C)] 99.4 F (37.4 C) (10/12 0522) Pulse Rate:  [78-91] 91 (10/12 0522) Resp:  [13-18] 16 (10/12 0522) BP: (113-136)/(40-58) 136/58 (10/12 0522) SpO2:  [90 %-96 %] 96 % (10/12 0522)  Intake/Output from previous day: 10/11 0701 - 10/12 0700 In: 860 [P.O.:630; I.V.:230] Out: 980 [Urine:980] Intake/Output this shift: No intake/output data recorded.  Recent Labs    10/09/23 0355 10/10/23 0329 10/11/23 0327  HGB 11.1* 10.7* 10.4*   Recent Labs    10/10/23 0329 10/11/23 0327  WBC 8.7 10.3  RBC 3.55* 3.41*  HCT 33.2* 32.4*  PLT 125* 139*   Recent Labs    10/10/23 0329 10/11/23 0327  NA 131* 135  K 4.9 4.4  CL 102 104  CO2 20* 21*  BUN 33* 47*  CREATININE 1.24* 1.05*  GLUCOSE 220* 173*  CALCIUM 8.3* 8.9   Recent Labs    10/09/23 1313  INR 1.2    {physical exam:3041401}  Assessment/Plan:  2 Days Post-Op Procedure(s) (LRB): TOTAL HIP ARTHROPLASTY ANTERIOR APPROACH (Right) {ZOXW:9604540}   Principal Problem:   Closed right hip fracture, initial encounter (HCC) Active Problems:   Coronary artery disease involving coronary bypass graft of native heart with angina pectoris (HCC)   PAF (paroxysmal atrial fibrillation) (HCC)   Essential hypertension, benign   Rheumatoid arthritis (HCC)   DM (diabetes mellitus), type 2 (HCC)   CKD stage 3a, GFR 45-59 ml/min (HCC)   History of stroke      Javier Docker 10/11/2023, @NOW 

## 2023-10-11 NOTE — Progress Notes (Signed)
Physical Therapy Treatment Patient Details Name: [name] MRN: [id number] DOB: 08/17/1934 Today's Date: 10/11/2023   History of Present Illness Patient is a 89 year old female who presented s/p fall with R hip fx and now s/p THR by anterior approach.  Pt with hx of CAD, CVA (2012), CABG, L THR and L TKR.    PT Comments  Pt continues cooperative but progressing slowly with mobility and notably with continued lethargy (? Meds) but with no c/o dizziness this pm.  BP supine 128/46; sit 138/46 and stand 158/62 - RN aware.   If plan is discharge home, recommend the following: A lot of help with walking and/or transfers;A lot of help with bathing/dressing/bathroom;Assistance with cooking/housework;Assist for transportation;Help with stairs or ramp for entrance   Can travel by private vehicle     No  Equipment Recommendations  None recommended by PT    Recommendations for Other Services       Precautions / Restrictions Precautions Precautions: Fall Restrictions Weight Bearing Restrictions: No RLE Weight Bearing: Weight bearing as tolerated     Mobility  Bed Mobility Overal bed mobility: Needs Assistance Bed Mobility: Sit to Supine     Supine to sit: Mod assist, +2 for safety/equipment, Used rails Sit to supine: Mod assist, +2 for physical assistance, +2 for safety/equipment   General bed mobility comments: Increased time with cues for use of L LE to self assist, use of rails, physical assist to control trunk and to assist LEs into bed    Transfers Overall transfer level: Needs assistance Equipment used: Rolling walker (2 wheels) Transfers: Sit to/from Stand, Bed to chair/wheelchair/BSC Sit to Stand: Min assist, +2 physical assistance, +2 safety/equipment   Step pivot transfers: +2 physical assistance, +2 safety/equipment, Min assist, Mod assist       General transfer comment: Cues for LE management and use of UEs to self assist.  Physical assist to bring wt up and  fwd and to balance in standing with RW.  Step pvt recliner to [facility] to bedside Transfer via Lift Equipment: Stedy  Ambulation/Gait Ambulation/Gait assistance: Min assist, Mod assist Gait Distance (Feet): 4 Feet Assistive device: Rolling walker (2 wheels) Gait Pattern/deviations: Step-to pattern, Decreased step length - right, Decreased step length - left, Shuffle, Antalgic, Knees buckling Gait velocity: decr     General Gait Details: cues for sequence, posture, position from RW; support 2* balance deficits and noted buckling at R LE   Stairs             Wheelchair Mobility     Tilt Bed    Modified Rankin (Stroke Patients Only)       Balance Overall balance assessment: Needs assistance Sitting-balance support: No upper extremity supported, Feet supported Sitting balance-Leahy Scale: Good     Standing balance support: Bilateral upper extremity supported Standing balance-Leahy Scale: Poor                              Cognition Arousal: Lethargic, Suspect due to medications Behavior During Therapy: Flat affect Overall Cognitive Status: Within Functional Limits for tasks assessed                                 General Comments: HOH at times during session, needed some encouragement to participate.        Exercises      General Comments  Pertinent Vitals/Pain Pain Assessment Pain Assessment: Faces Faces Pain Scale: Hurts even more Pain Location: R hip and knee Pain Descriptors / Indicators: Aching, Sore Pain Intervention(s): Limited activity within patient's tolerance, Monitored during session, Premedicated before session, Ice applied    Home Living                     Additional Comments: Pt lives with spouse who requires 24/7 care.    Prior Function            PT Goals (current goals can now be found in the care plan section) Acute Rehab PT Goals Patient Stated Goal: Regain IND PT Goal Formulation: With  patient Time For Goal Achievement: 10/24/23 Potential to Achieve Goals: Good Progress towards PT goals: Progressing toward goals    Frequency    7X/week      PT Plan      Co-evaluation              AM-PAC PT "6 Clicks" Mobility   Outcome Measure  Help needed turning from your back to your side while in a flat bed without using bedrails?: A Lot Help needed moving from lying on your back to sitting on the side of a flat bed without using bedrails?: A Lot Help needed moving to and from a bed to a chair (including a wheelchair)?: A Lot Help needed standing up from a chair using your arms (e.g., wheelchair or bedside chair)?: A Lot Help needed to walk in hospital room?: Total Help needed climbing 3-5 steps with a railing? : Total 6 Click Score: 10    End of Session Equipment Utilized During Treatment: Gait belt Activity Tolerance: Patient limited by fatigue Patient left: with call bell/phone within reach;with family/visitor present;in bed;with bed alarm set Nurse Communication: Mobility status PT Visit Diagnosis: Difficulty in walking, not elsewhere classified (R26.2)     Time: 4034-7425 PT Time Calculation (min) (ACUTE ONLY): 28 min  Charges:    $Gait Training: 8-22 mins $Therapeutic Activity: 8-22 mins PT General Charges $$ ACUTE PT VISIT: 1 Visit                     Mauro Kaufmann PT Acute Rehabilitation Services Pager [phone] Office [phone]    [name] 10/11/2023, 3:46 PM

## 2023-10-11 NOTE — Progress Notes (Addendum)
Occupational Therapy Treatment Patient Details Name: [name] MRN: [id number] DOB: 04-Jan-1934 Today's Date: 10/11/2023   History of present illness Patient is a 89 year old female who presented s/p fall with R hip fx and now s/p THR by anterior approach.  Pt with hx of CAD, CVA (2012), CABG, L THR and L TKR.   OT comments  Patient was in increased pain with transfers and patient reporting dizziness at rest and with movement. BP as noted below. Patient was able to maintain 98% on RA during session. Patient needed multimodal cues for remaining on feet to prevent patient from attempting to sit early. Patient is HOH making it difficult to communicate at times during session.  Patient will benefit from continued inpatient follow up therapy, <3 hours/day. Patient's discharge plan remains appropriate at this time. OT will continue to follow acutely.  Blood pressures: Supine: 126/53 mmhg HR 77bpm  Sitting 123/45 mmhg HR 84 bpm  Sitting in chair after toileting tasks: 111/45 mmhg HR 79 bpm       If plan is discharge home, recommend the following:  A lot of help with walking and/or transfers;A lot of help with bathing/dressing/bathroom;Assistance with cooking/housework;Assist for transportation;Help with stairs or ramp for entrance   Equipment Recommendations  None recommended by OT       Precautions / Restrictions Precautions Precautions: Fall Restrictions Weight Bearing Restrictions: Yes RLE Weight Bearing: Weight bearing as tolerated       Mobility Bed Mobility Overal bed mobility: Needs Assistance Bed Mobility: Sit to Supine     Supine to sit: Mod assist, +2 for safety/equipment, Used rails     General bed mobility comments: Increased time with cues for use of L LE to self assist, use of rails, physical assist to control trunk and to assist LEs into bed           Balance Overall balance assessment: Needs assistance Sitting-balance support: No upper extremity  supported, Feet supported Sitting balance-Leahy Scale: Good     Standing balance support: Bilateral upper extremity supported Standing balance-Leahy Scale: Poor         ADL either performed or assessed with clinical judgement   ADL Overall ADL's : Needs assistance/impaired   Toilet Transfer: Minimal assistance;+2 for safety/equipment;Cueing for sequencing;Cueing for safety;Stand-pivot;BSC/3in1;Rolling walker (2 wheels) Toilet Transfer Details (indicate cue type and reason): +2 for safety. patient was noted to need multimodal cues to maintain balance and complete transfers with patient impulsive to sit down. Toileting- Clothing Manipulation and Hygiene: Total assistance;Sit to/from stand Toileting - Clothing Manipulation Details (indicate cue type and reason): with patient needing BUE support for balance. patient noted to have small blisters on bottom with nurse made aware.              Cognition Arousal: Lethargic, Suspect due to medications Behavior During Therapy: Flat affect Overall Cognitive Status: Within Functional Limits for tasks assessed         General Comments: HOH at times during session, needed some encouragement to participate.                   Pertinent Vitals/ Pain       Pain Assessment Pain Assessment: Faces Faces Pain Scale: Hurts whole lot Pain Location: R hip and knee Pain Descriptors / Indicators: Aching, Sore Pain Intervention(s): Limited activity within patient's tolerance, Monitored during session, Repositioned, Patient requesting pain meds-RN notified, RN gave pain meds during session         Frequency  Min  1X/week        Progress Toward Goals  OT Goals(current goals can now be found in the care plan section)  Progress towards OT goals: Progressing toward goals     Plan         AM-PAC OT "6 Clicks" Daily Activity     Outcome Measure   Help from another person eating meals?: None Help from another person taking care of  personal grooming?: A Little Help from another person toileting, which includes using toliet, bedpan, or urinal?: A Lot Help from another person bathing (including washing, rinsing, drying)?: A Lot Help from another person to put on and taking off regular upper body clothing?: A Little Help from another person to put on and taking off regular lower body clothing?: A Lot 6 Click Score: 16    End of Session Equipment Utilized During Treatment: Gait belt;Rolling walker (2 wheels);Other (comment) (BSC)  OT Visit Diagnosis: Muscle weakness (generalized) (M62.81);Other abnormalities of gait and mobility (R26.89)   Activity Tolerance Patient tolerated treatment well   Patient Left with call bell/phone within reach;in chair;with chair alarm set;with family/visitor present   Nurse Communication Patient requests pain meds;Other (comment) (blisters on bottom)        Time: 1059-1130 OT Time Calculation (min): 31 min  Charges: OT General Charges $OT Visit: 1 Visit OT Treatments $Self Care/Home Management : 8-22 mins  Rosalio Loud, MS Acute Rehabilitation Department Office# [phone]   Selinda Flavin 10/11/2023, 1:17 PM

## 2023-10-12 DIAGNOSIS — I480 Paroxysmal atrial fibrillation: Secondary | ICD-10-CM | POA: Diagnosis not present

## 2023-10-12 DIAGNOSIS — E1169 Type 2 diabetes mellitus with other specified complication: Secondary | ICD-10-CM | POA: Diagnosis not present

## 2023-10-12 DIAGNOSIS — S72001A Fracture of unspecified part of neck of right femur, initial encounter for closed fracture: Secondary | ICD-10-CM | POA: Diagnosis not present

## 2023-10-12 DIAGNOSIS — N1831 Chronic kidney disease, stage 3a: Secondary | ICD-10-CM | POA: Diagnosis not present

## 2023-10-12 LAB — CBC
HCT: 31.2 % — ABNORMAL LOW (ref 36.0–46.0)
Hemoglobin: 9.9 g/dL — ABNORMAL LOW (ref 12.0–15.0)
MCH: 29.9 pg (ref 26.0–34.0)
MCHC: 31.7 g/dL (ref 30.0–36.0)
MCV: 94.3 fL (ref 80.0–100.0)
Platelets: 155 10*3/uL (ref 150–400)
RBC: 3.31 MIL/uL — ABNORMAL LOW (ref 3.87–5.11)
RDW: 12.1 % (ref 11.5–15.5)
WBC: 8.7 10*3/uL (ref 4.0–10.5)
nRBC: 0 % (ref 0.0–0.2)

## 2023-10-12 LAB — BASIC METABOLIC PANEL
Anion gap: 7 (ref 5–15)
BUN: 33 mg/dL — ABNORMAL HIGH (ref 8–23)
CO2: 24 mmol/L (ref 22–32)
Calcium: 8.7 mg/dL — ABNORMAL LOW (ref 8.9–10.3)
Chloride: 103 mmol/L (ref 98–111)
Creatinine, Ser: 0.9 mg/dL (ref 0.44–1.00)
GFR, Estimated: >60 mL/min (ref >60)
Glucose, Bld: 160 mg/dL — ABNORMAL HIGH (ref 70–99)
Potassium: 4.6 mmol/L (ref 3.5–5.1)
Sodium: 134 mmol/L — ABNORMAL LOW (ref 135–145)

## 2023-10-12 LAB — GLUCOSE, CAPILLARY
Glucose-Capillary: 130 mg/dL — ABNORMAL HIGH (ref 70–99)
Glucose-Capillary: 192 mg/dL — ABNORMAL HIGH (ref 70–99)
Glucose-Capillary: 168 mg/dL — ABNORMAL HIGH (ref 70–99)
Glucose-Capillary: 140 mg/dL — ABNORMAL HIGH (ref 70–99)

## 2023-10-12 MED ORDER — ACETAMINOPHEN 500 MG PO TABS
1000.0000 mg | ORAL_TABLET | Freq: Three times a day (TID) | ORAL | Status: DC
  Administered 2023-10-12 – 2023-10-13 (×3): 1000 mg via ORAL
  Filled 2023-10-12 (×3): qty 2

## 2023-10-12 MED ORDER — DABIGATRAN ETEXILATE MESYLATE 150 MG PO CAPS
150.0000 mg | ORAL_CAPSULE | Freq: Two times a day (BID) | ORAL | Status: DC
  Administered 2023-10-12 – 2023-10-13 (×2): 150 mg via ORAL
  Filled 2023-10-12 (×2): qty 1

## 2023-10-12 MED ORDER — OXYCODONE HCL 5 MG PO TABS: 5.0000 mg | ORAL_TABLET | ORAL | Status: DC | PRN

## 2023-10-12 MED ORDER — OXYCODONE HCL 5 MG PO TABS: 10.0000 mg | ORAL_TABLET | ORAL | Status: DC | PRN

## 2023-10-12 NOTE — Progress Notes (Signed)
Progress Note   Patient: [name] ION:[id number] DOB: 22-May-1934 DOA: 10/07/2023     5 DOS: the patient was seen and examined on 10/12/2023   Brief hospital course: 89 year old woman PMH including atrial fibrillation on Pradaxa, CABG, rheumatoid arthritis, presented after a mechanical fall resulting in right hip pain.  Imaging revealed hip fracture.  Underwent right hip arthroplasty 10/10.  Plan for SNF.  Consultants Orthopedics  Cardiology   Procedures 10/10 Right total hip arthroplasty, anterior approach   Assessment and Plan: Right hip fracture status post mechanical fall Status post surgery 10/10 without apparent complication Continue management as per orthopedics.  Plan for SNF.   Acute hypoxia without respiratory distress Occurring in the context of sleep.  No history of sleep apnea.  Consider outpatient sleep evaluation. Echocardiogram 2023 was reassuring. Based on exam and history, suspect atelectasis is primary driver.  No signs or symptoms to suggest VTE or infection. Continue aggressive incentive spirometry, up to chair and movement throughout the day.   Acute renal insufficiency -- resolved CKD stage IIIa No evidence of AKI.  Renal function has improved with oral hydration.   CAD  PMH CABG in 2006  Had NM PET on 08/26/23 for episodes of chest heaviness and there was a small area of apical lateral ischemia  Seen by cardiology, cleared for surgery Continue Coreg, Imdur, Crestor   Spironolactone held secondary to hyperkalemia.  Can resume on discharge. Stable.   Atrial fibrillation  Continue carvedilol.  Resume full dose Pradaxa.     Insulin-dependent DM  CBG remains stable.  Continue Semglee, sliding scale insulin, meal coverage   Chronic diastolic CHF   Appears stable.  Minor ankle edema.  Given recent renal insufficiency will monitor for now. Continue carvedilol.  Aldactone on hold as above.  Plan for SNF tomorrow likely.     Subjective:  Better  today Tolerating diet Drinking more  Physical Exam: Vitals:   10/11/23 1356 10/11/23 2044 10/12/23 0450 10/12/23 0824  BP: (!) 134/46 (!) 148/55 (!) 127/50 (!) 123/56  Pulse: 83 96 75   Resp: 16 16 16    Temp: 97.6 F (36.4 C) 98 F (36.7 C) 97.8 F (36.6 C)   TempSrc:      SpO2: 100% 92% 98%   Weight:      Height:       Physical Exam Vitals reviewed.  Constitutional:      General: She is not in acute distress.    Appearance: She is not ill-appearing or toxic-appearing.     Comments: Sitting in chair eating lunch  Cardiovascular:     Rate and Rhythm: Normal rate and regular rhythm.     Heart sounds: No murmur heard. Pulmonary:     Effort: Pulmonary effort is normal. No respiratory distress.     Breath sounds: No wheezing, rhonchi or rales.  Neurological:     Mental Status: She is alert.  Psychiatric:        Mood and Affect: Mood normal.        Behavior: Behavior normal.     Data Reviewed: CBG stable BMP noted Hgb stable  Family Communication: daughter at bedside  Disposition: Status is: Inpatient Remains inpatient appropriate because: hip fracture     Time spent: 20 minutes  Author: Brendia Sacks, MD 10/12/2023 9:08 AM  For on call review [email or web address].

## 2023-10-12 NOTE — Progress Notes (Signed)
Physical Therapy Treatment Patient Details Name: [name] MRN: [id number] DOB: 05/17/1934 Today's Date: 10/12/2023   History of Present Illness Patient is a 89 year old female who presented s/p fall with R hip fx and now s/p THR by anterior approach.  Pt with hx of CAD, CVA (2012), CABG, L THR and L TKR.    PT Comments  Pt continues cooperative and with marked improvement in activity tolerance this pm.  Pt [facility] to [facility] for toileting and up to ambulate significantly further in hall.  Pt fatigued following but pleased with progress.    If plan is discharge home, recommend the following: A lot of help with walking and/or transfers;A lot of help with bathing/dressing/bathroom;Assistance with cooking/housework;Assist for transportation;Help with stairs or ramp for entrance   Can travel by private vehicle     No  Equipment Recommendations  None recommended by PT    Recommendations for Other Services       Precautions / Restrictions Precautions Precautions: Fall Restrictions Weight Bearing Restrictions: No RLE Weight Bearing: Weight bearing as tolerated     Mobility  Bed Mobility Overal bed mobility: Needs Assistance Bed Mobility: Sit to Supine     Supine to sit: Min assist, Mod assist Sit to supine: Mod assist, +2 for physical assistance, +2 for safety/equipment   General bed mobility comments: Increased time with cues for sequence; physical assist to manage LEs and to control trunk    Transfers Overall transfer level: Needs assistance Equipment used: Rolling walker (2 wheels) Transfers: Sit to/from Stand, Bed to chair/wheelchair/BSC Sit to Stand: Min assist, +2 physical assistance, +2 safety/equipment   Step pivot transfers: +2 physical assistance, +2 safety/equipment, Min assist       General transfer comment: Cues for LE management and use of UEs to self assist.  Physical assist to bring wt up and fwd and to balance in standing with RW.  Step pvt bed to [facility]     Ambulation/Gait Ambulation/Gait assistance: Min assist Gait Distance (Feet): 30 Feet (and additional 14') Assistive device: Rolling walker (2 wheels) Gait Pattern/deviations: Step-to pattern, Decreased step length - right, Decreased step length - left, Shuffle, Antalgic, Knees buckling Gait velocity: decr     General Gait Details: cues for sequence, posture, position from RW; support 2* balance deficits   Stairs             Wheelchair Mobility     Tilt Bed    Modified Rankin (Stroke Patients Only)       Balance Overall balance assessment: Needs assistance Sitting-balance support: No upper extremity supported, Feet supported Sitting balance-Leahy Scale: Good     Standing balance support: Bilateral upper extremity supported Standing balance-Leahy Scale: Poor                              Cognition Arousal: Alert   Overall Cognitive Status: Within Functional Limits for tasks assessed                                 General Comments: HOH at times during session, needed some encouragement to participate.        Exercises      General Comments        Pertinent Vitals/Pain Pain Assessment Pain Assessment: Faces Faces Pain Scale: Hurts little more Pain Location: R hip and knee Pain Descriptors / Indicators: Aching, Sore Pain Intervention(s):  Limited activity within patient's tolerance, Monitored during session, Premedicated before session    Home Living                          Prior Function            PT Goals (current goals can now be found in the care plan section) Acute Rehab PT Goals Patient Stated Goal: Regain IND PT Goal Formulation: With patient Time For Goal Achievement: 10/24/23 Potential to Achieve Goals: Good Progress towards PT goals: Progressing toward goals    Frequency    7X/week      PT Plan      Co-evaluation              AM-PAC PT "6 Clicks" Mobility   Outcome  Measure  Help needed turning from your back to your side while in a flat bed without using bedrails?: A Lot Help needed moving from lying on your back to sitting on the side of a flat bed without using bedrails?: A Lot Help needed moving to and from a bed to a chair (including a wheelchair)?: A Lot Help needed standing up from a chair using your arms (e.g., wheelchair or bedside chair)?: A Little Help needed to walk in hospital room?: A Lot Help needed climbing 3-5 steps with a railing? : Total 6 Click Score: 12    End of Session Equipment Utilized During Treatment: Gait belt Activity Tolerance: Patient tolerated treatment well;Patient limited by fatigue Patient left: in bed;with call bell/phone within reach;with bed alarm set Nurse Communication: Mobility status PT Visit Diagnosis: Difficulty in walking, not elsewhere classified (R26.2)     Time: 4540-9811 PT Time Calculation (min) (ACUTE ONLY): 26 min  Charges:    $Gait Training: 8-22 mins $Therapeutic Activity: 8-22 mins PT General Charges $$ ACUTE PT VISIT: 1 Visit                     Mauro Kaufmann PT Acute Rehabilitation Services Pager [phone] Office [phone]    [name] 10/12/2023, 2:08 PM

## 2023-10-12 NOTE — TOC Progression Note (Addendum)
Transition of Care [facility]) - Progression Note    Patient Details  Name: [name] MRN: [id number] Date of Birth: 11-24-1934  [facility] CM/SW Contact  Otelia Santee, LCSW Phone Number: 10/12/2023, 1:39 PM  Clinical Narrative:    Met with pt and daughter in room to review bed offers for SNF. List of facilities w/ Medicare star ratings provided to pt for review. Pt and daughter have accepted bed offer for [facility]. Per Lowella Bandy w/ [facility], no current beds available however, they have several planned discharges from their facility tomorrow.    Expected Discharge Plan: Skilled Nursing Facility Barriers to Discharge: Continued Medical Work up, SNF Pending bed offer  Expected Discharge Plan and Services In-house Referral: Clinical Social Work   Post Acute Care Choice: Skilled Nursing Facility Living arrangements for the past 2 months: Single Family Home                 DME Arranged: N/A DME Agency: NA                   Social Determinants of Health (SDOH) Interventions SDOH Screenings   Food Insecurity: No Food Insecurity (10/07/2023)  Housing: Low Risk  (10/07/2023)  Transportation Needs: No Transportation Needs (10/07/2023)  Utilities: Not At Risk (10/07/2023)  Tobacco Use: Low Risk  (10/09/2023)    Readmission Risk Interventions     No data to display

## 2023-10-12 NOTE — Progress Notes (Signed)
Subjective: 3 Days Post-Op Procedure(s) (LRB): TOTAL HIP ARTHROPLASTY ANTERIOR APPROACH (Right) Patient reports pain as mild.   Patient seen in rounds for Dr. Linna Caprice. Patient is resting in bed on exam this morning. No acute events overnight. Her daughter was at the bedside with her, and she reports her son, Dr. Truett Perna, came by today as well. She was a bit lethargic per PT notes yesterday, so I would recommend we limit pain medication as able.  We will start therapy today.   Objective: Vital signs in last 24 hours: Temp:  [97.6 F (36.4 C)-98.2 F (36.8 C)] 98.2 F (36.8 C) (10/13 0945) Pulse Rate:  [75-96] 75 (10/13 0450) Resp:  [16] 16 (10/13 0824) BP: (123-148)/(46-56) 123/56 (10/13 0824) SpO2:  [92 %-100 %] 94 % (10/13 0824)  Intake/Output from previous day:  Intake/Output Summary (Last 24 hours) at 10/12/2023 1007 Last data filed at 10/12/2023 0105 Gross per 24 hour  Intake 240 ml  Output 800 ml  Net -560 ml     Intake/Output this shift: No intake/output data recorded.  Labs: Recent Labs    10/10/23 0329 10/11/23 0327 10/12/23 0338  HGB 10.7* 10.4* 9.9*   Recent Labs    10/11/23 0327 10/12/23 0338  WBC 10.3 8.7  RBC 3.41* 3.31*  HCT 32.4* 31.2*  PLT 139* 155   Recent Labs    10/11/23 0327 10/12/23 0338  NA 135 134*  K 4.4 4.6  CL 104 103  CO2 21* 24  BUN 47* 33*  CREATININE 1.05* 0.90  GLUCOSE 173* 160*  CALCIUM 8.9 8.7*   Recent Labs    10/09/23 1313  INR 1.2    Exam: General - Patient is Alert and Oriented Extremity - Neurologically intact Sensation intact distally Intact pulses distally Dorsiflexion/Plantar flexion intact Dressing - dressing C/D/I Motor Function - intact, moving foot and toes well on exam.   Past Medical History:  Diagnosis Date   Anginal pain (HCC)    "saw doctor-think it was esophagus spasm"   Arthritis    Atrial flutter (HCC) 11/02/2013   Cataract    left eye   Coronary artery disease    Diabetes  mellitus without complication (HCC)    type 2   Essential hypertension, benign 11/02/2013   H/O jaundice    as child   Headache 2015   complicated migraine x2    Hearing trouble    Heart disease    Hepatitis    hx of hepatitis A as child   High cholesterol    under control   Long term (current) use of anticoagulants 11/02/2013   Pradaxa    Measles    as child   Mouth dryness    Mumps    as teenager   Numbness and tingling    lips   Osteoporosis    "unsure-maybe in knees"   Psoriasis    Psoriatic arthritis (HCC)    Stroke (HCC) 2012   Vertigo    being monitored, occasional    Assessment/Plan: 3 Days Post-Op Procedure(s) (LRB): TOTAL HIP ARTHROPLASTY ANTERIOR APPROACH (Right) Principal Problem:   Closed right hip fracture, initial encounter (HCC) Active Problems:   Coronary artery disease involving coronary bypass graft of native heart with angina pectoris (HCC)   PAF (paroxysmal atrial fibrillation) (HCC)   Essential hypertension, benign   Rheumatoid arthritis (HCC)   DM (diabetes mellitus), type 2 (HCC)   CKD stage 3a, GFR 45-59 ml/min (HCC)   History of stroke  Estimated body mass  index is 38.1 kg/m as calculated from the following:   Height as of this encounter: 5' (1.524 m).   Weight as of this encounter: 88.5 kg. Advance diet Up with therapy  DVT Prophylaxis -  Pradaxa Weight bearing as tolerated.  Hgb stable at 9.9 this AM.  Would recommend trying to use oxycodone 5 rather than 10 to avoid confusion/lethargy. Will schedule tylenol as well.   Awaiting SNF bed.   Rosalene Billings, PA-C Orthopedic Surgery [phone] 10/12/2023, 10:07 AM

## 2023-10-12 NOTE — Progress Notes (Signed)
Physical Therapy Treatment Patient Details Name: [name] MRN: [id number] DOB: May 31, 1934 Today's Date: 10/12/2023   History of Present Illness Patient is a 89 year old female who presented s/p fall with R hip fx and now s/p THR by anterior approach.  Pt with hx of CAD, CVA (2012), CABG, L THR and L TKR.    PT Comments  Pt continues cooperative and progressing slowly with mobility including ambulating increased distance in room and with noted decreased R LE buckling.      If plan is discharge home, recommend the following: A lot of help with walking and/or transfers;A lot of help with bathing/dressing/bathroom;Assistance with cooking/housework;Assist for transportation;Help with stairs or ramp for entrance   Can travel by private vehicle     No  Equipment Recommendations  None recommended by PT    Recommendations for Other Services       Precautions / Restrictions Precautions Precautions: Fall Restrictions Weight Bearing Restrictions: No RLE Weight Bearing: Weight bearing as tolerated     Mobility  Bed Mobility Overal bed mobility: Needs Assistance Bed Mobility: Supine to Sit     Supine to sit: Min assist, Mod assist     General bed mobility comments: Increased time with cues for sequence    Transfers Overall transfer level: Needs assistance Equipment used: Rolling walker (2 wheels) Transfers: Sit to/from Stand, Bed to chair/wheelchair/BSC Sit to Stand: Min assist, +2 physical assistance, +2 safety/equipment   Step pivot transfers: +2 physical assistance, +2 safety/equipment, Min assist, Mod assist       General transfer comment: Cues for LE management and use of UEs to self assist.  Physical assist to bring wt up and fwd and to balance in standing with RW.  Step pvt bed to [facility]    Ambulation/Gait Ambulation/Gait assistance: Min assist, Mod assist Gait Distance (Feet): 9 Feet Assistive device: Rolling walker (2 wheels) Gait Pattern/deviations: Step-to  pattern, Decreased step length - right, Decreased step length - left, Shuffle, Antalgic, Knees buckling Gait velocity: decr     General Gait Details: cues for sequence, posture, position from RW; support 2* balance deficits   Stairs             Wheelchair Mobility     Tilt Bed    Modified Rankin (Stroke Patients Only)       Balance Overall balance assessment: Needs assistance Sitting-balance support: No upper extremity supported, Feet supported Sitting balance-Leahy Scale: Good     Standing balance support: Bilateral upper extremity supported Standing balance-Leahy Scale: Poor                              Cognition Arousal: Alert   Overall Cognitive Status: Within Functional Limits for tasks assessed                                 General Comments: HOH at times during session, needed some encouragement to participate.        Exercises      General Comments        Pertinent Vitals/Pain Pain Assessment Pain Assessment: Faces Faces Pain Scale: Hurts little more Pain Location: R hip and knee Pain Descriptors / Indicators: Aching, Sore Pain Intervention(s): Limited activity within patient's tolerance, Monitored during session, Premedicated before session    Home Living  Prior Function            PT Goals (current goals can now be found in the care plan section) Acute Rehab PT Goals Patient Stated Goal: Regain IND PT Goal Formulation: With patient Time For Goal Achievement: 10/24/23 Potential to Achieve Goals: Good Progress towards PT goals: Progressing toward goals    Frequency    7X/week      PT Plan      Co-evaluation              AM-PAC PT "6 Clicks" Mobility   Outcome Measure  Help needed turning from your back to your side while in a flat bed without using bedrails?: A Lot Help needed moving from lying on your back to sitting on the side of a flat bed without  using bedrails?: A Lot Help needed moving to and from a bed to a chair (including a wheelchair)?: A Lot Help needed standing up from a chair using your arms (e.g., wheelchair or bedside chair)?: A Lot Help needed to walk in hospital room?: Total Help needed climbing 3-5 steps with a railing? : Total 6 Click Score: 10    End of Session Equipment Utilized During Treatment: Gait belt Activity Tolerance: Patient limited by fatigue Patient left: with call bell/phone within reach;with family/visitor present;in bed;with bed alarm set Nurse Communication: Mobility status PT Visit Diagnosis: Difficulty in walking, not elsewhere classified (R26.2)     Time: 4782-9562 PT Time Calculation (min) (ACUTE ONLY): 27 min  Charges:    $Gait Training: 8-22 mins $Therapeutic Activity: 8-22 mins PT General Charges $$ ACUTE PT VISIT: 1 Visit                     Mauro Kaufmann PT Acute Rehabilitation Services Pager [phone] Office [phone]    Schuyler Olden 10/12/2023, 12:44 PM

## 2023-10-12 NOTE — TOC CM/SW Note (Signed)
[facility] [address] [place], [place] [address] [phone] Overall rating ??  Below average  [facility] [address] [place], [place] [address] [phone] Overall rating ?? Below average  [facility] [address] [place], [place] [address] [phone] Overall rating?? Below average  [facility] [address] [place], [place] [address] [phone] Overall rating ? Much below average  [facility] [address] [place], [place] [address] [phone] Overall rating ??? Average  [facility] [address] [place], [place] [address] [phone] Overall rating ? Much below average [facility] [address] [place], [place] [address] [phone] Overall rating ? Much below average  [employer] [facility] [employer] [address] [place], [place] [address] [phone] Overall rating ??? Average  [facility] [address] [place], [place] [address] [phone] Overall rating ? Much below average  [facility] [address] [place], [place] [address] [phone] Overall rating ?? Much below average  [facility] [address] [place], [place] [address] [phone] Overall rating ???? Above average  [facility] [address] [place], [place] [address] [phone] Overall rating ????? Much above average [facility] [address] [place], [place] [address] [phone] Overall rating ???? Above average  [facility] [address] [place], [place] [address] [phone] Overall rating ????? Much above average  [facility] [address] [place], [place] [address] [phone] Overall rating ????  [facility] [address] [place], [place] [address] [phone] Overall rating ????? Much above average  [facility] [address] [place], [place] [address] [phone] Overall rating ????? Much above average  [facility] [address] [place], [place] [address] [phone] Overall rating ? Much below average  [facility] [address] [place], [place] [address] [phone] Overall rating ??? Average  [facility] [address] [place], [place] [address] [phone] Overall rating ????? Much above average  [facility] [address] [place], [place] [address] [phone] Overall rating ??? Average  [facility] [address] [place], [place] [address] [phone] Overall rating ?? Below average  [facility] [address] [place], [place] [address] [phone] Overall rating ??? Average  [facility] [address] [place], [place] [address] [phone] Overall rating ????? Much above average  [facility] [address] [place], [place] [address] [phone] Overall rating ? Much below average  [facility]	[place] [facility], [place] [address] [phone] Overall rating ????? Much above average  [facility] [address] [place], [place] [address] [phone] Overall rating ? Much below average  [facility] [address] [place], [place] [address] [phone] Overall rating ? Much below average  [facility] [address] [place], [place] [address] [phone] Overall rating ????? Much  above average       [facility] [address] [place], [place] [address] [phone] Overall  rating ??? Average  [facility] [address] [place], [place] [address] [phone] Overall rating ?? Below average  [facility] [address] [place], [place] [address] [phone] Overall rating ? Much below average  [facility] [address] [place], [place] [address] [phone] Overall rating ??? Average  [facility] [address] [place], [place] [address] [phone] Overall rating ? Much below average  [facility] [address] [place], [place] [address] [phone] Overall rating ??? Average [facility] [address] [place], [place] [address] [phone] Overall rating ? Much below average  [employer] [address] [place], [place] [address] [phone] Overall rating ????? Much above average  [facility] [address] [place], [place] [address] [phone] Overall rating ? Much below average  [facility] [address] [place], [place] [address] [phone] Overall rating ???? Above average  [facility] [address] [place], [place] [address] [phone] Overall rating ????? Above average

## 2023-10-13 DIAGNOSIS — M069 Rheumatoid arthritis, unspecified: Secondary | ICD-10-CM | POA: Diagnosis not present

## 2023-10-13 DIAGNOSIS — Z9181 History of falling: Secondary | ICD-10-CM | POA: Diagnosis not present

## 2023-10-13 DIAGNOSIS — E7800 Pure hypercholesterolemia, unspecified: Secondary | ICD-10-CM | POA: Diagnosis not present

## 2023-10-13 DIAGNOSIS — Z96642 Presence of left artificial hip joint: Secondary | ICD-10-CM | POA: Diagnosis not present

## 2023-10-13 DIAGNOSIS — Z7902 Long term (current) use of antithrombotics/antiplatelets: Secondary | ICD-10-CM | POA: Diagnosis not present

## 2023-10-13 DIAGNOSIS — E1169 Type 2 diabetes mellitus with other specified complication: Secondary | ICD-10-CM | POA: Diagnosis not present

## 2023-10-13 DIAGNOSIS — Z8249 Family history of ischemic heart disease and other diseases of the circulatory system: Secondary | ICD-10-CM | POA: Diagnosis not present

## 2023-10-13 DIAGNOSIS — R2689 Other abnormalities of gait and mobility: Secondary | ICD-10-CM | POA: Diagnosis not present

## 2023-10-13 DIAGNOSIS — I5032 Chronic diastolic (congestive) heart failure: Secondary | ICD-10-CM | POA: Diagnosis present

## 2023-10-13 DIAGNOSIS — M810 Age-related osteoporosis without current pathological fracture: Secondary | ICD-10-CM | POA: Diagnosis not present

## 2023-10-13 DIAGNOSIS — Z7401 Bed confinement status: Secondary | ICD-10-CM | POA: Diagnosis not present

## 2023-10-13 DIAGNOSIS — S72144D Nondisplaced intertrochanteric fracture of right femur, subsequent encounter for closed fracture with routine healing: Secondary | ICD-10-CM | POA: Diagnosis not present

## 2023-10-13 DIAGNOSIS — Z6837 Body mass index (BMI) 37.0-37.9, adult: Secondary | ICD-10-CM | POA: Diagnosis not present

## 2023-10-13 DIAGNOSIS — L4050 Arthropathic psoriasis, unspecified: Secondary | ICD-10-CM | POA: Diagnosis not present

## 2023-10-13 DIAGNOSIS — Z741 Need for assistance with personal care: Secondary | ICD-10-CM | POA: Diagnosis not present

## 2023-10-13 DIAGNOSIS — Z96641 Presence of right artificial hip joint: Secondary | ICD-10-CM | POA: Diagnosis not present

## 2023-10-13 DIAGNOSIS — N1831 Chronic kidney disease, stage 3a: Secondary | ICD-10-CM | POA: Diagnosis not present

## 2023-10-13 DIAGNOSIS — I2510 Atherosclerotic heart disease of native coronary artery without angina pectoris: Secondary | ICD-10-CM | POA: Diagnosis not present

## 2023-10-13 DIAGNOSIS — I959 Hypotension, unspecified: Secondary | ICD-10-CM | POA: Diagnosis not present

## 2023-10-13 DIAGNOSIS — R42 Dizziness and giddiness: Secondary | ICD-10-CM | POA: Diagnosis not present

## 2023-10-13 DIAGNOSIS — M9701XA Periprosthetic fracture around internal prosthetic right hip joint, initial encounter: Secondary | ICD-10-CM | POA: Diagnosis present

## 2023-10-13 DIAGNOSIS — R41841 Cognitive communication deficit: Secondary | ICD-10-CM | POA: Diagnosis not present

## 2023-10-13 DIAGNOSIS — S72001A Fracture of unspecified part of neck of right femur, initial encounter for closed fracture: Secondary | ICD-10-CM | POA: Diagnosis not present

## 2023-10-13 DIAGNOSIS — M1990 Unspecified osteoarthritis, unspecified site: Secondary | ICD-10-CM | POA: Diagnosis not present

## 2023-10-13 DIAGNOSIS — R2681 Unsteadiness on feet: Secondary | ICD-10-CM | POA: Diagnosis not present

## 2023-10-13 DIAGNOSIS — D62 Acute posthemorrhagic anemia: Secondary | ICD-10-CM | POA: Diagnosis not present

## 2023-10-13 DIAGNOSIS — Z951 Presence of aortocoronary bypass graft: Secondary | ICD-10-CM | POA: Diagnosis not present

## 2023-10-13 DIAGNOSIS — R278 Other lack of coordination: Secondary | ICD-10-CM | POA: Diagnosis not present

## 2023-10-13 DIAGNOSIS — M6281 Muscle weakness (generalized): Secondary | ICD-10-CM | POA: Diagnosis not present

## 2023-10-13 DIAGNOSIS — I10 Essential (primary) hypertension: Secondary | ICD-10-CM | POA: Diagnosis not present

## 2023-10-13 DIAGNOSIS — R55 Syncope and collapse: Secondary | ICD-10-CM | POA: Diagnosis not present

## 2023-10-13 DIAGNOSIS — I447 Left bundle-branch block, unspecified: Secondary | ICD-10-CM | POA: Diagnosis not present

## 2023-10-13 DIAGNOSIS — I480 Paroxysmal atrial fibrillation: Secondary | ICD-10-CM | POA: Diagnosis not present

## 2023-10-13 DIAGNOSIS — E1165 Type 2 diabetes mellitus with hyperglycemia: Secondary | ICD-10-CM | POA: Diagnosis not present

## 2023-10-13 DIAGNOSIS — Z794 Long term (current) use of insulin: Secondary | ICD-10-CM | POA: Diagnosis not present

## 2023-10-13 DIAGNOSIS — D649 Anemia, unspecified: Secondary | ICD-10-CM | POA: Diagnosis not present

## 2023-10-13 DIAGNOSIS — E782 Mixed hyperlipidemia: Secondary | ICD-10-CM | POA: Diagnosis not present

## 2023-10-13 DIAGNOSIS — R0902 Hypoxemia: Secondary | ICD-10-CM | POA: Diagnosis not present

## 2023-10-13 DIAGNOSIS — I110 Hypertensive heart disease with heart failure: Secondary | ICD-10-CM | POA: Diagnosis not present

## 2023-10-13 DIAGNOSIS — R531 Weakness: Secondary | ICD-10-CM | POA: Diagnosis not present

## 2023-10-13 DIAGNOSIS — M25551 Pain in right hip: Secondary | ICD-10-CM | POA: Diagnosis not present

## 2023-10-13 DIAGNOSIS — S72091D Other fracture of head and neck of right femur, subsequent encounter for closed fracture with routine healing: Secondary | ICD-10-CM | POA: Diagnosis not present

## 2023-10-13 DIAGNOSIS — Z7901 Long term (current) use of anticoagulants: Secondary | ICD-10-CM | POA: Diagnosis not present

## 2023-10-13 DIAGNOSIS — E785 Hyperlipidemia, unspecified: Secondary | ICD-10-CM | POA: Diagnosis not present

## 2023-10-13 DIAGNOSIS — Z96652 Presence of left artificial knee joint: Secondary | ICD-10-CM | POA: Diagnosis not present

## 2023-10-13 DIAGNOSIS — I69398 Other sequelae of cerebral infarction: Secondary | ICD-10-CM | POA: Diagnosis not present

## 2023-10-13 DIAGNOSIS — E119 Type 2 diabetes mellitus without complications: Secondary | ICD-10-CM | POA: Diagnosis present

## 2023-10-13 DIAGNOSIS — I700 Atherosclerosis of aorta: Secondary | ICD-10-CM | POA: Diagnosis not present

## 2023-10-13 DIAGNOSIS — E1122 Type 2 diabetes mellitus with diabetic chronic kidney disease: Secondary | ICD-10-CM | POA: Diagnosis not present

## 2023-10-13 DIAGNOSIS — Z885 Allergy status to narcotic agent status: Secondary | ICD-10-CM | POA: Diagnosis not present

## 2023-10-13 DIAGNOSIS — I129 Hypertensive chronic kidney disease with stage 1 through stage 4 chronic kidney disease, or unspecified chronic kidney disease: Secondary | ICD-10-CM | POA: Diagnosis not present

## 2023-10-13 DIAGNOSIS — I4892 Unspecified atrial flutter: Secondary | ICD-10-CM | POA: Diagnosis present

## 2023-10-13 DIAGNOSIS — I4891 Unspecified atrial fibrillation: Secondary | ICD-10-CM | POA: Diagnosis not present

## 2023-10-13 DIAGNOSIS — Z79899 Other long term (current) drug therapy: Secondary | ICD-10-CM | POA: Diagnosis not present

## 2023-10-13 LAB — GLUCOSE, CAPILLARY
Glucose-Capillary: 148 mg/dL — ABNORMAL HIGH (ref 70–99)
Glucose-Capillary: 182 mg/dL — ABNORMAL HIGH (ref 70–99)

## 2023-10-13 MED ORDER — MAGNESIUM HYDROXIDE 400 MG/5ML PO SUSP: 30.0000 mL | Freq: Every day | ORAL | Status: DC | PRN

## 2023-10-13 MED ORDER — POLYETHYLENE GLYCOL 3350 17 G PO PACK
17.0000 g | PACK | Freq: Two times a day (BID) | ORAL | Status: DC
  Administered 2023-10-13: 17 g via ORAL
  Filled 2023-10-13: qty 1

## 2023-10-13 MED ORDER — POLYETHYLENE GLYCOL 3350 17 G PO PACK: 17.0000 g | PACK | Freq: Two times a day (BID) | ORAL | Status: AC

## 2023-10-13 MED ORDER — CLONAZEPAM 0.5 MG PO TABS: 0.5000 mg | ORAL_TABLET | Freq: Every evening | ORAL | 0 refills | Status: DC | PRN

## 2023-10-13 MED ORDER — INSULIN GLARGINE-YFGN 100 UNIT/ML ~~LOC~~ SOLN: 30.0000 [IU] | Freq: Two times a day (BID) | SUBCUTANEOUS | Status: AC

## 2023-10-13 MED ORDER — ADULT MULTIVITAMIN W/MINERALS CH: 1.0000 | ORAL_TABLET | Freq: Every day | ORAL | Status: AC

## 2023-10-13 MED ORDER — SENNA 8.6 MG PO TABS: 1.0000 | ORAL_TABLET | Freq: Two times a day (BID) | ORAL | Status: AC

## 2023-10-13 NOTE — Progress Notes (Signed)
Physical Therapy Treatment Patient Details Name: [name] MRN: [id number] DOB: 27-Oct-1934 Today's Date: 10/13/2023   History of Present Illness Patient is a 89 year old female who presented s/p fall with R hip fx and now s/p THR by anterior approach.  Pt with hx of CAD, CVA (2012), CABG, L THR and L TKR.    PT Comments  General Comments: AxO x 3 "is Hunter off today?" asked pt.  Ple[facility] on phone.  Following all instructions.  Feeling "tired" today. Assisted OOB to [facility] then amb in room.  Limited by fatigue and increased pain.  Positioned in recliner and applied ICE.      If plan is discharge home, recommend the following: A lot of help with walking and/or transfers;A lot of help with bathing/dressing/bathroom;Assistance with cooking/housework;Assist for transportation;Help with stairs or ramp for entrance   Can travel by private vehicle     No  Equipment Recommendations  None recommended by PT    Recommendations for Other Services       Precautions / Restrictions Precautions Precautions: Fall Restrictions Weight Bearing Restrictions: No RLE Weight Bearing: Weight bearing as tolerated     Mobility  Bed Mobility Overal bed mobility: Needs Assistance Bed Mobility: Supine to Sit     Supine to sit: Mod assist, Max assist     General bed mobility comments: Increased time with cues for sequence; physical assist to manage LEs and used bed pad to complete scooting to EOB.    Transfers Overall transfer level: Needs assistance Equipment used: Rolling walker (2 wheels) Transfers: Sit to/from Stand, Bed to chair/wheelchair/BSC Sit to Stand: Min assist, Mod assist           General transfer comment: Cues for LE management and use of UEs to self assist.  Physical assist to bring wt up and fwd and to balance in standing with RW.  Step pvt bed to [facility]    Ambulation/Gait Ambulation/Gait assistance: Min assist Gait Distance (Feet): 18 Feet Assistive device: Rolling  walker (2 wheels) Gait Pattern/deviations: Step-to pattern, Decreased step length - right, Decreased step length - left, Shuffle, Antalgic, Knees buckling Gait velocity: decr     General Gait Details: decreased amb distance due to increased c/o fatigue   Stairs             Wheelchair Mobility     Tilt Bed    Modified Rankin (Stroke Patients Only)       Balance                                            Cognition Arousal: Alert Behavior During Therapy: WFL for tasks assessed/performed Overall Cognitive Status: Within Functional Limits for tasks assessed                                 General Comments: AxO x 3 "is Hunter off today?" asked pt.  Ple[facility] on phone.  Following all instructions.  Feeling "tired" today.        Exercises      General Comments        Pertinent Vitals/Pain Pain Assessment Pain Assessment: 0-10 Pain Score: 7  Pain Location: R hip Pain Descriptors / Indicators: Aching, Sore Pain Intervention(s): Monitored during session, Repositioned, Patient requesting pain meds-RN notified, Ice applied  Home Living                          Prior Function            PT Goals (current goals can now be found in the care plan section) Progress towards PT goals: Progressing toward goals    Frequency    7X/week      PT Plan      Co-evaluation              AM-PAC PT "6 Clicks" Mobility   Outcome Measure  Help needed turning from your back to your side while in a flat bed without using bedrails?: A Lot Help needed moving from lying on your back to sitting on the side of a flat bed without using bedrails?: A Lot Help needed moving to and from a bed to a chair (including a wheelchair)?: A Lot Help needed standing up from a chair using your arms (e.g., wheelchair or bedside chair)?: A Lot Help needed to walk in hospital room?: A Lot Help needed climbing 3-5 steps with a railing? :  Total 6 Click Score: 11    End of Session Equipment Utilized During Treatment: Gait belt Activity Tolerance: Patient tolerated treatment well;Patient limited by fatigue Patient left: with call bell/phone within reach;in chair;with chair alarm set Nurse Communication: Mobility status PT Visit Diagnosis: Difficulty in walking, not elsewhere classified (R26.2)     Time: 2130-8657 PT Time Calculation (min) (ACUTE ONLY): 29 min  Charges:    $Gait Training: 8-22 mins $Therapeutic Activity: 8-22 mins PT General Charges $$ ACUTE PT VISIT: 1 Visit                     Felecia Shelling  PTA Acute  Rehabilitation Services Office M-F          [phone]

## 2023-10-13 NOTE — TOC Transition Note (Signed)
Transition of Care [facility]) - CM/SW Discharge Note  Patient Details  Name: [name] MRN: [id number] Date of Birth: [birth year]  [facility] CM/SW Contact:  Ewing Schlein, LCSW Phone Number: 10/13/2023, 11:59 AM  Clinical Narrative: CSW followed up with [name] at [facility] and confirmed patient can be admitted today. Patient will go to room 112 and the number for report is [phone].  CSW spoke with patient's son, Karrington Mccravy, to notify him of discharge and transportation being set up. Discharge summary, discharge orders, and SNF transfer report faxed to facility in hub. Medical necessity form done; PTAR scheduled. Discharge packet completed. RN updated. TOC signing off.    Final next level of care: Skilled Nursing Facility Barriers to Discharge: Barriers Resolved  Patient Goals and CMS Choice CMS Medicare.gov Compare Post Acute Care list provided to:: Patient Choice offered to / list presented to : Patient, Adult Children  Discharge Placement PASRR number recieved: 10/10/23       Patient chooses bed at: [facility] Patient to be transferred to facility by: PTAR Name of family member notified: Graylyn Bunney (son) Patient and family notified of of transfer: 10/13/23  Discharge Plan and Services Additional resources added to the After Visit Summary for   In-house Referral: Clinical Social Work Post Acute Care Choice: Skilled Nursing Facility          DME Arranged: N/A DME Agency: NA  Social Determinants of Health (SDOH) Interventions SDOH Screenings   Food Insecurity: No Food Insecurity (10/07/2023)  Housing: Low Risk  (10/07/2023)  Transportation Needs: No Transportation Needs (10/07/2023)  Utilities: Not At Risk (10/07/2023)  Tobacco Use: Low Risk  (10/09/2023)   Readmission Risk Interventions     No data to display

## 2023-10-13 NOTE — Plan of Care (Signed)
Patient discharged via PTAR to [facility]. AVS placed in discharge packet. Attempted x 2 to call report to facility but no answer. Instructed PTAR to have facility staff call with any questions. Haydee Salter, RN 10/13/23 1:59 PM

## 2023-10-13 NOTE — Discharge Summary (Signed)
Physician Discharge Summary   Patient: [name] MRN: [id number] DOB: Feb 04, 1934  Admit date:     10/07/2023  Discharge date: 10/13/23  Discharge Physician: Brendia Sacks   PCP: Emilio Aspen, MD   Recommendations at discharge:   Right hip fracture status post mechanical fall Status post surgery 10/10 without apparent complication Continue management as per orthopedics.   DVT prophylaxis with Pradaxa Weight-bear as tolerated Follow-up with orthopedics as an outpatient   Acute hypoxia without respiratory distress Occurring in the context of sleep.  No history of sleep apnea.  Consider outpatient sleep evaluation. Echocardiogram 2023 was reassuring. Based on exam and history, suspect atelectasis is primary driver.  No signs or symptoms to suggest VTE or infection. Continue aggressive incentive spirometry, up to chair and movement throughout the day.   Discharge Diagnoses: Principal Problem:   Closed right hip fracture, initial encounter (HCC) Active Problems:   PAF (paroxysmal atrial fibrillation) (HCC)   Coronary artery disease involving coronary bypass graft of native heart with angina pectoris (HCC)   Essential hypertension, benign   Rheumatoid arthritis (HCC)   DM (diabetes mellitus), type 2 (HCC)   CKD stage 3a, GFR 45-59 ml/min (HCC)   History of stroke  Resolved Problems:   * No resolved hospital problems. *  Hospital Course: 89 year old woman PMH including atrial fibrillation on Pradaxa, CABG, rheumatoid arthritis, presented after a mechanical fall resulting in right hip pain.  Imaging revealed hip fracture.  Underwent right hip arthroplasty 10/10 without apparent complication.  Hospitalization uncomplicated.  Plan for SNF.  Consultants Orthopedics  Cardiology   Procedures 10/10 Right total hip arthroplasty, anterior approach   Right hip fracture status post mechanical fall Status post surgery 10/10 without apparent complication Continue  management as per orthopedics.  Plan for SNF. DVT prophylaxis with Pradaxa Weight-bear as tolerated Follow-up with orthopedics as an outpatient   Acute hypoxia without respiratory distress Occurring in the context of sleep.  No history of sleep apnea.  Consider outpatient sleep evaluation. Echocardiogram 2023 was reassuring. Based on exam and history, suspect atelectasis is primary driver.  No signs or symptoms to suggest VTE or infection. Continue aggressive incentive spirometry, up to chair and movement throughout the day.   Acute renal insufficiency -- resolved CKD stage IIIa No evidence of AKI.  Renal function h now within normal limits.   CAD  PMH CABG in 2006  Had NM PET on 08/26/23 for episodes of chest heaviness and there was a small area of apical lateral ischemia  Seen by cardiology, cleared for surgery Continue Coreg, Imdur, Crestor   Spironolactone held secondary to hyperkalemia.  Can resume on discharge.   Atrial fibrillation  Continue carvedilol, full dose Pradaxa.     Insulin-dependent DM  CBG remains stable.  Continue home regimen on discharge.   Chronic diastolic CHF   Appears compensated.   Continue carvedilol.  Resume Aldactone on discharge.      Pain control - [employer] Controlled Substance Reporting System database was reviewed.    Disposition: Skilled nursing facility Diet recommendation:  Carb-modified   DISCHARGE MEDICATION: Allergies as of 10/13/2023       Reactions   Codeine Nausea And Vomiting   Penicillins Swelling, Other (See Comments)   Swelling of the tongue   Acetaminophen-codeine Other (See Comments)   Upset the stomach   Atenolol Other (See Comments)   Reaction unconfirmed- might have just be an ineffective med   Atorvastatin Other (See Comments)   Muscle soreness  Chloramphenicol Other (See Comments)   Reaction unconfirmed   Dapagliflozin Other (See Comments)    yeast infections   Empagliflozin Other (See Comments)    Yeast infections   Liraglutide Nausea Only, Other (See Comments)   Constipation, bloating also   Metformin Hcl Nausea Only   Metoprolol Other (See Comments)   Reaction unconfirmed   Zetia [ezetimibe] Other (See Comments)   Reaction unconfirmed- might have just be an ineffective med   Sertraline Palpitations        Medication List     STOP taking these medications    Insulin Lispro Prot & Lispro (50-50) 100 UNIT/ML Kwikpen Commonly known as: HUMALOG 50/50 MIX       TAKE these medications    Align 4 MG Caps Take 4 mg by mouth daily in the afternoon.   carvedilol 6.25 MG tablet Commonly known as: COREG Take 1 tablet (6.25 mg total) by mouth 2 (two) times daily. What changed: when to take this   Claritin Reditabs 10 MG dissolvable tablet Generic drug: loratadine Take 10 mg by mouth in the morning.   clobetasol cream 0.05 % Commonly known as: TEMOVATE Apply 1 application topically 2 (two) times daily as needed (skin irritation).   clonazePAM 0.5 MG tablet Commonly known as: KLONOPIN Take 1 tablet (0.5 mg total) by mouth at bedtime as needed for anxiety. What changed:  how much to take when to take this reasons to take this additional instructions   dabigatran 150 MG Caps capsule Commonly known as: PRADAXA Take 150 mg by mouth 2 (two) times daily.   docusate sodium 100 MG capsule Commonly known as: COLACE Take 100 mg by mouth at bedtime as needed for mild constipation.   Enbrel 50 MG/ML injection Generic drug: etanercept Inject 50 mg into the skin every Tuesday.   FreeStyle Libre 2 Sensor Misc Inject 1 Device into the skin every 14 (fourteen) days.   insulin glargine-yfgn 100 UNIT/ML injection Commonly known as: SEMGLEE Inject 0.3 mLs (30 Units total) into the skin 2 (two) times daily.   isosorbide mononitrate 30 MG 24 hr tablet Commonly known as: IMDUR Take 0.5 tablets (15 mg total) by mouth daily.   multivitamin with minerals Tabs tablet Take 1  tablet by mouth daily. Start taking on: October 14, 2023   nitroGLYCERIN 0.4 MG SL tablet Commonly known as: NITROSTAT Place 1 tablet (0.4 mg total) under the tongue every 5 (five) minutes x 3 doses as needed for chest pain.   oxyCODONE 5 MG immediate release tablet Commonly known as: Roxicodone Take 1 tablet (5 mg total) by mouth every 4 (four) hours as needed for severe pain.   polyethylene glycol 17 g packet Commonly known as: MIRALAX / GLYCOLAX Take 17 g by mouth 2 (two) times daily. What changed:  when to take this reasons to take this   ramipril 5 MG capsule Commonly known as: ALTACE TAKE ONE CAPSULE BY MOUTH TWICE DAILY   rosuvastatin 10 MG tablet Commonly known as: CRESTOR Take 10 mg by mouth at bedtime.   senna 8.6 MG Tabs tablet Commonly known as: SENOKOT Take 1 tablet (8.6 mg total) by mouth 2 (two) times daily.   spironolactone 25 MG tablet Commonly known as: ALDACTONE Take 0.5 tablets (12.5 mg total) by mouth daily.   Tums Ultra 1000 1000 MG chewable tablet Generic drug: calcium elemental as carbonate Chew 1,000 mg by mouth 3 (three) times daily as needed for heartburn.   Tylenol 8 Hour Arthritis Pain 650  MG CR tablet Generic drug: acetaminophen Take 650 mg by mouth in the morning.   Vitamin B-12 2500 MCG Subl Place 2,500 mcg under the tongue every Monday, Wednesday, and Friday.   Vitamin D3 1000 units Caps Take 1,000 Units by mouth daily.               Discharge Care Instructions  (From admission, onward)           Start     Ordered   10/13/23 0000  Discharge wound care:       Comments: Keep surgery site clean and dry.   10/13/23 1157            Contact information for follow-up providers     [place], Alain Honey, PA-C. Schedule an appointment as soon as possible for a visit in 2 week(s).   Specialty: Orthopedic Surgery Why: For suture removal, For wound re-check Contact information: [address], [address] [place]  [address] [phone]              Contact information for after-discharge care     Destination     [facility] Preferred SNF .   Service: Skilled Nursing Contact information: [address] [place] [address] [phone]                    Feels ok today Tolerating diet but appetite is poor  Discharge Exam: Filed Weights   10/07/23 1904 10/09/23 1245  Weight: 88.5 kg 88.5 kg   Physical Exam Vitals reviewed.  Constitutional:      General: She is not in acute distress.    Appearance: She is not ill-appearing or toxic-appearing.  Cardiovascular:     Rate and Rhythm: Normal rate and regular rhythm.     Heart sounds: No murmur heard. Pulmonary:     Effort: Pulmonary effort is normal. No respiratory distress.     Breath sounds: No wheezing, rhonchi or rales.  Musculoskeletal:     Right lower leg: No edema.     Left lower leg: No edema.  Neurological:     Mental Status: She is alert.  Psychiatric:        Mood and Affect: Mood normal.        Behavior: Behavior normal.    No new data  Condition at discharge: good  The results of significant diagnostics from this hospitalization (including imaging, microbiology, ancillary and laboratory) are listed below for reference.   Imaging Studies: DG HIP UNILAT WITH PELVIS 1V RIGHT  Result Date: 10/09/2023 CLINICAL DATA:  Elective surgery. EXAM: DG HIP (WITH OR WITHOUT PELVIS) 1V RIGHT COMPARISON:  Preoperative imaging. FINDINGS: Five fluoroscopic spot views of the pelvis and left hip obtained in the operating room. Sequential images during hip arthroplasty. Fluoroscopy time 13.6 seconds. Dose 3.2898 mGy. IMPRESSION: Intraoperative fluoroscopy for right hip arthroplasty. Electronically Signed   By: Narda Rutherford M.D.   On: 10/09/2023 20:26   DG Pelvis Portable  Result Date: 10/09/2023 CLINICAL DATA:  Postop right hip replacement EXAM: PORTABLE PELVIS 1-2 VIEWS COMPARISON:   10/07/2023 FINDINGS: SI joints are non widened. Pubic symphysis and rami appear intact. Stable left hip replacement. Interval right hip replacement with normal alignment. Gas in the soft tissues consistent with recent surgery IMPRESSION: Status post right hip replacement. Electronically Signed   By: Jasmine Pang M.D.   On: 10/09/2023 20:25   DG C-Arm 1-60 Min-No Report  Result Date: 10/09/2023 Fluoroscopy was utilized by the  requesting physician.  No radiographic interpretation.   DG C-Arm 1-60 Min-No Report  Result Date: 10/09/2023 Fluoroscopy was utilized by the requesting physician.  No radiographic interpretation.   DG Knee Right Port  Result Date: 10/08/2023 CLINICAL DATA:  Closed displaced fracture right femoral neck. EXAM: PORTABLE RIGHT KNEE - 1-2 VIEW COMPARISON:  Right knee radiographs 07/08/2007 FINDINGS: Diffuse decreased bone mineralization. Severe medial compartment joint space narrowing and peripheral osteophytosis. Mild peripheral lateral compartment degenerative osteophytosis. Severe patellofemoral joint space narrowing and peripheral osteophytosis. No joint effusion. No acute fracture or dislocation. Surgical clips again overlie the posteromedial knee. No acute fracture is seen. No dislocation. IMPRESSION: Severe medial and patellofemoral compartment osteoarthritis. Electronically Signed   By: Neita Garnet M.D.   On: 10/08/2023 08:42   CT HEAD WO CONTRAST ( )  Result Date: 10/07/2023 CLINICAL DATA:  Mental status change, unknown cause dizziness, fall; Neck trauma (Age >= 65y) EXAM: CT HEAD WITHOUT CONTRAST CT CERVICAL SPINE WITHOUT CONTRAST TECHNIQUE: Multidetector CT imaging of the head and cervical spine was performed following the standard protocol without intravenous contrast. Multiplanar CT image reconstructions of the cervical spine were also generated. RADIATION DOSE REDUCTION: This exam was performed according to the departmental dose-optimization program which  includes automated exposure control, adjustment of the mA and/or kV according to patient size and/or use of iterative reconstruction technique. COMPARISON:  None Available. FINDINGS: CT HEAD FINDINGS Brain: Normal anatomic configuration. Parenchymal volume loss is commensurate with the patient's age. Stable mild periventricular white matter changes are present likely reflecting the sequela of small vessel ischemia. Remote left cerebellar and right thalamic infarcts again noted. No abnormal intra or extra-axial mass lesion or fluid collection. No abnormal mass effect or midline shift. No evidence of acute intracranial hemorrhage or infarct. Ventricular size is normal. Cerebellum unremarkable. Vascular: No asymmetric hyperdense vasculature at the skull base. Skull: Intact Sinuses/Orbits: Paranasal sinuses are clear. Orbits are unremarkable. Other: Mastoid air cells and middle ear cavities are clear. CT CERVICAL SPINE FINDINGS Alignment: Normal. Skull base and vertebrae: Craniocervical alignment is normal. The atlantodental interval is not widened. No acute fracture of the cervical spine. Vertebral body height is preserved. Ankylosis of the facet joints of C3-4 noted bilaterally. Soft tissues and spinal canal: No prevertebral fluid or swelling. No visible canal hematoma. Disc levels: Intervertebral disc space narrowing and endplate remodeling at C5-C7 is present in keeping with changes of moderate degenerative disc disease. Prevertebral soft tissues are not thickened on sagittal reformats. Spinal canal is widely patent. Uncovertebral arthrosis results in moderate to severe right neuroforaminal narrowing at C5-6 and C6-7. Upper chest: Negative. Other: None IMPRESSION: 1. No acute intracranial abnormality. No calvarial fracture. 2. Stable senescent change and remote left cerebellar and right thalamic infarcts. 3. No acute fracture or listhesis of the cervical spine. 4. Multilevel degenerative disc and degenerative joint  disease resulting in moderate to severe right neuroforaminal narrowing at C5-6 and C6-7. Electronically Signed   By: Helyn Numbers M.D.   On: 10/07/2023 20:09   CT Cervical Spine Wo Contrast  Result Date: 10/07/2023 CLINICAL DATA:  Mental status change, unknown cause dizziness, fall; Neck trauma (Age >= 65y) EXAM: CT HEAD WITHOUT CONTRAST CT CERVICAL SPINE WITHOUT CONTRAST TECHNIQUE: Multidetector CT imaging of the head and cervical spine was performed following the standard protocol without intravenous contrast. Multiplanar CT image reconstructions of the cervical spine were also generated. RADIATION DOSE REDUCTION: This exam was performed according to the departmental dose-optimization program which includes automated exposure control, adjustment of  the mA and/or kV according to patient size and/or use of iterative reconstruction technique. COMPARISON:  None Available. FINDINGS: CT HEAD FINDINGS Brain: Normal anatomic configuration. Parenchymal volume loss is commensurate with the patient's age. Stable mild periventricular white matter changes are present likely reflecting the sequela of small vessel ischemia. Remote left cerebellar and right thalamic infarcts again noted. No abnormal intra or extra-axial mass lesion or fluid collection. No abnormal mass effect or midline shift. No evidence of acute intracranial hemorrhage or infarct. Ventricular size is normal. Cerebellum unremarkable. Vascular: No asymmetric hyperdense vasculature at the skull base. Skull: Intact Sinuses/Orbits: Paranasal sinuses are clear. Orbits are unremarkable. Other: Mastoid air cells and middle ear cavities are clear. CT CERVICAL SPINE FINDINGS Alignment: Normal. Skull base and vertebrae: Craniocervical alignment is normal. The atlantodental interval is not widened. No acute fracture of the cervical spine. Vertebral body height is preserved. Ankylosis of the facet joints of C3-4 noted bilaterally. Soft tissues and spinal canal: No  prevertebral fluid or swelling. No visible canal hematoma. Disc levels: Intervertebral disc space narrowing and endplate remodeling at C5-C7 is present in keeping with changes of moderate degenerative disc disease. Prevertebral soft tissues are not thickened on sagittal reformats. Spinal canal is widely patent. Uncovertebral arthrosis results in moderate to severe right neuroforaminal narrowing at C5-6 and C6-7. Upper chest: Negative. Other: None IMPRESSION: 1. No acute intracranial abnormality. No calvarial fracture. 2. Stable senescent change and remote left cerebellar and right thalamic infarcts. 3. No acute fracture or listhesis of the cervical spine. 4. Multilevel degenerative disc and degenerative joint disease resulting in moderate to severe right neuroforaminal narrowing at C5-6 and C6-7. Electronically Signed   By: Helyn Numbers M.D.   On: 10/07/2023 20:09   DG Chest 1 View  Result Date: 10/07/2023 CLINICAL DATA:  Hip pain with fall EXAM: CHEST  1 VIEW COMPARISON:  04/17/2019 FINDINGS: Post sternotomy changes. Low lung volumes. No acute airspace disease or pleural effusion. Aortic atherosclerosis. No pneumothorax. IMPRESSION: Low lung volumes. Electronically Signed   By: Jasmine Pang M.D.   On: 10/07/2023 19:51   DG Hip Unilat W or Wo Pelvis 2-3 Views Right  Result Date: 10/07/2023 CLINICAL DATA:  Right hip pain after a fall. EXAM: DG HIP (WITH OR WITHOUT PELVIS) 2-3V RIGHT COMPARISON:  None Available. FINDINGS: Suspicion of subcapital femoral neck fracture on the right. Limited detail because of patient's size. Consider CT for confirmation. No other pelvic fracture. Previous hip replacement on the left. IMPRESSION: Suspicion of subcapital femoral neck fracture on the right. Consider CT for confirmation. Electronically Signed   By: Paulina Fusi M.D.   On: 10/07/2023 19:50    Microbiology: Results for orders placed or performed during the hospital encounter of 10/07/23  Surgical PCR screen      Status: None   Collection Time: 10/08/23  6:17 PM   Specimen: Nasal Mucosa; Nasal Swab  Result Value Ref Range Status   MRSA, PCR NEGATIVE NEGATIVE Final   Staphylococcus aureus NEGATIVE NEGATIVE Final    Comment: (NOTE) The Xpert SA Assay (FDA approved for NASAL specimens in patients 41 years of age and older), is one component of a comprehensive surveillance program. It is not intended to diagnose infection nor to guide or monitor treatment. Performed at [facility], [address], [place], [place] [address]     Labs: CBC: Recent Labs  Lab 10/07/23 1917 10/08/23 0356 10/09/23 0355 10/10/23 0329 10/11/23 0327 10/12/23 0338  WBC 6.7 9.3 8.2 8.7 10.3  8.7  NEUTROABS 3.2  --   --   --   --   --   HGB 12.7 12.1 11.1* 10.7* 10.4* 9.9*  HCT 39.1 39.6 35.1* 33.2* 32.4* 31.2*  MCV 93.3 99.7 95.1 93.5 95.0 94.3  PLT 156 PLATELET CLUMPS NOTED ON SMEAR, UNABLE TO ESTIMATE 129* 125* 139* 155   Basic Metabolic Panel: Recent Labs  Lab 10/08/23 0356 10/09/23 0355 10/10/23 0329 10/11/23 0327 10/12/23 0338  NA 139 134* 131* 135 134*  K 5.3* 4.4 4.9 4.4 4.6  CL 109 104 102 104 103  CO2 21* 23 20* 21* 24  GLUCOSE 192* 124* 220* 173* 160*  BUN 29* 27* 33* 47* 33*  CREATININE 0.95 1.05* 1.24* 1.05* 0.90  CALCIUM 9.2 9.1 8.3* 8.9 8.7*   Liver Function Tests: No results for input(s): "AST", "ALT", "ALKPHOS", "BILITOT", "PROT", "ALBUMIN" in the last 168 hours. CBG: Recent Labs  Lab 10/12/23 1149 10/12/23 1626 10/12/23 2143 10/13/23 0738 10/13/23 1150  GLUCAP 192* 168* 140* 148* 182*    Discharge time spent: less than 30 minutes.  Signed: Brendia Sacks, MD [facility] 10/13/2023

## 2023-10-13 NOTE — Plan of Care (Signed)
Patient progressing well overall. Has not had BM since 10/8. MD notified and new orders obtained.  Problem: Education: Goal: Ability to describe self-care measures that may prevent or decrease complications (Diabetes Survival Skills Education) will improve Outcome: Progressing   Problem: Coping: Goal: Ability to adjust to condition or change in health will improve Outcome: Progressing   Problem: Metabolic: Goal: Ability to maintain appropriate glucose levels will improve Outcome: Progressing   Problem: Nutritional: Goal: Maintenance of adequate nutrition will improve Outcome: Progressing   Problem: Pain Managment: Goal: General experience of comfort will improve Outcome: Progressing   Problem: Activity: Goal: Ability to avoid complications of mobility impairment will improve Outcome: Progressing   Haydee Salter, RN 10/13/23 9:24 AM

## 2023-10-15 DIAGNOSIS — I2510 Atherosclerotic heart disease of native coronary artery without angina pectoris: Secondary | ICD-10-CM | POA: Diagnosis not present

## 2023-10-16 ENCOUNTER — Emergency Department (HOSPITAL_COMMUNITY): Payer: Medicare Other

## 2023-10-16 ENCOUNTER — Inpatient Hospital Stay (HOSPITAL_COMMUNITY)
Admission: EM | Admit: 2023-10-16 | Discharge: 2023-10-20 | DRG: 560 | Disposition: A | Discharge status: Skilled Nursing Facility | Payer: Medicare Other | Source: Skilled Nursing Facility | Attending: Obstetrics and Gynecology | Admitting: Obstetrics and Gynecology

## 2023-10-16 DIAGNOSIS — Z8249 Family history of ischemic heart disease and other diseases of the circulatory system: Secondary | ICD-10-CM | POA: Diagnosis not present

## 2023-10-16 DIAGNOSIS — I4892 Unspecified atrial flutter: Secondary | ICD-10-CM | POA: Diagnosis present

## 2023-10-16 DIAGNOSIS — M6281 Muscle weakness (generalized): Secondary | ICD-10-CM | POA: Diagnosis not present

## 2023-10-16 DIAGNOSIS — Z7401 Bed confinement status: Secondary | ICD-10-CM | POA: Diagnosis not present

## 2023-10-16 DIAGNOSIS — Z885 Allergy status to narcotic agent status: Secondary | ICD-10-CM

## 2023-10-16 DIAGNOSIS — L4050 Arthropathic psoriasis, unspecified: Secondary | ICD-10-CM | POA: Diagnosis not present

## 2023-10-16 DIAGNOSIS — Z9181 History of falling: Secondary | ICD-10-CM | POA: Diagnosis not present

## 2023-10-16 DIAGNOSIS — R0902 Hypoxemia: Secondary | ICD-10-CM | POA: Diagnosis not present

## 2023-10-16 DIAGNOSIS — M1990 Unspecified osteoarthritis, unspecified site: Secondary | ICD-10-CM | POA: Diagnosis not present

## 2023-10-16 DIAGNOSIS — I447 Left bundle-branch block, unspecified: Secondary | ICD-10-CM | POA: Diagnosis present

## 2023-10-16 DIAGNOSIS — R41841 Cognitive communication deficit: Secondary | ICD-10-CM | POA: Diagnosis not present

## 2023-10-16 DIAGNOSIS — I959 Hypotension, unspecified: Secondary | ICD-10-CM | POA: Diagnosis not present

## 2023-10-16 DIAGNOSIS — Z794 Long term (current) use of insulin: Secondary | ICD-10-CM | POA: Diagnosis not present

## 2023-10-16 DIAGNOSIS — I129 Hypertensive chronic kidney disease with stage 1 through stage 4 chronic kidney disease, or unspecified chronic kidney disease: Secondary | ICD-10-CM | POA: Diagnosis not present

## 2023-10-16 DIAGNOSIS — I2510 Atherosclerotic heart disease of native coronary artery without angina pectoris: Secondary | ICD-10-CM | POA: Diagnosis present

## 2023-10-16 DIAGNOSIS — I4891 Unspecified atrial fibrillation: Secondary | ICD-10-CM | POA: Diagnosis not present

## 2023-10-16 DIAGNOSIS — I69398 Other sequelae of cerebral infarction: Secondary | ICD-10-CM | POA: Diagnosis not present

## 2023-10-16 DIAGNOSIS — Z951 Presence of aortocoronary bypass graft: Secondary | ICD-10-CM | POA: Diagnosis not present

## 2023-10-16 DIAGNOSIS — N1831 Chronic kidney disease, stage 3a: Secondary | ICD-10-CM | POA: Diagnosis not present

## 2023-10-16 DIAGNOSIS — E785 Hyperlipidemia, unspecified: Secondary | ICD-10-CM | POA: Diagnosis present

## 2023-10-16 DIAGNOSIS — I480 Paroxysmal atrial fibrillation: Secondary | ICD-10-CM | POA: Diagnosis not present

## 2023-10-16 DIAGNOSIS — I110 Hypertensive heart disease with heart failure: Secondary | ICD-10-CM | POA: Diagnosis present

## 2023-10-16 DIAGNOSIS — R1312 Dysphagia, oropharyngeal phase: Secondary | ICD-10-CM | POA: Diagnosis not present

## 2023-10-16 DIAGNOSIS — R2689 Other abnormalities of gait and mobility: Secondary | ICD-10-CM | POA: Diagnosis not present

## 2023-10-16 DIAGNOSIS — Z96652 Presence of left artificial knee joint: Secondary | ICD-10-CM | POA: Diagnosis present

## 2023-10-16 DIAGNOSIS — I10 Essential (primary) hypertension: Secondary | ICD-10-CM | POA: Diagnosis present

## 2023-10-16 DIAGNOSIS — E1165 Type 2 diabetes mellitus with hyperglycemia: Secondary | ICD-10-CM | POA: Diagnosis not present

## 2023-10-16 DIAGNOSIS — S72091D Other fracture of head and neck of right femur, subsequent encounter for closed fracture with routine healing: Secondary | ICD-10-CM | POA: Diagnosis not present

## 2023-10-16 DIAGNOSIS — M069 Rheumatoid arthritis, unspecified: Secondary | ICD-10-CM | POA: Diagnosis present

## 2023-10-16 DIAGNOSIS — I5032 Chronic diastolic (congestive) heart failure: Secondary | ICD-10-CM | POA: Diagnosis not present

## 2023-10-16 DIAGNOSIS — E7800 Pure hypercholesterolemia, unspecified: Secondary | ICD-10-CM | POA: Diagnosis not present

## 2023-10-16 DIAGNOSIS — I700 Atherosclerosis of aorta: Secondary | ICD-10-CM | POA: Diagnosis not present

## 2023-10-16 DIAGNOSIS — D62 Acute posthemorrhagic anemia: Secondary | ICD-10-CM | POA: Diagnosis present

## 2023-10-16 DIAGNOSIS — Z7902 Long term (current) use of antithrombotics/antiplatelets: Secondary | ICD-10-CM

## 2023-10-16 DIAGNOSIS — M25551 Pain in right hip: Secondary | ICD-10-CM | POA: Diagnosis not present

## 2023-10-16 DIAGNOSIS — S72001D Fracture of unspecified part of neck of right femur, subsequent encounter for closed fracture with routine healing: Secondary | ICD-10-CM | POA: Diagnosis not present

## 2023-10-16 DIAGNOSIS — S72144D Nondisplaced intertrochanteric fracture of right femur, subsequent encounter for closed fracture with routine healing: Secondary | ICD-10-CM | POA: Diagnosis not present

## 2023-10-16 DIAGNOSIS — E119 Type 2 diabetes mellitus without complications: Secondary | ICD-10-CM | POA: Diagnosis not present

## 2023-10-16 DIAGNOSIS — Z6837 Body mass index (BMI) 37.0-37.9, adult: Secondary | ICD-10-CM

## 2023-10-16 DIAGNOSIS — M9701XA Periprosthetic fracture around internal prosthetic right hip joint, initial encounter: Secondary | ICD-10-CM | POA: Diagnosis not present

## 2023-10-16 DIAGNOSIS — Z888 Allergy status to other drugs, medicaments and biological substances status: Secondary | ICD-10-CM

## 2023-10-16 DIAGNOSIS — R42 Dizziness and giddiness: Secondary | ICD-10-CM | POA: Diagnosis not present

## 2023-10-16 DIAGNOSIS — R262 Difficulty in walking, not elsewhere classified: Secondary | ICD-10-CM | POA: Diagnosis not present

## 2023-10-16 DIAGNOSIS — E1122 Type 2 diabetes mellitus with diabetic chronic kidney disease: Secondary | ICD-10-CM | POA: Diagnosis not present

## 2023-10-16 DIAGNOSIS — Z96641 Presence of right artificial hip joint: Secondary | ICD-10-CM | POA: Diagnosis not present

## 2023-10-16 DIAGNOSIS — R2681 Unsteadiness on feet: Secondary | ICD-10-CM | POA: Diagnosis not present

## 2023-10-16 DIAGNOSIS — R55 Syncope and collapse: Secondary | ICD-10-CM | POA: Diagnosis not present

## 2023-10-16 DIAGNOSIS — Z880 Allergy status to penicillin: Secondary | ICD-10-CM

## 2023-10-16 DIAGNOSIS — M810 Age-related osteoporosis without current pathological fracture: Secondary | ICD-10-CM | POA: Diagnosis present

## 2023-10-16 DIAGNOSIS — I69391 Dysphagia following cerebral infarction: Secondary | ICD-10-CM | POA: Diagnosis not present

## 2023-10-16 DIAGNOSIS — D649 Anemia, unspecified: Secondary | ICD-10-CM | POA: Diagnosis present

## 2023-10-16 DIAGNOSIS — Z96642 Presence of left artificial hip joint: Secondary | ICD-10-CM | POA: Diagnosis present

## 2023-10-16 DIAGNOSIS — E782 Mixed hyperlipidemia: Secondary | ICD-10-CM

## 2023-10-16 DIAGNOSIS — Z7901 Long term (current) use of anticoagulants: Secondary | ICD-10-CM

## 2023-10-16 DIAGNOSIS — Z8673 Personal history of transient ischemic attack (TIA), and cerebral infarction without residual deficits: Secondary | ICD-10-CM

## 2023-10-16 DIAGNOSIS — Z79899 Other long term (current) drug therapy: Secondary | ICD-10-CM

## 2023-10-16 DIAGNOSIS — I482 Chronic atrial fibrillation, unspecified: Secondary | ICD-10-CM | POA: Diagnosis not present

## 2023-10-16 DIAGNOSIS — R278 Other lack of coordination: Secondary | ICD-10-CM | POA: Diagnosis not present

## 2023-10-16 DIAGNOSIS — Z741 Need for assistance with personal care: Secondary | ICD-10-CM | POA: Diagnosis not present

## 2023-10-16 LAB — COMPREHENSIVE METABOLIC PANEL
ALT: 23 U/L (ref 0–44)
AST: 29 U/L (ref 15–41)
Albumin: 2.5 g/dL — ABNORMAL LOW (ref 3.5–5.0)
Alkaline Phosphatase: 77 U/L (ref 38–126)
Anion gap: 7 (ref 5–15)
BUN: 20 mg/dL (ref 8–23)
CO2: 23 mmol/L (ref 22–32)
Calcium: 8.3 mg/dL — ABNORMAL LOW (ref 8.9–10.3)
Chloride: 108 mmol/L (ref 98–111)
Creatinine, Ser: 0.99 mg/dL (ref 0.44–1.00)
GFR, Estimated: 55 mL/min — ABNORMAL LOW (ref >60)
Glucose, Bld: 136 mg/dL — ABNORMAL HIGH (ref 70–99)
Potassium: 4.5 mmol/L (ref 3.5–5.1)
Sodium: 138 mmol/L (ref 135–145)
Total Bilirubin: 0.9 mg/dL (ref 0.3–1.2)
Total Protein: 5.2 g/dL — ABNORMAL LOW (ref 6.5–8.1)

## 2023-10-16 LAB — CBC WITH DIFFERENTIAL/PLATELET
Abs Immature Granulocytes: 0.21 10*3/uL — ABNORMAL HIGH (ref 0.00–0.07)
Basophils Absolute: 0 10*3/uL (ref 0.0–0.1)
Basophils Relative: 0 %
Eosinophils Absolute: 0.4 10*3/uL (ref 0.0–0.5)
Eosinophils Relative: 4 %
HCT: 29.4 % — ABNORMAL LOW (ref 36.0–46.0)
Hemoglobin: 9.6 g/dL — ABNORMAL LOW (ref 12.0–15.0)
Immature Granulocytes: 2 %
Lymphocytes Relative: 22 %
Lymphs Abs: 2.1 10*3/uL (ref 0.7–4.0)
MCH: 30.6 pg (ref 26.0–34.0)
MCHC: 32.7 g/dL (ref 30.0–36.0)
MCV: 93.6 fL (ref 80.0–100.0)
Monocytes Absolute: 0.9 10*3/uL (ref 0.1–1.0)
Monocytes Relative: 10 %
Neutro Abs: 5.7 10*3/uL (ref 1.7–7.7)
Neutrophils Relative %: 62 %
Platelets: 193 10*3/uL (ref 150–400)
RBC: 3.14 MIL/uL — ABNORMAL LOW (ref 3.87–5.11)
RDW: 12.5 % (ref 11.5–15.5)
WBC: 9.3 10*3/uL (ref 4.0–10.5)
nRBC: 0 % (ref 0.0–0.2)

## 2023-10-16 LAB — TROPONIN I (HIGH SENSITIVITY)
Troponin I (High Sensitivity): 9 ng/L (ref <18)
Troponin I (High Sensitivity): 12 ng/L (ref <18)

## 2023-10-16 MED ORDER — NALOXONE HCL 0.4 MG/ML IJ SOLN: 0.4000 mg | INTRAMUSCULAR | Status: DC | PRN

## 2023-10-16 MED ORDER — MELATONIN 3 MG PO TABS
3.0000 mg | ORAL_TABLET | Freq: Every evening | ORAL | Status: DC | PRN
  Administered 2023-10-19: 3 mg via ORAL
  Filled 2023-10-16 (×2): qty 1

## 2023-10-16 MED ORDER — TRAMADOL HCL 50 MG PO TABS
50.0000 mg | ORAL_TABLET | Freq: Once | ORAL | Status: AC
  Administered 2023-10-16: 50 mg via ORAL
  Filled 2023-10-16: qty 1

## 2023-10-16 MED ORDER — ACETAMINOPHEN 650 MG RE SUPP: 650.0000 mg | Freq: Four times a day (QID) | RECTAL | Status: DC | PRN

## 2023-10-16 MED ORDER — ONDANSETRON HCL 4 MG/2ML IJ SOLN
4.0000 mg | Freq: Four times a day (QID) | INTRAMUSCULAR | Status: DC | PRN
  Administered 2023-10-18: 4 mg via INTRAVENOUS
  Filled 2023-10-16: qty 2

## 2023-10-16 MED ORDER — ACETAMINOPHEN 325 MG PO TABS
650.0000 mg | ORAL_TABLET | Freq: Four times a day (QID) | ORAL | Status: DC | PRN
  Administered 2023-10-17: 650 mg via ORAL
  Filled 2023-10-16: qty 2

## 2023-10-16 MED ORDER — ACETAMINOPHEN 500 MG PO TABS
1000.0000 mg | ORAL_TABLET | Freq: Once | ORAL | Status: AC
  Administered 2023-10-16: 1000 mg via ORAL
  Filled 2023-10-16: qty 2

## 2023-10-16 MED ORDER — FENTANYL CITRATE PF 50 MCG/ML IJ SOSY: 25.0000 ug | PREFILLED_SYRINGE | INTRAMUSCULAR | Status: DC | PRN

## 2023-10-16 NOTE — ED Notes (Signed)
Pt was unable to bear weight or ambulate with assistance/walker

## 2023-10-16 NOTE — H&P (Signed)
History and Physical      ANNESOPHIE SURI YQM:[id number] DOB: Oct 24, 1934 DOA: 10/16/2023; DOS: 10/16/2023  PCP: Emilio Aspen, MD  Patient coming from: home   I have personally briefly reviewed patient's old medical records in [facility]  Chief Complaint: Right hip pain  HPI: [name] is a 89 y.o. female with medical history significant for paroxysmal atrial flutter chronically anticoagulated on Pradaxa, type 2 diabetes mellitus, essential pretension, hyperlipidemia, psoriatic arthritis, chronic diastolic heart failure, who is admitted to [facility] on 10/16/2023 with periprosthetic right hip fracture after presenting from home to [facility] ED complaining of right hip pain.   The patient was recently hospitalized from 10/07/2023 to 10/13/2023  during which time she underwent right total hip arthroplasty with [facility] in 10/09/2023.  Over the course of the next 9 days, she reports that she experienced no ensuing bowel movement until today.  While in the process of having a large bowel movement on the commode, she notes that she developed dizziness, lightheadedness, and subsequently experienced loss of consciousness.  This reportedly was witnessed, and those present at that time were able to assist the patient to the bathroom floor, without any associated fall, trauma, or hitting the head.  Patient remained unconscious only for a few seconds, and upon awakening, was at baseline mental status.  Patient denies any associated, or ensuing chest pain or shortness of breath.  No ensuing additional episodes of loss of consciousness.  However, after awakening from her loss of consciousness, patient reports significant exacerbation in the intensity of the discomfort associate with her right hip, in spite of no fall or injury to the right hip as a component of the syncopal episode.  She has a history of paroxysmal atrial flutter for which she is chronically anticoagulated on Pradaxa.   Otherwise, on no additional blood thinners as an outpatient.  No recent shortness of breath, orthopnea, PND, or worsening of peripheral edema.  Per chart review, prior hemoglobin data points notable for the following: 12.7 on 10/07/2023 followed by 12.1 on 10/08/2023, 11.1 on 10/09/2023, 10.4 on 10/11/2023, 9.9 on 10/12/2023.  Denies any recent melena or hematochezia.  No recent hematemesis or any acute hematuria.    ED Course:  Vital signs in the ED were notable for the following: Afebrile; heart rates in the 60s 70s; systolic blood pressures in the low 100s to 140s; respiratory rate 14-18, oxygen saturation 97 to 100% on room air.  Labs were notable for the following: CMP notable for creatinine 0.99 compared to 0.90 on 10/12/2023, glucose 136, calcium, adjusted for mild hypoalbuminemia noted to be 9.5, avidin 2.5, otherwise, liver enzymes within normal limits.  High sensitive troponin I initially 9, 3 value trending up slightly to 12.  CBC notable for will with cell count 9300, hemoglobin 9.6 associated Neuraceq/marker properties as well as nonelevated RDW, platelet count 193.  Per my interpretation, EKG in ED demonstrated the following: Sinus rhythm with heart rate 59, left bundle branch block, no evidence of T wave or ST changes, Cleen evidence of ST elevation.  Imaging in the ED, per corresponding formal radiology read, was notable for the following: 1 view chest x-ray showed no evidence of acute cardiopulmonary process, Cleen evidence of infiltrate, edema, effusion, or pneumothorax.  CT right hip showed nondisplaced periprosthetic fracture adjacent to the right hip replacement Component.  EDP has discussed patient's case with on-call [facility] (Dr. Rennis Chris) who recommends [facility] admission and conveys that [facility] will formally consult and see  the patient in the morning.  Dr. Rennis Chris suspects that this periprosthetic right hip fracture will be nonsurgical in nature, but will formally evaluate  in the AM.    While in the ED, the following were administered: Acetaminophen 1 g p.o. x 1, tramadol 50 mg p.o. x 1 dose.  Subsequently, the patient was admitted for further evaluation management of acute periprosthetic right hip fracture, with presentation also notable for preceding episode of syncope, and presenting labs notable for subacute postoperative anemia.     Review of Systems: As per HPI otherwise 10 point review of systems negative.   Past Medical History:  Diagnosis Date   Anginal pain (HCC)    "saw doctor-think it was esophagus spasm"   Arthritis    Atrial flutter (HCC) 11/02/2013   Cataract    left eye   Coronary artery disease    Diabetes mellitus without complication (HCC)    type 2   Essential hypertension, benign 11/02/2013   H/O jaundice    as child   Headache 2015   complicated migraine x2    Hearing trouble    Heart disease    Hepatitis    hx of hepatitis A as child   High cholesterol    under control   Long term (current) use of anticoagulants 11/02/2013   Pradaxa    Measles    as child   Mouth dryness    Mumps    as teenager   Numbness and tingling    lips   Osteoporosis    "unsure-maybe in knees"   Psoriasis    Psoriatic arthritis (HCC)    Stroke (HCC) 2012   Vertigo    being monitored, occasional    Past Surgical History:  Procedure Laterality Date   APPENDECTOMY  as child   CARDIAC CATHETERIZATION  ~2006   clear   CORONARY ARTERY BYPASS GRAFT  02/16/2005   LUMBAR DISC SURGERY  09/2004   L1-L5   TONSILLECTOMY  as child   with adenoids   TOTAL HIP ARTHROPLASTY Left 04/19/2019   Procedure: TOTAL HIP ARTHROPLASTY ANTERIOR APPROACH;  Surgeon: Ollen Gross, MD;  Location: [facility];  Service: Orthopedics;  Laterality: Left;   TOTAL HIP ARTHROPLASTY Right 10/09/2023   Procedure: TOTAL HIP ARTHROPLASTY ANTERIOR APPROACH;  Surgeon: Samson Frederic, MD;  Location: [facility];  Service: Orthopedics;  Laterality: Right;   TOTAL KNEE ARTHROPLASTY  Left 09/11/2015   Procedure: LEFT TOTAL KNEE ARTHROPLASTY;  Surgeon: Ollen Gross, MD;  Location: [facility];  Service: Orthopedics;  Laterality: Left;    Social History:  reports that she has never smoked. She has never used smokeless tobacco. She reports that she does not drink alcohol and does not use drugs.   Allergies  Allergen Reactions   Codeine Nausea And Vomiting   Penicillins Swelling and Other (See Comments)    Swelling of the tongue   Acetaminophen-Codeine Other (See Comments)    Upset the stomach   Atenolol Other (See Comments)    Reaction unconfirmed- might have just be an ineffective med   Atorvastatin Other (See Comments)    Muscle soreness   Chloramphenicol Other (See Comments)    Reaction unconfirmed   Dapagliflozin Other (See Comments)     yeast infections   Empagliflozin Other (See Comments)    Yeast infections   Liraglutide Nausea Only and Other (See Comments)    Constipation, bloating also   Metformin Hcl Nausea Only   Metoprolol Other (See Comments)    Reaction unconfirmed  Zetia [Ezetimibe] Other (See Comments)    Reaction unconfirmed- might have just be an ineffective med   Sertraline Palpitations    Family History  Problem Relation Age of Onset   Heart failure Mother    Neuromuscular disorder Father     Family history reviewed and not pertinent    Prior to Admission medications   Medication Sig Start Date End Date Taking? Authorizing Provider  acetaminophen (TYLENOL) 325 MG tablet Take 650 mg by mouth in the morning and at bedtime.   Yes [provider]  carvedilol (COREG) 6.25 MG tablet Take 1 tablet (6.25 mg total) by mouth 2 (two) times daily. Patient taking differently: Take 6.25 mg by mouth 2 (two) times daily after a meal. 02/07/23  Yes Chandrasekhar, Mahesh A, MD  Cholecalciferol (VITAMIN D3) 1000 units CAPS Take 1,000 Units by mouth daily.   Yes [provider]  CLARITIN REDITABS 10 MG dissolvable tablet Take 10 mg by  mouth in the morning.   Yes [provider]  clonazePAM (KLONOPIN) 0.5 MG tablet Take 1 tablet (0.5 mg total) by mouth at bedtime as needed for anxiety. Patient taking differently: Take 0.5 mg by mouth daily as needed for anxiety. 10/13/23  Yes Standley Brooking, MD  Cyanocobalamin (VITAMIN B-12) 2500 MCG SUBL Place 2,500 mcg under the tongue every Monday, Wednesday, and Friday.   Yes [provider]  dabigatran (PRADAXA) 150 MG CAPS capsule Take 150 mg by mouth 2 (two) times daily.   Yes [provider]  docusate sodium (COLACE) 100 MG capsule Take 100 mg by mouth at bedtime as needed for mild constipation.   Yes [provider]  ENBREL 50 MG/ML injection Inject 50 mg into the skin every Tuesday. 04/07/19  Yes [provider]  insulin glargine-yfgn (SEMGLEE) 100 UNIT/ML injection Inject 0.3 mLs (30 Units total) into the skin 2 (two) times daily. 10/13/23  Yes Standley Brooking, MD  isosorbide mononitrate (IMDUR) 30 MG 24 hr tablet Take 0.5 tablets (15 mg total) by mouth daily. 06/25/22  Yes Lyn Records, MD  Lactulose 20 GM/30ML SOLN Take 30 mLs by mouth every 4 (four) hours.   Yes [provider]  Multiple Vitamin (MULTIVITAMIN WITH MINERALS) TABS tablet Take 1 tablet by mouth daily. 10/14/23  Yes Standley Brooking, MD  nitroGLYCERIN (NITROSTAT) 0.4 MG SL tablet Place 1 tablet (0.4 mg total) under the tongue every 5 (five) minutes x 3 doses as needed for chest pain. 06/25/22  Yes Lyn Records, MD  oxyCODONE (ROXICODONE) 5 MG immediate release tablet Take 1 tablet (5 mg total) by mouth every 4 (four) hours as needed for severe pain. 10/09/23  Yes Hill, Alain Honey, PA-C  polyethylene glycol (MIRALAX / GLYCOLAX) 17 g packet Take 17 g by mouth 2 (two) times daily. 10/13/23  Yes Standley Brooking, MD  Probiotic Product (ALIGN) 4 MG CAPS Take 4 mg by mouth daily in the afternoon.   Yes [provider]  ramipril (ALTACE) 5 MG capsule TAKE ONE  CAPSULE BY MOUTH TWICE DAILY Patient taking differently: Take 5 mg by mouth 2 (two) times daily. 12/09/22  Yes Lyn Records, MD  rosuvastatin (CRESTOR) 10 MG tablet Take 10 mg by mouth at bedtime.    Yes [provider]  senna (SENOKOT) 8.6 MG TABS tablet Take 1 tablet (8.6 mg total) by mouth 2 (two) times daily. 10/13/23  Yes Standley Brooking, MD  spironolactone (ALDACTONE) 25 MG tablet Take 0.5 tablets (  12.5 mg total) by mouth daily. 06/06/23  Yes Meriam Sprague, MD  traMADol (ULTRAM) 50 MG tablet Take 50 mg by mouth every 6 (six) hours as needed for moderate pain (pain score 4-6).   Yes [provider]  TUMS ULTRA 1000 1000 MG chewable tablet Chew 1,000 mg by mouth 3 (three) times daily as needed for heartburn.   Yes [provider]  TYLENOL 8 HOUR ARTHRITIS PAIN 650 MG CR tablet Take 650 mg by mouth in the morning.   Yes [provider]  Continuous Glucose Sensor (FREESTYLE LIBRE 2 SENSOR) MISC Inject 1 Device into the skin every 14 (fourteen) days.    [provider]     Objective    Physical Exam: Vitals:   10/16/23 1915 10/16/23 1945 10/16/23 2000 10/16/23 2030  BP: (!) 116/44 (!) 148/49 (!) 110/52   Pulse: 64 71 66   Resp: 17 18 15    Temp:    98.8 F (37.1 C)  TempSrc:    Oral  SpO2: 98% 98% 97%   Weight:      Height:        General: appears to be stated age; alert, oriented Skin: warm, dry, no rash Head:  AT/Doyle Mouth:  Oral mucosa membranes appear moist, normal dentition Neck: supple; trachea midline Heart:  RRR; did not appreciate any M/R/G Lungs: CTAB, did not appreciate any wheezes, rales, or rhonchi Abdomen: + BS; soft, ND, NT Vascular: 2+ pedal pulses b/l; 2+ radial pulses b/l Extremities: no peripheral edema, no muscle wasting Neuro: sensation intact in upper and lower extremities b/l; deferred assessment of strength of the right lower extremity in the setting of acute right periprosthetic hip  fracture.    Labs on Admission: I have personally reviewed following labs and imaging studies  CBC: Recent Labs  Lab 10/10/23 0329 10/11/23 0327 10/12/23 0338 10/16/23 1251  WBC 8.7 10.3 8.7 9.3  NEUTROABS  --   --   --  5.7  HGB 10.7* 10.4* 9.9* 9.6*  HCT 33.2* 32.4* 31.2* 29.4*  MCV 93.5 95.0 94.3 93.6  PLT 125* 139* 155 193   Basic Metabolic Panel: Recent Labs  Lab 10/10/23 0329 10/11/23 0327 10/12/23 0338 10/16/23 1407  NA 131* 135 134* 138  K 4.9 4.4 4.6 4.5  CL 102 104 103 108  CO2 20* 21* 24 23  GLUCOSE 220* 173* 160* 136*  BUN 33* 47* 33* 20  CREATININE 1.24* 1.05* 0.90 0.99  CALCIUM 8.3* 8.9 8.7* 8.3*   GFR: Estimated Creatinine Clearance: 38.8 mL/min (by C-G formula based on SCr of 0.99 mg/dL). Liver Function Tests: Recent Labs  Lab 10/16/23 1407  AST 29  ALT 23  ALKPHOS 77  BILITOT 0.9  PROT 5.2*  ALBUMIN 2.5*   No results for input(s): "LIPASE", "AMYLASE" in the last 168 hours. No results for input(s): "AMMONIA" in the last 168 hours. Coagulation Profile: No results for input(s): "INR", "PROTIME" in the last 168 hours. Cardiac Enzymes: No results for input(s): "CKTOTAL", "CKMB", "CKMBINDEX", "TROPONINI" in the last 168 hours. BNP (last 3 results) No results for input(s): "PROBNP" in the last 8760 hours. HbA1C: No results for input(s): "HGBA1C" in the last 72 hours. CBG: Recent Labs  Lab 10/12/23 1149 10/12/23 1626 10/12/23 2143 10/13/23 0738 10/13/23 1150  GLUCAP 192* 168* 140* 148* 182*   Lipid Profile: No results for input(s): "CHOL", "HDL", "LDLCALC", "TRIG", "CHOLHDL", "LDLDIRECT" in the last 72 hours. Thyroid Function Tests: No results for input(s): "TSH", "T4TOTAL", "FREET4", "  T3FREE", "THYROIDAB" in the last 72 hours. Anemia Panel: No results for input(s): "VITAMINB12", "FOLATE", "FERRITIN", "TIBC", "IRON", "RETICCTPCT" in the last 72 hours. Urine analysis:    Component Value Date/Time   COLORURINE YELLOW 04/18/2019 0520    APPEARANCEUR CLEAR 04/18/2019 0520   LABSPEC 1.020 04/18/2019 0520   PHURINE 5.0 04/18/2019 0520   GLUCOSEU NEGATIVE 04/18/2019 0520   HGBUR TRACE (A) 04/18/2019 0520   BILIRUBINUR NEGATIVE 04/18/2019 0520   KETONESUR TRACE (A) 04/18/2019 0520   PROTEINUR NEGATIVE 04/18/2019 0520   UROBILINOGEN 0.2 09/05/2015 1531   NITRITE NEGATIVE 04/18/2019 0520   LEUKOCYTESUR NEGATIVE 04/18/2019 0520    Radiological Exams on Admission: CT Hip Right Wo Contrast  Result Date: 10/16/2023 CLINICAL DATA:  Hip replacement, periprosthetic fracture suspected EXAM: CT OF THE RIGHT HIP WITHOUT CONTRAST TECHNIQUE: Multidetector CT imaging of the right hip was performed according to the standard protocol. Multiplanar CT image reconstructions were also generated. RADIATION DOSE REDUCTION: This exam was performed according to the departmental dose-optimization program which includes automated exposure control, adjustment of the mA and/or kV according to patient size and/or use of iterative reconstruction technique. COMPARISON:  Plain films today FINDINGS: Bones/Joint/Cartilage Prior right hip replacement. There is lucency through the anterior and posterior cortex adjacent to the shaft component compatible with periprosthetic nondisplaced fracture. No subluxation or dislocation. Ligaments Suboptimally assessed by CT. Muscles and Tendons Unremarkable Soft tissues Soft tissue stranding in the lateral subcutaneous soft tissues. IMPRESSION: Nondisplaced periprosthetic fracture adjacent to the right hip replacement shaft component. Electronically Signed   By: Charlett Nose M.D.   On: 10/16/2023 22:12   DG Hip Unilat With Pelvis 2-3 Views Right  Result Date: 10/16/2023 CLINICAL DATA:  Right hip pain. Recent hip surgery. Fall at nursing facility. EXAM: DG HIP (WITH OR WITHOUT PELVIS) 2-3V RIGHT COMPARISON:  Pelvis and right hip radiographs 10/07/2023 FINDINGS: Interval total right hip arthroplasty. Redemonstration of prior  total left hip arthroplasty. No perihardware lucency is seen to indicate hardware failure or loosening. Expected right hip postoperative subcutaneous air. Anterior surgical skin staples. Moderate pubic symphysis joint space narrowing, subchondral sclerosis, and peripheral osteophytosis. No acute fracture or dislocation. IMPRESSION: 1. Interval total right hip arthroplasty. No evidence of hardware failure or loosening. 2. No acute fracture or dislocation. Electronically Signed   By: Neita Garnet M.D.   On: 10/16/2023 17:58   DG Chest Port 1 View  Result Date: 10/16/2023 CLINICAL DATA:  Syncope and loss of consciousness EXAM: PORTABLE CHEST 1 VIEW COMPARISON:  10/07/2023 FINDINGS: Sternotomy and CABG. Stable cardiomediastinal silhouette. Aortic atherosclerotic calcification. Low lung volumes accentuate pulmonary vascularity. No focal consolidation, pleural effusion, or pneumothorax. No displaced rib fractures. IMPRESSION: No acute cardiopulmonary disease. Electronically Signed   By: Minerva Fester M.D.   On: 10/16/2023 15:25      Assessment/Plan   Principal Problem:   Periprosthetic fracture around internal prosthetic right hip joint (HCC) Active Problems:   Essential hypertension   DM2 (diabetes mellitus, type 2) (HCC)   Syncope, vasovagal   Postoperative anemia   Paroxysmal atrial flutter (HCC)   Hyperlipidemia   Chronic diastolic CHF (congestive heart failure) (HCC)     #) right periprosthetic right hip fracture: Following recent right total hip arthroplasty on 10/09/2023, the patient presents with significant exacerbation of intensity of discomfort associate the right hip, with ensuing CT right hip showing evidence of nondisplaced periprosthetic fracture adjacent to the right hip replacement shaft component.  While there has been no interval trauma following her right  total hip arthroplasty, suspect potential contribution from a vigorous preceding bowel movement in the context of no  preceding bowel movement over the last 9 days.  EDP has discussed patient's case with on-call [facility], who will formally consult and see the patient in the morning to determine plan regarding surgical versus nonsurgical intervention, although [facility] at this time suspects that recommendations will be nonsurgical in nature, particular given the nondisplaced nature of this.  Prostatic right hip fracture.  Right lower extremity appears neurovascular tact at this time.  Plan: [facility] to formally consult, as above.  Will keep the patient n.p.o. after midnight pending formal evaluation by [facility] in the morning to determine surgical versus nonsurgical intervention, as above.  Prn IV fentanyl.  Type and screen ordered.  Check INR.  All Russians ordered.                #) Vasovagal syncope: Single episode of syncope with prodrome, that occurred as the patient was bearing down to engage in a substantial bowel movement after not moving her bowels over the preceding 9 days.  This appears consistent with vasovagal syncope related to defecation, with further exacerbation as a consequence of relative intravascular depletion as a consequence of subacute postoperative anemia, as further detailed below, and further exacerbated by diminished compensatory vasoconstriction and compensatory tachycardia as a consequence of pharmacologic factors that include outpatient use of [place]/ramipril as well as Coreg, respectively.   ACS appears much less likely, in the absence of any associated chest pain, will troponin x 2 are nonelevated, and EKG shows no evidence of acute ischemic changes, including no evidence of ST elevation.  Plan: Monitor on symmetry.  Fall precautions ordered.  Monitor strict I's and O's and daily weights.  Further evaluation management of subacute postoperative anemia, as further detailed below.                 #) Subacute postoperative anemia: Gradual trend down  and patient's anemia following her right total hip arthroplasty on 10/09/2023, as further quantified above, and relative to her preoperative baseline hemoglobin range of 12-13.  No evidence of acute blood loss at this time.    Plan: Type and screen ordered.  Check INR.  Add on iron studies.  Repeat CBC in the morning.                 #) Paroxysmal atrial flutter: Documented history of such. In setting of CHA2DS2-VASc score of 6, there is an indication for chronic anticoagulation for thromboembolic prophylaxis. Consistent with this, patient is chronically anticoagulated on Pradaxa. Home AV nodal blocking regimen: Coreg.  Most recent echocardiogram was performed in February 2023, with results notable for LVEF 55 to 60%, grade 1 diastolic dysfunction, normal right ventricular systolic function and no evidence of significant valvular pathology.  Appears to be in sinus rhythm per presenting EKG, without overt evidence of acute ischemic changes.   Plan: monitor strict I's & O's and daily weights. CMP/CBC in AM. Check serum mag level.  In the setting of current n.p.o. status, pending [facility] evaluation in the morning, will hold home Coreg for now.  Will plan to resume Pradaxa following decision making regarding surgical versus nonsurgical intervention for her presenting right periprosthetic hip fracture .                     #) Type 2 Diabetes Mellitus: documented history of such. Home insulin regimen: Lantus 30 units SQ twice daily. Home oral hypoglycemic agents: None.  presenting blood sugar: 136. Most recent A1c noted to be 7.3% when checked on 10/08/2023. in terms of initial dose of basal insulin to be started during this hospitalization, will resume approximately half of outpatient dose in order to reduce risk for ensuing hypoglycemia  Plan: In the context of current n.p.o. status, will pursue Accu-Cheks on a every 6 hour basis with associated low-dose sliding scale  insulin.  Insulin glargine 12 units SQ twice daily, as above.                 #) Essential Hypertension: documented h/o such, with outpatient antihypertensive regimen including Coreg, ramipril, Imdur.  SBP's in the ED today: Low 100s to 140s mmHg.   Plan: Close monitoring of subsequent BP via routine VS. in the setting of current n.p.o. status, will hold home hypertensive medications for now.  Monitor strict I's and O's and daily weights.                    #) Hyperlipidemia: documented h/o such. On rosuvastatin as outpatient.   Plan: In the setting of current n.p.o. status, will hold home statin for now.                   #) Chronic diastolic heart failure: documented history of such, with most recent echocardiogram performed February 2023, with results notable for grade 1 diastolic dysfunction, and additional results as conveyed above. No clinical or radiographic evidence to suggest acutely decompensated heart failure at this time. home diuretic regimen reportedly consists of the following: Spironolactone.   Plan: monitor strict I's & O's and daily weights. Repeat CMP in AM. Check serum mag level.  In the setting of current n.p.o. status, will hold him spironolactone for now.       DVT prophylaxis: SCD's   Code Status: Full code Family Communication: none Disposition Plan: Per Rounding Team Consults called: EDP d/w on-call [facility], Dr. Rennis Chris, who will formally consult, as further detailed above.   Admission status: Inpatient     I SPENT GREATER THAN 75  MINUTES IN CLINICAL CARE TIME/MEDICAL DECISION-MAKING IN COMPLETING THIS ADMISSION.      Chaney Born Beata Beason DO [facility]  From 7PM - 7AM   10/16/2023, 11:20 PM

## 2023-10-16 NOTE — ED Triage Notes (Addendum)
Pt to ED via [facility] from [facility], rehab for recent hip fracture sx. . Pt had a witnessed syncopal episode .  No fall, Did not hit head. Pt was actually working with Physical therapy, and felt dizzy prior to episode. Orientation now at baseline. Pt was hypotensive on EMS arrival, initial bp 102 systolic.  Pt takes atenolol. No blood thinner,  #18 L forearm fluid bolus given by EMS.   106/50, HR 60, 106cbg, 94% 3L, home o2 user.

## 2023-10-16 NOTE — ED Provider Notes (Signed)
[place] EMERGENCY DEPARTMENT AT [facility] Provider Note   CSN: [id number] Arrival date & time: 10/16/23  1222     History  Chief Complaint  Patient presents with   Loss of Consciousness    [name] is a 89 y.o. female.  Patient is an 89 year old female with a history of atrial flutter on Pradaxa, CAD, stroke, diabetes, recent fall with a hip replacement last week who is currently in rehab and presents today after a syncopal event.  Patient reports that she had not had a bowel movement in 9 days and today after physical therapy when she was going back to her room she had them stop so she can use the bathroom.  She reports it was the first time she had a large bowel movement and when standing up she started feeling very dizzy and told them she was going to pass out.  They got her in a wheelchair and took her back to her room but she did have an episode of loss of consciousness.  When EMS arrived patient's blood pressure was low in the 80s but she was mentating okay.  She also reported when she went to the bathroom she had a sudden severe pain in her right leg that radiated down but it is now gone.  She states that this time she has no pain she denies any shortness of breath or chest pain.  She reports other than being cold she feels fine.  Staff reported she did not fall or injure herself during this event because they were present the entire time.  Patient also reports that since being in rehab she has not really been eating or drinking much because she had not been having bowel movements she had no appetite and the food tasted bad.  No recent change in medications.  She states overall she thought she was doing well since the surgery she had even gotten up a few times on her own and gone to the bathroom and felt fine.  The history is provided by the patient, the EMS personnel and the nursing home.  Loss of Consciousness      Home Medications Prior to Admission  medications   Medication Sig Start Date End Date Taking? Authorizing Provider  acetaminophen (TYLENOL) 325 MG tablet Take 650 mg by mouth in the morning and at bedtime.   Yes [provider]  carvedilol (COREG) 6.25 MG tablet Take 1 tablet (6.25 mg total) by mouth 2 (two) times daily. Patient taking differently: Take 6.25 mg by mouth 2 (two) times daily after a meal. 02/07/23  Yes Chandrasekhar, Mahesh A, MD  Cholecalciferol (VITAMIN D3) 1000 units CAPS Take 1,000 Units by mouth daily.   Yes [provider]  CLARITIN REDITABS 10 MG dissolvable tablet Take 10 mg by mouth in the morning.   Yes [provider]  clonazePAM (KLONOPIN) 0.5 MG tablet Take 1 tablet (0.5 mg total) by mouth at bedtime as needed for anxiety. Patient taking differently: Take 0.5 mg by mouth daily as needed for anxiety. 10/13/23  Yes Standley Brooking, MD  Cyanocobalamin (VITAMIN B-12) 2500 MCG SUBL Place 2,500 mcg under the tongue every Monday, Wednesday, and Friday.   Yes [provider]  dabigatran (PRADAXA) 150 MG CAPS capsule Take 150 mg by mouth 2 (two) times daily.   Yes [provider]  docusate sodium (COLACE) 100 MG capsule Take 100 mg by mouth at bedtime as needed for mild constipation.   Yes [provider]  ENBREL 50 MG/ML injection Inject 50 mg into the skin every Tuesday. 04/07/19  Yes [provider]  insulin glargine-yfgn (SEMGLEE) 100 UNIT/ML injection Inject 0.3 mLs (30 Units total) into the skin 2 (two) times daily. 10/13/23  Yes Standley Brooking, MD  isosorbide mononitrate (IMDUR) 30 MG 24 hr tablet Take 0.5 tablets (15 mg total) by mouth daily. 06/25/22  Yes Lyn Records, MD  Lactulose 20 GM/30ML SOLN Take 30 mLs by mouth every 4 (four) hours.   Yes [provider]  Multiple Vitamin (MULTIVITAMIN WITH MINERALS) TABS tablet Take 1 tablet by mouth daily. 10/14/23  Yes Standley Brooking, MD  nitroGLYCERIN (NITROSTAT) 0.4 MG SL tablet  Place 1 tablet (0.4 mg total) under the tongue every 5 (five) minutes x 3 doses as needed for chest pain. 06/25/22  Yes Lyn Records, MD  oxyCODONE (ROXICODONE) 5 MG immediate release tablet Take 1 tablet (5 mg total) by mouth every 4 (four) hours as needed for severe pain. 10/09/23  Yes Hill, Alain Honey, PA-C  polyethylene glycol (MIRALAX / GLYCOLAX) 17 g packet Take 17 g by mouth 2 (two) times daily. 10/13/23  Yes Standley Brooking, MD  Probiotic Product (ALIGN) 4 MG CAPS Take 4 mg by mouth daily in the afternoon.   Yes [provider]  ramipril (ALTACE) 5 MG capsule TAKE ONE CAPSULE BY MOUTH TWICE DAILY Patient taking differently: Take 5 mg by mouth 2 (two) times daily. 12/09/22  Yes Lyn Records, MD  rosuvastatin (CRESTOR) 10 MG tablet Take 10 mg by mouth at bedtime.    Yes [provider]  senna (SENOKOT) 8.6 MG TABS tablet Take 1 tablet (8.6 mg total) by mouth 2 (two) times daily. 10/13/23  Yes Standley Brooking, MD  spironolactone (ALDACTONE) 25 MG tablet Take 0.5 tablets (12.5 mg total) by mouth daily. 06/06/23  Yes Meriam Sprague, MD  traMADol (ULTRAM) 50 MG tablet Take 50 mg by mouth every 6 (six) hours as needed for moderate pain (pain score 4-6).   Yes [provider]  TUMS ULTRA 1000 1000 MG chewable tablet Chew 1,000 mg by mouth 3 (three) times daily as needed for heartburn.   Yes [provider]  TYLENOL 8 HOUR ARTHRITIS PAIN 650 MG CR tablet Take 650 mg by mouth in the morning.   Yes [provider]  Continuous Glucose Sensor (FREESTYLE LIBRE 2 SENSOR) MISC Inject 1 Device into the skin every 14 (fourteen) days.    [provider]      Allergies    Codeine, Penicillins, Acetaminophen-codeine, Atenolol, Atorvastatin, Chloramphenicol, Dapagliflozin, Empagliflozin, Liraglutide, Metformin hcl, Metoprolol, Zetia [ezetimibe], and Sertraline    Review of Systems   Review of Systems  Cardiovascular:  Positive for syncope.     Physical Exam Updated Vital Signs BP (!) 111/50   Pulse 67   Temp 97.7 F (36.5 C) (Oral)   Resp 15   Ht 5' (1.524 m)   Wt 85.3 kg   SpO2 99%   BMI 36.72 kg/m  Physical Exam Vitals and nursing note reviewed.  Constitutional:      General: She is not in acute distress.    Appearance: She is well-developed.  HENT:     Head: Normocephalic and atraumatic.     Mouth/Throat:     Mouth: Mucous membranes are dry.  Eyes:     Pupils: Pupils are equal, round, and reactive to light.  Cardiovascular:     Rate and  Rhythm: Normal rate and regular rhythm.     Heart sounds: Normal heart sounds. No murmur heard.    No friction rub.  Pulmonary:     Effort: Pulmonary effort is normal.     Breath sounds: Normal breath sounds. No wheezing or rales.  Abdominal:     General: Bowel sounds are normal. There is no distension.     Palpations: Abdomen is soft.     Tenderness: There is no abdominal tenderness. There is no guarding or rebound.  Musculoskeletal:        General: No tenderness. Normal range of motion.     Comments: No edema.  Able to range the right leg, surgical bandage in place and clean and dry.  No significant pain with ranging of the leg.  No swelling noted in either leg  Skin:    General: Skin is warm and dry.     Coloration: Skin is pale.     Findings: No rash.  Neurological:     Mental Status: She is alert and oriented to person, place, and time. Mental status is at baseline.     Cranial Nerves: No cranial nerve deficit.  Psychiatric:        Behavior: Behavior normal.     ED Results / Procedures / Treatments   Labs (all labs ordered are listed, but only abnormal results are displayed) Labs Reviewed  CBC WITH DIFFERENTIAL/PLATELET - Abnormal; Notable for the following components:      Result Value   RBC 3.14 (*)    Hemoglobin 9.6 (*)    HCT 29.4 (*)    Abs Immature Granulocytes 0.21 (*)    All other components within normal limits  COMPREHENSIVE METABOLIC PANEL  - Abnormal; Notable for the following components:   Glucose, Bld 136 (*)    Calcium 8.3 (*)    Total Protein 5.2 (*)    Albumin 2.5 (*)    GFR, Estimated 55 (*)    All other components within normal limits  TROPONIN I (HIGH SENSITIVITY)  TROPONIN I (HIGH SENSITIVITY)    EKG EKG Interpretation Date/Time:  Thursday October 16 2023 12:29:55 EDT Ventricular Rate:  59 PR Interval:  213 QRS Duration:  142 QT Interval:  500 QTC Calculation: 496 R Axis:   -16  Text Interpretation: Sinus rhythm Borderline prolonged PR interval Left bundle branch block No significant change since last tracing Confirmed by Gwyneth Sprout ([id number]) on 10/16/2023 3:03:18 PM  Radiology DG Chest Port 1 View  Result Date: 10/16/2023 CLINICAL DATA:  Syncope and loss of consciousness EXAM: PORTABLE CHEST 1 VIEW COMPARISON:  10/07/2023 FINDINGS: Sternotomy and CABG. Stable cardiomediastinal silhouette. Aortic atherosclerotic calcification. Low lung volumes accentuate pulmonary vascularity. No focal consolidation, pleural effusion, or pneumothorax. No displaced rib fractures. IMPRESSION: No acute cardiopulmonary disease. Electronically Signed   By: Minerva Fester M.D.   On: 10/16/2023 15:25    Procedures Procedures    Medications Ordered in ED Medications - No data to display  ED Course/ Medical Decision Making/ A&P Clinical Course as of 10/16/23 1654  Thu Oct 16, 2023  1644 Recevied sign out from Dr. Anitra Lauth, patient with episode of defecation syncope at rehab facility after not having bowel movement for 9 days. Now back at baseline, able to stand without recurrent symptoms. On anticoagulation, no falls or trauma. Didn't fall but now having right hip pain, will re-xray [WS]    Clinical Course User Index [WS] Suezanne Jacquet Jerilee Field, MD  Medical Decision Making Amount and/or Complexity of Data Reviewed Independent Historian: EMS External Data Reviewed: notes. Labs: ordered.  Decision-making details documented in ED Course. Radiology: ordered and independent interpretation performed. Decision-making details documented in ED Course. ECG/medicine tests: ordered and independent interpretation performed. Decision-making details documented in ED Course.  Risk OTC drugs.   Pt with multiple medical problems and comorbidities and presenting today with a complaint that caries a high risk for morbidity and mortality.  Here today after a syncopal event.  Patient had what sounds like a vasovagal event after having a large bowel movement at her rehab facility.  She initially was hypotensive on EMS arrival but received a total of 500 mL of fluid and blood pressure has been stable here.  She has no complaints at this time.  She is requesting something to eat.  Her leg was ranged and moved and she does not have significant pain at this time.  Low suspicion for PE as patient has been on Pradaxa since the surgery and leg is not swollen or showing signs of DVT at this time.  Lower suspicion for dysrhythmia at this time.  Patient has been on continuous cardiac monitoring and heart rate has been in the 60s.  I independently interpreted patient's EKG which shows a sinus rhythm with occasional PVC and a left bundle branch block which is unchanged.  I independently interpreted patient's labs and CMP today with unchanged creatinine and electrolytes, anion gap is normal, CBC with hemoglobin of 9.6 which is unchanged from discharge since her hip surgery, troponin is negative and flat.  I have independently visualized and interpreted pt's images today.  Chest x-ray within normal limits today.  All the findings discussed with the patient and her son who is present at bedside.  Will attempt to have the patient's stand to make sure she is not having new pain in her hip which she had complained of earlier and to ensure that she is not feeling symptomatic.  Patient is on multiple blood pressure medications but  these have been chronic medications for some time and blood pressures had been doing well on these medications prior to being hospitalized and during hospitalization.  4:55 PM When patient got up she was able to go to a bedside potty but then was having severe pain in her right leg which prohibited her from getting back in bed alone.  She denied any dizziness with this.  Will image the right hip to ensure no acute abnormality after recent surgery.  Patient also given Tylenol as she has not had anything out in a while for pain.  Findings were discussed with the patient and her family.          Final Clinical Impression(s) / ED Diagnoses Final diagnoses:  None    Rx / DC Orders ED Discharge Orders     None         Gwyneth Sprout, MD 10/16/23 1655

## 2023-10-16 NOTE — ED Notes (Signed)
After review of records from facility - pt is on blood thinner. Pradaxa

## 2023-10-16 NOTE — ED Provider Notes (Signed)
    ED Course / MDM   Clinical Course as of 10/16/23 2315  Thu Oct 16, 2023  1644 Recevied sign out from Dr. Anitra Lauth, patient with episode of defecation syncope at rehab facility after not having bowel movement for 9 days. Now back at baseline, able to stand without recurrent symptoms. On anticoagulation, no falls or trauma. Didn't fall but now having right hip pain, will re-xray [WS]  2233 On further clarification does not appear that patient is able to actually bear weight on the right hip.  X-ray was negative but given level of pain with weightbearing CT was pursued which does show appears prosthetic fracture.  Discussed this with the patient's son.  Will discuss with the patient's orthopedic team.  Unclear if this would change management. [WS]  2241 Discussed with Dr. Rennis Chris who is on-call for [facility].  He recommends that the patient be admitted.  They will consult.  He is not sure whether the patient will need an operative intervention or not yet.  Discussed this with the patient's son Dr. Truett Perna.  Will admit to hospitalist service. [WS]  2314 Discussed with Dr. Arlean Hopping who will admit patient.  [WS]    Clinical Course User Index [WS] Lonell Grandchild, MD   Medical Decision Making Amount and/or Complexity of Data Reviewed Labs: ordered. Radiology: ordered.  Risk OTC drugs. Prescription drug management. Decision regarding hospitalization.          Lonell Grandchild, MD 10/16/23 [phone]

## 2023-10-17 ENCOUNTER — Encounter (HOSPITAL_COMMUNITY): Payer: Self-pay | Admitting: Internal Medicine

## 2023-10-17 DIAGNOSIS — E1165 Type 2 diabetes mellitus with hyperglycemia: Secondary | ICD-10-CM | POA: Diagnosis not present

## 2023-10-17 DIAGNOSIS — I5032 Chronic diastolic (congestive) heart failure: Secondary | ICD-10-CM | POA: Diagnosis present

## 2023-10-17 DIAGNOSIS — E785 Hyperlipidemia, unspecified: Secondary | ICD-10-CM | POA: Diagnosis present

## 2023-10-17 DIAGNOSIS — R55 Syncope and collapse: Secondary | ICD-10-CM | POA: Diagnosis not present

## 2023-10-17 DIAGNOSIS — I4892 Unspecified atrial flutter: Secondary | ICD-10-CM | POA: Diagnosis present

## 2023-10-17 DIAGNOSIS — D649 Anemia, unspecified: Secondary | ICD-10-CM | POA: Diagnosis present

## 2023-10-17 DIAGNOSIS — M9701XA Periprosthetic fracture around internal prosthetic right hip joint, initial encounter: Secondary | ICD-10-CM | POA: Diagnosis not present

## 2023-10-17 LAB — COMPREHENSIVE METABOLIC PANEL
ALT: 23 U/L (ref 0–44)
AST: 22 U/L (ref 15–41)
Albumin: 2.6 g/dL — ABNORMAL LOW (ref 3.5–5.0)
Alkaline Phosphatase: 76 U/L (ref 38–126)
Anion gap: 10 (ref 5–15)
BUN: 18 mg/dL (ref 8–23)
CO2: 24 mmol/L (ref 22–32)
Calcium: 8.5 mg/dL — ABNORMAL LOW (ref 8.9–10.3)
Chloride: 103 mmol/L (ref 98–111)
Creatinine, Ser: 0.87 mg/dL (ref 0.44–1.00)
GFR, Estimated: >60 mL/min (ref >60)
Glucose, Bld: 109 mg/dL — ABNORMAL HIGH (ref 70–99)
Potassium: 3.9 mmol/L (ref 3.5–5.1)
Sodium: 137 mmol/L (ref 135–145)
Total Bilirubin: 0.7 mg/dL (ref 0.3–1.2)
Total Protein: 5.6 g/dL — ABNORMAL LOW (ref 6.5–8.1)

## 2023-10-17 LAB — CBC WITH DIFFERENTIAL/PLATELET
Abs Immature Granulocytes: 0.1 10*3/uL — ABNORMAL HIGH (ref 0.00–0.07)
Basophils Absolute: 0 10*3/uL (ref 0.0–0.1)
Basophils Relative: 0 %
Eosinophils Absolute: 0.5 10*3/uL (ref 0.0–0.5)
Eosinophils Relative: 5 %
HCT: 29.1 % — ABNORMAL LOW (ref 36.0–46.0)
Hemoglobin: 9.5 g/dL — ABNORMAL LOW (ref 12.0–15.0)
Immature Granulocytes: 1 %
Lymphocytes Relative: 26 %
Lymphs Abs: 2.5 10*3/uL (ref 0.7–4.0)
MCH: 29.3 pg (ref 26.0–34.0)
MCHC: 32.6 g/dL (ref 30.0–36.0)
MCV: 89.8 fL (ref 80.0–100.0)
Monocytes Absolute: 0.9 10*3/uL (ref 0.1–1.0)
Monocytes Relative: 9 %
Neutro Abs: 5.4 10*3/uL (ref 1.7–7.7)
Neutrophils Relative %: 59 %
Platelets: 236 10*3/uL (ref 150–400)
RBC: 3.24 MIL/uL — ABNORMAL LOW (ref 3.87–5.11)
RDW: 12.7 % (ref 11.5–15.5)
WBC: 9.4 10*3/uL (ref 4.0–10.5)
nRBC: 0 % (ref 0.0–0.2)

## 2023-10-17 LAB — FOLATE: Folate: 14.4 ng/mL (ref >5.9)

## 2023-10-17 LAB — FERRITIN: Ferritin: 72 ng/mL (ref 11–307)

## 2023-10-17 LAB — RETICULOCYTES
Immature Retic Fract: 18 % — ABNORMAL HIGH (ref 2.3–15.9)
RBC.: 3.22 MIL/uL — ABNORMAL LOW (ref 3.87–5.11)
Retic Count, Absolute: 107.2 10*3/uL (ref 19.0–186.0)
Retic Ct Pct: 3.3 % — ABNORMAL HIGH (ref 0.4–3.1)

## 2023-10-17 LAB — TYPE AND SCREEN
ABO/RH(D): A POS
Antibody Screen: NEGATIVE

## 2023-10-17 LAB — GLUCOSE, CAPILLARY
Glucose-Capillary: 116 mg/dL — ABNORMAL HIGH (ref 70–99)
Glucose-Capillary: 119 mg/dL — ABNORMAL HIGH (ref 70–99)
Glucose-Capillary: 139 mg/dL — ABNORMAL HIGH (ref 70–99)
Glucose-Capillary: 141 mg/dL — ABNORMAL HIGH (ref 70–99)
Glucose-Capillary: 201 mg/dL — ABNORMAL HIGH (ref 70–99)
Glucose-Capillary: 79 mg/dL (ref 70–99)

## 2023-10-17 LAB — PROTIME-INR
INR: 1.3 — ABNORMAL HIGH (ref 0.8–1.2)
Prothrombin Time: 16.3 s — ABNORMAL HIGH (ref 11.4–15.2)

## 2023-10-17 LAB — IRON AND TIBC
Iron: 41 ug/dL (ref 28–170)
Saturation Ratios: 16 % (ref 10.4–31.8)
TIBC: 258 ug/dL (ref 250–450)
UIBC: 217 ug/dL

## 2023-10-17 LAB — VITAMIN B12: Vitamin B-12: 1985 pg/mL — ABNORMAL HIGH (ref 180–914)

## 2023-10-17 LAB — MAGNESIUM: Magnesium: 2.1 mg/dL (ref 1.7–2.4)

## 2023-10-17 MED ORDER — INSULIN GLARGINE-YFGN 100 UNIT/ML ~~LOC~~ SOLN
12.0000 [IU] | Freq: Two times a day (BID) | SUBCUTANEOUS | Status: DC
  Administered 2023-10-17 – 2023-10-18 (×2): 12 [IU] via SUBCUTANEOUS
  Filled 2023-10-17 (×5): qty 0.12

## 2023-10-17 MED ORDER — CARVEDILOL 3.125 MG PO TABS
3.1250 mg | ORAL_TABLET | Freq: Two times a day (BID) | ORAL | Status: DC
  Administered 2023-10-17 – 2023-10-20 (×7): 3.125 mg via ORAL
  Filled 2023-10-17 (×7): qty 1

## 2023-10-17 MED ORDER — CLONAZEPAM 0.5 MG PO TABS
0.5000 mg | ORAL_TABLET | Freq: Every day | ORAL | Status: DC | PRN
  Administered 2023-10-18 (×2): 0.5 mg via ORAL
  Filled 2023-10-17 (×2): qty 1

## 2023-10-17 MED ORDER — DABIGATRAN ETEXILATE MESYLATE 150 MG PO CAPS
150.0000 mg | ORAL_CAPSULE | Freq: Two times a day (BID) | ORAL | Status: DC
  Administered 2023-10-17 – 2023-10-20 (×6): 150 mg via ORAL
  Filled 2023-10-17 (×6): qty 1

## 2023-10-17 MED ORDER — ACETAMINOPHEN 500 MG PO TABS
1000.0000 mg | ORAL_TABLET | Freq: Three times a day (TID) | ORAL | Status: DC
  Administered 2023-10-17 – 2023-10-20 (×9): 1000 mg via ORAL
  Filled 2023-10-17 (×9): qty 2

## 2023-10-17 MED ORDER — INSULIN ASPART 100 UNIT/ML IJ SOLN
0.0000 [IU] | Freq: Four times a day (QID) | INTRAMUSCULAR | Status: DC
  Administered 2023-10-17: 3 [IU] via SUBCUTANEOUS
  Administered 2023-10-17: 1 [IU] via SUBCUTANEOUS
  Administered 2023-10-18: 3 [IU] via SUBCUTANEOUS
  Administered 2023-10-18: 2 [IU] via SUBCUTANEOUS

## 2023-10-17 MED ORDER — POLYETHYLENE GLYCOL 3350 17 G PO PACK: 17.0000 g | PACK | Freq: Every day | ORAL | Status: DC | PRN

## 2023-10-17 MED ORDER — ROSUVASTATIN CALCIUM 5 MG PO TABS
10.0000 mg | ORAL_TABLET | Freq: Every day | ORAL | Status: DC
  Administered 2023-10-17 – 2023-10-19 (×3): 10 mg via ORAL
  Filled 2023-10-17 (×3): qty 2

## 2023-10-17 MED ORDER — SENNOSIDES-DOCUSATE SODIUM 8.6-50 MG PO TABS: 1.0000 | ORAL_TABLET | Freq: Two times a day (BID) | ORAL | Status: DC | PRN

## 2023-10-17 MED ORDER — DOCUSATE SODIUM 100 MG PO CAPS
100.0000 mg | ORAL_CAPSULE | Freq: Two times a day (BID) | ORAL | Status: DC
  Administered 2023-10-17: 100 mg via ORAL
  Filled 2023-10-17: qty 1

## 2023-10-17 MED ORDER — TRAMADOL HCL 50 MG PO TABS
50.0000 mg | ORAL_TABLET | Freq: Two times a day (BID) | ORAL | Status: DC | PRN
  Administered 2023-10-17 – 2023-10-18 (×2): 50 mg via ORAL
  Filled 2023-10-17 (×3): qty 1

## 2023-10-17 NOTE — Consult Note (Signed)
Reason for Consult: Increased right hip pain after right total hip arthroplasty 10/09/2023 for the treatment of right hip fracture Referring Physician: EDP and hospitalist service  HPI: [name] is an 89 y.o. female with PMH including atrial fibrillation on Pradaxa, CABG, rheumatoid arthritis, recent mechanical fall with right hip fracture, status post total hip arthroplasty by anterior approach by Dr. Samson Frederic on 10/09/2023.  Was doing reasonably well but developed significantly increased right hip pain after either a physical therapy session or a syncopal episode which apparently was witnessed and did not show obvious impact on the hip.  Regardless patient's had increasing right hip pain with inability to bear weight since yesterday.  Readmitted last night to the hospitalist service.  This a.m. patient reports moderate right hip pain with very limited hip motion.  Patient is somewhat confused as to the recent history and has just arrived to her hospital room.  Her report this morning is that the hip had become painful after therapy session.  Past Medical History:  Diagnosis Date   Anginal pain (HCC)    "saw doctor-think it was esophagus spasm"   Arthritis    Atrial flutter (HCC) 11/02/2013   Cataract    left eye   Coronary artery disease    Diabetes mellitus without complication (HCC)    type 2   Essential hypertension, benign 11/02/2013   H/O jaundice    as child   Headache 2015   complicated migraine x2    Hearing trouble    Heart disease    Hepatitis    hx of hepatitis A as child   High cholesterol    under control   Long term (current) use of anticoagulants 11/02/2013   Pradaxa    Measles    as child   Mouth dryness    Mumps    as teenager   Numbness and tingling    lips   Osteoporosis    "unsure-maybe in knees"   Psoriasis    Psoriatic arthritis (HCC)    Stroke (HCC) 2012   Vertigo    being monitored, occasional    Past Surgical History:  Procedure  Laterality Date   APPENDECTOMY  as child   CARDIAC CATHETERIZATION  ~2006   clear   CORONARY ARTERY BYPASS GRAFT  02/16/2005   LUMBAR DISC SURGERY  09/2004   L1-L5   TONSILLECTOMY  as child   with adenoids   TOTAL HIP ARTHROPLASTY Left 04/19/2019   Procedure: TOTAL HIP ARTHROPLASTY ANTERIOR APPROACH;  Surgeon: Ollen Gross, MD;  Location: [facility];  Service: Orthopedics;  Laterality: Left;   TOTAL HIP ARTHROPLASTY Right 10/09/2023   Procedure: TOTAL HIP ARTHROPLASTY ANTERIOR APPROACH;  Surgeon: Samson Frederic, MD;  Location: [facility];  Service: Orthopedics;  Laterality: Right;   TOTAL KNEE ARTHROPLASTY Left 09/11/2015   Procedure: LEFT TOTAL KNEE ARTHROPLASTY;  Surgeon: Ollen Gross, MD;  Location: [facility];  Service: Orthopedics;  Laterality: Left;    Family History  Problem Relation Age of Onset   Heart failure Mother    Neuromuscular disorder Father     Social History:  reports that she has never smoked. She has never used smokeless tobacco. She reports that she does not drink alcohol and does not use drugs.  Allergies:  Allergies  Allergen Reactions   Codeine Nausea And Vomiting   Penicillins Swelling and Other (See Comments)    Swelling of the tongue   Acetaminophen-Codeine Other (See Comments)    Upset the stomach  Atenolol Other (See Comments)    Reaction unconfirmed- might have just be an ineffective med   Atorvastatin Other (See Comments)    Muscle soreness   Chloramphenicol Other (See Comments)    Reaction unconfirmed   Dapagliflozin Other (See Comments)     yeast infections   Empagliflozin Other (See Comments)    Yeast infections   Liraglutide Nausea Only and Other (See Comments)    Constipation, bloating also   Metformin Hcl Nausea Only   Metoprolol Other (See Comments)    Reaction unconfirmed   Zetia [Ezetimibe] Other (See Comments)    Reaction unconfirmed- might have just be an ineffective med   Sertraline Palpitations    Medications: I have reviewed  the patient's current medications.  Results for orders placed or performed during the hospital encounter of 10/16/23 (from the past 48 hour(s))  CBC with Differential/Platelet     Status: Abnormal   Collection Time: 10/16/23 12:51 PM  Result Value Ref Range   WBC 9.3 4.0 - 10.5 K/uL   RBC 3.14 (L) 3.87 - 5.11 MIL/uL   Hemoglobin 9.6 (L) 12.0 - 15.0 g/dL   HCT 47.8 (L) 29.5 - 62.1 %   MCV 93.6 80.0 - 100.0 fL   MCH 30.6 26.0 - 34.0 pg   MCHC 32.7 30.0 - 36.0 g/dL   RDW 30.8 65.7 - 84.6 %   Platelets 193 150 - 400 K/uL   nRBC 0.0 0.0 - 0.2 %   Neutrophils Relative % 62 %   Neutro Abs 5.7 1.7 - 7.7 K/uL   Lymphocytes Relative 22 %   Lymphs Abs 2.1 0.7 - 4.0 K/uL   Monocytes Relative 10 %   Monocytes Absolute 0.9 0.1 - 1.0 K/uL   Eosinophils Relative 4 %   Eosinophils Absolute 0.4 0.0 - 0.5 K/uL   Basophils Relative 0 %   Basophils Absolute 0.0 0.0 - 0.1 K/uL   Immature Granulocytes 2 %   Abs Immature Granulocytes 0.21 (H) 0.00 - 0.07 K/uL    Comment: Performed at [facility], [address] [place], [place] [address]  Troponin I (High Sensitivity)     Status: None   Collection Time: 10/16/23 12:51 PM  Result Value Ref Range   Troponin I (High Sensitivity) 9 <18 ng/L    Comment: (NOTE) Elevated high sensitivity troponin I (hsTnI) values and significant  changes across serial measurements may suggest ACS but many other  chronic and acute conditions are known to elevate hsTnI results.  Refer to the Links section for chest pain algorithms and additional  guidance. Performed at [facility], [address], [place], [place] [address]   Comprehensive metabolic panel     Status: Abnormal   Collection Time: 10/16/23  2:07 PM  Result Value Ref Range   Sodium 138 135 - 145 mmol/L   Potassium 4.5 3.5 - 5.1 mmol/L   Chloride 108 98 - 111 mmol/L   CO2 23 22 - 32 mmol/L   Glucose, Bld 136 (H) 70 - 99 mg/dL    Comment: Glucose reference range applies only to samples  taken after fasting for at least 8 hours.   BUN 20 8 - 23 mg/dL   Creatinine, Ser 2.44 0.44 - 1.00 mg/dL   Calcium 8.3 (L) 8.9 - 10.3 mg/dL   Total Protein 5.2 (L) 6.5 - 8.1 g/dL   Albumin 2.5 (L) 3.5 - 5.0 g/dL   AST 29 15 - 41 U/L   ALT 23 0 -  44 U/L   Alkaline Phosphatase 77 38 - 126 U/L   Total Bilirubin 0.9 0.3 - 1.2 mg/dL   GFR, Estimated 55 (L) >60 mL/min    Comment: (NOTE) Calculated using the CKD-EPI Creatinine Equation (2021)    Anion gap 7 5 - 15    Comment: Performed at [facility], [address] [place], [place] [address]  Troponin I (High Sensitivity)     Status: None   Collection Time: 10/16/23  3:22 PM  Result Value Ref Range   Troponin I (High Sensitivity) 12 <18 ng/L    Comment: (NOTE) Elevated high sensitivity troponin I (hsTnI) values and significant  changes across serial measurements may suggest ACS but many other  chronic and acute conditions are known to elevate hsTnI results.  Refer to the "Links" section for chest pain algorithms and additional  guidance. Performed at [facility], [address] [place], [place] [address]   Glucose, capillary     Status: None   Collection Time: 10/17/23  2:22 AM  Result Value Ref Range   Glucose-Capillary 79 70 - 99 mg/dL    Comment: Glucose reference range applies only to samples taken after fasting for at least 8 hours.  Glucose, capillary     Status: Abnormal   Collection Time: 10/17/23  4:19 AM  Result Value Ref Range   Glucose-Capillary 141 (H) 70 - 99 mg/dL    Comment: Glucose reference range applies only to samples taken after fasting for at least 8 hours.    CT Hip Right Wo Contrast  Result Date: 10/16/2023 CLINICAL DATA:  Hip replacement, periprosthetic fracture suspected EXAM: CT OF THE RIGHT HIP WITHOUT CONTRAST TECHNIQUE: Multidetector CT imaging of the right hip was performed according to the standard protocol. Multiplanar CT image reconstructions were also generated. RADIATION DOSE  REDUCTION: This exam was performed according to the departmental dose-optimization program which includes automated exposure control, adjustment of the mA and/or kV according to patient size and/or use of iterative reconstruction technique. COMPARISON:  Plain films today FINDINGS: Bones/Joint/Cartilage Prior right hip replacement. There is lucency through the anterior and posterior cortex adjacent to the shaft component compatible with periprosthetic nondisplaced fracture. No subluxation or dislocation. Ligaments Suboptimally assessed by CT. Muscles and Tendons Unremarkable Soft tissues Soft tissue stranding in the lateral subcutaneous soft tissues. IMPRESSION: Nondisplaced periprosthetic fracture adjacent to the right hip replacement shaft component. Electronically Signed   By: Charlett Nose M.D.   On: 10/16/2023 22:12   DG Hip Unilat With Pelvis 2-3 Views Right  Result Date: 10/16/2023 CLINICAL DATA:  Right hip pain. Recent hip surgery. Fall at nursing facility. EXAM: DG HIP (WITH OR WITHOUT PELVIS) 2-3V RIGHT COMPARISON:  Pelvis and right hip radiographs 10/07/2023 FINDINGS: Interval total right hip arthroplasty. Redemonstration of prior total left hip arthroplasty. No perihardware lucency is seen to indicate hardware failure or loosening. Expected right hip postoperative subcutaneous air. Anterior surgical skin staples. Moderate pubic symphysis joint space narrowing, subchondral sclerosis, and peripheral osteophytosis. No acute fracture or dislocation. IMPRESSION: 1. Interval total right hip arthroplasty. No evidence of hardware failure or loosening. 2. No acute fracture or dislocation. Electronically Signed   By: Neita Garnet M.D.   On: 10/16/2023 17:58   DG Chest Port 1 View  Result Date: 10/16/2023 CLINICAL DATA:  Syncope and loss of consciousness EXAM: PORTABLE CHEST 1 VIEW COMPARISON:  10/07/2023 FINDINGS: Sternotomy and CABG. Stable cardiomediastinal silhouette. Aortic atherosclerotic  calcification. Low lung volumes accentuate pulmonary vascularity. No  focal consolidation, pleural effusion, or pneumothorax. No displaced rib fractures. IMPRESSION: No acute cardiopulmonary disease. Electronically Signed   By: Minerva Fester M.D.   On: 10/16/2023 15:25     Vitals Temp:  [97.7 F (36.5 C)-98.8 F (37.1 C)] 97.8 F (36.6 C) (10/18 0414) Pulse Rate:  [58-74] 70 (10/18 0414) Resp:  [10-18] 18 (10/18 0414) BP: (85-148)/(41-94) 141/55 (10/18 0414) SpO2:  [97 %-100 %] 99 % (10/18 0414) Weight:  [85.3 kg-85.4 kg] 85.4 kg (10/18 0159) Body mass index is 36.77 kg/m.  Physical Exam: Patient resting relatively comfortably in bed.  Somnolent and somewhat confused as to recent events as would be expected having just arrived from spend the night in the ED.  She does report right hip pain with attempts at motion.  Demonstrates good ankle motion and is grossly neurovascular intact in the right lower extremity.  Her postop dressing is clean and dry.  Compartments are soft.  Reports significant pain with any attempts at either active or passive motion of the right hip.  Imaging studies:  Recent CT scan of the right hip demonstrates unicortical fracture of the proximal femur.     Assessment/Plan: Impression: Status post right total hip arthroplasty for the treatment of right hip fracture now with unicortical periprosthetic femur fracture Treatment: I have counseled Ms. Schepp regarding these recent events.  I have contacted Dr. Linna Caprice who will review imaging studies and outline definitive treatment.  At this time he is hopeful that this can be treated nonoperatively with touchdown weightbearing.  Patient may resume regular diet.  Further follow-up per Dr. Linna Caprice and his team later today.  Arijana Narayan M Miquan Tandon 10/17/2023, 7:17 AM  Contact # [phone]

## 2023-10-17 NOTE — ED Notes (Signed)
ED TO INPATIENT HANDOFF REPORT  ED Nurse Name and Phone #: Theadora Rama RN [phone]  S Name/Age/Gender [name] 89 y.o. female Room/Bed: 010C/010C  Code Status   Code Status: Full Code  Home/SNF/Other Rehab Patient oriented to: self, place, time, and situation Is this baseline? Yes   Triage Complete: Triage complete  Chief Complaint Periprosthetic fracture around internal prosthetic right hip joint (HCC) [F62.01XA]  Triage Note Pt to ED via [facility] from [facility], rehab for recent hip fracture sx. . Pt had a witnessed syncopal episode .  No fall, Did not hit head. Pt was actually working with Physical therapy, and felt dizzy prior to episode. Orientation now at baseline. Pt was hypotensive on EMS arrival, initial bp 102 systolic.  Pt takes atenolol. No blood thinner,  #18 L forearm fluid bolus given by EMS.   106/50, HR 60, 106cbg, 94% 3L, home o2 user.    Allergies Allergies  Allergen Reactions   Codeine Nausea And Vomiting   Penicillins Swelling and Other (See Comments)    Swelling of the tongue   Acetaminophen-Codeine Other (See Comments)    Upset the stomach   Atenolol Other (See Comments)    Reaction unconfirmed- might have just be an ineffective med   Atorvastatin Other (See Comments)    Muscle soreness   Chloramphenicol Other (See Comments)    Reaction unconfirmed   Dapagliflozin Other (See Comments)     yeast infections   Empagliflozin Other (See Comments)    Yeast infections   Liraglutide Nausea Only and Other (See Comments)    Constipation, bloating also   Metformin Hcl Nausea Only   Metoprolol Other (See Comments)    Reaction unconfirmed   Zetia [Ezetimibe] Other (See Comments)    Reaction unconfirmed- might have just be an ineffective med   Sertraline Palpitations    Level of Care/Admitting Diagnosis ED Disposition     ED Disposition  Admit   Condition  --   Comment  Hospital Area: [facility] [[id number]]  Level  of Care: Telemetry Medical [104]  May admit patient to [facility] or [facility] if equivalent level of care is available:: No  Covid Evaluation: Asymptomatic - no recent exposure (last 10 days) testing not required  Diagnosis: Periprosthetic fracture around internal prosthetic right hip joint [facility]) [[id number]]  Admitting Physician: Angie Fava [[id number]]  Attending Physician: Angie Fava [[id number]]  Certification:: I certify this patient will need inpatient services for at least 2 midnights  Expected Medical Readiness: 10/18/2023          B Medical/Surgery History Past Medical History:  Diagnosis Date   Anginal pain (HCC)    "saw doctor-think it was esophagus spasm"   Arthritis    Atrial flutter (HCC) 11/02/2013   Cataract    left eye   Coronary artery disease    Diabetes mellitus without complication (HCC)    type 2   Essential hypertension, benign 11/02/2013   H/O jaundice    as child   Headache 2015   complicated migraine x2    Hearing trouble    Heart disease    Hepatitis    hx of hepatitis A as child   High cholesterol    under control   Long term (current) use of anticoagulants 11/02/2013   Pradaxa    Measles    as child   Mouth dryness    Mumps    as teenager   Numbness and tingling  lips   Osteoporosis    "unsure-maybe in knees"   Psoriasis    Psoriatic arthritis (HCC)    Stroke (HCC) 2012   Vertigo    being monitored, occasional   Past Surgical History:  Procedure Laterality Date   APPENDECTOMY  as child   CARDIAC CATHETERIZATION  ~2006   clear   CORONARY ARTERY BYPASS GRAFT  02/16/2005   LUMBAR DISC SURGERY  09/2004   L1-L5   TONSILLECTOMY  as child   with adenoids   TOTAL HIP ARTHROPLASTY Left 04/19/2019   Procedure: TOTAL HIP ARTHROPLASTY ANTERIOR APPROACH;  Surgeon: Ollen Gross, MD;  Location: [facility];  Service: Orthopedics;  Laterality: Left;   TOTAL HIP ARTHROPLASTY Right 10/09/2023   Procedure: TOTAL HIP ARTHROPLASTY  ANTERIOR APPROACH;  Surgeon: Samson Frederic, MD;  Location: [facility];  Service: Orthopedics;  Laterality: Right;   TOTAL KNEE ARTHROPLASTY Left 09/11/2015   Procedure: LEFT TOTAL KNEE ARTHROPLASTY;  Surgeon: Ollen Gross, MD;  Location: [facility];  Service: Orthopedics;  Laterality: Left;     A IV Location/Drains/Wounds Patient Lines/Drains/Airways Status     Active Line/Drains/Airways     Name Placement date Placement time Site Days   Peripheral IV 10/16/23 18 G Left Forearm 10/16/23  1242  Forearm  1   Wound / Incision (Open or Dehisced) 10/09/23 Irritant Dermatitis (Moisture Associated Skin Damage) Abdomen Left;Lower;Medial 3cm x 1cm masd under pannus 10/09/23  0600  Abdomen  8            Intake/Output Last 24 hours No intake or output data in the 24 hours ending 10/17/23 0031  Labs/Imaging Results for orders placed or performed during the hospital encounter of 10/16/23 (from the past 48 hour(s))  CBC with Differential/Platelet     Status: Abnormal   Collection Time: 10/16/23 12:51 PM  Result Value Ref Range   WBC 9.3 4.0 - 10.5 K/uL   RBC 3.14 (L) 3.87 - 5.11 MIL/uL   Hemoglobin 9.6 (L) 12.0 - 15.0 g/dL   HCT 16.1 (L) 09.6 - 04.5 %   MCV 93.6 80.0 - 100.0 fL   MCH 30.6 26.0 - 34.0 pg   MCHC 32.7 30.0 - 36.0 g/dL   RDW 40.9 81.1 - 91.4 %   Platelets 193 150 - 400 K/uL   nRBC 0.0 0.0 - 0.2 %   Neutrophils Relative % 62 %   Neutro Abs 5.7 1.7 - 7.7 K/uL   Lymphocytes Relative 22 %   Lymphs Abs 2.1 0.7 - 4.0 K/uL   Monocytes Relative 10 %   Monocytes Absolute 0.9 0.1 - 1.0 K/uL   Eosinophils Relative 4 %   Eosinophils Absolute 0.4 0.0 - 0.5 K/uL   Basophils Relative 0 %   Basophils Absolute 0.0 0.0 - 0.1 K/uL   Immature Granulocytes 2 %   Abs Immature Granulocytes 0.21 (H) 0.00 - 0.07 K/uL    Comment: Performed at [facility], [address], [place], [place] [address]  Troponin I (High Sensitivity)     Status: None   Collection Time: 10/16/23 12:51 PM  Result  Value Ref Range   Troponin I (High Sensitivity) 9 <18 ng/L    Comment: (NOTE) Elevated high sensitivity troponin I (hsTnI) values and significant  changes across serial measurements may suggest ACS but many other  chronic and acute conditions are known to elevate hsTnI results.  Refer to the Links section for chest pain algorithms and additional  guidance. Performed at [facility], [address] [place], [place] [address]   Comprehensive metabolic panel     Status: Abnormal   Collection Time: 10/16/23  2:07 PM  Result Value Ref Range   Sodium 138 135 - 145 mmol/L   Potassium 4.5 3.5 - 5.1 mmol/L   Chloride 108 98 - 111 mmol/L   CO2 23 22 - 32 mmol/L   Glucose, Bld 136 (H) 70 - 99 mg/dL    Comment: Glucose reference range applies only to samples taken after fasting for at least 8 hours.   BUN 20 8 - 23 mg/dL   Creatinine, Ser 2.35 0.44 - 1.00 mg/dL   Calcium 8.3 (L) 8.9 - 10.3 mg/dL   Total Protein 5.2 (L) 6.5 - 8.1 g/dL   Albumin 2.5 (L) 3.5 - 5.0 g/dL   AST 29 15 - 41 U/L   ALT 23 0 - 44 U/L   Alkaline Phosphatase 77 38 - 126 U/L   Total Bilirubin 0.9 0.3 - 1.2 mg/dL   GFR, Estimated 55 (L) >60 mL/min    Comment: (NOTE) Calculated using the CKD-EPI Creatinine Equation (2021)    Anion gap 7 5 - 15    Comment: Performed at [facility], [address] [place], [place] [address]  Troponin I (High Sensitivity)     Status: None   Collection Time: 10/16/23  3:22 PM  Result Value Ref Range   Troponin I (High Sensitivity) 12 <18 ng/L    Comment: (NOTE) Elevated high sensitivity troponin I (hsTnI) values and significant  changes across serial measurements may suggest ACS but many other  chronic and acute conditions are known to elevate hsTnI results.  Refer to the "Links" section for chest pain algorithms and additional  guidance. Performed at [facility], [address] [place], [place] [address]    CT Hip Right Wo Contrast  Result Date:  10/16/2023 CLINICAL DATA:  Hip replacement, periprosthetic fracture suspected EXAM: CT OF THE RIGHT HIP WITHOUT CONTRAST TECHNIQUE: Multidetector CT imaging of the right hip was performed according to the standard protocol. Multiplanar CT image reconstructions were also generated. RADIATION DOSE REDUCTION: This exam was performed according to the departmental dose-optimization program which includes automated exposure control, adjustment of the mA and/or kV according to patient size and/or use of iterative reconstruction technique. COMPARISON:  Plain films today FINDINGS: Bones/Joint/Cartilage Prior right hip replacement. There is lucency through the anterior and posterior cortex adjacent to the shaft component compatible with periprosthetic nondisplaced fracture. No subluxation or dislocation. Ligaments Suboptimally assessed by CT. Muscles and Tendons Unremarkable Soft tissues Soft tissue stranding in the lateral subcutaneous soft tissues. IMPRESSION: Nondisplaced periprosthetic fracture adjacent to the right hip replacement shaft component. Electronically Signed   By: Charlett Nose M.D.   On: 10/16/2023 22:12   DG Hip Unilat With Pelvis 2-3 Views Right  Result Date: 10/16/2023 CLINICAL DATA:  Right hip pain. Recent hip surgery. Fall at nursing facility. EXAM: DG HIP (WITH OR WITHOUT PELVIS) 2-3V RIGHT COMPARISON:  Pelvis and right hip radiographs 10/07/2023 FINDINGS: Interval total right hip arthroplasty. Redemonstration of prior total left hip arthroplasty. No perihardware lucency is seen to indicate hardware failure or loosening. Expected right hip postoperative subcutaneous air. Anterior surgical skin staples. Moderate pubic symphysis joint space narrowing, subchondral sclerosis, and peripheral osteophytosis. No acute fracture or dislocation. IMPRESSION: 1. Interval total right hip arthroplasty. No evidence of hardware failure or loosening. 2. No acute fracture or dislocation. Electronically Signed   By:  Neita Garnet  M.D.   On: 10/16/2023 17:58   DG Chest Port 1 View  Result Date: 10/16/2023 CLINICAL DATA:  Syncope and loss of consciousness EXAM: PORTABLE CHEST 1 VIEW COMPARISON:  10/07/2023 FINDINGS: Sternotomy and CABG. Stable cardiomediastinal silhouette. Aortic atherosclerotic calcification. Low lung volumes accentuate pulmonary vascularity. No focal consolidation, pleural effusion, or pneumothorax. No displaced rib fractures. IMPRESSION: No acute cardiopulmonary disease. Electronically Signed   By: Minerva Fester M.D.   On: 10/16/2023 15:25    Pending Labs Unresulted Labs (From admission, onward)     Start     Ordered   10/17/23 0500  CBC with Differential/Platelet  Tomorrow morning,   R        10/16/23 2321   10/17/23 0500  Comprehensive metabolic panel  Tomorrow morning,   R        10/16/23 2321   10/17/23 0500  Magnesium  Tomorrow morning,   R        10/16/23 2321   10/16/23 2322  Magnesium  Add-on,   AD        10/16/23 2321            Vitals/Pain Today's Vitals   10/16/23 1945 10/16/23 2000 10/16/23 2030 10/17/23 0000  BP: (!) 148/49 (!) 110/52  131/85  Pulse: 71 66  70  Resp: 18 15  16   Temp:   98.8 F (37.1 C)   TempSrc:   Oral   SpO2: 98% 97%  99%  Weight:      Height:      PainSc:        Isolation Precautions No active isolations  Medications Medications  acetaminophen (TYLENOL) tablet 650 mg (has no administration in time range)    Or  acetaminophen (TYLENOL) suppository 650 mg (has no administration in time range)  melatonin tablet 3 mg (has no administration in time range)  ondansetron (ZOFRAN) injection 4 mg (has no administration in time range)  naloxone (NARCAN) injection 0.4 mg (has no administration in time range)  fentaNYL (SUBLIMAZE) injection 25 mcg (has no administration in time range)  acetaminophen (TYLENOL) tablet 1,000 mg (1,000 mg Oral Given 10/16/23 1808)  traMADol (ULTRAM) tablet 50 mg (50 mg Oral Given 10/16/23 1808)     Mobility non-ambulatory     Focused Assessments Neuro Assessment Handoff:  Swallow screen pass? Yes  Cardiac Rhythm: Sinus bradycardia       Neuro Assessment:   Neuro Checks:      Has TPA been given? No If patient is a Neuro Trauma and patient is going to OR before floor call report to 4N Charge nurse: [phone] or [phone]   R Recommendations: See Admitting Provider Note  Report given to:   Additional Notes:  Additional Notes: Pt to ED via [facility] from [facility], rehab for recent hip fracture sx. . Pt had a witnessed syncopal episode .  No fall, Did not hit head. Pt was actually working with Physical therapy, and felt dizzy prior to episode. Orientation now at baseline. Pt was hypotensive on EMS arrival, initial bp 102 systolic.   Pt takes atenolol. No blood thinner,   #18 L forearm fluid bolus given by EMS.    106/50, HR 60, 106cbg, 94% 3L, home o2 user.

## 2023-10-17 NOTE — Plan of Care (Signed)
  Problem: Education: Goal: Knowledge of General Education information will improve Description: Including pain rating scale, medication(s)/side effects and non-pharmacologic comfort measures Outcome: Progressing   Problem: Clinical Measurements: Goal: Will remain free from infection Outcome: Progressing   Problem: Skin Integrity: Goal: Risk for impaired skin integrity will decrease Outcome: Progressing   

## 2023-10-17 NOTE — Progress Notes (Addendum)
PROGRESS NOTE  [name] QMV:[id number] DOB: 11/21/1934   PCP: Emilio Aspen, MD  Patient is from: SNF, [facility]  DOA: 10/16/2023 LOS: 1  Chief complaints Chief Complaint  Patient presents with   Loss of Consciousness     Brief Narrative / Interim history: 89 year old F with PMH of recent right THA, paroxysmal flutter on Pradaxa, DM-2, diastolic CHF, psoriatic arthritis, HTN, HLD, anemia and morbid obesity brought to ED due to right hip pain after syncopal episode while using bathroom, and admitted for right periprosthetic right hip fracture and vasovagal syncope.  Patient had brief LOC but brought down to the floor by family members.  CT right hip showed evidence of nondisplaced periprosthetic fracture adjacent to right hip replacement shaft component.  Orthopedic surgery consulted and recommended hospitalist admission.   Subjective: Seen and examined earlier this morning.  No major events overnight of this morning.  No major complaints other than feeling sore in her right hip.  She denies chest pain, dyspnea, GI or UTI symptoms.  She reports poor appetite.  Objective: Vitals:   10/17/23 0100 10/17/23 0159 10/17/23 0414 10/17/23 0738  BP: (!) 137/47 (!) 148/55 (!) 141/55 (!) 133/48  Pulse: 66 74 70 66  Resp: 14  18   Temp:  98.1 F (36.7 C) 97.8 F (36.6 C) 98.1 F (36.7 C)  TempSrc:  Oral Oral Oral  SpO2: 97% 99% 99% 96%  Weight:  85.4 kg    Height:        Examination:  GENERAL: No apparent distress.  Nontoxic. HEENT: MMM.  Vision and hearing grossly intact.  NECK: Supple.  No apparent JVD.  RESP:  No IWOB.  Fair aeration bilaterally. CVS:  RRR. Heart sounds normal.  ABD/GI/GU: BS+. Abd soft, NTND.  MSK/EXT:  Moves extremities.  Dressing over right anterior thigh DCI.  No bruising or hematoma. SKIN: As above NEURO: Awake, alert and oriented appropriately.  No apparent focal neuro deficit. PSYCH: Calm. Normal affect.   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  Presents with right hip pain after syncopal episode.  No trauma or fall.  CT suggests nondisplaced periprosthetic fracture. -[facility] recommends nonoperative management with touchdown weightbearing -Pain control and bowel regimen -PT/OT eval  Vasovagal syncope: Had brief syncope while using bathroom.  She has brief LOC but brought down to floor before she fell.  EKG sinus rhythm with borderline QTc.  TTE in 2023 without significant finding other than G1 DD. -Continue telemetry monitoring  Subacute postoperative anemia: No report of overt bleeding.  H&H stable. Recent Labs    10/07/23 1917 10/08/23 0356 10/09/23 0355 10/10/23 0329 10/11/23 0327 10/12/23 0338 10/16/23 1251 10/17/23 0758  HGB 12.7 12.1 11.1* 10.7* 10.4* 9.9* 9.6* 9.5*  -Check anemia panel -Monitor   Paroxysmal atrial flutter: Currently in sinus rhythm.  CHA2DS2-VASc score about 6.  On Coreg and Pradaxa at home. -Continue home Pradaxa.  Resume home Coreg at reduced dose. -Optimize electrolytes  IDDM-2 with hyperglycemia and hyperlipidemia: A1c 7.3% on 10/9. Recent Labs  Lab 10/13/23 1150 10/17/23 0222 10/17/23 0419 10/17/23 0717 10/17/23 1204  GLUCAP 182* 79 141* 119* 139*  -Continue current insulin regimen -Continue statin   Chronic diastolic CHF: Appears euvolemic on exam but difficult due to body habitus.  TTE in 2023 without significant finding.  Not on diuretics other than low-dose Aldactone. -Hold Aldactone. -Monitor fluid and  respiratory status  Essential hypertension: Normotensive for most part. -Hold home enalapril and Aldactone. -Resume home Coreg at reduced dose  Psoriatic arthritis-seems like she is on Enbrel.   Morbid obesity Body mass index is 36.77 kg/m.          DVT prophylaxis:  SCDs Start: 10/16/23 2321 dabigatran (PRADAXA) capsule 150 mg  Code Status: Full code Family Communication: None at bedside. Level of care: Telemetry Medical Status is: Inpatient Remains inpatient appropriate because: Right hip periprosthetic fracture   Final disposition: TBD Consultants:  Orthopedic surgery  55 minutes with more than 50% spent in reviewing records, counseling patient/family and coordinating care.   Sch Meds:  Scheduled Meds:  carvedilol  3.125 mg Oral BID   dabigatran  150 mg Oral BID   docusate sodium  100 mg Oral BID   insulin aspart  0-9 Units Subcutaneous Q6H   insulin glargine-yfgn  12 Units Subcutaneous BID   rosuvastatin  10 mg Oral QHS   Continuous Infusions: PRN Meds:.acetaminophen **OR** acetaminophen, clonazePAM, fentaNYL (SUBLIMAZE) injection, melatonin, naLOXone (NARCAN)  injection, ondansetron (ZOFRAN) IV, polyethylene glycol  Antimicrobials: Anti-infectives (From admission, onward)    None        I have personally reviewed the following labs and images: CBC: Recent Labs  Lab 10/11/23 0327 10/12/23 0338 10/16/23 1251 10/17/23 0758  WBC 10.3 8.7 9.3 9.4  NEUTROABS  --   --  5.7 5.4  HGB 10.4* 9.9* 9.6* 9.5*  HCT 32.4* 31.2* 29.4* 29.1*  MCV 95.0 94.3 93.6 89.8  PLT 139* 155 193 236   BMP &GFR Recent Labs  Lab 10/11/23 0327 10/12/23 0338 10/16/23 1407 10/17/23 0758  NA 135 134* 138 137  K 4.4 4.6 4.5 3.9  CL 104 103 108 103  CO2 21* 24 23 24   GLUCOSE 173* 160* 136* 109*  BUN 47* 33* 20 18  CREATININE 1.05* 0.90 0.99 0.87  CALCIUM 8.9 8.7* 8.3* 8.5*  MG  --   --   --  2.1   Estimated Creatinine Clearance: 44.2 mL/min (by C-G formula based  on SCr of 0.87 mg/dL). Liver & Pancreas: Recent Labs  Lab 10/16/23 1407 10/17/23 0758  AST 29 22  ALT 23 23  ALKPHOS 77 76  BILITOT 0.9 0.7  PROT 5.2* 5.6*  ALBUMIN 2.5* 2.6*   No results for input(s): "LIPASE", "AMYLASE" in the last 168 hours. No results for input(s): "AMMONIA" in the last 168 hours. Diabetic: No results for input(s): "HGBA1C" in the last 72 hours. Recent Labs  Lab 10/13/23 1150 10/17/23 0222 10/17/23 0419 10/17/23 0717 10/17/23 1204  GLUCAP 182* 79 141* 119* 139*   Cardiac Enzymes: No results for input(s): "CKTOTAL", "CKMB", "CKMBINDEX", "TROPONINI" in the last 168 hours. No results for input(s): "PROBNP" in the last 8760 hours. Coagulation Profile: Recent Labs  Lab 10/17/23 0758  INR 1.3*   Thyroid Function Tests: No results for input(s): "TSH", "T4TOTAL", "FREET4", "T3FREE", "THYROIDAB" in the last 72 hours. Lipid Profile: No results for input(s): "CHOL", "HDL", "LDLCALC", "TRIG", "CHOLHDL", "LDLDIRECT" in the last 72 hours. Anemia Panel: Recent Labs    10/17/23 0758  VITAMINB12 1,985*  FOLATE 14.4  FERRITIN 72  TIBC 258  IRON 41  RETICCTPCT  3.3*   Urine analysis:    Component Value Date/Time   COLORURINE YELLOW 04/18/2019 0520   APPEARANCEUR CLEAR 04/18/2019 0520   LABSPEC 1.020 04/18/2019 0520   PHURINE 5.0 04/18/2019 0520   GLUCOSEU NEGATIVE 04/18/2019 0520   HGBUR TRACE (A) 04/18/2019 0520   BILIRUBINUR NEGATIVE 04/18/2019 0520   KETONESUR TRACE (A) 04/18/2019 0520   PROTEINUR NEGATIVE 04/18/2019 0520   UROBILINOGEN 0.2 09/05/2015 1531   NITRITE NEGATIVE 04/18/2019 0520   LEUKOCYTESUR NEGATIVE 04/18/2019 0520   Sepsis Labs: Invalid input(s): "PROCALCITONIN", "LACTICIDVEN"  Microbiology: Recent Results (from the past 240 hour(s))  Surgical PCR screen     Status: None   Collection Time: 10/08/23  6:17 PM   Specimen: Nasal Mucosa; Nasal Swab  Result Value Ref Range Status   MRSA, PCR NEGATIVE NEGATIVE Final    Staphylococcus aureus NEGATIVE NEGATIVE Final    Comment: (NOTE) The Xpert SA Assay (FDA approved for NASAL specimens in patients 70 years of age and older), is one component of a comprehensive surveillance program. It is not intended to diagnose infection nor to guide or monitor treatment. Performed at [facility], [address] [place], [place] [address]     Radiology Studies: CT Hip Right Wo Contrast  Result Date: 10/16/2023 CLINICAL DATA:  Hip replacement, periprosthetic fracture suspected EXAM: CT OF THE RIGHT HIP WITHOUT CONTRAST TECHNIQUE: Multidetector CT imaging of the right hip was performed according to the standard protocol. Multiplanar CT image reconstructions were also generated. RADIATION DOSE REDUCTION: This exam was performed according to the departmental dose-optimization program which includes automated exposure control, adjustment of the mA and/or kV according to patient size and/or use of iterative reconstruction technique. COMPARISON:  Plain films today FINDINGS: Bones/Joint/Cartilage Prior right hip replacement. There is lucency through the anterior and posterior cortex adjacent to the shaft component compatible with periprosthetic nondisplaced fracture. No subluxation or dislocation. Ligaments Suboptimally assessed by CT. Muscles and Tendons Unremarkable Soft tissues Soft tissue stranding in the lateral subcutaneous soft tissues. IMPRESSION: Nondisplaced periprosthetic fracture adjacent to the right hip replacement shaft component. Electronically Signed   By: Charlett Nose M.D.   On: 10/16/2023 22:12   DG Hip Unilat With Pelvis 2-3 Views Right  Result Date: 10/16/2023 CLINICAL DATA:  Right hip pain. Recent hip surgery. Fall at nursing facility. EXAM: DG HIP (WITH OR WITHOUT PELVIS) 2-3V RIGHT COMPARISON:  Pelvis and right hip radiographs 10/07/2023 FINDINGS: Interval total right hip arthroplasty. Redemonstration of prior total left hip arthroplasty. No  perihardware lucency is seen to indicate hardware failure or loosening. Expected right hip postoperative subcutaneous air. Anterior surgical skin staples. Moderate pubic symphysis joint space narrowing, subchondral sclerosis, and peripheral osteophytosis. No acute fracture or dislocation. IMPRESSION: 1. Interval total right hip arthroplasty. No evidence of hardware failure or loosening. 2. No acute fracture or dislocation. Electronically Signed   By: Neita Garnet M.D.   On: 10/16/2023 17:58   DG Chest Port 1 View  Result Date: 10/16/2023 CLINICAL DATA:  Syncope and loss of consciousness EXAM: PORTABLE CHEST 1 VIEW COMPARISON:  10/07/2023 FINDINGS: Sternotomy and CABG. Stable cardiomediastinal silhouette. Aortic atherosclerotic calcification. Low lung volumes accentuate pulmonary vascularity. No focal consolidation, pleural effusion, or pneumothorax. No displaced rib fractures. IMPRESSION: No acute cardiopulmonary disease. Electronically Signed   By: Minerva Fester M.D.   On: 10/16/2023 15:25      Alyne Martinson T. [facility]  If 7PM-7AM, please contact night-coverage [email or web address] 10/17/2023, 12:44 PM

## 2023-10-17 NOTE — Plan of Care (Signed)
[facility] Tonsillectomy/Adenoidectomy, Postoperative PEDS care plan entered in error.

## 2023-10-17 NOTE — ED Notes (Signed)
Transport requested

## 2023-10-18 DIAGNOSIS — D649 Anemia, unspecified: Secondary | ICD-10-CM | POA: Diagnosis not present

## 2023-10-18 DIAGNOSIS — M9701XA Periprosthetic fracture around internal prosthetic right hip joint, initial encounter: Secondary | ICD-10-CM | POA: Diagnosis not present

## 2023-10-18 DIAGNOSIS — I5032 Chronic diastolic (congestive) heart failure: Secondary | ICD-10-CM | POA: Diagnosis not present

## 2023-10-18 DIAGNOSIS — R55 Syncope and collapse: Secondary | ICD-10-CM | POA: Diagnosis not present

## 2023-10-18 LAB — MAGNESIUM: Magnesium: 2 mg/dL (ref 1.7–2.4)

## 2023-10-18 LAB — RENAL FUNCTION PANEL
Albumin: 2.4 g/dL — ABNORMAL LOW (ref 3.5–5.0)
Anion gap: 10 (ref 5–15)
BUN: 17 mg/dL (ref 8–23)
CO2: 23 mmol/L (ref 22–32)
Calcium: 8.6 mg/dL — ABNORMAL LOW (ref 8.9–10.3)
Chloride: 104 mmol/L (ref 98–111)
Creatinine, Ser: 0.97 mg/dL (ref 0.44–1.00)
GFR, Estimated: 57 mL/min — ABNORMAL LOW (ref >60)
Glucose, Bld: 201 mg/dL — ABNORMAL HIGH (ref 70–99)
Phosphorus: 3.2 mg/dL (ref 2.5–4.6)
Potassium: 4 mmol/L (ref 3.5–5.1)
Sodium: 137 mmol/L (ref 135–145)

## 2023-10-18 LAB — GLUCOSE, CAPILLARY
Glucose-Capillary: 171 mg/dL — ABNORMAL HIGH (ref 70–99)
Glucose-Capillary: 216 mg/dL — ABNORMAL HIGH (ref 70–99)
Glucose-Capillary: 247 mg/dL — ABNORMAL HIGH (ref 70–99)
Glucose-Capillary: 226 mg/dL — ABNORMAL HIGH (ref 70–99)
Glucose-Capillary: 212 mg/dL — ABNORMAL HIGH (ref 70–99)

## 2023-10-18 MED ORDER — TRAMADOL HCL 50 MG PO TABS
50.0000 mg | ORAL_TABLET | Freq: Four times a day (QID) | ORAL | Status: DC | PRN
  Administered 2023-10-18 – 2023-10-20 (×5): 50 mg via ORAL
  Filled 2023-10-18 (×5): qty 1

## 2023-10-18 MED ORDER — INSULIN ASPART 100 UNIT/ML IJ SOLN: 3.0000 [IU] | Freq: Three times a day (TID) | INTRAMUSCULAR | Status: DC

## 2023-10-18 MED ORDER — SENNOSIDES-DOCUSATE SODIUM 8.6-50 MG PO TABS: 1.0000 | ORAL_TABLET | Freq: Two times a day (BID) | ORAL | Status: AC | PRN

## 2023-10-18 MED ORDER — SENNOSIDES-DOCUSATE SODIUM 8.6-50 MG PO TABS
1.0000 | ORAL_TABLET | Freq: Every day | ORAL | Status: DC
  Administered 2023-10-18 – 2023-10-20 (×3): 1 via ORAL
  Filled 2023-10-18 (×3): qty 1

## 2023-10-18 MED ORDER — ACETAMINOPHEN 325 MG PO TABS: 650.0000 mg | ORAL_TABLET | Freq: Four times a day (QID) | ORAL | Status: AC | PRN

## 2023-10-18 MED ORDER — INSULIN ASPART 100 UNIT/ML IJ SOLN
0.0000 [IU] | Freq: Three times a day (TID) | INTRAMUSCULAR | Status: DC
  Administered 2023-10-18 – 2023-10-19 (×2): 3 [IU] via SUBCUTANEOUS
  Administered 2023-10-19: 2 [IU] via SUBCUTANEOUS
  Administered 2023-10-19 – 2023-10-20 (×2): 3 [IU] via SUBCUTANEOUS

## 2023-10-18 MED ORDER — INSULIN GLARGINE-YFGN 100 UNIT/ML ~~LOC~~ SOLN
20.0000 [IU] | Freq: Two times a day (BID) | SUBCUTANEOUS | Status: DC
  Administered 2023-10-18 – 2023-10-20 (×4): 20 [IU] via SUBCUTANEOUS
  Filled 2023-10-18 (×5): qty 0.2

## 2023-10-18 MED ORDER — INSULIN ASPART 100 UNIT/ML IJ SOLN
0.0000 [IU] | Freq: Every day | INTRAMUSCULAR | Status: DC
  Administered 2023-10-18: 2 [IU] via SUBCUTANEOUS

## 2023-10-18 MED ORDER — INSULIN ASPART 100 UNIT/ML IJ SOLN: 0.0000 [IU] | Freq: Three times a day (TID) | INTRAMUSCULAR | Status: DC

## 2023-10-18 NOTE — Progress Notes (Signed)
PROGRESS NOTE  [name] [id number]:[id number] DOB: 03/04/1934   PCP: Emilio Aspen, MD  Patient is from: SNF, [facility]  DOA: 10/16/2023 LOS: 2  Chief complaints Chief Complaint  Patient presents with   Loss of Consciousness     Brief Narrative / Interim history: 89 year old F with PMH of recent right THA, paroxysmal flutter on Pradaxa, DM-2, diastolic CHF, psoriatic arthritis, HTN, HLD, anemia and morbid obesity brought to ED due to right hip pain after syncopal episode while using bathroom, and admitted for right periprosthetic right hip fracture and vasovagal syncope.  Patient had brief LOC but brought down to the floor by family members.  CT right hip showed evidence of nondisplaced periprosthetic fracture adjacent to right hip replacement shaft component.  Orthopedic surgery consulted and recommended hospitalist admission.   Patient has been evaluated by orthopedic surgery who recommended touchdown weightbearing on right lower extremity.  Subjective: Seen and examined earlier this morning.  No major events overnight of this morning.  Reports improvement in her pain.  However, she likes to get his tramadol every 6 hours instead of every 12 hours.  She also likes to have regular diet.  Patient's daughter at bedside.  Objective: Vitals:   10/18/23 0403 10/18/23 0500 10/18/23 0739 10/18/23 1517  BP: (!) 133/47  (!) 158/53 (!) 148/48  Pulse: 76  69   Resp: 18  20 17   Temp: 98.4 F (36.9 C)  98.5 F (36.9 C) 98.2 F (36.8 C)  TempSrc: Oral   Oral  SpO2: 97%  96% 99%  Weight:  86.2 kg    Height:        Examination:  GENERAL: No apparent distress.  Nontoxic. HEENT: MMM.  Vision and hearing grossly intact.  NECK: Supple.  No apparent JVD.  RESP:  No IWOB.  Fair aeration bilaterally. CVS:  RRR. Heart sounds normal.  ABD/GI/GU: BS+. Abd soft, NTND.  MSK/EXT:  Moves extremities.  Dressing over right anterior thigh DCI.  No bruising or hematoma. SKIN: As  above NEURO: Awake, alert and oriented appropriately.  No apparent focal neuro deficit. PSYCH: Calm. Normal affect.   Procedures:  None  Microbiology summarized: None  Assessment and plan: Principal Problem:   Periprosthetic fracture around internal prosthetic right hip joint (HCC) Active Problems:   Essential hypertension   DM2 (diabetes mellitus, type 2) (HCC)   Syncope, vasovagal   Postoperative anemia   Paroxysmal atrial flutter (HCC)   Hyperlipidemia   Chronic diastolic CHF (congestive heart failure) (HCC)  Right periprosthetic right hip fracture: Patient with recent right THA.  Presents with right hip pain after syncopal episode.  No trauma or fall.  CT suggests nondisplaced periprosthetic fracture. -[facility] recommends nonoperative management with touchdown weightbearing -Increase tramadol to every 6 hours as needed -Discontinue oxycodone.  History of intolerance.  -PT/OT eval -Likely return to SNF either today or tomorrow  Vasovagal syncope: Had brief syncope while using bathroom.  She has brief LOC but brought down to floor before she fell.  EKG sinus rhythm with borderline QTc.  TTE in 2023 without significant finding other than G1-DD. -Continue telemetry monitoring  Subacute postoperative anemia: No report of overt bleeding.  Anemia panel without significant iron deficiency.  H&H stable. Recent Labs    10/07/23 1917 10/08/23 0356 10/09/23 0355 10/10/23 0329 10/11/23 0327 10/12/23 0338 10/16/23 1251 10/17/23 0758  HGB 12.7 12.1 11.1* 10.7* 10.4* 9.9* 9.6* 9.5*  -Monitor   Paroxysmal atrial flutter: Currently in sinus rhythm.  CHA2DS2-VASc score about  6.  On Coreg and Pradaxa at home. -Continue home Pradaxa.  Resume home Coreg at reduced dose. -Optimize electrolytes  IDDM-2 with hyperglycemia and hyperlipidemia: A1c 7.3% on 10/9. Recent Labs  Lab 10/17/23 2351 10/18/23 0401 10/18/23 0841 10/18/23 1323 10/18/23 1747  GLUCAP 116* 171* 216* 247*  226*  -Increase SSI to moderate -Add night coverage. -Increase Semglee from 12 to 20 units twice daily starting tonight -Continue statin -Liberated diet to regular   Chronic diastolic CHF: Appears euvolemic on exam but difficult due to body habitus.  TTE in 2023 without significant finding.  Not on diuretics other than low-dose Aldactone. -Hold Aldactone. -Monitor fluid and respiratory status  Essential hypertension: Normotensive for most part. -Hold home enalapril and Aldactone. -Resume home Coreg at reduced dose  Psoriatic arthritis-seems like she is on Enbrel.   Morbid obesity Body mass index is 37.11 kg/m.          DVT prophylaxis:  SCDs Start: 10/16/23 2321 dabigatran (PRADAXA) capsule 150 mg  Code Status: Full code Family Communication: Updated patient's daughter at bedside, and patient's son over the phone. Level of care: Telemetry Medical Status is: Inpatient Remains inpatient appropriate because: Right hip periprosthetic fracture   Final disposition: TBD Consultants:  Orthopedic surgery  35 minutes with more than 50% spent in reviewing records, counseling patient/family and coordinating care.   Sch Meds:  Scheduled Meds:  acetaminophen  1,000 mg Oral Q8H   carvedilol  3.125 mg Oral BID   dabigatran  150 mg Oral BID   insulin aspart  0-9 Units Subcutaneous Q6H   insulin glargine-yfgn  12 Units Subcutaneous BID   rosuvastatin  10 mg Oral QHS   senna-docusate  1 tablet Oral Daily   Continuous Infusions: PRN Meds:.clonazePAM, fentaNYL (SUBLIMAZE) injection, melatonin, naLOXone (NARCAN)  injection, ondansetron (ZOFRAN) IV, polyethylene glycol, traMADol  Antimicrobials: Anti-infectives (From admission, onward)    None        I have personally reviewed the following labs and images: CBC: Recent Labs  Lab 10/12/23 0338 10/16/23 1251 10/17/23 0758  WBC 8.7 9.3 9.4  NEUTROABS  --  5.7 5.4  HGB 9.9* 9.6* 9.5*  HCT 31.2* 29.4* 29.1*  MCV 94.3  93.6 89.8  PLT 155 193 236   BMP &GFR Recent Labs  Lab 10/12/23 0338 10/16/23 1407 10/17/23 0758 10/18/23 0451  NA 134* 138 137 137  K 4.6 4.5 3.9 4.0  CL 103 108 103 104  CO2 24 23 24 23   GLUCOSE 160* 136* 109* 201*  BUN 33* 20 18 17   CREATININE 0.90 0.99 0.87 0.97  CALCIUM 8.7* 8.3* 8.5* 8.6*  MG  --   --  2.1 2.0  PHOS  --   --   --  3.2   Estimated Creatinine Clearance: 39.9 mL/min (by C-G formula based on SCr of 0.97 mg/dL). Liver & Pancreas: Recent Labs  Lab 10/16/23 1407 10/17/23 0758 10/18/23 0451  AST 29 22  --   ALT 23 23  --   ALKPHOS 77 76  --   BILITOT 0.9 0.7  --   PROT 5.2* 5.6*  --   ALBUMIN 2.5* 2.6* 2.4*   No results for input(s): "LIPASE", "AMYLASE" in the last 168 hours. No results for input(s): "AMMONIA" in the last 168 hours. Diabetic: No results for input(s): "HGBA1C" in the last 72 hours. Recent Labs  Lab 10/17/23 2351 10/18/23 0401 10/18/23 0841 10/18/23 1323 10/18/23 1747  GLUCAP 116* 171* 216* 247* 226*   Cardiac Enzymes: No results  for input(s): "CKTOTAL", "CKMB", "CKMBINDEX", "TROPONINI" in the last 168 hours. No results for input(s): "PROBNP" in the last 8760 hours. Coagulation Profile: Recent Labs  Lab 10/17/23 0758  INR 1.3*   Thyroid Function Tests: No results for input(s): "TSH", "T4TOTAL", "FREET4", "T3FREE", "THYROIDAB" in the last 72 hours. Lipid Profile: No results for input(s): "CHOL", "HDL", "LDLCALC", "TRIG", "CHOLHDL", "LDLDIRECT" in the last 72 hours. Anemia Panel: Recent Labs    10/17/23 0758  VITAMINB12 1,985*  FOLATE 14.4  FERRITIN 72  TIBC 258  IRON 41  RETICCTPCT 3.3*   Urine analysis:    Component Value Date/Time   COLORURINE YELLOW 04/18/2019 0520   APPEARANCEUR CLEAR 04/18/2019 0520   LABSPEC 1.020 04/18/2019 0520   PHURINE 5.0 04/18/2019 0520   GLUCOSEU NEGATIVE 04/18/2019 0520   HGBUR TRACE (A) 04/18/2019 0520   BILIRUBINUR NEGATIVE 04/18/2019 0520   KETONESUR TRACE (A) 04/18/2019  0520   PROTEINUR NEGATIVE 04/18/2019 0520   UROBILINOGEN 0.2 09/05/2015 1531   NITRITE NEGATIVE 04/18/2019 0520   LEUKOCYTESUR NEGATIVE 04/18/2019 0520   Sepsis Labs: Invalid input(s): "PROCALCITONIN", "LACTICIDVEN"  Microbiology: Recent Results (from the past 240 hour(s))  Surgical PCR screen     Status: None   Collection Time: 10/08/23  6:17 PM   Specimen: Nasal Mucosa; Nasal Swab  Result Value Ref Range Status   MRSA, PCR NEGATIVE NEGATIVE Final   Staphylococcus aureus NEGATIVE NEGATIVE Final    Comment: (NOTE) The Xpert SA Assay (FDA approved for NASAL specimens in patients 25 years of age and older), is one component of a comprehensive surveillance program. It is not intended to diagnose infection nor to guide or monitor treatment. Performed at [facility], [address] [place], [place] [address]     Radiology Studies: No results found.    Maysoon Lozada T. Danajah Birdsell [facility]  If 7PM-7AM, please contact night-coverage [email or web address] 10/18/2023, 5:49 PM

## 2023-10-18 NOTE — Evaluation (Signed)
Occupational Therapy Evaluation Patient Details Name: [name] MRN: [id number] DOB: 1934/12/09 Today's Date: 10/18/2023   History of Present Illness Patient is a 89 year old female who presents with new R hip fx 10/16/23. Pt recently  on 10/09/23 had fall with R hip fx and THR by anterior approach.  Pt with hx of CAD, CVA (2012), CABG, L THR and L TKR.   Clinical Impression   Pt s/p above diagnosis. Pt c/o pain at rest, limited participation, did not want to perform OOB activity as she had just gotten up with nursing to [facility] Pt from [facility], hopeful to return. Pt able to perform bed level ADLs, good BUE strength/ROM, per nursing Pt able to transfer and perform bed mobility with min A, not able to take steps due to TTWB status to RLE. Pt would benefit from postacute care <3hrs/day to improve to functional level prior to returning home, will be seen acutely.      If plan is discharge home, recommend the following: A lot of help with walking and/or transfers;A lot of help with bathing/dressing/bathroom;Assistance with cooking/housework;Assist for transportation;Help with stairs or ramp for entrance    Functional Status Assessment  Patient has had a recent decline in their functional status and demonstrates the ability to make significant improvements in function in a reasonable and predictable amount of time.  Equipment Recommendations  None recommended by OT    Recommendations for Other Services       Precautions / Restrictions Precautions Precautions: Fall Restrictions Weight Bearing Restrictions: Yes RLE Weight Bearing: Touchdown weight bearing      Mobility Bed Mobility Overal bed mobility: Needs Assistance             General bed mobility comments: Pt refused, per RN min A    Transfers Overall transfer level: Needs assistance                 General transfer comment: Pt refused, per RN min A for stand pivot with RW      Balance Overall  balance assessment: Needs assistance     Sitting balance - Comments: able to sit up in bed, refused EOB       Standing balance comment: refused                           ADL either performed or assessed with clinical judgement   ADL Overall ADL's : Needs assistance/impaired Eating/Feeding: Independent;Sitting   Grooming: Set up;Sitting   Upper Body Bathing: Minimal assistance;Sitting       Upper Body Dressing : Set up;Sitting                     General ADL Comments: Limited eval, Pt refused bed moblity and transfer. Per Nursing Pt pivot transfers to [facility] min A with RW,     Vision Baseline Vision/History: 1 Wears glasses Ability to See in Adequate Light: 1 Impaired Patient Visual Report: Other (comment) (occassional double vision)       Perception         Praxis         Pertinent Vitals/Pain Pain Assessment Pain Assessment: Faces Faces Pain Scale: Hurts even more Pain Location: R hip Pain Descriptors / Indicators: Aching, Sore Pain Intervention(s): Monitored during session     Extremity/Trunk Assessment             Communication Communication Communication: Hearing impairment   Cognition Arousal: Alert  Behavior During Therapy: WFL for tasks assessed/performed Overall Cognitive Status: Within Functional Limits for tasks assessed                                 General Comments: A/O x4, pleasant, conversational     General Comments       Exercises     Shoulder Instructions      Home Living Family/patient expects to be discharged to:: Skilled nursing facility                                 Additional Comments: from [facility], states she plans to go back      Prior Functioning/Environment Prior Level of Function : Independent/Modified Independent                        OT Problem List: Decreased strength;Decreased range of motion;Decreased activity tolerance;Impaired balance  (sitting and/or standing);Decreased safety awareness;Decreased knowledge of use of DME or AE;Decreased knowledge of precautions;Pain      OT Treatment/Interventions: Self-care/ADL training;Therapeutic exercise;Energy conservation;DME and/or AE instruction;Therapeutic activities;Patient/family education;Balance training    OT Goals(Current goals can be found in the care plan section) Acute Rehab OT Goals Patient Stated Goal: to improve mobility OT Goal Formulation: With patient Time For Goal Achievement: 11/01/23 Potential to Achieve Goals: Good  OT Frequency: Min 1X/week    Co-evaluation              AM-PAC OT "6 Clicks" Daily Activity     Outcome Measure Help from another person eating meals?: None Help from another person taking care of personal grooming?: A Little Help from another person toileting, which includes using toliet, bedpan, or urinal?: A Lot Help from another person bathing (including washing, rinsing, drying)?: A Lot Help from another person to put on and taking off regular upper body clothing?: A Little Help from another person to put on and taking off regular lower body clothing?: A Lot 6 Click Score: 16   End of Session Nurse Communication: Mobility status  Activity Tolerance: Other (comment) (limited due to decreased motivation to participate) Patient left: in bed;with call bell/phone within reach;with bed alarm set  OT Visit Diagnosis: Muscle weakness (generalized) (M62.81);Other abnormalities of gait and mobility (R26.89)                Time: 0347-4259 OT Time Calculation (min): 18 min Charges:  OT General Charges $OT Visit: 1 Visit OT Evaluation $OT Eval Low Complexity: 1 Low  [address], OTR/L   [name] 10/18/2023, 5:00 PM

## 2023-10-18 NOTE — Progress Notes (Signed)
    Subjective:  Patient reports pain as mild to moderate.  Denies N/V/CP/SOB/Abd pain. She reports that she has pain with movement. She denies any tingling or numbness in LE bilaterally.  Discussed changes in weightbearing status to touchdown weightbearing on the RLE with walker for at least 6 weeks to allow for healing.   Objective:   VITALS:   Vitals:   10/17/23 1938 10/18/23 0403 10/18/23 0500 10/18/23 0739  BP: (!) 131/48 (!) 133/47  (!) 158/53  Pulse: 73 76  69  Resp: 18 18  20   Temp: 98.4 F (36.9 C) 98.4 F (36.9 C)  98.5 F (36.9 C)  TempSrc: Oral Oral    SpO2: 96% 97%  96%  Weight:   86.2 kg   Height:        Patient lying in bed. NAD.   Neurologically intact ABD soft Neurovascular intact Sensation intact distally Intact pulses distally Dorsiflexion/Plantar flexion intact Incision: dressing C/D/I No cellulitis present Compartment soft   Lab Results  Component Value Date   WBC 9.4 10/17/2023   HGB 9.5 (L) 10/17/2023   HCT 29.1 (L) 10/17/2023   MCV 89.8 10/17/2023   PLT 236 10/17/2023   BMET    Component Value Date/Time   NA 137 10/18/2023 0451   NA 141 10/24/2020 1034   K 4.0 10/18/2023 0451   CL 104 10/18/2023 0451   CO2 23 10/18/2023 0451   GLUCOSE 201 (H) 10/18/2023 0451   BUN 17 10/18/2023 0451   BUN 22 10/24/2020 1034   CREATININE 0.97 10/18/2023 0451   CALCIUM 8.6 (L) 10/18/2023 0451   GFRNONAA 57 (L) 10/18/2023 0451     Assessment/Plan:     Principal Problem:   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  Dispo: Patient under care of the medical  team, disposition per their recommendation. Patients daughter at bedside. Discussed change in weightbearing status due to periprosthetic fracture that is currently stable. Continue with non-operative treatment. Patient to follow-up in office 2 weeks from surgery date.    Clois Dupes, PA-C 10/18/2023, 2:12 PM   [facility]  [place] [address], [place], [place] [address] Phone: [phone] [email or web address] Facebook  [employer]

## 2023-10-19 LAB — GLUCOSE, CAPILLARY
Glucose-Capillary: 149 mg/dL — ABNORMAL HIGH (ref 70–99)
Glucose-Capillary: 160 mg/dL — ABNORMAL HIGH (ref 70–99)
Glucose-Capillary: 169 mg/dL — ABNORMAL HIGH (ref 70–99)
Glucose-Capillary: 200 mg/dL — ABNORMAL HIGH (ref 70–99)
Glucose-Capillary: 200 mg/dL — ABNORMAL HIGH (ref 70–99)
Glucose-Capillary: 215 mg/dL — ABNORMAL HIGH (ref 70–99)

## 2023-10-19 NOTE — Plan of Care (Signed)

## 2023-10-19 NOTE — Plan of Care (Signed)

## 2023-10-19 NOTE — TOC CM/SW Note (Signed)
CSW was contacted by the bedside nurse regarding the patient going to the facility ([facility]). This Clinical research associate checked the [facility] notes and there was no documentation since the 10/13/23 the patient was going to [facility].   This Clinical research associate contacted [facility] and spoke with the facility rep, who noted they do not have the patient on the list aa a patient admitting today to their facility. The rep informed the CSW the patient can return to the facility tomorrow (10/20/23) in the morning time.   This Clinical research associate updated the bedside nurse to the information noted above via chat.   [employer] contacted the CSW regarding the patient's family, inquiring the patient returning to the facility. Nikkie noted the facility is/will be accepting the patient back to the [facility]. After processing the patient' discharge disposition with Lowella Bandy, the patient will discharge tomorrow morning and the facility will receive he patient by 10:30 AM.  CSW made contact with the patient   CSW made contact with the patient's daughter [name] / [phone]) and spoke with her regarding the pt's disposition and to the information above. The daughter was made aware the patient will arrive at the facility in the AM. The daughter requested, if the DME to arrive at the hospital, be sent to the facility [facility]).   TOC CSW Follow-up  CSW to arrange transport back to the facility [facility] prior so the pt can arrive before 10:30 AM.   Please check to see if the patient's DME can be sent to the facility.  No other needs identified of this Clinical research associate. Please contact TOC if there is additional disposition support needed.

## 2023-10-19 NOTE — Evaluation (Signed)
Physical Therapy Evaluation Patient Details Name: MCKINZIE PRICER MRN: [id number] DOB: [birth year] Today's Date: 10/19/2023  History of Present Illness  Patient is a 89 year old female who presents 10/16/23 with periprosthetic hip fx due to vasovagal episode while using bathroom. Pt recently admitted 10/09/23 for fall with R hip fx and THA by anterior approach.  PMH: DM, CHF, CAD, CVA (2012), CABG, L THR and L TKR.  Clinical Impression  Pt admitted with above diagnosis. Pt from SNF and plan is to return. She mentions that she was unable to get a BSC and bed rail after THA however, now that she is TDWB and very high fall risk she will need these things for safety as she is only able to transfer and maintain TDWB. Will not be able to ambulate any significant distance until WB status advanced. Patient will benefit from continued inpatient follow up therapy, <3 hours/day.  Pt currently with functional limitations due to the deficits listed below (see PT Problem List). Pt will benefit from acute skilled PT to increase their independence and safety with mobility to allow discharge.           If plan is discharge home, recommend the following: A lot of help with walking and/or transfers;A lot of help with bathing/dressing/bathroom;Assistance with cooking/housework;Assist for transportation;Help with stairs or ramp for entrance   Can travel by private vehicle   No    Equipment Recommendations BSC/3in1;Other (comment) (bed rail (at SNF))  Recommendations for Other Services       Functional Status Assessment Patient has had a recent decline in their functional status and demonstrates the ability to make significant improvements in function in a reasonable and predictable amount of time.     Precautions / Restrictions Precautions Precautions: Fall Restrictions Weight Bearing Restrictions: Yes RLE Weight Bearing: Non weight bearing      Mobility  Bed Mobility Overal bed mobility: Needs  Assistance Bed Mobility: Supine to Sit     Supine to sit: Mod assist, Used rails, HOB elevated     General bed mobility comments: pt needed mod A to RLE, heavy use of bed rail, mod A for elevation of trunk. Pt will need a bed rail at SNF for bed mobility and safety    Transfers Overall transfer level: Needs assistance Equipment used: Rolling walker (2 wheels) Transfers: Sit to/from Stand, Bed to chair/wheelchair/BSC Sit to Stand: Min assist Stand pivot transfers: Mod assist         General transfer comment: pt pivoted to R (instructed to pivot back to bed to L with nursing as this will be easier). Able to scoot LLE with wt through RW and keep RLE TDWB. Fatigues quickly.    Ambulation/Gait               General Gait Details: unable to ambulate and maintain RLE TDWB for any significant distance  Stairs            Wheelchair Mobility     Tilt Bed    Modified Rankin (Stroke Patients Only)       Balance Overall balance assessment: Needs assistance Sitting-balance support: No upper extremity supported, Feet supported Sitting balance-Leahy Scale: Good     Standing balance support: Bilateral upper extremity supported, During functional activity, Reliant on assistive device for balance Standing balance-Leahy Scale: Poor Standing balance comment: min A to maintain standing  Pertinent Vitals/Pain Pain Assessment Pain Assessment: 0-10 Pain Score: 5  Pain Location: R hip Pain Descriptors / Indicators: Aching, Sore Pain Intervention(s): Limited activity within patient's tolerance, Monitored during session, Patient requesting pain meds-RN notified    Home Living Family/patient expects to be discharged to:: Skilled nursing facility Living Arrangements: Spouse/significant other                 Additional Comments: from [facility], returning back    Prior Function Prior Level of Function : Independent/Modified  Independent             Mobility Comments: independent before first hip fx. Has been ambulating with PT and RW at SNF since that time       Extremity/Trunk Assessment   Upper Extremity Assessment Upper Extremity Assessment: Defer to OT evaluation;Generalized weakness    Lower Extremity Assessment Lower Extremity Assessment: RLE deficits/detail RLE Deficits / Details: hip flex 2-/5, knee ext 3-/5 ankle WFL RLE: Unable to fully assess due to pain RLE Sensation: WNL RLE Coordination: decreased gross motor    Cervical / Trunk Assessment Cervical / Trunk Assessment: Kyphotic  Communication   Communication Communication: Hearing impairment Cueing Techniques: Verbal cues  Cognition Arousal: Alert Behavior During Therapy: WFL for tasks assessed/performed Overall Cognitive Status: Within Functional Limits for tasks assessed                                 General Comments: mildly anxious        General Comments General comments (skin integrity, edema, etc.): VSS. Pt mildly dizzy with initial sitting, encouraged to hydrate    Exercises General Exercises - Lower Extremity Ankle Circles/Pumps: AROM, Both, 10 reps, Seated Quad Sets: AROM, Both, 10 reps, Seated Gluteal Sets: AROM, Both, 10 reps, Seated   Assessment/Plan    PT Assessment Patient needs continued PT services  PT Problem List Decreased strength;Decreased range of motion;Decreased activity tolerance;Decreased balance;Decreased mobility;Decreased knowledge of use of DME;Pain;Obesity       PT Treatment Interventions DME instruction;Gait training;Stair training;Functional mobility training;Therapeutic activities;Therapeutic exercise;Patient/family education;Balance training    PT Goals (Current goals can be found in the Care Plan section)  Acute Rehab PT Goals Patient Stated Goal: Regain IND PT Goal Formulation: With patient Time For Goal Achievement: 11/02/23 Potential to Achieve Goals:  Good    Frequency Min 1X/week     Co-evaluation               AM-PAC PT "6 Clicks" Mobility  Outcome Measure Help needed turning from your back to your side while in a flat bed without using bedrails?: A Lot Help needed moving from lying on your back to sitting on the side of a flat bed without using bedrails?: A Lot Help needed moving to and from a bed to a chair (including a wheelchair)?: A Lot Help needed standing up from a chair using your arms (e.g., wheelchair or bedside chair)?: A Lot Help needed to walk in hospital room?: Total Help needed climbing 3-5 steps with a railing? : Total 6 Click Score: 10    End of Session Equipment Utilized During Treatment: Gait belt Activity Tolerance: Patient tolerated treatment well;Patient limited by fatigue Patient left: with call bell/phone within reach;in chair;with chair alarm set;with family/visitor present Nurse Communication: Mobility status PT Visit Diagnosis: Difficulty in walking, not elsewhere classified (R26.2)    Time: 0865-7846 PT Time Calculation (min) (ACUTE ONLY): 35 min  Charges:   PT Evaluation $PT Eval Moderate Complexity: 1 Mod PT Treatments $Therapeutic Activity: 8-22 mins PT General Charges $$ ACUTE PT VISIT: 1 Visit         Lyanne Co, PT  Acute Rehab Services Secure chat preferred Office [phone]   Lawana Chambers Annamay Laymon 10/19/2023, 12:19 PM

## 2023-10-19 NOTE — Discharge Summary (Signed)
Physician Discharge Summary  [name] ZOX:[id number] DOB: 11/18/1934 DOA: 10/16/2023  PCP: Emilio Aspen, MD  Admit date: 10/16/2023 Discharge date: 10/20/2023 Admitted From: SNF Disposition: SNF Recommendations for Outpatient Follow-up:  Follow-up with orthopedic surgery in one week Check CMP and CBC in 1 week  Discharge Condition: Stable CODE STATUS: Full code  Follow-up Information     Emilio Aspen, MD Follow up.   Specialty: Internal Medicine Contact information: [address] [place] [address] [phone]         Samson Frederic, MD. Schedule an appointment as soon as possible for a visit in 1 week(s).   Specialty: Orthopedic Surgery Contact information: [address] [place] [address] [phone]                 Hospital course 89 year old F with PMH of recent right THA, paroxysmal flutter on Pradaxa, DM-2, diastolic CHF, psoriatic arthritis, HTN, HLD, anemia and morbid obesity brought to ED due to right hip pain after syncopal episode while using bathroom, and admitted for right periprosthetic right hip fracture and vasovagal syncope.  Patient had brief LOC but brought down to the floor by family members.  CT right hip showed evidence of nondisplaced periprosthetic fracture adjacent to right hip replacement shaft component.  Orthopedic surgery consulted and recommended hospitalist admission.    Patient has been evaluated by orthopedic surgery who recommended touchdown weightbearing on right lower extremity.  Evaluated by therapy, and discharged back to SNF.  Outpatient follow-up with orthopedic surgery as above.  Discharge summary completed by Dr. Alanda Slim but d/c was delayed an additional day and so Dr. Ashok Pall placed discharge orders on 10/21. D/c reviewed with patient's daughter who is in agreement with the plan below.  See individual problem list below for more.   Problems addressed during  this hospitalization Principal Problem:   Periprosthetic fracture around internal prosthetic right hip joint (HCC) Active Problems:   Essential hypertension   DM2 (diabetes mellitus, type 2) (HCC)   Syncope, vasovagal   Postoperative anemia   Paroxysmal atrial flutter (HCC)   Hyperlipidemia   Chronic diastolic CHF (congestive heart failure) (HCC)   Right periprosthetic right hip fracture: Patient with recent right THA.  Presents with right hip pain after syncopal episode.  No trauma or fall.  CT suggests nondisplaced periprosthetic fracture. -[facility] recommends nonoperative management with touchdown weightbearing -Continue Tylenol and tramadol to every 6 hours as needed -Discontinued oxycodone.  History of intolerance.  -Continue PT/OT at SNF -Fall precaution -Outpatient follow-up with orthopedic surgery in approximately 1 week   Vasovagal syncope: Had brief syncope while using bathroom.  She has brief LOC but brought down to floor before she fell.  EKG sinus rhythm with borderline QTc.  TTE in 2023 without significant finding other than G1-DD.  No event on telemetry.   Subacute postoperative anemia: No report of overt bleeding.  Anemia panel without significant iron deficiency.  H&H stable. -Check CBC in 1 week   Paroxysmal atrial flutter: Currently in sinus rhythm.  CHA2DS2-VASc score about 6.  On Coreg and Pradaxa at home. -Continue home Coreg and Pradaxa. -outpt cardiology f/u   IDDM-2 with hyperglycemia and hyperlipidemia: A1c 7.3% on 10/9. -Continue home meds. -Consider liberating diet   Chronic diastolic CHF: Appears euvolemic on exam but difficult due to body habitus.  TTE in 2023 without significant finding.  Not on diuretics other than low-dose Aldactone. -Continue home Aldactone   Essential hypertension: Normotensive for most  part. -Continue home melena.,  Aldactone and Coreg   Psoriatic arthritis-seems like she is on Enbrel.   Morbid obesity Body mass index  is 37.33 kg/m.            Time spent 25 minutes  Vital signs Vitals:   10/19/23 1608 10/19/23 2001 10/20/23 0530 10/20/23 0758  BP: (!) 120/43 (!) 144/48 (!) 156/55 (!) 141/52  Pulse: 79 68 74 71  Temp: 98.4 F (36.9 C)  98.1 F (36.7 C) 98.4 F (36.9 C)  Resp: 18 18 18 17   Height:      Weight:      SpO2: 95% 94% 94% 98%  TempSrc: Oral  Oral Oral  BMI (Calculated):         Discharge exam  GENERAL: No apparent distress.  Nontoxic. RESP:  No IWOB.  clear CVS:  RRR. Soft systolic murmur ABD/GI/GU: BS+. Abd soft, NTND.  MSK/EXT:  Moves extremities.  Dressing over right anterior thigh DCI.  No bruising or hematoma. SKIN: As above NEURO: Awake, alert and oriented appropriately.  No apparent focal neuro deficit. PSYCH: Calm. Normal affect.     Discharge Instructions Discharge Instructions     Diet - low sodium heart healthy   Complete by: As directed    Diet general   Complete by: As directed    Increase activity slowly   Complete by: As directed    Increase activity slowly   Complete by: As directed    Leave dressing on - Keep it clean, dry, and intact until clinic visit   Complete by: As directed    No wound care   Complete by: As directed       Allergies as of 10/20/2023       Reactions   Codeine Nausea And Vomiting   Penicillins Swelling, Other (See Comments)   Swelling of the tongue   Acetaminophen-codeine Other (See Comments)   Upset the stomach   Atenolol Other (See Comments)   Reaction unconfirmed- might have just be an ineffective med   Atorvastatin Other (See Comments)   Muscle soreness   Chloramphenicol Other (See Comments)   Reaction unconfirmed   Dapagliflozin Other (See Comments)    yeast infections   Empagliflozin Other (See Comments)   Yeast infections   Liraglutide Nausea Only, Other (See Comments)   Constipation, bloating also   Metformin Hcl Nausea Only   Metoprolol Other (See Comments)   Reaction unconfirmed   Zetia  [ezetimibe] Other (See Comments)   Reaction unconfirmed- might have just be an ineffective med   Sertraline Palpitations        Medication List     STOP taking these medications    oxyCODONE 5 MG immediate release tablet Commonly known as: Roxicodone       TAKE these medications    acetaminophen 325 MG tablet Commonly known as: TYLENOL Take 2 tablets (650 mg total) by mouth every 6 (six) hours as needed for mild pain (pain score 1-3), fever or headache. What changed:  when to take this reasons to take this Another medication with the same name was removed. Continue taking this medication, and follow the directions you see here.   Align 4 MG Caps Take 4 mg by mouth daily in the afternoon.   carvedilol 6.25 MG tablet Commonly known as: COREG Take 1 tablet (6.25 mg total) by mouth 2 (two) times daily. What changed: when to take this   Claritin Reditabs 10 MG dissolvable tablet Generic drug: loratadine Take 10  mg by mouth in the morning.   clonazePAM 0.5 MG tablet Commonly known as: KLONOPIN Take 1 tablet (0.5 mg total) by mouth at bedtime as needed for anxiety. What changed: when to take this   dabigatran 150 MG Caps capsule Commonly known as: PRADAXA Take 150 mg by mouth 2 (two) times daily.   docusate sodium 100 MG capsule Commonly known as: COLACE Take 100 mg by mouth at bedtime as needed for mild constipation.   Enbrel 50 MG/ML injection Generic drug: etanercept Inject 50 mg into the skin every Tuesday.   FreeStyle Libre 2 Sensor Misc Inject 1 Device into the skin every 14 (fourteen) days.   insulin glargine-yfgn 100 UNIT/ML injection Commonly known as: SEMGLEE Inject 0.3 mLs (30 Units total) into the skin 2 (two) times daily.   isosorbide mononitrate 30 MG 24 hr tablet Commonly known as: IMDUR Take 0.5 tablets (15 mg total) by mouth daily.   Lactulose 20 GM/30ML Soln Take 30 mLs by mouth every 4 (four) hours.   multivitamin with minerals Tabs  tablet Take 1 tablet by mouth daily.   nitroGLYCERIN 0.4 MG SL tablet Commonly known as: NITROSTAT Place 1 tablet (0.4 mg total) under the tongue every 5 (five) minutes x 3 doses as needed for chest pain.   polyethylene glycol 17 g packet Commonly known as: MIRALAX / GLYCOLAX Take 17 g by mouth 2 (two) times daily.   ramipril 5 MG capsule Commonly known as: ALTACE TAKE ONE CAPSULE BY MOUTH TWICE DAILY   rosuvastatin 10 MG tablet Commonly known as: CRESTOR Take 10 mg by mouth at bedtime.   senna 8.6 MG Tabs tablet Commonly known as: SENOKOT Take 1 tablet (8.6 mg total) by mouth 2 (two) times daily.   senna-docusate 8.6-50 MG tablet Commonly known as: Senokot-S Take 1 tablet by mouth 2 (two) times daily between meals as needed for mild constipation.   spironolactone 25 MG tablet Commonly known as: ALDACTONE Take 0.5 tablets (12.5 mg total) by mouth daily.   traMADol 50 MG tablet Commonly known as: ULTRAM Take 50 mg by mouth every 6 (six) hours as needed for moderate pain (pain score 4-6).   Tums Ultra 1000 1000 MG chewable tablet Generic drug: calcium elemental as carbonate Chew 1,000 mg by mouth 3 (three) times daily as needed for heartburn.   Vitamin B-12 2500 MCG Subl Place 2,500 mcg under the tongue every Monday, Wednesday, and Friday.   Vitamin D3 1000 units Caps Take 1,000 Units by mouth daily.               Durable Medical Equipment  (From admission, onward)           Start     Ordered   10/19/23 1254  For home use only DME Other see comment  Once       Comments: Bed rails  Question:  Length of Need  Answer:  6 Months   10/19/23 1254   10/19/23 1253  For home use only DME Walker rolling  Once       Question Answer Comment  Walker: With 5 Inch Wheels   Patient needs a walker to treat with the following condition S/P right hip fracture      10/19/23 1254   10/19/23 1253  For home use only DME 3 n 1  Once        10/19/23 1254               Discharge Care Instructions  (  From admission, onward)           Start     Ordered   10/20/23 0000  Leave dressing on - Keep it clean, dry, and intact until clinic visit        10/20/23 0913            Consultations: Orthopedic surgery  Procedures/Studies:   CT Hip Right Wo Contrast  Result Date: 10/16/2023 CLINICAL DATA:  Hip replacement, periprosthetic fracture suspected EXAM: CT OF THE RIGHT HIP WITHOUT CONTRAST TECHNIQUE: Multidetector CT imaging of the right hip was performed according to the standard protocol. Multiplanar CT image reconstructions were also generated. RADIATION DOSE REDUCTION: This exam was performed according to the departmental dose-optimization program which includes automated exposure control, adjustment of the mA and/or kV according to patient size and/or use of iterative reconstruction technique. COMPARISON:  Plain films today FINDINGS: Bones/Joint/Cartilage Prior right hip replacement. There is lucency through the anterior and posterior cortex adjacent to the shaft component compatible with periprosthetic nondisplaced fracture. No subluxation or dislocation. Ligaments Suboptimally assessed by CT. Muscles and Tendons Unremarkable Soft tissues Soft tissue stranding in the lateral subcutaneous soft tissues. IMPRESSION: Nondisplaced periprosthetic fracture adjacent to the right hip replacement shaft component. Electronically Signed   By: Charlett Nose M.D.   On: 10/16/2023 22:12   DG Hip Unilat With Pelvis 2-3 Views Right  Result Date: 10/16/2023 CLINICAL DATA:  Right hip pain. Recent hip surgery. Fall at nursing facility. EXAM: DG HIP (WITH OR WITHOUT PELVIS) 2-3V RIGHT COMPARISON:  Pelvis and right hip radiographs 10/07/2023 FINDINGS: Interval total right hip arthroplasty. Redemonstration of prior total left hip arthroplasty. No perihardware lucency is seen to indicate hardware failure or loosening. Expected right hip postoperative subcutaneous air.  Anterior surgical skin staples. Moderate pubic symphysis joint space narrowing, subchondral sclerosis, and peripheral osteophytosis. No acute fracture or dislocation. IMPRESSION: 1. Interval total right hip arthroplasty. No evidence of hardware failure or loosening. 2. No acute fracture or dislocation. Electronically Signed   By: Neita Garnet M.D.   On: 10/16/2023 17:58   DG Chest Port 1 View  Result Date: 10/16/2023 CLINICAL DATA:  Syncope and loss of consciousness EXAM: PORTABLE CHEST 1 VIEW COMPARISON:  10/07/2023 FINDINGS: Sternotomy and CABG. Stable cardiomediastinal silhouette. Aortic atherosclerotic calcification. Low lung volumes accentuate pulmonary vascularity. No focal consolidation, pleural effusion, or pneumothorax. No displaced rib fractures. IMPRESSION: No acute cardiopulmonary disease. Electronically Signed   By: Minerva Fester M.D.   On: 10/16/2023 15:25   DG HIP UNILAT WITH PELVIS 1V RIGHT  Result Date: 10/09/2023 CLINICAL DATA:  Elective surgery. EXAM: DG HIP (WITH OR WITHOUT PELVIS) 1V RIGHT COMPARISON:  Preoperative imaging. FINDINGS: Five fluoroscopic spot views of the pelvis and left hip obtained in the operating room. Sequential images during hip arthroplasty. Fluoroscopy time 13.6 seconds. Dose 3.2898 mGy. IMPRESSION: Intraoperative fluoroscopy for right hip arthroplasty. Electronically Signed   By: Narda Rutherford M.D.   On: 10/09/2023 20:26   DG Pelvis Portable  Result Date: 10/09/2023 CLINICAL DATA:  Postop right hip replacement EXAM: PORTABLE PELVIS 1-2 VIEWS COMPARISON:  10/07/2023 FINDINGS: SI joints are non widened. Pubic symphysis and rami appear intact. Stable left hip replacement. Interval right hip replacement with normal alignment. Gas in the soft tissues consistent with recent surgery IMPRESSION: Status post right hip replacement. Electronically Signed   By: Jasmine Pang M.D.   On: 10/09/2023 20:25   DG C-Arm 1-60 Min-No Report  Result Date:  10/09/2023 Fluoroscopy was utilized by the  requesting physician.  No radiographic interpretation.   DG C-Arm 1-60 Min-No Report  Result Date: 10/09/2023 Fluoroscopy was utilized by the requesting physician.  No radiographic interpretation.   DG Knee Right Port  Result Date: 10/08/2023 CLINICAL DATA:  Closed displaced fracture right femoral neck. EXAM: PORTABLE RIGHT KNEE - 1-2 VIEW COMPARISON:  Right knee radiographs 07/08/2007 FINDINGS: Diffuse decreased bone mineralization. Severe medial compartment joint space narrowing and peripheral osteophytosis. Mild peripheral lateral compartment degenerative osteophytosis. Severe patellofemoral joint space narrowing and peripheral osteophytosis. No joint effusion. No acute fracture or dislocation. Surgical clips again overlie the posteromedial knee. No acute fracture is seen. No dislocation. IMPRESSION: Severe medial and patellofemoral compartment osteoarthritis. Electronically Signed   By: Neita Garnet M.D.   On: 10/08/2023 08:42   CT HEAD WO CONTRAST ( )  Result Date: 10/07/2023 CLINICAL DATA:  Mental status change, unknown cause dizziness, fall; Neck trauma (Age >= 65y) EXAM: CT HEAD WITHOUT CONTRAST CT CERVICAL SPINE WITHOUT CONTRAST TECHNIQUE: Multidetector CT imaging of the head and cervical spine was performed following the standard protocol without intravenous contrast. Multiplanar CT image reconstructions of the cervical spine were also generated. RADIATION DOSE REDUCTION: This exam was performed according to the departmental dose-optimization program which includes automated exposure control, adjustment of the mA and/or kV according to patient size and/or use of iterative reconstruction technique. COMPARISON:  None Available. FINDINGS: CT HEAD FINDINGS Brain: Normal anatomic configuration. Parenchymal volume loss is commensurate with the patient's age. Stable mild periventricular white matter changes are present likely reflecting the sequela of  small vessel ischemia. Remote left cerebellar and right thalamic infarcts again noted. No abnormal intra or extra-axial mass lesion or fluid collection. No abnormal mass effect or midline shift. No evidence of acute intracranial hemorrhage or infarct. Ventricular size is normal. Cerebellum unremarkable. Vascular: No asymmetric hyperdense vasculature at the skull base. Skull: Intact Sinuses/Orbits: Paranasal sinuses are clear. Orbits are unremarkable. Other: Mastoid air cells and middle ear cavities are clear. CT CERVICAL SPINE FINDINGS Alignment: Normal. Skull base and vertebrae: Craniocervical alignment is normal. The atlantodental interval is not widened. No acute fracture of the cervical spine. Vertebral body height is preserved. Ankylosis of the facet joints of C3-4 noted bilaterally. Soft tissues and spinal canal: No prevertebral fluid or swelling. No visible canal hematoma. Disc levels: Intervertebral disc space narrowing and endplate remodeling at C5-C7 is present in keeping with changes of moderate degenerative disc disease. Prevertebral soft tissues are not thickened on sagittal reformats. Spinal canal is widely patent. Uncovertebral arthrosis results in moderate to severe right neuroforaminal narrowing at C5-6 and C6-7. Upper chest: Negative. Other: None IMPRESSION: 1. No acute intracranial abnormality. No calvarial fracture. 2. Stable senescent change and remote left cerebellar and right thalamic infarcts. 3. No acute fracture or listhesis of the cervical spine. 4. Multilevel degenerative disc and degenerative joint disease resulting in moderate to severe right neuroforaminal narrowing at C5-6 and C6-7. Electronically Signed   By: Helyn Numbers M.D.   On: 10/07/2023 20:09   CT Cervical Spine Wo Contrast  Result Date: 10/07/2023 CLINICAL DATA:  Mental status change, unknown cause dizziness, fall; Neck trauma (Age >= 65y) EXAM: CT HEAD WITHOUT CONTRAST CT CERVICAL SPINE WITHOUT CONTRAST TECHNIQUE:  Multidetector CT imaging of the head and cervical spine was performed following the standard protocol without intravenous contrast. Multiplanar CT image reconstructions of the cervical spine were also generated. RADIATION DOSE REDUCTION: This exam was performed according to the departmental dose-optimization program which includes automated exposure control, adjustment of  the mA and/or kV according to patient size and/or use of iterative reconstruction technique. COMPARISON:  None Available. FINDINGS: CT HEAD FINDINGS Brain: Normal anatomic configuration. Parenchymal volume loss is commensurate with the patient's age. Stable mild periventricular white matter changes are present likely reflecting the sequela of small vessel ischemia. Remote left cerebellar and right thalamic infarcts again noted. No abnormal intra or extra-axial mass lesion or fluid collection. No abnormal mass effect or midline shift. No evidence of acute intracranial hemorrhage or infarct. Ventricular size is normal. Cerebellum unremarkable. Vascular: No asymmetric hyperdense vasculature at the skull base. Skull: Intact Sinuses/Orbits: Paranasal sinuses are clear. Orbits are unremarkable. Other: Mastoid air cells and middle ear cavities are clear. CT CERVICAL SPINE FINDINGS Alignment: Normal. Skull base and vertebrae: Craniocervical alignment is normal. The atlantodental interval is not widened. No acute fracture of the cervical spine. Vertebral body height is preserved. Ankylosis of the facet joints of C3-4 noted bilaterally. Soft tissues and spinal canal: No prevertebral fluid or swelling. No visible canal hematoma. Disc levels: Intervertebral disc space narrowing and endplate remodeling at C5-C7 is present in keeping with changes of moderate degenerative disc disease. Prevertebral soft tissues are not thickened on sagittal reformats. Spinal canal is widely patent. Uncovertebral arthrosis results in moderate to severe right neuroforaminal  narrowing at C5-6 and C6-7. Upper chest: Negative. Other: None IMPRESSION: 1. No acute intracranial abnormality. No calvarial fracture. 2. Stable senescent change and remote left cerebellar and right thalamic infarcts. 3. No acute fracture or listhesis of the cervical spine. 4. Multilevel degenerative disc and degenerative joint disease resulting in moderate to severe right neuroforaminal narrowing at C5-6 and C6-7. Electronically Signed   By: Helyn Numbers M.D.   On: 10/07/2023 20:09   DG Chest 1 View  Result Date: 10/07/2023 CLINICAL DATA:  Hip pain with fall EXAM: CHEST  1 VIEW COMPARISON:  04/17/2019 FINDINGS: Post sternotomy changes. Low lung volumes. No acute airspace disease or pleural effusion. Aortic atherosclerosis. No pneumothorax. IMPRESSION: Low lung volumes. Electronically Signed   By: Jasmine Pang M.D.   On: 10/07/2023 19:51   DG Hip Unilat W or Wo Pelvis 2-3 Views Right  Result Date: 10/07/2023 CLINICAL DATA:  Right hip pain after a fall. EXAM: DG HIP (WITH OR WITHOUT PELVIS) 2-3V RIGHT COMPARISON:  None Available. FINDINGS: Suspicion of subcapital femoral neck fracture on the right. Limited detail because of patient's size. Consider CT for confirmation. No other pelvic fracture. Previous hip replacement on the left. IMPRESSION: Suspicion of subcapital femoral neck fracture on the right. Consider CT for confirmation. Electronically Signed   By: Paulina Fusi M.D.   On: 10/07/2023 19:50       The results of significant diagnostics from this hospitalization (including imaging, microbiology, ancillary and laboratory) are listed below for reference.     Microbiology: No results found for this or any previous visit (from the past 240 hour(s)).   Labs:  CBC: Recent Labs  Lab 10/16/23 1251 10/17/23 0758  WBC 9.3 9.4  NEUTROABS 5.7 5.4  HGB 9.6* 9.5*  HCT 29.4* 29.1*  MCV 93.6 89.8  PLT 193 236   BMP &GFR Recent Labs  Lab 10/16/23 1407 10/17/23 0758 10/18/23 0451  NA  138 137 137  K 4.5 3.9 4.0  CL 108 103 104  CO2 23 24 23   GLUCOSE 136* 109* 201*  BUN 20 18 17   CREATININE 0.99 0.87 0.97  CALCIUM 8.3* 8.5* 8.6*  MG  --  2.1 2.0  PHOS  --   --  3.2   Estimated Creatinine Clearance: 40 mL/min (by C-G formula based on SCr of 0.97 mg/dL). Liver & Pancreas: Recent Labs  Lab 10/16/23 1407 10/17/23 0758 10/18/23 0451  AST 29 22  --   ALT 23 23  --   ALKPHOS 77 76  --   BILITOT 0.9 0.7  --   PROT 5.2* 5.6*  --   ALBUMIN 2.5* 2.6* 2.4*   No results for input(s): "LIPASE", "AMYLASE" in the last 168 hours. No results for input(s): "AMMONIA" in the last 168 hours. Diabetic: No results for input(s): "HGBA1C" in the last 72 hours. Recent Labs  Lab 10/19/23 0605 10/19/23 0826 10/19/23 1214 10/19/23 1636 10/19/23 2154  GLUCAP 149* 215* 200* 169* 160*   Cardiac Enzymes: No results for input(s): "CKTOTAL", "CKMB", "CKMBINDEX", "TROPONINI" in the last 168 hours. No results for input(s): "PROBNP" in the last 8760 hours. Coagulation Profile: Recent Labs  Lab 10/17/23 0758  INR 1.3*   Thyroid Function Tests: No results for input(s): "TSH", "T4TOTAL", "FREET4", "T3FREE", "THYROIDAB" in the last 72 hours. Lipid Profile: No results for input(s): "CHOL", "HDL", "LDLCALC", "TRIG", "CHOLHDL", "LDLDIRECT" in the last 72 hours. Anemia Panel: No results for input(s): "VITAMINB12", "FOLATE", "FERRITIN", "TIBC", "IRON", "RETICCTPCT" in the last 72 hours.  Urine analysis:    Component Value Date/Time   COLORURINE YELLOW 04/18/2019 0520   APPEARANCEUR CLEAR 04/18/2019 0520   LABSPEC 1.020 04/18/2019 0520   PHURINE 5.0 04/18/2019 0520   GLUCOSEU NEGATIVE 04/18/2019 0520   HGBUR TRACE (A) 04/18/2019 0520   BILIRUBINUR NEGATIVE 04/18/2019 0520   KETONESUR TRACE (A) 04/18/2019 0520   PROTEINUR NEGATIVE 04/18/2019 0520   UROBILINOGEN 0.2 09/05/2015 1531   NITRITE NEGATIVE 04/18/2019 0520   LEUKOCYTESUR NEGATIVE 04/18/2019 0520   Sepsis Labs: Invalid  input(s): "PROCALCITONIN", "LACTICIDVEN"   SIGNED:  Silvano Bilis, MD  [facility] 10/20/2023, 9:14 AM

## 2023-10-20 DIAGNOSIS — S72001D Fracture of unspecified part of neck of right femur, subsequent encounter for closed fracture with routine healing: Secondary | ICD-10-CM | POA: Diagnosis not present

## 2023-10-20 DIAGNOSIS — S72031D Displaced midcervical fracture of right femur, subsequent encounter for closed fracture with routine healing: Secondary | ICD-10-CM | POA: Diagnosis not present

## 2023-10-20 DIAGNOSIS — I2510 Atherosclerotic heart disease of native coronary artery without angina pectoris: Secondary | ICD-10-CM | POA: Diagnosis not present

## 2023-10-20 DIAGNOSIS — L4050 Arthropathic psoriasis, unspecified: Secondary | ICD-10-CM | POA: Diagnosis not present

## 2023-10-20 DIAGNOSIS — M069 Rheumatoid arthritis, unspecified: Secondary | ICD-10-CM | POA: Diagnosis not present

## 2023-10-20 DIAGNOSIS — R2681 Unsteadiness on feet: Secondary | ICD-10-CM | POA: Diagnosis not present

## 2023-10-20 DIAGNOSIS — Z471 Aftercare following joint replacement surgery: Secondary | ICD-10-CM | POA: Diagnosis not present

## 2023-10-20 DIAGNOSIS — Z951 Presence of aortocoronary bypass graft: Secondary | ICD-10-CM | POA: Diagnosis not present

## 2023-10-20 DIAGNOSIS — M6281 Muscle weakness (generalized): Secondary | ICD-10-CM | POA: Diagnosis not present

## 2023-10-20 DIAGNOSIS — M810 Age-related osteoporosis without current pathological fracture: Secondary | ICD-10-CM | POA: Diagnosis not present

## 2023-10-20 DIAGNOSIS — I69398 Other sequelae of cerebral infarction: Secondary | ICD-10-CM | POA: Diagnosis not present

## 2023-10-20 DIAGNOSIS — R110 Nausea: Secondary | ICD-10-CM | POA: Diagnosis not present

## 2023-10-20 DIAGNOSIS — I482 Chronic atrial fibrillation, unspecified: Secondary | ICD-10-CM | POA: Diagnosis not present

## 2023-10-20 DIAGNOSIS — M9701XD Periprosthetic fracture around internal prosthetic right hip joint, subsequent encounter: Secondary | ICD-10-CM | POA: Diagnosis not present

## 2023-10-20 DIAGNOSIS — I129 Hypertensive chronic kidney disease with stage 1 through stage 4 chronic kidney disease, or unspecified chronic kidney disease: Secondary | ICD-10-CM | POA: Diagnosis not present

## 2023-10-20 DIAGNOSIS — I5032 Chronic diastolic (congestive) heart failure: Secondary | ICD-10-CM | POA: Diagnosis not present

## 2023-10-20 DIAGNOSIS — E1165 Type 2 diabetes mellitus with hyperglycemia: Secondary | ICD-10-CM | POA: Diagnosis not present

## 2023-10-20 DIAGNOSIS — R1310 Dysphagia, unspecified: Secondary | ICD-10-CM | POA: Diagnosis not present

## 2023-10-20 DIAGNOSIS — N1831 Chronic kidney disease, stage 3a: Secondary | ICD-10-CM | POA: Diagnosis not present

## 2023-10-20 DIAGNOSIS — R42 Dizziness and giddiness: Secondary | ICD-10-CM | POA: Diagnosis not present

## 2023-10-20 DIAGNOSIS — M1990 Unspecified osteoarthritis, unspecified site: Secondary | ICD-10-CM | POA: Diagnosis not present

## 2023-10-20 DIAGNOSIS — Z96641 Presence of right artificial hip joint: Secondary | ICD-10-CM | POA: Diagnosis not present

## 2023-10-20 DIAGNOSIS — I69391 Dysphagia following cerebral infarction: Secondary | ICD-10-CM | POA: Diagnosis not present

## 2023-10-20 DIAGNOSIS — Z9181 History of falling: Secondary | ICD-10-CM | POA: Diagnosis not present

## 2023-10-20 DIAGNOSIS — M25551 Pain in right hip: Secondary | ICD-10-CM | POA: Diagnosis not present

## 2023-10-20 DIAGNOSIS — Z7401 Bed confinement status: Secondary | ICD-10-CM | POA: Diagnosis not present

## 2023-10-20 DIAGNOSIS — E785 Hyperlipidemia, unspecified: Secondary | ICD-10-CM | POA: Diagnosis not present

## 2023-10-20 DIAGNOSIS — I480 Paroxysmal atrial fibrillation: Secondary | ICD-10-CM | POA: Diagnosis not present

## 2023-10-20 DIAGNOSIS — E1122 Type 2 diabetes mellitus with diabetic chronic kidney disease: Secondary | ICD-10-CM | POA: Diagnosis not present

## 2023-10-20 DIAGNOSIS — R1312 Dysphagia, oropharyngeal phase: Secondary | ICD-10-CM | POA: Diagnosis not present

## 2023-10-20 DIAGNOSIS — R41841 Cognitive communication deficit: Secondary | ICD-10-CM | POA: Diagnosis not present

## 2023-10-20 DIAGNOSIS — R262 Difficulty in walking, not elsewhere classified: Secondary | ICD-10-CM | POA: Diagnosis not present

## 2023-10-20 DIAGNOSIS — M9701XA Periprosthetic fracture around internal prosthetic right hip joint, initial encounter: Secondary | ICD-10-CM | POA: Diagnosis not present

## 2023-10-20 DIAGNOSIS — S72091D Other fracture of head and neck of right femur, subsequent encounter for closed fracture with routine healing: Secondary | ICD-10-CM | POA: Diagnosis not present

## 2023-10-20 DIAGNOSIS — R2689 Other abnormalities of gait and mobility: Secondary | ICD-10-CM | POA: Diagnosis not present

## 2023-10-20 LAB — GLUCOSE, CAPILLARY: Glucose-Capillary: 185 mg/dL — ABNORMAL HIGH (ref 70–99)

## 2023-10-20 MED ORDER — TRAMADOL HCL 50 MG PO TABS: 50.0000 mg | ORAL_TABLET | Freq: Four times a day (QID) | ORAL | 0 refills | Status: AC | PRN

## 2023-10-20 MED ORDER — CLONAZEPAM 0.5 MG PO TABS: 0.5000 mg | ORAL_TABLET | Freq: Every day | ORAL | 0 refills | Status: AC | PRN

## 2023-10-20 NOTE — Consult Note (Signed)
Value-Based Care Institute  [facility] Inpatient Consult   10/20/2023  JAZLY EMPEY 02/11/1934 [id number]  [facility] [[facility]]  Accountable Care Organization [ACO] Patient: Medicare ACO REACH  Primary Care Provider:  Emilio Aspen, MD with [facility] provider   Patient was reviewed for less than 7 days readmission medium high risk score with a 3 day length of stay and assess for barriers to care for community.  Patient was screened for hospitalization and on behalf of [facility] /[facility] Care Coordination to assess for post hospital community care needs.  Patient is being considered for a skilled nursing facility level of care for post hospital transition.  10:50 Met with patient at the bedside to explain reason for bedside visit and for post hospital post SNF needs.  Patient endorses PCP and she also states husband at home with 24 hour caregivers.  Daughter is the contact Erskine Squibb. Explain can have Community RN PAC to follow up for post facility needs. Patient states for [facility].  If the patient goes to a [facility] affiliated facility then, patient can be followed by [facility] RN with traditional Medicare and approved Medicare Advantage plans.    Plan:   Will notify the Community [facility] RN can follow for any known or needs for transitional care needs for returning to post facility care coordination needs to return to community.  For questions or referrals, please contact:   Charlesetta Shanks, RN, BSN, CCM [place]  [facility], [facility] Liaison Direct Dial: [phone] or secure chat Website: [email or web address]

## 2023-10-20 NOTE — TOC Transition Note (Addendum)
Transition of Care [facility]) - CM/SW Discharge Note   Patient Details  Name: [name] MRN: [id number] Date of Birth: November 07, 1934  Transition of Care [facility]) CM/SW Contact:  Lorri Frederick, LCSW Phone Number: 10/20/2023, 10:16 AM   Clinical Narrative:  Pt discharging to [facility].  RN call report to [phone].   0830: CSW confirmed with [facility] that they can receive pt today.  Daughter Erskine Squibb asking about bedside commode to be brought with pt to [facility], confirmed with Lowella Bandy that they provide DME, daughter informed.    CSW then spoke to pt who reports beside commode was delivered yesterday and picked up by her son.  Daughter updated.    Final next level of care: Skilled Nursing Facility Barriers to Discharge: Barriers Resolved   Patient Goals and CMS Choice      Discharge Placement                Patient chooses bed at: [facility] Patient to be transferred to facility by: ptar Name of family member notified: daughter Erskine Squibb Patient and family notified of of transfer: 10/20/23  Discharge Plan and Services Additional resources added to the After Visit Summary for                  DME Arranged: 3-N-1 DME Agency: [employer] Date DME Agency Contacted: 10/19/23 Time DME Agency Contacted: 1258 Representative spoke with at DME Agency: Vaughan Basta            Social Determinants of Health (SDOH) Interventions SDOH Screenings   Food Insecurity: No Food Insecurity (10/07/2023)  Housing: Low Risk  (10/07/2023)  Transportation Needs: No Transportation Needs (10/07/2023)  Utilities: Not At Risk (10/07/2023)  Tobacco Use: Low Risk  (10/17/2023)     Readmission Risk Interventions     No data to display

## 2023-10-20 NOTE — Progress Notes (Signed)
10/21 Patient discharged before IM Letter was given. Letter will mailed to patient's address on file.

## 2023-10-20 NOTE — Progress Notes (Signed)
Report called to provided number

## 2023-10-20 NOTE — Care Management Important Message (Signed)
Important Message  Patient Details  Name: [name] MRN: [id number] Date of Birth: 1934-08-19   Important Message Given:  No     Sherilyn Banker 10/20/2023, 4:21 PM

## 2023-10-23 DIAGNOSIS — M6281 Muscle weakness (generalized): Secondary | ICD-10-CM | POA: Diagnosis not present

## 2023-10-23 DIAGNOSIS — Z9181 History of falling: Secondary | ICD-10-CM | POA: Diagnosis not present

## 2023-10-23 DIAGNOSIS — I5032 Chronic diastolic (congestive) heart failure: Secondary | ICD-10-CM | POA: Diagnosis not present

## 2023-10-23 DIAGNOSIS — S72091D Other fracture of head and neck of right femur, subsequent encounter for closed fracture with routine healing: Secondary | ICD-10-CM | POA: Diagnosis not present

## 2023-10-23 DIAGNOSIS — I480 Paroxysmal atrial fibrillation: Secondary | ICD-10-CM | POA: Diagnosis not present

## 2023-10-23 DIAGNOSIS — R2681 Unsteadiness on feet: Secondary | ICD-10-CM | POA: Diagnosis not present

## 2023-10-23 DIAGNOSIS — R2689 Other abnormalities of gait and mobility: Secondary | ICD-10-CM | POA: Diagnosis not present

## 2023-10-24 DIAGNOSIS — E1165 Type 2 diabetes mellitus with hyperglycemia: Secondary | ICD-10-CM | POA: Diagnosis not present

## 2023-10-24 DIAGNOSIS — M6281 Muscle weakness (generalized): Secondary | ICD-10-CM | POA: Diagnosis not present

## 2023-10-24 DIAGNOSIS — S72001D Fracture of unspecified part of neck of right femur, subsequent encounter for closed fracture with routine healing: Secondary | ICD-10-CM | POA: Diagnosis not present

## 2023-10-27 DIAGNOSIS — I5032 Chronic diastolic (congestive) heart failure: Secondary | ICD-10-CM | POA: Diagnosis not present

## 2023-10-27 DIAGNOSIS — E1165 Type 2 diabetes mellitus with hyperglycemia: Secondary | ICD-10-CM | POA: Diagnosis not present

## 2023-10-27 DIAGNOSIS — Z9181 History of falling: Secondary | ICD-10-CM | POA: Diagnosis not present

## 2023-10-27 DIAGNOSIS — M6281 Muscle weakness (generalized): Secondary | ICD-10-CM | POA: Diagnosis not present

## 2023-10-27 DIAGNOSIS — I482 Chronic atrial fibrillation, unspecified: Secondary | ICD-10-CM | POA: Diagnosis not present

## 2023-10-27 DIAGNOSIS — S72091D Other fracture of head and neck of right femur, subsequent encounter for closed fracture with routine healing: Secondary | ICD-10-CM | POA: Diagnosis not present

## 2023-10-27 DIAGNOSIS — R2689 Other abnormalities of gait and mobility: Secondary | ICD-10-CM | POA: Diagnosis not present

## 2023-10-27 DIAGNOSIS — S72031D Displaced midcervical fracture of right femur, subsequent encounter for closed fracture with routine healing: Secondary | ICD-10-CM | POA: Diagnosis not present

## 2023-10-27 DIAGNOSIS — I480 Paroxysmal atrial fibrillation: Secondary | ICD-10-CM | POA: Diagnosis not present

## 2023-10-27 DIAGNOSIS — R2681 Unsteadiness on feet: Secondary | ICD-10-CM | POA: Diagnosis not present

## 2023-10-27 DIAGNOSIS — S72001D Fracture of unspecified part of neck of right femur, subsequent encounter for closed fracture with routine healing: Secondary | ICD-10-CM | POA: Diagnosis not present

## 2023-10-27 DIAGNOSIS — R110 Nausea: Secondary | ICD-10-CM | POA: Diagnosis not present

## 2023-10-27 NOTE — Anesthesia Postprocedure Evaluation (Signed)
Anesthesia Post Note  Patient: [name]  Procedure(s) Performed: TOTAL HIP ARTHROPLASTY ANTERIOR APPROACH (Right: Hip)     Patient location during evaluation: PACU Anesthesia Type: General Level of consciousness: awake Pain management: pain level controlled Vital Signs Assessment: post-procedure vital signs reviewed and stable Respiratory status: spontaneous breathing, nonlabored ventilation and respiratory function stable Cardiovascular status: blood pressure returned to baseline and stable Postop Assessment: no apparent nausea or vomiting Anesthetic complications: no   No notable events documented.  Last Vitals:  Vitals:   10/12/23 2206 10/13/23 0644  BP: (!) 140/54 (!) 136/56  Pulse: 83 84  Resp: 17 16  Temp: 37.1 C 37.1 C  SpO2: 99% 94%    Last Pain:  Vitals:   10/13/23 0726  TempSrc:   PainSc: 1    Pain Goal: Patients Stated Pain Goal: 2 (10/13/23 0649)                 Catheryn Bacon Jubilee Vivero

## 2023-11-02 DIAGNOSIS — M6281 Muscle weakness (generalized): Secondary | ICD-10-CM | POA: Diagnosis not present

## 2023-11-02 DIAGNOSIS — S72001D Fracture of unspecified part of neck of right femur, subsequent encounter for closed fracture with routine healing: Secondary | ICD-10-CM | POA: Diagnosis not present

## 2023-11-02 DIAGNOSIS — I482 Chronic atrial fibrillation, unspecified: Secondary | ICD-10-CM | POA: Diagnosis not present

## 2023-11-02 DIAGNOSIS — E1165 Type 2 diabetes mellitus with hyperglycemia: Secondary | ICD-10-CM | POA: Diagnosis not present

## 2023-11-03 DIAGNOSIS — I482 Chronic atrial fibrillation, unspecified: Secondary | ICD-10-CM | POA: Diagnosis not present

## 2023-11-03 DIAGNOSIS — M6281 Muscle weakness (generalized): Secondary | ICD-10-CM | POA: Diagnosis not present

## 2023-11-03 DIAGNOSIS — I5032 Chronic diastolic (congestive) heart failure: Secondary | ICD-10-CM | POA: Diagnosis not present

## 2023-11-03 DIAGNOSIS — R2681 Unsteadiness on feet: Secondary | ICD-10-CM | POA: Diagnosis not present

## 2023-11-03 DIAGNOSIS — R110 Nausea: Secondary | ICD-10-CM | POA: Diagnosis not present

## 2023-11-03 DIAGNOSIS — S72001D Fracture of unspecified part of neck of right femur, subsequent encounter for closed fracture with routine healing: Secondary | ICD-10-CM | POA: Diagnosis not present

## 2023-11-03 DIAGNOSIS — I480 Paroxysmal atrial fibrillation: Secondary | ICD-10-CM | POA: Diagnosis not present

## 2023-11-03 DIAGNOSIS — R2689 Other abnormalities of gait and mobility: Secondary | ICD-10-CM | POA: Diagnosis not present

## 2023-11-03 DIAGNOSIS — S72091D Other fracture of head and neck of right femur, subsequent encounter for closed fracture with routine healing: Secondary | ICD-10-CM | POA: Diagnosis not present

## 2023-11-03 DIAGNOSIS — Z9181 History of falling: Secondary | ICD-10-CM | POA: Diagnosis not present

## 2023-11-06 ENCOUNTER — Ambulatory Visit (HOSPITAL_BASED_OUTPATIENT_CLINIC_OR_DEPARTMENT_OTHER): Payer: Medicare Other | Admitting: Cardiology

## 2023-11-06 DIAGNOSIS — S72091D Other fracture of head and neck of right femur, subsequent encounter for closed fracture with routine healing: Secondary | ICD-10-CM | POA: Diagnosis not present

## 2023-11-06 DIAGNOSIS — Z9181 History of falling: Secondary | ICD-10-CM | POA: Diagnosis not present

## 2023-11-06 DIAGNOSIS — R2689 Other abnormalities of gait and mobility: Secondary | ICD-10-CM | POA: Diagnosis not present

## 2023-11-06 DIAGNOSIS — M6281 Muscle weakness (generalized): Secondary | ICD-10-CM | POA: Diagnosis not present

## 2023-11-06 DIAGNOSIS — I5032 Chronic diastolic (congestive) heart failure: Secondary | ICD-10-CM | POA: Diagnosis not present

## 2023-11-06 DIAGNOSIS — I480 Paroxysmal atrial fibrillation: Secondary | ICD-10-CM | POA: Diagnosis not present

## 2023-11-06 DIAGNOSIS — R2681 Unsteadiness on feet: Secondary | ICD-10-CM | POA: Diagnosis not present

## 2023-11-10 DIAGNOSIS — R2681 Unsteadiness on feet: Secondary | ICD-10-CM | POA: Diagnosis not present

## 2023-11-10 DIAGNOSIS — I480 Paroxysmal atrial fibrillation: Secondary | ICD-10-CM | POA: Diagnosis not present

## 2023-11-10 DIAGNOSIS — M6281 Muscle weakness (generalized): Secondary | ICD-10-CM | POA: Diagnosis not present

## 2023-11-10 DIAGNOSIS — R2689 Other abnormalities of gait and mobility: Secondary | ICD-10-CM | POA: Diagnosis not present

## 2023-11-10 DIAGNOSIS — I5032 Chronic diastolic (congestive) heart failure: Secondary | ICD-10-CM | POA: Diagnosis not present

## 2023-11-10 DIAGNOSIS — S72091D Other fracture of head and neck of right femur, subsequent encounter for closed fracture with routine healing: Secondary | ICD-10-CM | POA: Diagnosis not present

## 2023-11-10 DIAGNOSIS — S72001D Fracture of unspecified part of neck of right femur, subsequent encounter for closed fracture with routine healing: Secondary | ICD-10-CM | POA: Diagnosis not present

## 2023-11-10 DIAGNOSIS — I482 Chronic atrial fibrillation, unspecified: Secondary | ICD-10-CM | POA: Diagnosis not present

## 2023-11-10 DIAGNOSIS — Z9181 History of falling: Secondary | ICD-10-CM | POA: Diagnosis not present

## 2023-11-10 DIAGNOSIS — E1165 Type 2 diabetes mellitus with hyperglycemia: Secondary | ICD-10-CM | POA: Diagnosis not present

## 2023-11-13 DIAGNOSIS — Z9181 History of falling: Secondary | ICD-10-CM | POA: Diagnosis not present

## 2023-11-13 DIAGNOSIS — I480 Paroxysmal atrial fibrillation: Secondary | ICD-10-CM | POA: Diagnosis not present

## 2023-11-13 DIAGNOSIS — R2681 Unsteadiness on feet: Secondary | ICD-10-CM | POA: Diagnosis not present

## 2023-11-13 DIAGNOSIS — S72091D Other fracture of head and neck of right femur, subsequent encounter for closed fracture with routine healing: Secondary | ICD-10-CM | POA: Diagnosis not present

## 2023-11-13 DIAGNOSIS — R2689 Other abnormalities of gait and mobility: Secondary | ICD-10-CM | POA: Diagnosis not present

## 2023-11-13 DIAGNOSIS — M6281 Muscle weakness (generalized): Secondary | ICD-10-CM | POA: Diagnosis not present

## 2023-11-13 DIAGNOSIS — I5032 Chronic diastolic (congestive) heart failure: Secondary | ICD-10-CM | POA: Diagnosis not present

## 2023-11-19 ENCOUNTER — Other Ambulatory Visit: Payer: Self-pay | Admitting: *Deleted

## 2023-11-19 DIAGNOSIS — E1165 Type 2 diabetes mellitus with hyperglycemia: Secondary | ICD-10-CM | POA: Diagnosis not present

## 2023-11-19 NOTE — Patient Outreach (Signed)
Post- Acute Care Manager follow up. Mrs. Mahadevan resides in [facility] skilled nursing facility.  Screening for potential chronic care management services as a benefit of health plan and primary care provider.  Collaboration meeting with [facility] psychotherapist. Mrs. Macario is from home with spouse. However, spouse has caregivers. Currently Mrs. Guida is NWB status.  Currently off of skilled therapy until weight bearing restrictions have been lifted. Follow up ortho appointment is scheduled for this week.   Will continue to follow.   Raiford Noble, MSN, RN, BSN [place]  [facility], [facility] RN Post- Acute Care Manager Direct Dial: [phone]

## 2023-11-24 DIAGNOSIS — Z471 Aftercare following joint replacement surgery: Secondary | ICD-10-CM | POA: Diagnosis not present

## 2023-11-24 DIAGNOSIS — Z96641 Presence of right artificial hip joint: Secondary | ICD-10-CM | POA: Diagnosis not present

## 2023-11-24 DIAGNOSIS — S72031D Displaced midcervical fracture of right femur, subsequent encounter for closed fracture with routine healing: Secondary | ICD-10-CM | POA: Diagnosis not present

## 2023-11-26 ENCOUNTER — Telehealth (HOSPITAL_BASED_OUTPATIENT_CLINIC_OR_DEPARTMENT_OTHER): Payer: Self-pay | Admitting: *Deleted

## 2023-11-26 NOTE — Telephone Encounter (Addendum)
Patients appointment moved at request of son Dr Alcide Evener, patient had recent fall New appointment 1/13 at 3:20 pm   Left message to call back

## 2023-12-05 NOTE — Telephone Encounter (Signed)
Advised patient and mailed calendar to home address

## 2023-12-11 ENCOUNTER — Other Ambulatory Visit: Payer: Self-pay

## 2023-12-11 MED ORDER — RAMIPRIL 5 MG PO CAPS: ORAL_CAPSULE | ORAL | 1 refills | Status: DC

## 2023-12-16 ENCOUNTER — Ambulatory Visit (HOSPITAL_BASED_OUTPATIENT_CLINIC_OR_DEPARTMENT_OTHER): Payer: Medicare Other | Admitting: Cardiology

## 2023-12-22 DIAGNOSIS — S72031D Displaced midcervical fracture of right femur, subsequent encounter for closed fracture with routine healing: Secondary | ICD-10-CM | POA: Diagnosis not present

## 2023-12-22 DIAGNOSIS — M9701XD Periprosthetic fracture around internal prosthetic right hip joint, subsequent encounter: Secondary | ICD-10-CM | POA: Diagnosis not present

## 2023-12-23 DIAGNOSIS — Z9181 History of falling: Secondary | ICD-10-CM | POA: Diagnosis not present

## 2023-12-23 DIAGNOSIS — E1122 Type 2 diabetes mellitus with diabetic chronic kidney disease: Secondary | ICD-10-CM | POA: Diagnosis not present

## 2023-12-23 DIAGNOSIS — Z96641 Presence of right artificial hip joint: Secondary | ICD-10-CM | POA: Diagnosis not present

## 2023-12-23 DIAGNOSIS — D631 Anemia in chronic kidney disease: Secondary | ICD-10-CM | POA: Diagnosis not present

## 2023-12-23 DIAGNOSIS — Z556 Problems related to health literacy: Secondary | ICD-10-CM | POA: Diagnosis not present

## 2023-12-23 DIAGNOSIS — I2510 Atherosclerotic heart disease of native coronary artery without angina pectoris: Secondary | ICD-10-CM | POA: Diagnosis not present

## 2023-12-23 DIAGNOSIS — I480 Paroxysmal atrial fibrillation: Secondary | ICD-10-CM | POA: Diagnosis not present

## 2023-12-23 DIAGNOSIS — Z951 Presence of aortocoronary bypass graft: Secondary | ICD-10-CM | POA: Diagnosis not present

## 2023-12-23 DIAGNOSIS — E785 Hyperlipidemia, unspecified: Secondary | ICD-10-CM | POA: Diagnosis not present

## 2023-12-23 DIAGNOSIS — L4050 Arthropathic psoriasis, unspecified: Secondary | ICD-10-CM | POA: Diagnosis not present

## 2023-12-23 DIAGNOSIS — I4892 Unspecified atrial flutter: Secondary | ICD-10-CM | POA: Diagnosis not present

## 2023-12-23 DIAGNOSIS — Z993 Dependence on wheelchair: Secondary | ICD-10-CM | POA: Diagnosis not present

## 2023-12-23 DIAGNOSIS — E1165 Type 2 diabetes mellitus with hyperglycemia: Secondary | ICD-10-CM | POA: Diagnosis not present

## 2023-12-23 DIAGNOSIS — I5032 Chronic diastolic (congestive) heart failure: Secondary | ICD-10-CM | POA: Diagnosis not present

## 2023-12-23 DIAGNOSIS — S72011D Unspecified intracapsular fracture of right femur, subsequent encounter for closed fracture with routine healing: Secondary | ICD-10-CM | POA: Diagnosis not present

## 2023-12-23 DIAGNOSIS — M9701XD Periprosthetic fracture around internal prosthetic right hip joint, subsequent encounter: Secondary | ICD-10-CM | POA: Diagnosis not present

## 2023-12-23 DIAGNOSIS — N183 Chronic kidney disease, stage 3 unspecified: Secondary | ICD-10-CM | POA: Diagnosis not present

## 2023-12-23 DIAGNOSIS — Z794 Long term (current) use of insulin: Secondary | ICD-10-CM | POA: Diagnosis not present

## 2023-12-23 DIAGNOSIS — M069 Rheumatoid arthritis, unspecified: Secondary | ICD-10-CM | POA: Diagnosis not present

## 2023-12-23 DIAGNOSIS — Z6836 Body mass index (BMI) 36.0-36.9, adult: Secondary | ICD-10-CM | POA: Diagnosis not present

## 2023-12-23 DIAGNOSIS — I130 Hypertensive heart and chronic kidney disease with heart failure and stage 1 through stage 4 chronic kidney disease, or unspecified chronic kidney disease: Secondary | ICD-10-CM | POA: Diagnosis not present

## 2023-12-27 DIAGNOSIS — E1122 Type 2 diabetes mellitus with diabetic chronic kidney disease: Secondary | ICD-10-CM | POA: Diagnosis not present

## 2023-12-27 DIAGNOSIS — N183 Chronic kidney disease, stage 3 unspecified: Secondary | ICD-10-CM | POA: Diagnosis not present

## 2023-12-27 DIAGNOSIS — I5032 Chronic diastolic (congestive) heart failure: Secondary | ICD-10-CM | POA: Diagnosis not present

## 2023-12-27 DIAGNOSIS — S72011D Unspecified intracapsular fracture of right femur, subsequent encounter for closed fracture with routine healing: Secondary | ICD-10-CM | POA: Diagnosis not present

## 2023-12-27 DIAGNOSIS — M9701XD Periprosthetic fracture around internal prosthetic right hip joint, subsequent encounter: Secondary | ICD-10-CM | POA: Diagnosis not present

## 2023-12-27 DIAGNOSIS — I130 Hypertensive heart and chronic kidney disease with heart failure and stage 1 through stage 4 chronic kidney disease, or unspecified chronic kidney disease: Secondary | ICD-10-CM | POA: Diagnosis not present

## 2023-12-29 DIAGNOSIS — I130 Hypertensive heart and chronic kidney disease with heart failure and stage 1 through stage 4 chronic kidney disease, or unspecified chronic kidney disease: Secondary | ICD-10-CM | POA: Diagnosis not present

## 2023-12-29 DIAGNOSIS — S72011D Unspecified intracapsular fracture of right femur, subsequent encounter for closed fracture with routine healing: Secondary | ICD-10-CM | POA: Diagnosis not present

## 2023-12-29 DIAGNOSIS — E1122 Type 2 diabetes mellitus with diabetic chronic kidney disease: Secondary | ICD-10-CM | POA: Diagnosis not present

## 2023-12-29 DIAGNOSIS — M9701XD Periprosthetic fracture around internal prosthetic right hip joint, subsequent encounter: Secondary | ICD-10-CM | POA: Diagnosis not present

## 2023-12-29 DIAGNOSIS — N183 Chronic kidney disease, stage 3 unspecified: Secondary | ICD-10-CM | POA: Diagnosis not present

## 2023-12-29 DIAGNOSIS — I5032 Chronic diastolic (congestive) heart failure: Secondary | ICD-10-CM | POA: Diagnosis not present

## 2023-12-30 DIAGNOSIS — M9701XD Periprosthetic fracture around internal prosthetic right hip joint, subsequent encounter: Secondary | ICD-10-CM | POA: Diagnosis not present

## 2023-12-30 DIAGNOSIS — S72011D Unspecified intracapsular fracture of right femur, subsequent encounter for closed fracture with routine healing: Secondary | ICD-10-CM | POA: Diagnosis not present

## 2023-12-30 DIAGNOSIS — I130 Hypertensive heart and chronic kidney disease with heart failure and stage 1 through stage 4 chronic kidney disease, or unspecified chronic kidney disease: Secondary | ICD-10-CM | POA: Diagnosis not present

## 2023-12-30 DIAGNOSIS — I5032 Chronic diastolic (congestive) heart failure: Secondary | ICD-10-CM | POA: Diagnosis not present

## 2023-12-30 DIAGNOSIS — E1122 Type 2 diabetes mellitus with diabetic chronic kidney disease: Secondary | ICD-10-CM | POA: Diagnosis not present

## 2023-12-30 DIAGNOSIS — N183 Chronic kidney disease, stage 3 unspecified: Secondary | ICD-10-CM | POA: Diagnosis not present

## 2024-01-01 DIAGNOSIS — I5032 Chronic diastolic (congestive) heart failure: Secondary | ICD-10-CM | POA: Diagnosis not present

## 2024-01-01 DIAGNOSIS — E1122 Type 2 diabetes mellitus with diabetic chronic kidney disease: Secondary | ICD-10-CM | POA: Diagnosis not present

## 2024-01-01 DIAGNOSIS — N183 Chronic kidney disease, stage 3 unspecified: Secondary | ICD-10-CM | POA: Diagnosis not present

## 2024-01-01 DIAGNOSIS — I130 Hypertensive heart and chronic kidney disease with heart failure and stage 1 through stage 4 chronic kidney disease, or unspecified chronic kidney disease: Secondary | ICD-10-CM | POA: Diagnosis not present

## 2024-01-01 DIAGNOSIS — S72011D Unspecified intracapsular fracture of right femur, subsequent encounter for closed fracture with routine healing: Secondary | ICD-10-CM | POA: Diagnosis not present

## 2024-01-01 DIAGNOSIS — M9701XD Periprosthetic fracture around internal prosthetic right hip joint, subsequent encounter: Secondary | ICD-10-CM | POA: Diagnosis not present

## 2024-01-05 DIAGNOSIS — N183 Chronic kidney disease, stage 3 unspecified: Secondary | ICD-10-CM | POA: Diagnosis not present

## 2024-01-05 DIAGNOSIS — M9701XD Periprosthetic fracture around internal prosthetic right hip joint, subsequent encounter: Secondary | ICD-10-CM | POA: Diagnosis not present

## 2024-01-05 DIAGNOSIS — I5032 Chronic diastolic (congestive) heart failure: Secondary | ICD-10-CM | POA: Diagnosis not present

## 2024-01-05 DIAGNOSIS — I130 Hypertensive heart and chronic kidney disease with heart failure and stage 1 through stage 4 chronic kidney disease, or unspecified chronic kidney disease: Secondary | ICD-10-CM | POA: Diagnosis not present

## 2024-01-05 DIAGNOSIS — S72011D Unspecified intracapsular fracture of right femur, subsequent encounter for closed fracture with routine healing: Secondary | ICD-10-CM | POA: Diagnosis not present

## 2024-01-05 DIAGNOSIS — E1122 Type 2 diabetes mellitus with diabetic chronic kidney disease: Secondary | ICD-10-CM | POA: Diagnosis not present

## 2024-01-07 DIAGNOSIS — E1122 Type 2 diabetes mellitus with diabetic chronic kidney disease: Secondary | ICD-10-CM | POA: Diagnosis not present

## 2024-01-07 DIAGNOSIS — M9701XD Periprosthetic fracture around internal prosthetic right hip joint, subsequent encounter: Secondary | ICD-10-CM | POA: Diagnosis not present

## 2024-01-07 DIAGNOSIS — N183 Chronic kidney disease, stage 3 unspecified: Secondary | ICD-10-CM | POA: Diagnosis not present

## 2024-01-07 DIAGNOSIS — S72011D Unspecified intracapsular fracture of right femur, subsequent encounter for closed fracture with routine healing: Secondary | ICD-10-CM | POA: Diagnosis not present

## 2024-01-07 DIAGNOSIS — I130 Hypertensive heart and chronic kidney disease with heart failure and stage 1 through stage 4 chronic kidney disease, or unspecified chronic kidney disease: Secondary | ICD-10-CM | POA: Diagnosis not present

## 2024-01-07 DIAGNOSIS — I5032 Chronic diastolic (congestive) heart failure: Secondary | ICD-10-CM | POA: Diagnosis not present

## 2024-01-08 DIAGNOSIS — I130 Hypertensive heart and chronic kidney disease with heart failure and stage 1 through stage 4 chronic kidney disease, or unspecified chronic kidney disease: Secondary | ICD-10-CM | POA: Diagnosis not present

## 2024-01-08 DIAGNOSIS — I5032 Chronic diastolic (congestive) heart failure: Secondary | ICD-10-CM | POA: Diagnosis not present

## 2024-01-08 DIAGNOSIS — M9701XD Periprosthetic fracture around internal prosthetic right hip joint, subsequent encounter: Secondary | ICD-10-CM | POA: Diagnosis not present

## 2024-01-08 DIAGNOSIS — E1122 Type 2 diabetes mellitus with diabetic chronic kidney disease: Secondary | ICD-10-CM | POA: Diagnosis not present

## 2024-01-08 DIAGNOSIS — N183 Chronic kidney disease, stage 3 unspecified: Secondary | ICD-10-CM | POA: Diagnosis not present

## 2024-01-08 DIAGNOSIS — S72011D Unspecified intracapsular fracture of right femur, subsequent encounter for closed fracture with routine healing: Secondary | ICD-10-CM | POA: Diagnosis not present

## 2024-01-09 DIAGNOSIS — S72011D Unspecified intracapsular fracture of right femur, subsequent encounter for closed fracture with routine healing: Secondary | ICD-10-CM | POA: Diagnosis not present

## 2024-01-09 DIAGNOSIS — M9701XD Periprosthetic fracture around internal prosthetic right hip joint, subsequent encounter: Secondary | ICD-10-CM | POA: Diagnosis not present

## 2024-01-09 DIAGNOSIS — E1122 Type 2 diabetes mellitus with diabetic chronic kidney disease: Secondary | ICD-10-CM | POA: Diagnosis not present

## 2024-01-09 DIAGNOSIS — N183 Chronic kidney disease, stage 3 unspecified: Secondary | ICD-10-CM | POA: Diagnosis not present

## 2024-01-09 DIAGNOSIS — I130 Hypertensive heart and chronic kidney disease with heart failure and stage 1 through stage 4 chronic kidney disease, or unspecified chronic kidney disease: Secondary | ICD-10-CM | POA: Diagnosis not present

## 2024-01-09 DIAGNOSIS — I5032 Chronic diastolic (congestive) heart failure: Secondary | ICD-10-CM | POA: Diagnosis not present

## 2024-01-12 ENCOUNTER — Ambulatory Visit (HOSPITAL_BASED_OUTPATIENT_CLINIC_OR_DEPARTMENT_OTHER): Payer: Medicare Other | Admitting: Cardiology

## 2024-01-14 DIAGNOSIS — E1122 Type 2 diabetes mellitus with diabetic chronic kidney disease: Secondary | ICD-10-CM | POA: Diagnosis not present

## 2024-01-14 DIAGNOSIS — I130 Hypertensive heart and chronic kidney disease with heart failure and stage 1 through stage 4 chronic kidney disease, or unspecified chronic kidney disease: Secondary | ICD-10-CM | POA: Diagnosis not present

## 2024-01-14 DIAGNOSIS — S72011D Unspecified intracapsular fracture of right femur, subsequent encounter for closed fracture with routine healing: Secondary | ICD-10-CM | POA: Diagnosis not present

## 2024-01-14 DIAGNOSIS — N183 Chronic kidney disease, stage 3 unspecified: Secondary | ICD-10-CM | POA: Diagnosis not present

## 2024-01-14 DIAGNOSIS — I5032 Chronic diastolic (congestive) heart failure: Secondary | ICD-10-CM | POA: Diagnosis not present

## 2024-01-14 DIAGNOSIS — M9701XD Periprosthetic fracture around internal prosthetic right hip joint, subsequent encounter: Secondary | ICD-10-CM | POA: Diagnosis not present

## 2024-01-15 DIAGNOSIS — N183 Chronic kidney disease, stage 3 unspecified: Secondary | ICD-10-CM | POA: Diagnosis not present

## 2024-01-15 DIAGNOSIS — I130 Hypertensive heart and chronic kidney disease with heart failure and stage 1 through stage 4 chronic kidney disease, or unspecified chronic kidney disease: Secondary | ICD-10-CM | POA: Diagnosis not present

## 2024-01-15 DIAGNOSIS — E1122 Type 2 diabetes mellitus with diabetic chronic kidney disease: Secondary | ICD-10-CM | POA: Diagnosis not present

## 2024-01-15 DIAGNOSIS — I5032 Chronic diastolic (congestive) heart failure: Secondary | ICD-10-CM | POA: Diagnosis not present

## 2024-01-15 DIAGNOSIS — M9701XD Periprosthetic fracture around internal prosthetic right hip joint, subsequent encounter: Secondary | ICD-10-CM | POA: Diagnosis not present

## 2024-01-15 DIAGNOSIS — S72011D Unspecified intracapsular fracture of right femur, subsequent encounter for closed fracture with routine healing: Secondary | ICD-10-CM | POA: Diagnosis not present

## 2024-01-16 DIAGNOSIS — M9701XD Periprosthetic fracture around internal prosthetic right hip joint, subsequent encounter: Secondary | ICD-10-CM | POA: Diagnosis not present

## 2024-01-16 DIAGNOSIS — N183 Chronic kidney disease, stage 3 unspecified: Secondary | ICD-10-CM | POA: Diagnosis not present

## 2024-01-16 DIAGNOSIS — E1122 Type 2 diabetes mellitus with diabetic chronic kidney disease: Secondary | ICD-10-CM | POA: Diagnosis not present

## 2024-01-16 DIAGNOSIS — I5032 Chronic diastolic (congestive) heart failure: Secondary | ICD-10-CM | POA: Diagnosis not present

## 2024-01-16 DIAGNOSIS — I130 Hypertensive heart and chronic kidney disease with heart failure and stage 1 through stage 4 chronic kidney disease, or unspecified chronic kidney disease: Secondary | ICD-10-CM | POA: Diagnosis not present

## 2024-01-16 DIAGNOSIS — S72011D Unspecified intracapsular fracture of right femur, subsequent encounter for closed fracture with routine healing: Secondary | ICD-10-CM | POA: Diagnosis not present

## 2024-01-19 DIAGNOSIS — N183 Chronic kidney disease, stage 3 unspecified: Secondary | ICD-10-CM | POA: Diagnosis not present

## 2024-01-19 DIAGNOSIS — I130 Hypertensive heart and chronic kidney disease with heart failure and stage 1 through stage 4 chronic kidney disease, or unspecified chronic kidney disease: Secondary | ICD-10-CM | POA: Diagnosis not present

## 2024-01-19 DIAGNOSIS — E1122 Type 2 diabetes mellitus with diabetic chronic kidney disease: Secondary | ICD-10-CM | POA: Diagnosis not present

## 2024-01-19 DIAGNOSIS — I5032 Chronic diastolic (congestive) heart failure: Secondary | ICD-10-CM | POA: Diagnosis not present

## 2024-01-19 DIAGNOSIS — M9701XD Periprosthetic fracture around internal prosthetic right hip joint, subsequent encounter: Secondary | ICD-10-CM | POA: Diagnosis not present

## 2024-01-19 DIAGNOSIS — S72011D Unspecified intracapsular fracture of right femur, subsequent encounter for closed fracture with routine healing: Secondary | ICD-10-CM | POA: Diagnosis not present

## 2024-01-21 DIAGNOSIS — I5032 Chronic diastolic (congestive) heart failure: Secondary | ICD-10-CM | POA: Diagnosis not present

## 2024-01-21 DIAGNOSIS — N183 Chronic kidney disease, stage 3 unspecified: Secondary | ICD-10-CM | POA: Diagnosis not present

## 2024-01-21 DIAGNOSIS — E1122 Type 2 diabetes mellitus with diabetic chronic kidney disease: Secondary | ICD-10-CM | POA: Diagnosis not present

## 2024-01-21 DIAGNOSIS — I130 Hypertensive heart and chronic kidney disease with heart failure and stage 1 through stage 4 chronic kidney disease, or unspecified chronic kidney disease: Secondary | ICD-10-CM | POA: Diagnosis not present

## 2024-01-21 DIAGNOSIS — M9701XD Periprosthetic fracture around internal prosthetic right hip joint, subsequent encounter: Secondary | ICD-10-CM | POA: Diagnosis not present

## 2024-01-21 DIAGNOSIS — S72011D Unspecified intracapsular fracture of right femur, subsequent encounter for closed fracture with routine healing: Secondary | ICD-10-CM | POA: Diagnosis not present

## 2024-01-22 DIAGNOSIS — Z993 Dependence on wheelchair: Secondary | ICD-10-CM | POA: Diagnosis not present

## 2024-01-22 DIAGNOSIS — S72011D Unspecified intracapsular fracture of right femur, subsequent encounter for closed fracture with routine healing: Secondary | ICD-10-CM | POA: Diagnosis not present

## 2024-01-22 DIAGNOSIS — E785 Hyperlipidemia, unspecified: Secondary | ICD-10-CM | POA: Diagnosis not present

## 2024-01-22 DIAGNOSIS — N183 Chronic kidney disease, stage 3 unspecified: Secondary | ICD-10-CM | POA: Diagnosis not present

## 2024-01-22 DIAGNOSIS — E1122 Type 2 diabetes mellitus with diabetic chronic kidney disease: Secondary | ICD-10-CM | POA: Diagnosis not present

## 2024-01-22 DIAGNOSIS — Z6836 Body mass index (BMI) 36.0-36.9, adult: Secondary | ICD-10-CM | POA: Diagnosis not present

## 2024-01-22 DIAGNOSIS — Z96641 Presence of right artificial hip joint: Secondary | ICD-10-CM | POA: Diagnosis not present

## 2024-01-22 DIAGNOSIS — Z794 Long term (current) use of insulin: Secondary | ICD-10-CM | POA: Diagnosis not present

## 2024-01-22 DIAGNOSIS — I130 Hypertensive heart and chronic kidney disease with heart failure and stage 1 through stage 4 chronic kidney disease, or unspecified chronic kidney disease: Secondary | ICD-10-CM | POA: Diagnosis not present

## 2024-01-22 DIAGNOSIS — M9701XD Periprosthetic fracture around internal prosthetic right hip joint, subsequent encounter: Secondary | ICD-10-CM | POA: Diagnosis not present

## 2024-01-22 DIAGNOSIS — I480 Paroxysmal atrial fibrillation: Secondary | ICD-10-CM | POA: Diagnosis not present

## 2024-01-22 DIAGNOSIS — E1165 Type 2 diabetes mellitus with hyperglycemia: Secondary | ICD-10-CM | POA: Diagnosis not present

## 2024-01-22 DIAGNOSIS — Z9181 History of falling: Secondary | ICD-10-CM | POA: Diagnosis not present

## 2024-01-22 DIAGNOSIS — I4892 Unspecified atrial flutter: Secondary | ICD-10-CM | POA: Diagnosis not present

## 2024-01-22 DIAGNOSIS — I2510 Atherosclerotic heart disease of native coronary artery without angina pectoris: Secondary | ICD-10-CM | POA: Diagnosis not present

## 2024-01-22 DIAGNOSIS — I5032 Chronic diastolic (congestive) heart failure: Secondary | ICD-10-CM | POA: Diagnosis not present

## 2024-01-22 DIAGNOSIS — Z951 Presence of aortocoronary bypass graft: Secondary | ICD-10-CM | POA: Diagnosis not present

## 2024-01-22 DIAGNOSIS — Z556 Problems related to health literacy: Secondary | ICD-10-CM | POA: Diagnosis not present

## 2024-01-22 DIAGNOSIS — D631 Anemia in chronic kidney disease: Secondary | ICD-10-CM | POA: Diagnosis not present

## 2024-01-22 DIAGNOSIS — M069 Rheumatoid arthritis, unspecified: Secondary | ICD-10-CM | POA: Diagnosis not present

## 2024-01-22 DIAGNOSIS — L4050 Arthropathic psoriasis, unspecified: Secondary | ICD-10-CM | POA: Diagnosis not present

## 2024-01-26 DIAGNOSIS — N183 Chronic kidney disease, stage 3 unspecified: Secondary | ICD-10-CM | POA: Diagnosis not present

## 2024-01-26 DIAGNOSIS — I5032 Chronic diastolic (congestive) heart failure: Secondary | ICD-10-CM | POA: Diagnosis not present

## 2024-01-26 DIAGNOSIS — E1122 Type 2 diabetes mellitus with diabetic chronic kidney disease: Secondary | ICD-10-CM | POA: Diagnosis not present

## 2024-01-26 DIAGNOSIS — I130 Hypertensive heart and chronic kidney disease with heart failure and stage 1 through stage 4 chronic kidney disease, or unspecified chronic kidney disease: Secondary | ICD-10-CM | POA: Diagnosis not present

## 2024-01-26 DIAGNOSIS — M9701XD Periprosthetic fracture around internal prosthetic right hip joint, subsequent encounter: Secondary | ICD-10-CM | POA: Diagnosis not present

## 2024-01-26 DIAGNOSIS — S72011D Unspecified intracapsular fracture of right femur, subsequent encounter for closed fracture with routine healing: Secondary | ICD-10-CM | POA: Diagnosis not present

## 2024-01-28 DIAGNOSIS — S72011D Unspecified intracapsular fracture of right femur, subsequent encounter for closed fracture with routine healing: Secondary | ICD-10-CM | POA: Diagnosis not present

## 2024-01-28 DIAGNOSIS — N183 Chronic kidney disease, stage 3 unspecified: Secondary | ICD-10-CM | POA: Diagnosis not present

## 2024-01-28 DIAGNOSIS — E1122 Type 2 diabetes mellitus with diabetic chronic kidney disease: Secondary | ICD-10-CM | POA: Diagnosis not present

## 2024-01-28 DIAGNOSIS — I130 Hypertensive heart and chronic kidney disease with heart failure and stage 1 through stage 4 chronic kidney disease, or unspecified chronic kidney disease: Secondary | ICD-10-CM | POA: Diagnosis not present

## 2024-01-28 DIAGNOSIS — M9701XD Periprosthetic fracture around internal prosthetic right hip joint, subsequent encounter: Secondary | ICD-10-CM | POA: Diagnosis not present

## 2024-01-28 DIAGNOSIS — I5032 Chronic diastolic (congestive) heart failure: Secondary | ICD-10-CM | POA: Diagnosis not present

## 2024-01-29 DIAGNOSIS — N183 Chronic kidney disease, stage 3 unspecified: Secondary | ICD-10-CM | POA: Diagnosis not present

## 2024-01-29 DIAGNOSIS — S72011D Unspecified intracapsular fracture of right femur, subsequent encounter for closed fracture with routine healing: Secondary | ICD-10-CM | POA: Diagnosis not present

## 2024-01-29 DIAGNOSIS — M9701XD Periprosthetic fracture around internal prosthetic right hip joint, subsequent encounter: Secondary | ICD-10-CM | POA: Diagnosis not present

## 2024-01-29 DIAGNOSIS — I5032 Chronic diastolic (congestive) heart failure: Secondary | ICD-10-CM | POA: Diagnosis not present

## 2024-01-29 DIAGNOSIS — E1122 Type 2 diabetes mellitus with diabetic chronic kidney disease: Secondary | ICD-10-CM | POA: Diagnosis not present

## 2024-01-29 DIAGNOSIS — I130 Hypertensive heart and chronic kidney disease with heart failure and stage 1 through stage 4 chronic kidney disease, or unspecified chronic kidney disease: Secondary | ICD-10-CM | POA: Diagnosis not present

## 2024-02-04 DIAGNOSIS — I5032 Chronic diastolic (congestive) heart failure: Secondary | ICD-10-CM | POA: Diagnosis not present

## 2024-02-04 DIAGNOSIS — S72011D Unspecified intracapsular fracture of right femur, subsequent encounter for closed fracture with routine healing: Secondary | ICD-10-CM | POA: Diagnosis not present

## 2024-02-04 DIAGNOSIS — I130 Hypertensive heart and chronic kidney disease with heart failure and stage 1 through stage 4 chronic kidney disease, or unspecified chronic kidney disease: Secondary | ICD-10-CM | POA: Diagnosis not present

## 2024-02-04 DIAGNOSIS — E1122 Type 2 diabetes mellitus with diabetic chronic kidney disease: Secondary | ICD-10-CM | POA: Diagnosis not present

## 2024-02-04 DIAGNOSIS — M9701XD Periprosthetic fracture around internal prosthetic right hip joint, subsequent encounter: Secondary | ICD-10-CM | POA: Diagnosis not present

## 2024-02-04 DIAGNOSIS — N183 Chronic kidney disease, stage 3 unspecified: Secondary | ICD-10-CM | POA: Diagnosis not present

## 2024-02-06 DIAGNOSIS — S72011D Unspecified intracapsular fracture of right femur, subsequent encounter for closed fracture with routine healing: Secondary | ICD-10-CM | POA: Diagnosis not present

## 2024-02-06 DIAGNOSIS — N183 Chronic kidney disease, stage 3 unspecified: Secondary | ICD-10-CM | POA: Diagnosis not present

## 2024-02-06 DIAGNOSIS — I5032 Chronic diastolic (congestive) heart failure: Secondary | ICD-10-CM | POA: Diagnosis not present

## 2024-02-06 DIAGNOSIS — E1122 Type 2 diabetes mellitus with diabetic chronic kidney disease: Secondary | ICD-10-CM | POA: Diagnosis not present

## 2024-02-06 DIAGNOSIS — I130 Hypertensive heart and chronic kidney disease with heart failure and stage 1 through stage 4 chronic kidney disease, or unspecified chronic kidney disease: Secondary | ICD-10-CM | POA: Diagnosis not present

## 2024-02-06 DIAGNOSIS — M9701XD Periprosthetic fracture around internal prosthetic right hip joint, subsequent encounter: Secondary | ICD-10-CM | POA: Diagnosis not present

## 2024-02-11 DIAGNOSIS — M9701XD Periprosthetic fracture around internal prosthetic right hip joint, subsequent encounter: Secondary | ICD-10-CM | POA: Diagnosis not present

## 2024-02-11 DIAGNOSIS — N183 Chronic kidney disease, stage 3 unspecified: Secondary | ICD-10-CM | POA: Diagnosis not present

## 2024-02-11 DIAGNOSIS — E1122 Type 2 diabetes mellitus with diabetic chronic kidney disease: Secondary | ICD-10-CM | POA: Diagnosis not present

## 2024-02-11 DIAGNOSIS — I130 Hypertensive heart and chronic kidney disease with heart failure and stage 1 through stage 4 chronic kidney disease, or unspecified chronic kidney disease: Secondary | ICD-10-CM | POA: Diagnosis not present

## 2024-02-11 DIAGNOSIS — S72011D Unspecified intracapsular fracture of right femur, subsequent encounter for closed fracture with routine healing: Secondary | ICD-10-CM | POA: Diagnosis not present

## 2024-02-11 DIAGNOSIS — I5032 Chronic diastolic (congestive) heart failure: Secondary | ICD-10-CM | POA: Diagnosis not present

## 2024-02-12 DIAGNOSIS — E1122 Type 2 diabetes mellitus with diabetic chronic kidney disease: Secondary | ICD-10-CM | POA: Diagnosis not present

## 2024-02-12 DIAGNOSIS — N183 Chronic kidney disease, stage 3 unspecified: Secondary | ICD-10-CM | POA: Diagnosis not present

## 2024-02-12 DIAGNOSIS — M9701XD Periprosthetic fracture around internal prosthetic right hip joint, subsequent encounter: Secondary | ICD-10-CM | POA: Diagnosis not present

## 2024-02-12 DIAGNOSIS — I5032 Chronic diastolic (congestive) heart failure: Secondary | ICD-10-CM | POA: Diagnosis not present

## 2024-02-12 DIAGNOSIS — S72011D Unspecified intracapsular fracture of right femur, subsequent encounter for closed fracture with routine healing: Secondary | ICD-10-CM | POA: Diagnosis not present

## 2024-02-12 DIAGNOSIS — I130 Hypertensive heart and chronic kidney disease with heart failure and stage 1 through stage 4 chronic kidney disease, or unspecified chronic kidney disease: Secondary | ICD-10-CM | POA: Diagnosis not present

## 2024-02-14 DIAGNOSIS — S72011D Unspecified intracapsular fracture of right femur, subsequent encounter for closed fracture with routine healing: Secondary | ICD-10-CM | POA: Diagnosis not present

## 2024-02-14 DIAGNOSIS — N183 Chronic kidney disease, stage 3 unspecified: Secondary | ICD-10-CM | POA: Diagnosis not present

## 2024-02-14 DIAGNOSIS — I5032 Chronic diastolic (congestive) heart failure: Secondary | ICD-10-CM | POA: Diagnosis not present

## 2024-02-14 DIAGNOSIS — M9701XD Periprosthetic fracture around internal prosthetic right hip joint, subsequent encounter: Secondary | ICD-10-CM | POA: Diagnosis not present

## 2024-02-14 DIAGNOSIS — I130 Hypertensive heart and chronic kidney disease with heart failure and stage 1 through stage 4 chronic kidney disease, or unspecified chronic kidney disease: Secondary | ICD-10-CM | POA: Diagnosis not present

## 2024-02-14 DIAGNOSIS — E1122 Type 2 diabetes mellitus with diabetic chronic kidney disease: Secondary | ICD-10-CM | POA: Diagnosis not present

## 2024-02-15 DIAGNOSIS — N183 Chronic kidney disease, stage 3 unspecified: Secondary | ICD-10-CM | POA: Diagnosis not present

## 2024-02-15 DIAGNOSIS — M9701XD Periprosthetic fracture around internal prosthetic right hip joint, subsequent encounter: Secondary | ICD-10-CM | POA: Diagnosis not present

## 2024-02-15 DIAGNOSIS — I5032 Chronic diastolic (congestive) heart failure: Secondary | ICD-10-CM | POA: Diagnosis not present

## 2024-02-15 DIAGNOSIS — E1122 Type 2 diabetes mellitus with diabetic chronic kidney disease: Secondary | ICD-10-CM | POA: Diagnosis not present

## 2024-02-15 DIAGNOSIS — I130 Hypertensive heart and chronic kidney disease with heart failure and stage 1 through stage 4 chronic kidney disease, or unspecified chronic kidney disease: Secondary | ICD-10-CM | POA: Diagnosis not present

## 2024-02-15 DIAGNOSIS — S72011D Unspecified intracapsular fracture of right femur, subsequent encounter for closed fracture with routine healing: Secondary | ICD-10-CM | POA: Diagnosis not present

## 2024-02-17 DIAGNOSIS — M9701XD Periprosthetic fracture around internal prosthetic right hip joint, subsequent encounter: Secondary | ICD-10-CM | POA: Diagnosis not present

## 2024-02-17 DIAGNOSIS — I130 Hypertensive heart and chronic kidney disease with heart failure and stage 1 through stage 4 chronic kidney disease, or unspecified chronic kidney disease: Secondary | ICD-10-CM | POA: Diagnosis not present

## 2024-02-17 DIAGNOSIS — E1122 Type 2 diabetes mellitus with diabetic chronic kidney disease: Secondary | ICD-10-CM | POA: Diagnosis not present

## 2024-02-17 DIAGNOSIS — I5032 Chronic diastolic (congestive) heart failure: Secondary | ICD-10-CM | POA: Diagnosis not present

## 2024-02-17 DIAGNOSIS — S72011D Unspecified intracapsular fracture of right femur, subsequent encounter for closed fracture with routine healing: Secondary | ICD-10-CM | POA: Diagnosis not present

## 2024-02-17 DIAGNOSIS — N183 Chronic kidney disease, stage 3 unspecified: Secondary | ICD-10-CM | POA: Diagnosis not present

## 2024-02-18 ENCOUNTER — Ambulatory Visit (HOSPITAL_BASED_OUTPATIENT_CLINIC_OR_DEPARTMENT_OTHER): Payer: Medicare Other | Admitting: Cardiology

## 2024-02-18 ENCOUNTER — Other Ambulatory Visit: Payer: Self-pay | Admitting: Internal Medicine

## 2024-02-18 DIAGNOSIS — M9701XD Periprosthetic fracture around internal prosthetic right hip joint, subsequent encounter: Secondary | ICD-10-CM | POA: Diagnosis not present

## 2024-02-18 DIAGNOSIS — I5032 Chronic diastolic (congestive) heart failure: Secondary | ICD-10-CM | POA: Diagnosis not present

## 2024-02-18 DIAGNOSIS — I130 Hypertensive heart and chronic kidney disease with heart failure and stage 1 through stage 4 chronic kidney disease, or unspecified chronic kidney disease: Secondary | ICD-10-CM | POA: Diagnosis not present

## 2024-02-18 DIAGNOSIS — E1122 Type 2 diabetes mellitus with diabetic chronic kidney disease: Secondary | ICD-10-CM | POA: Diagnosis not present

## 2024-02-18 DIAGNOSIS — N183 Chronic kidney disease, stage 3 unspecified: Secondary | ICD-10-CM | POA: Diagnosis not present

## 2024-02-18 DIAGNOSIS — S72011D Unspecified intracapsular fracture of right femur, subsequent encounter for closed fracture with routine healing: Secondary | ICD-10-CM | POA: Diagnosis not present

## 2024-02-21 DIAGNOSIS — Z7901 Long term (current) use of anticoagulants: Secondary | ICD-10-CM | POA: Diagnosis not present

## 2024-02-21 DIAGNOSIS — I2510 Atherosclerotic heart disease of native coronary artery without angina pectoris: Secondary | ICD-10-CM | POA: Diagnosis not present

## 2024-02-21 DIAGNOSIS — N183 Chronic kidney disease, stage 3 unspecified: Secondary | ICD-10-CM | POA: Diagnosis not present

## 2024-02-21 DIAGNOSIS — Z6836 Body mass index (BMI) 36.0-36.9, adult: Secondary | ICD-10-CM | POA: Diagnosis not present

## 2024-02-21 DIAGNOSIS — I4892 Unspecified atrial flutter: Secondary | ICD-10-CM | POA: Diagnosis not present

## 2024-02-21 DIAGNOSIS — Z951 Presence of aortocoronary bypass graft: Secondary | ICD-10-CM | POA: Diagnosis not present

## 2024-02-21 DIAGNOSIS — Z96641 Presence of right artificial hip joint: Secondary | ICD-10-CM | POA: Diagnosis not present

## 2024-02-21 DIAGNOSIS — S72011D Unspecified intracapsular fracture of right femur, subsequent encounter for closed fracture with routine healing: Secondary | ICD-10-CM | POA: Diagnosis not present

## 2024-02-21 DIAGNOSIS — M9701XD Periprosthetic fracture around internal prosthetic right hip joint, subsequent encounter: Secondary | ICD-10-CM | POA: Diagnosis not present

## 2024-02-21 DIAGNOSIS — M069 Rheumatoid arthritis, unspecified: Secondary | ICD-10-CM | POA: Diagnosis not present

## 2024-02-21 DIAGNOSIS — L4050 Arthropathic psoriasis, unspecified: Secondary | ICD-10-CM | POA: Diagnosis not present

## 2024-02-21 DIAGNOSIS — Z9181 History of falling: Secondary | ICD-10-CM | POA: Diagnosis not present

## 2024-02-21 DIAGNOSIS — Z556 Problems related to health literacy: Secondary | ICD-10-CM | POA: Diagnosis not present

## 2024-02-21 DIAGNOSIS — I5032 Chronic diastolic (congestive) heart failure: Secondary | ICD-10-CM | POA: Diagnosis not present

## 2024-02-21 DIAGNOSIS — I480 Paroxysmal atrial fibrillation: Secondary | ICD-10-CM | POA: Diagnosis not present

## 2024-02-21 DIAGNOSIS — E1122 Type 2 diabetes mellitus with diabetic chronic kidney disease: Secondary | ICD-10-CM | POA: Diagnosis not present

## 2024-02-21 DIAGNOSIS — E785 Hyperlipidemia, unspecified: Secondary | ICD-10-CM | POA: Diagnosis not present

## 2024-02-21 DIAGNOSIS — Z794 Long term (current) use of insulin: Secondary | ICD-10-CM | POA: Diagnosis not present

## 2024-02-21 DIAGNOSIS — D631 Anemia in chronic kidney disease: Secondary | ICD-10-CM | POA: Diagnosis not present

## 2024-02-21 DIAGNOSIS — I130 Hypertensive heart and chronic kidney disease with heart failure and stage 1 through stage 4 chronic kidney disease, or unspecified chronic kidney disease: Secondary | ICD-10-CM | POA: Diagnosis not present

## 2024-02-27 DIAGNOSIS — N183 Chronic kidney disease, stage 3 unspecified: Secondary | ICD-10-CM | POA: Diagnosis not present

## 2024-02-27 DIAGNOSIS — M9701XD Periprosthetic fracture around internal prosthetic right hip joint, subsequent encounter: Secondary | ICD-10-CM | POA: Diagnosis not present

## 2024-02-27 DIAGNOSIS — I130 Hypertensive heart and chronic kidney disease with heart failure and stage 1 through stage 4 chronic kidney disease, or unspecified chronic kidney disease: Secondary | ICD-10-CM | POA: Diagnosis not present

## 2024-02-27 DIAGNOSIS — E1122 Type 2 diabetes mellitus with diabetic chronic kidney disease: Secondary | ICD-10-CM | POA: Diagnosis not present

## 2024-02-27 DIAGNOSIS — I5032 Chronic diastolic (congestive) heart failure: Secondary | ICD-10-CM | POA: Diagnosis not present

## 2024-02-27 DIAGNOSIS — S72011D Unspecified intracapsular fracture of right femur, subsequent encounter for closed fracture with routine healing: Secondary | ICD-10-CM | POA: Diagnosis not present

## 2024-03-04 DIAGNOSIS — N183 Chronic kidney disease, stage 3 unspecified: Secondary | ICD-10-CM | POA: Diagnosis not present

## 2024-03-04 DIAGNOSIS — I5032 Chronic diastolic (congestive) heart failure: Secondary | ICD-10-CM | POA: Diagnosis not present

## 2024-03-04 DIAGNOSIS — M9701XD Periprosthetic fracture around internal prosthetic right hip joint, subsequent encounter: Secondary | ICD-10-CM | POA: Diagnosis not present

## 2024-03-04 DIAGNOSIS — I130 Hypertensive heart and chronic kidney disease with heart failure and stage 1 through stage 4 chronic kidney disease, or unspecified chronic kidney disease: Secondary | ICD-10-CM | POA: Diagnosis not present

## 2024-03-04 DIAGNOSIS — S72011D Unspecified intracapsular fracture of right femur, subsequent encounter for closed fracture with routine healing: Secondary | ICD-10-CM | POA: Diagnosis not present

## 2024-03-04 DIAGNOSIS — E1122 Type 2 diabetes mellitus with diabetic chronic kidney disease: Secondary | ICD-10-CM | POA: Diagnosis not present

## 2024-03-10 ENCOUNTER — Other Ambulatory Visit: Payer: Self-pay | Admitting: Cardiology

## 2024-03-10 DIAGNOSIS — E1122 Type 2 diabetes mellitus with diabetic chronic kidney disease: Secondary | ICD-10-CM | POA: Diagnosis not present

## 2024-03-10 DIAGNOSIS — I130 Hypertensive heart and chronic kidney disease with heart failure and stage 1 through stage 4 chronic kidney disease, or unspecified chronic kidney disease: Secondary | ICD-10-CM | POA: Diagnosis not present

## 2024-03-10 DIAGNOSIS — S72011D Unspecified intracapsular fracture of right femur, subsequent encounter for closed fracture with routine healing: Secondary | ICD-10-CM | POA: Diagnosis not present

## 2024-03-10 DIAGNOSIS — N183 Chronic kidney disease, stage 3 unspecified: Secondary | ICD-10-CM | POA: Diagnosis not present

## 2024-03-10 DIAGNOSIS — I5032 Chronic diastolic (congestive) heart failure: Secondary | ICD-10-CM | POA: Diagnosis not present

## 2024-03-10 DIAGNOSIS — M9701XD Periprosthetic fracture around internal prosthetic right hip joint, subsequent encounter: Secondary | ICD-10-CM | POA: Diagnosis not present

## 2024-03-17 DIAGNOSIS — S72011D Unspecified intracapsular fracture of right femur, subsequent encounter for closed fracture with routine healing: Secondary | ICD-10-CM | POA: Diagnosis not present

## 2024-03-17 DIAGNOSIS — I5032 Chronic diastolic (congestive) heart failure: Secondary | ICD-10-CM | POA: Diagnosis not present

## 2024-03-17 DIAGNOSIS — I130 Hypertensive heart and chronic kidney disease with heart failure and stage 1 through stage 4 chronic kidney disease, or unspecified chronic kidney disease: Secondary | ICD-10-CM | POA: Diagnosis not present

## 2024-03-17 DIAGNOSIS — M9701XD Periprosthetic fracture around internal prosthetic right hip joint, subsequent encounter: Secondary | ICD-10-CM | POA: Diagnosis not present

## 2024-03-17 DIAGNOSIS — N183 Chronic kidney disease, stage 3 unspecified: Secondary | ICD-10-CM | POA: Diagnosis not present

## 2024-03-17 DIAGNOSIS — E1122 Type 2 diabetes mellitus with diabetic chronic kidney disease: Secondary | ICD-10-CM | POA: Diagnosis not present

## 2024-03-19 DIAGNOSIS — S72011D Unspecified intracapsular fracture of right femur, subsequent encounter for closed fracture with routine healing: Secondary | ICD-10-CM | POA: Diagnosis not present

## 2024-03-19 DIAGNOSIS — I5032 Chronic diastolic (congestive) heart failure: Secondary | ICD-10-CM | POA: Diagnosis not present

## 2024-03-19 DIAGNOSIS — M9701XD Periprosthetic fracture around internal prosthetic right hip joint, subsequent encounter: Secondary | ICD-10-CM | POA: Diagnosis not present

## 2024-03-19 DIAGNOSIS — N183 Chronic kidney disease, stage 3 unspecified: Secondary | ICD-10-CM | POA: Diagnosis not present

## 2024-03-19 DIAGNOSIS — E1122 Type 2 diabetes mellitus with diabetic chronic kidney disease: Secondary | ICD-10-CM | POA: Diagnosis not present

## 2024-03-19 DIAGNOSIS — I130 Hypertensive heart and chronic kidney disease with heart failure and stage 1 through stage 4 chronic kidney disease, or unspecified chronic kidney disease: Secondary | ICD-10-CM | POA: Diagnosis not present

## 2024-03-22 DIAGNOSIS — I4892 Unspecified atrial flutter: Secondary | ICD-10-CM | POA: Diagnosis not present

## 2024-03-22 DIAGNOSIS — D631 Anemia in chronic kidney disease: Secondary | ICD-10-CM | POA: Diagnosis not present

## 2024-03-22 DIAGNOSIS — I2510 Atherosclerotic heart disease of native coronary artery without angina pectoris: Secondary | ICD-10-CM | POA: Diagnosis not present

## 2024-03-22 DIAGNOSIS — N183 Chronic kidney disease, stage 3 unspecified: Secondary | ICD-10-CM | POA: Diagnosis not present

## 2024-03-22 DIAGNOSIS — M9701XD Periprosthetic fracture around internal prosthetic right hip joint, subsequent encounter: Secondary | ICD-10-CM | POA: Diagnosis not present

## 2024-03-22 DIAGNOSIS — E1122 Type 2 diabetes mellitus with diabetic chronic kidney disease: Secondary | ICD-10-CM | POA: Diagnosis not present

## 2024-03-22 DIAGNOSIS — E785 Hyperlipidemia, unspecified: Secondary | ICD-10-CM | POA: Diagnosis not present

## 2024-03-22 DIAGNOSIS — Z6836 Body mass index (BMI) 36.0-36.9, adult: Secondary | ICD-10-CM | POA: Diagnosis not present

## 2024-03-22 DIAGNOSIS — I5032 Chronic diastolic (congestive) heart failure: Secondary | ICD-10-CM | POA: Diagnosis not present

## 2024-03-22 DIAGNOSIS — I130 Hypertensive heart and chronic kidney disease with heart failure and stage 1 through stage 4 chronic kidney disease, or unspecified chronic kidney disease: Secondary | ICD-10-CM | POA: Diagnosis not present

## 2024-03-22 DIAGNOSIS — Z794 Long term (current) use of insulin: Secondary | ICD-10-CM | POA: Diagnosis not present

## 2024-03-22 DIAGNOSIS — Z96641 Presence of right artificial hip joint: Secondary | ICD-10-CM | POA: Diagnosis not present

## 2024-03-22 DIAGNOSIS — Z9181 History of falling: Secondary | ICD-10-CM | POA: Diagnosis not present

## 2024-03-22 DIAGNOSIS — L4050 Arthropathic psoriasis, unspecified: Secondary | ICD-10-CM | POA: Diagnosis not present

## 2024-03-22 DIAGNOSIS — Z556 Problems related to health literacy: Secondary | ICD-10-CM | POA: Diagnosis not present

## 2024-03-22 DIAGNOSIS — I480 Paroxysmal atrial fibrillation: Secondary | ICD-10-CM | POA: Diagnosis not present

## 2024-03-22 DIAGNOSIS — Z951 Presence of aortocoronary bypass graft: Secondary | ICD-10-CM | POA: Diagnosis not present

## 2024-03-22 DIAGNOSIS — S72011D Unspecified intracapsular fracture of right femur, subsequent encounter for closed fracture with routine healing: Secondary | ICD-10-CM | POA: Diagnosis not present

## 2024-03-22 DIAGNOSIS — M069 Rheumatoid arthritis, unspecified: Secondary | ICD-10-CM | POA: Diagnosis not present

## 2024-03-22 DIAGNOSIS — Z7901 Long term (current) use of anticoagulants: Secondary | ICD-10-CM | POA: Diagnosis not present

## 2024-03-23 DIAGNOSIS — L600 Ingrowing nail: Secondary | ICD-10-CM | POA: Diagnosis not present

## 2024-03-23 DIAGNOSIS — M79675 Pain in left toe(s): Secondary | ICD-10-CM | POA: Diagnosis not present

## 2024-03-23 DIAGNOSIS — M7061 Trochanteric bursitis, right hip: Secondary | ICD-10-CM | POA: Diagnosis not present

## 2024-03-23 DIAGNOSIS — M1611 Unilateral primary osteoarthritis, right hip: Secondary | ICD-10-CM | POA: Diagnosis not present

## 2024-03-24 ENCOUNTER — Ambulatory Visit (HOSPITAL_BASED_OUTPATIENT_CLINIC_OR_DEPARTMENT_OTHER): Payer: Medicare Other | Admitting: Cardiology

## 2024-03-24 ENCOUNTER — Encounter (HOSPITAL_BASED_OUTPATIENT_CLINIC_OR_DEPARTMENT_OTHER): Payer: Self-pay | Admitting: Cardiology

## 2024-03-24 VITALS — BP 124/58 | HR 62 | Ht 60.0 in

## 2024-03-24 DIAGNOSIS — I480 Paroxysmal atrial fibrillation: Secondary | ICD-10-CM

## 2024-03-24 DIAGNOSIS — R079 Chest pain, unspecified: Secondary | ICD-10-CM | POA: Diagnosis not present

## 2024-03-24 DIAGNOSIS — I25700 Atherosclerosis of coronary artery bypass graft(s), unspecified, with unstable angina pectoris: Secondary | ICD-10-CM | POA: Diagnosis not present

## 2024-03-24 DIAGNOSIS — I5032 Chronic diastolic (congestive) heart failure: Secondary | ICD-10-CM

## 2024-03-24 DIAGNOSIS — I10 Essential (primary) hypertension: Secondary | ICD-10-CM

## 2024-03-24 DIAGNOSIS — Z7901 Long term (current) use of anticoagulants: Secondary | ICD-10-CM | POA: Diagnosis not present

## 2024-03-24 DIAGNOSIS — D6869 Other thrombophilia: Secondary | ICD-10-CM

## 2024-03-24 MED ORDER — NITROGLYCERIN 0.4 MG SL SUBL: 0.4000 mg | SUBLINGUAL_TABLET | SUBLINGUAL | 3 refills | Status: AC | PRN

## 2024-03-24 MED ORDER — CARVEDILOL 6.25 MG PO TABS: 6.2500 mg | ORAL_TABLET | Freq: Two times a day (BID) | ORAL | 3 refills | Status: AC

## 2024-03-24 MED ORDER — SPIRONOLACTONE 25 MG PO TABS: 12.5000 mg | ORAL_TABLET | Freq: Every day | ORAL | 3 refills | Status: AC

## 2024-03-24 MED ORDER — ISOSORBIDE MONONITRATE ER 30 MG PO TB24: 15.0000 mg | ORAL_TABLET | Freq: Every day | ORAL | 3 refills | Status: AC

## 2024-03-24 MED ORDER — RAMIPRIL 5 MG PO CAPS: ORAL_CAPSULE | ORAL | 3 refills | Status: AC

## 2024-03-24 NOTE — Patient Instructions (Addendum)
 Medication Instructions:  Your physician recommends that you continue on your current medications as directed. Please refer to the Current Medication list given to you today.  If you notice that the chest discomfort gets more severe, more frequent, or longer lasting, let me know.  Look into [employer] for fitted compression stockings: [address] [place], [place], [place] [address] [phone] [email or web address]   Follow-Up: At [facility], you and your health needs are our priority.  As part of our continuing mission to provide you with exceptional heart care, we have created designated Provider Care Teams.  These Care Teams include your primary Cardiologist (physician) and Advanced Practice Providers (APPs -  Physician Assistants and Nurse Practitioners) who all work together to provide you with the care you need, when you need it.  We recommend signing up for the patient portal called "MyChart".  Sign up information is provided on this After Visit Summary.  MyChart is used to connect with patients for Virtual Visits (Telemedicine).  Patients are able to view lab/test results, encounter notes, upcoming appointments, etc.  Non-urgent messages can be sent to your provider as well.   To learn more about what you can do with MyChart, go to [email or web address].     Your next appointment:   6 month(s)  Provider:   Jodelle Red, MD, Eligha Bridegroom, NP or Gillian Shields, NP

## 2024-03-24 NOTE — Progress Notes (Signed)
 Cardiology Office Note:  .   Date:  03/24/2024  ID:  [name], DOB November 25, 1934, MRN [id number] PCP: April Aspen, MD  [facility] Providers Cardiologist:  April Red, MD {  History of Present Illness: .   [name] is a 89 y.o. female with PMH CAD s/p CABG on 2006, chronic diastolic heart failure, paroxysmal atrial fibrillation, type II diabetes, hypertension, CVA. She was previously followed by Dr. Katrinka [name] and Dr. Shari [name] and established care with me on 03/24/24.  Today: Underwent cardiac PET in 07/2023, which showed small area of apical lateral ischemia (intermediate risk). Unfortunately she had a hip fracture from a fall in 09/2023; she underwent surgery without cardiovascular complications.  Has had a long recovery form hip issues. Has had swelling R >L stably since hip surgery on the right.  Has intermittent central chest discomfort, usually in the morning after eating breakfast. No nausea/vomiting/diaphoresis. Self resolves in about 15 minutes. Has not become more frequent. Also notices that she cannot swallow large pills. No exertional or positional component. Reviewed potential etiologies, results of PET, [name] flag signs.  Had one episode of syncope while in SNF, none since. Has not felt arrhythmia. No bleeding issues.   ROS: Denies shortness of breath at rest or with normal exertion. No PND, orthopnea, or unexpected weight gain. No palpitations. ROS otherwise negative except as noted.   Studies Reviewed: Marland Kitchen    EKG:       Physical Exam:   VS:  BP (!) 124/58   Pulse 62   Ht 5' (1.524 m)   SpO2 100%   BMI 37.33 kg/m    Wt Readings from Last 3 Encounters:  10/19/23 191 lb 2.2 oz (86.7 kg)  10/09/23 195 lb 1.7 oz (88.5 kg)  05/20/23 195 lb 3.2 oz (88.5 kg)    GEN: Well nourished, well developed in no acute distress HEENT: Normal, moist mucous membranes NECK: No JVD CARDIAC: regular rhythm, normal S1 and S2, no rubs or gallops. No  murmur. VASCULAR: Radial and DP pulses 2+ bilaterally. No carotid bruits RESPIRATORY:  Clear to auscultation without rales, wheezing or rhonchi  ABDOMEN: Soft, non-tender, non-distended MUSCULOSKELETAL:  Ambulates independently SKIN: Warm and dry, RLE with trace to 1+ edema, trivial on left NEUROLOGIC:  Alert and oriented x 3. No focal neuro deficits noted. PSYCHIATRIC:  Normal affect    ASSESSMENT AND PLAN: .    CAD s/p CABG in 2006 Hypercholesterolemia Chest discomfort, likely noncardiac -chest pain is nonexertional, after eating, mild, nonlimiting. Suspect GI, but reviewed [name] flags that need further evaluation. -continue rosuvastatin 10 mg daily. Last LDL  -no aspirin as she is on dabigatran -continue carvedilol 6.25 mg BID, imdur 15 mg daily as anti anginals -cardiac PET with small area of possible ischemia, overall intermediate risk -has not required nitroglycerin, but bottles are old, refilled today -reviewed [name] flag warning signs that need immediate medical attention  Chronic diastolic heart failure -lungs clear, no JVD, suggests euvolemic -has RLE edema since hip surgery. Recommended elevation, compression stockings -continue spironolactone 12.5 mg daily  Paroxysmal atrial fibrillation -CHA2DS2/VAS Stroke Risk Points=9 -continue pradaxa, denies bleeding -on carvedilol as above  Hypertension -on spironolactone, carvedilol, imdur as above -continue ramipril 5 mg BID  Type II diabetes -on insulin  Dispo: 6 mos or sooner as needed  Total time of encounter: I spent 48 minutes dedicated to the care of this patient on the date of this encounter to include pre-visit review of records, face-to-face time  with the patient discussing conditions above, and clinical documentation with the electronic health record. We specifically spent time today discussing symptoms, potential etiologies of chest pain and leg swelling, management strategies, [name] flags to watch for.    Signed, April Red, MD   April Red, MD, PhD, [facility] [place]  [facility]  [place]  Heart & Vascular at [facility] [address], [address] [place], [place] [address] [phone]

## 2024-03-25 DIAGNOSIS — E1122 Type 2 diabetes mellitus with diabetic chronic kidney disease: Secondary | ICD-10-CM | POA: Diagnosis not present

## 2024-03-25 DIAGNOSIS — I5032 Chronic diastolic (congestive) heart failure: Secondary | ICD-10-CM | POA: Diagnosis not present

## 2024-03-25 DIAGNOSIS — I130 Hypertensive heart and chronic kidney disease with heart failure and stage 1 through stage 4 chronic kidney disease, or unspecified chronic kidney disease: Secondary | ICD-10-CM | POA: Diagnosis not present

## 2024-03-25 DIAGNOSIS — M9701XD Periprosthetic fracture around internal prosthetic right hip joint, subsequent encounter: Secondary | ICD-10-CM | POA: Diagnosis not present

## 2024-03-25 DIAGNOSIS — N183 Chronic kidney disease, stage 3 unspecified: Secondary | ICD-10-CM | POA: Diagnosis not present

## 2024-03-25 DIAGNOSIS — S72011D Unspecified intracapsular fracture of right femur, subsequent encounter for closed fracture with routine healing: Secondary | ICD-10-CM | POA: Diagnosis not present

## 2024-03-26 DIAGNOSIS — I130 Hypertensive heart and chronic kidney disease with heart failure and stage 1 through stage 4 chronic kidney disease, or unspecified chronic kidney disease: Secondary | ICD-10-CM | POA: Diagnosis not present

## 2024-03-26 DIAGNOSIS — M9701XD Periprosthetic fracture around internal prosthetic right hip joint, subsequent encounter: Secondary | ICD-10-CM | POA: Diagnosis not present

## 2024-03-26 DIAGNOSIS — N183 Chronic kidney disease, stage 3 unspecified: Secondary | ICD-10-CM | POA: Diagnosis not present

## 2024-03-26 DIAGNOSIS — S72011D Unspecified intracapsular fracture of right femur, subsequent encounter for closed fracture with routine healing: Secondary | ICD-10-CM | POA: Diagnosis not present

## 2024-03-26 DIAGNOSIS — E1122 Type 2 diabetes mellitus with diabetic chronic kidney disease: Secondary | ICD-10-CM | POA: Diagnosis not present

## 2024-03-26 DIAGNOSIS — I5032 Chronic diastolic (congestive) heart failure: Secondary | ICD-10-CM | POA: Diagnosis not present

## 2024-03-29 DIAGNOSIS — L600 Ingrowing nail: Secondary | ICD-10-CM | POA: Diagnosis not present

## 2024-03-30 DIAGNOSIS — S72011D Unspecified intracapsular fracture of right femur, subsequent encounter for closed fracture with routine healing: Secondary | ICD-10-CM | POA: Diagnosis not present

## 2024-03-30 DIAGNOSIS — I130 Hypertensive heart and chronic kidney disease with heart failure and stage 1 through stage 4 chronic kidney disease, or unspecified chronic kidney disease: Secondary | ICD-10-CM | POA: Diagnosis not present

## 2024-03-30 DIAGNOSIS — I5032 Chronic diastolic (congestive) heart failure: Secondary | ICD-10-CM | POA: Diagnosis not present

## 2024-03-30 DIAGNOSIS — M9701XD Periprosthetic fracture around internal prosthetic right hip joint, subsequent encounter: Secondary | ICD-10-CM | POA: Diagnosis not present

## 2024-03-30 DIAGNOSIS — N183 Chronic kidney disease, stage 3 unspecified: Secondary | ICD-10-CM | POA: Diagnosis not present

## 2024-03-30 DIAGNOSIS — E1122 Type 2 diabetes mellitus with diabetic chronic kidney disease: Secondary | ICD-10-CM | POA: Diagnosis not present

## 2024-04-01 DIAGNOSIS — M9701XD Periprosthetic fracture around internal prosthetic right hip joint, subsequent encounter: Secondary | ICD-10-CM | POA: Diagnosis not present

## 2024-04-01 DIAGNOSIS — N183 Chronic kidney disease, stage 3 unspecified: Secondary | ICD-10-CM | POA: Diagnosis not present

## 2024-04-01 DIAGNOSIS — E1122 Type 2 diabetes mellitus with diabetic chronic kidney disease: Secondary | ICD-10-CM | POA: Diagnosis not present

## 2024-04-01 DIAGNOSIS — I5032 Chronic diastolic (congestive) heart failure: Secondary | ICD-10-CM | POA: Diagnosis not present

## 2024-04-01 DIAGNOSIS — S72011D Unspecified intracapsular fracture of right femur, subsequent encounter for closed fracture with routine healing: Secondary | ICD-10-CM | POA: Diagnosis not present

## 2024-04-01 DIAGNOSIS — I130 Hypertensive heart and chronic kidney disease with heart failure and stage 1 through stage 4 chronic kidney disease, or unspecified chronic kidney disease: Secondary | ICD-10-CM | POA: Diagnosis not present

## 2024-04-02 DIAGNOSIS — Z96652 Presence of left artificial knee joint: Secondary | ICD-10-CM | POA: Diagnosis not present

## 2024-04-02 DIAGNOSIS — M9701XD Periprosthetic fracture around internal prosthetic right hip joint, subsequent encounter: Secondary | ICD-10-CM | POA: Diagnosis not present

## 2024-04-02 DIAGNOSIS — I5032 Chronic diastolic (congestive) heart failure: Secondary | ICD-10-CM | POA: Diagnosis not present

## 2024-04-02 DIAGNOSIS — E1122 Type 2 diabetes mellitus with diabetic chronic kidney disease: Secondary | ICD-10-CM | POA: Diagnosis not present

## 2024-04-02 DIAGNOSIS — S72011D Unspecified intracapsular fracture of right femur, subsequent encounter for closed fracture with routine healing: Secondary | ICD-10-CM | POA: Diagnosis not present

## 2024-04-02 DIAGNOSIS — M1711 Unilateral primary osteoarthritis, right knee: Secondary | ICD-10-CM | POA: Diagnosis not present

## 2024-04-02 DIAGNOSIS — I130 Hypertensive heart and chronic kidney disease with heart failure and stage 1 through stage 4 chronic kidney disease, or unspecified chronic kidney disease: Secondary | ICD-10-CM | POA: Diagnosis not present

## 2024-04-02 DIAGNOSIS — N183 Chronic kidney disease, stage 3 unspecified: Secondary | ICD-10-CM | POA: Diagnosis not present

## 2024-04-05 DIAGNOSIS — M9701XD Periprosthetic fracture around internal prosthetic right hip joint, subsequent encounter: Secondary | ICD-10-CM | POA: Diagnosis not present

## 2024-04-05 DIAGNOSIS — S72011D Unspecified intracapsular fracture of right femur, subsequent encounter for closed fracture with routine healing: Secondary | ICD-10-CM | POA: Diagnosis not present

## 2024-04-05 DIAGNOSIS — I5032 Chronic diastolic (congestive) heart failure: Secondary | ICD-10-CM | POA: Diagnosis not present

## 2024-04-05 DIAGNOSIS — N183 Chronic kidney disease, stage 3 unspecified: Secondary | ICD-10-CM | POA: Diagnosis not present

## 2024-04-05 DIAGNOSIS — I130 Hypertensive heart and chronic kidney disease with heart failure and stage 1 through stage 4 chronic kidney disease, or unspecified chronic kidney disease: Secondary | ICD-10-CM | POA: Diagnosis not present

## 2024-04-05 DIAGNOSIS — E1122 Type 2 diabetes mellitus with diabetic chronic kidney disease: Secondary | ICD-10-CM | POA: Diagnosis not present

## 2024-04-06 DIAGNOSIS — L409 Psoriasis, unspecified: Secondary | ICD-10-CM | POA: Diagnosis not present

## 2024-04-06 DIAGNOSIS — M1991 Primary osteoarthritis, unspecified site: Secondary | ICD-10-CM | POA: Diagnosis not present

## 2024-04-06 DIAGNOSIS — Z6835 Body mass index (BMI) 35.0-35.9, adult: Secondary | ICD-10-CM | POA: Diagnosis not present

## 2024-04-06 DIAGNOSIS — L4059 Other psoriatic arthropathy: Secondary | ICD-10-CM | POA: Diagnosis not present

## 2024-04-06 DIAGNOSIS — Z79899 Other long term (current) drug therapy: Secondary | ICD-10-CM | POA: Diagnosis not present

## 2024-04-06 DIAGNOSIS — E669 Obesity, unspecified: Secondary | ICD-10-CM | POA: Diagnosis not present

## 2024-04-08 DIAGNOSIS — E1122 Type 2 diabetes mellitus with diabetic chronic kidney disease: Secondary | ICD-10-CM | POA: Diagnosis not present

## 2024-04-08 DIAGNOSIS — S72011D Unspecified intracapsular fracture of right femur, subsequent encounter for closed fracture with routine healing: Secondary | ICD-10-CM | POA: Diagnosis not present

## 2024-04-08 DIAGNOSIS — I130 Hypertensive heart and chronic kidney disease with heart failure and stage 1 through stage 4 chronic kidney disease, or unspecified chronic kidney disease: Secondary | ICD-10-CM | POA: Diagnosis not present

## 2024-04-08 DIAGNOSIS — N183 Chronic kidney disease, stage 3 unspecified: Secondary | ICD-10-CM | POA: Diagnosis not present

## 2024-04-08 DIAGNOSIS — I5032 Chronic diastolic (congestive) heart failure: Secondary | ICD-10-CM | POA: Diagnosis not present

## 2024-04-08 DIAGNOSIS — M9701XD Periprosthetic fracture around internal prosthetic right hip joint, subsequent encounter: Secondary | ICD-10-CM | POA: Diagnosis not present

## 2024-04-09 DIAGNOSIS — M25561 Pain in right knee: Secondary | ICD-10-CM | POA: Diagnosis not present

## 2024-04-09 DIAGNOSIS — M1711 Unilateral primary osteoarthritis, right knee: Secondary | ICD-10-CM | POA: Diagnosis not present

## 2024-04-14 ENCOUNTER — Ambulatory Visit: Payer: Medicare Other | Admitting: Neurology

## 2024-04-14 DIAGNOSIS — I5032 Chronic diastolic (congestive) heart failure: Secondary | ICD-10-CM | POA: Diagnosis not present

## 2024-04-14 DIAGNOSIS — S72011D Unspecified intracapsular fracture of right femur, subsequent encounter for closed fracture with routine healing: Secondary | ICD-10-CM | POA: Diagnosis not present

## 2024-04-14 DIAGNOSIS — M9701XD Periprosthetic fracture around internal prosthetic right hip joint, subsequent encounter: Secondary | ICD-10-CM | POA: Diagnosis not present

## 2024-04-14 DIAGNOSIS — E1122 Type 2 diabetes mellitus with diabetic chronic kidney disease: Secondary | ICD-10-CM | POA: Diagnosis not present

## 2024-04-14 DIAGNOSIS — N183 Chronic kidney disease, stage 3 unspecified: Secondary | ICD-10-CM | POA: Diagnosis not present

## 2024-04-14 DIAGNOSIS — I130 Hypertensive heart and chronic kidney disease with heart failure and stage 1 through stage 4 chronic kidney disease, or unspecified chronic kidney disease: Secondary | ICD-10-CM | POA: Diagnosis not present

## 2024-04-15 DIAGNOSIS — N183 Chronic kidney disease, stage 3 unspecified: Secondary | ICD-10-CM | POA: Diagnosis not present

## 2024-04-15 DIAGNOSIS — S72011D Unspecified intracapsular fracture of right femur, subsequent encounter for closed fracture with routine healing: Secondary | ICD-10-CM | POA: Diagnosis not present

## 2024-04-15 DIAGNOSIS — M1711 Unilateral primary osteoarthritis, right knee: Secondary | ICD-10-CM | POA: Diagnosis not present

## 2024-04-15 DIAGNOSIS — I5032 Chronic diastolic (congestive) heart failure: Secondary | ICD-10-CM | POA: Diagnosis not present

## 2024-04-15 DIAGNOSIS — M25561 Pain in right knee: Secondary | ICD-10-CM | POA: Diagnosis not present

## 2024-04-15 DIAGNOSIS — I130 Hypertensive heart and chronic kidney disease with heart failure and stage 1 through stage 4 chronic kidney disease, or unspecified chronic kidney disease: Secondary | ICD-10-CM | POA: Diagnosis not present

## 2024-04-15 DIAGNOSIS — E1122 Type 2 diabetes mellitus with diabetic chronic kidney disease: Secondary | ICD-10-CM | POA: Diagnosis not present

## 2024-04-15 DIAGNOSIS — M9701XD Periprosthetic fracture around internal prosthetic right hip joint, subsequent encounter: Secondary | ICD-10-CM | POA: Diagnosis not present

## 2024-04-19 DIAGNOSIS — I130 Hypertensive heart and chronic kidney disease with heart failure and stage 1 through stage 4 chronic kidney disease, or unspecified chronic kidney disease: Secondary | ICD-10-CM | POA: Diagnosis not present

## 2024-04-19 DIAGNOSIS — M9701XD Periprosthetic fracture around internal prosthetic right hip joint, subsequent encounter: Secondary | ICD-10-CM | POA: Diagnosis not present

## 2024-04-19 DIAGNOSIS — N183 Chronic kidney disease, stage 3 unspecified: Secondary | ICD-10-CM | POA: Diagnosis not present

## 2024-04-19 DIAGNOSIS — E1122 Type 2 diabetes mellitus with diabetic chronic kidney disease: Secondary | ICD-10-CM | POA: Diagnosis not present

## 2024-04-19 DIAGNOSIS — S72011D Unspecified intracapsular fracture of right femur, subsequent encounter for closed fracture with routine healing: Secondary | ICD-10-CM | POA: Diagnosis not present

## 2024-04-19 DIAGNOSIS — I5032 Chronic diastolic (congestive) heart failure: Secondary | ICD-10-CM | POA: Diagnosis not present

## 2024-04-21 DIAGNOSIS — S72011D Unspecified intracapsular fracture of right femur, subsequent encounter for closed fracture with routine healing: Secondary | ICD-10-CM | POA: Diagnosis not present

## 2024-04-21 DIAGNOSIS — Z7901 Long term (current) use of anticoagulants: Secondary | ICD-10-CM | POA: Diagnosis not present

## 2024-04-21 DIAGNOSIS — I4892 Unspecified atrial flutter: Secondary | ICD-10-CM | POA: Diagnosis not present

## 2024-04-21 DIAGNOSIS — M9701XD Periprosthetic fracture around internal prosthetic right hip joint, subsequent encounter: Secondary | ICD-10-CM | POA: Diagnosis not present

## 2024-04-21 DIAGNOSIS — Z96641 Presence of right artificial hip joint: Secondary | ICD-10-CM | POA: Diagnosis not present

## 2024-04-21 DIAGNOSIS — Z794 Long term (current) use of insulin: Secondary | ICD-10-CM | POA: Diagnosis not present

## 2024-04-21 DIAGNOSIS — E1122 Type 2 diabetes mellitus with diabetic chronic kidney disease: Secondary | ICD-10-CM | POA: Diagnosis not present

## 2024-04-21 DIAGNOSIS — Z6836 Body mass index (BMI) 36.0-36.9, adult: Secondary | ICD-10-CM | POA: Diagnosis not present

## 2024-04-21 DIAGNOSIS — Z951 Presence of aortocoronary bypass graft: Secondary | ICD-10-CM | POA: Diagnosis not present

## 2024-04-21 DIAGNOSIS — Z556 Problems related to health literacy: Secondary | ICD-10-CM | POA: Diagnosis not present

## 2024-04-21 DIAGNOSIS — I2510 Atherosclerotic heart disease of native coronary artery without angina pectoris: Secondary | ICD-10-CM | POA: Diagnosis not present

## 2024-04-21 DIAGNOSIS — E785 Hyperlipidemia, unspecified: Secondary | ICD-10-CM | POA: Diagnosis not present

## 2024-04-21 DIAGNOSIS — I480 Paroxysmal atrial fibrillation: Secondary | ICD-10-CM | POA: Diagnosis not present

## 2024-04-21 DIAGNOSIS — L4050 Arthropathic psoriasis, unspecified: Secondary | ICD-10-CM | POA: Diagnosis not present

## 2024-04-21 DIAGNOSIS — I5032 Chronic diastolic (congestive) heart failure: Secondary | ICD-10-CM | POA: Diagnosis not present

## 2024-04-21 DIAGNOSIS — M069 Rheumatoid arthritis, unspecified: Secondary | ICD-10-CM | POA: Diagnosis not present

## 2024-04-21 DIAGNOSIS — D631 Anemia in chronic kidney disease: Secondary | ICD-10-CM | POA: Diagnosis not present

## 2024-04-21 DIAGNOSIS — N183 Chronic kidney disease, stage 3 unspecified: Secondary | ICD-10-CM | POA: Diagnosis not present

## 2024-04-21 DIAGNOSIS — I130 Hypertensive heart and chronic kidney disease with heart failure and stage 1 through stage 4 chronic kidney disease, or unspecified chronic kidney disease: Secondary | ICD-10-CM | POA: Diagnosis not present

## 2024-04-21 DIAGNOSIS — Z9181 History of falling: Secondary | ICD-10-CM | POA: Diagnosis not present

## 2024-04-27 DIAGNOSIS — I2510 Atherosclerotic heart disease of native coronary artery without angina pectoris: Secondary | ICD-10-CM | POA: Diagnosis not present

## 2024-04-27 DIAGNOSIS — Z794 Long term (current) use of insulin: Secondary | ICD-10-CM | POA: Diagnosis not present

## 2024-04-27 DIAGNOSIS — E1151 Type 2 diabetes mellitus with diabetic peripheral angiopathy without gangrene: Secondary | ICD-10-CM | POA: Diagnosis not present

## 2024-04-29 DIAGNOSIS — M9701XD Periprosthetic fracture around internal prosthetic right hip joint, subsequent encounter: Secondary | ICD-10-CM | POA: Diagnosis not present

## 2024-04-29 DIAGNOSIS — N183 Chronic kidney disease, stage 3 unspecified: Secondary | ICD-10-CM | POA: Diagnosis not present

## 2024-04-29 DIAGNOSIS — S72011D Unspecified intracapsular fracture of right femur, subsequent encounter for closed fracture with routine healing: Secondary | ICD-10-CM | POA: Diagnosis not present

## 2024-04-29 DIAGNOSIS — E1122 Type 2 diabetes mellitus with diabetic chronic kidney disease: Secondary | ICD-10-CM | POA: Diagnosis not present

## 2024-04-29 DIAGNOSIS — I130 Hypertensive heart and chronic kidney disease with heart failure and stage 1 through stage 4 chronic kidney disease, or unspecified chronic kidney disease: Secondary | ICD-10-CM | POA: Diagnosis not present

## 2024-04-29 DIAGNOSIS — I5032 Chronic diastolic (congestive) heart failure: Secondary | ICD-10-CM | POA: Diagnosis not present

## 2024-04-30 DIAGNOSIS — S72011D Unspecified intracapsular fracture of right femur, subsequent encounter for closed fracture with routine healing: Secondary | ICD-10-CM | POA: Diagnosis not present

## 2024-04-30 DIAGNOSIS — I5032 Chronic diastolic (congestive) heart failure: Secondary | ICD-10-CM | POA: Diagnosis not present

## 2024-04-30 DIAGNOSIS — I130 Hypertensive heart and chronic kidney disease with heart failure and stage 1 through stage 4 chronic kidney disease, or unspecified chronic kidney disease: Secondary | ICD-10-CM | POA: Diagnosis not present

## 2024-04-30 DIAGNOSIS — M9701XD Periprosthetic fracture around internal prosthetic right hip joint, subsequent encounter: Secondary | ICD-10-CM | POA: Diagnosis not present

## 2024-04-30 DIAGNOSIS — N183 Chronic kidney disease, stage 3 unspecified: Secondary | ICD-10-CM | POA: Diagnosis not present

## 2024-04-30 DIAGNOSIS — E1122 Type 2 diabetes mellitus with diabetic chronic kidney disease: Secondary | ICD-10-CM | POA: Diagnosis not present

## 2024-05-06 DIAGNOSIS — N183 Chronic kidney disease, stage 3 unspecified: Secondary | ICD-10-CM | POA: Diagnosis not present

## 2024-05-06 DIAGNOSIS — I130 Hypertensive heart and chronic kidney disease with heart failure and stage 1 through stage 4 chronic kidney disease, or unspecified chronic kidney disease: Secondary | ICD-10-CM | POA: Diagnosis not present

## 2024-05-06 DIAGNOSIS — M9701XD Periprosthetic fracture around internal prosthetic right hip joint, subsequent encounter: Secondary | ICD-10-CM | POA: Diagnosis not present

## 2024-05-06 DIAGNOSIS — S72011D Unspecified intracapsular fracture of right femur, subsequent encounter for closed fracture with routine healing: Secondary | ICD-10-CM | POA: Diagnosis not present

## 2024-05-06 DIAGNOSIS — E1122 Type 2 diabetes mellitus with diabetic chronic kidney disease: Secondary | ICD-10-CM | POA: Diagnosis not present

## 2024-05-06 DIAGNOSIS — I5032 Chronic diastolic (congestive) heart failure: Secondary | ICD-10-CM | POA: Diagnosis not present

## 2024-05-07 DIAGNOSIS — I5032 Chronic diastolic (congestive) heart failure: Secondary | ICD-10-CM | POA: Diagnosis not present

## 2024-05-07 DIAGNOSIS — N183 Chronic kidney disease, stage 3 unspecified: Secondary | ICD-10-CM | POA: Diagnosis not present

## 2024-05-07 DIAGNOSIS — E1122 Type 2 diabetes mellitus with diabetic chronic kidney disease: Secondary | ICD-10-CM | POA: Diagnosis not present

## 2024-05-07 DIAGNOSIS — M9701XD Periprosthetic fracture around internal prosthetic right hip joint, subsequent encounter: Secondary | ICD-10-CM | POA: Diagnosis not present

## 2024-05-07 DIAGNOSIS — I130 Hypertensive heart and chronic kidney disease with heart failure and stage 1 through stage 4 chronic kidney disease, or unspecified chronic kidney disease: Secondary | ICD-10-CM | POA: Diagnosis not present

## 2024-05-07 DIAGNOSIS — S72011D Unspecified intracapsular fracture of right femur, subsequent encounter for closed fracture with routine healing: Secondary | ICD-10-CM | POA: Diagnosis not present

## 2024-05-10 DIAGNOSIS — H903 Sensorineural hearing loss, bilateral: Secondary | ICD-10-CM | POA: Diagnosis not present

## 2024-05-12 DIAGNOSIS — N183 Chronic kidney disease, stage 3 unspecified: Secondary | ICD-10-CM | POA: Diagnosis not present

## 2024-05-12 DIAGNOSIS — I130 Hypertensive heart and chronic kidney disease with heart failure and stage 1 through stage 4 chronic kidney disease, or unspecified chronic kidney disease: Secondary | ICD-10-CM | POA: Diagnosis not present

## 2024-05-12 DIAGNOSIS — S72011D Unspecified intracapsular fracture of right femur, subsequent encounter for closed fracture with routine healing: Secondary | ICD-10-CM | POA: Diagnosis not present

## 2024-05-12 DIAGNOSIS — M9701XD Periprosthetic fracture around internal prosthetic right hip joint, subsequent encounter: Secondary | ICD-10-CM | POA: Diagnosis not present

## 2024-05-12 DIAGNOSIS — E1122 Type 2 diabetes mellitus with diabetic chronic kidney disease: Secondary | ICD-10-CM | POA: Diagnosis not present

## 2024-05-12 DIAGNOSIS — I5032 Chronic diastolic (congestive) heart failure: Secondary | ICD-10-CM | POA: Diagnosis not present

## 2024-05-13 DIAGNOSIS — E1122 Type 2 diabetes mellitus with diabetic chronic kidney disease: Secondary | ICD-10-CM | POA: Diagnosis not present

## 2024-05-13 DIAGNOSIS — I130 Hypertensive heart and chronic kidney disease with heart failure and stage 1 through stage 4 chronic kidney disease, or unspecified chronic kidney disease: Secondary | ICD-10-CM | POA: Diagnosis not present

## 2024-05-13 DIAGNOSIS — M9701XD Periprosthetic fracture around internal prosthetic right hip joint, subsequent encounter: Secondary | ICD-10-CM | POA: Diagnosis not present

## 2024-05-13 DIAGNOSIS — N183 Chronic kidney disease, stage 3 unspecified: Secondary | ICD-10-CM | POA: Diagnosis not present

## 2024-05-13 DIAGNOSIS — I5032 Chronic diastolic (congestive) heart failure: Secondary | ICD-10-CM | POA: Diagnosis not present

## 2024-05-13 DIAGNOSIS — S72011D Unspecified intracapsular fracture of right femur, subsequent encounter for closed fracture with routine healing: Secondary | ICD-10-CM | POA: Diagnosis not present

## 2024-05-18 DIAGNOSIS — N183 Chronic kidney disease, stage 3 unspecified: Secondary | ICD-10-CM | POA: Diagnosis not present

## 2024-05-18 DIAGNOSIS — E1122 Type 2 diabetes mellitus with diabetic chronic kidney disease: Secondary | ICD-10-CM | POA: Diagnosis not present

## 2024-05-18 DIAGNOSIS — M9701XD Periprosthetic fracture around internal prosthetic right hip joint, subsequent encounter: Secondary | ICD-10-CM | POA: Diagnosis not present

## 2024-05-18 DIAGNOSIS — I5032 Chronic diastolic (congestive) heart failure: Secondary | ICD-10-CM | POA: Diagnosis not present

## 2024-05-18 DIAGNOSIS — I130 Hypertensive heart and chronic kidney disease with heart failure and stage 1 through stage 4 chronic kidney disease, or unspecified chronic kidney disease: Secondary | ICD-10-CM | POA: Diagnosis not present

## 2024-05-18 DIAGNOSIS — S72011D Unspecified intracapsular fracture of right femur, subsequent encounter for closed fracture with routine healing: Secondary | ICD-10-CM | POA: Diagnosis not present

## 2024-05-21 DIAGNOSIS — Z556 Problems related to health literacy: Secondary | ICD-10-CM | POA: Diagnosis not present

## 2024-05-21 DIAGNOSIS — L4050 Arthropathic psoriasis, unspecified: Secondary | ICD-10-CM | POA: Diagnosis not present

## 2024-05-21 DIAGNOSIS — S72011D Unspecified intracapsular fracture of right femur, subsequent encounter for closed fracture with routine healing: Secondary | ICD-10-CM | POA: Diagnosis not present

## 2024-05-21 DIAGNOSIS — E1122 Type 2 diabetes mellitus with diabetic chronic kidney disease: Secondary | ICD-10-CM | POA: Diagnosis not present

## 2024-05-21 DIAGNOSIS — E785 Hyperlipidemia, unspecified: Secondary | ICD-10-CM | POA: Diagnosis not present

## 2024-05-21 DIAGNOSIS — I480 Paroxysmal atrial fibrillation: Secondary | ICD-10-CM | POA: Diagnosis not present

## 2024-05-21 DIAGNOSIS — N183 Chronic kidney disease, stage 3 unspecified: Secondary | ICD-10-CM | POA: Diagnosis not present

## 2024-05-21 DIAGNOSIS — I4892 Unspecified atrial flutter: Secondary | ICD-10-CM | POA: Diagnosis not present

## 2024-05-21 DIAGNOSIS — Z7901 Long term (current) use of anticoagulants: Secondary | ICD-10-CM | POA: Diagnosis not present

## 2024-05-21 DIAGNOSIS — I130 Hypertensive heart and chronic kidney disease with heart failure and stage 1 through stage 4 chronic kidney disease, or unspecified chronic kidney disease: Secondary | ICD-10-CM | POA: Diagnosis not present

## 2024-05-21 DIAGNOSIS — Z9181 History of falling: Secondary | ICD-10-CM | POA: Diagnosis not present

## 2024-05-21 DIAGNOSIS — Z794 Long term (current) use of insulin: Secondary | ICD-10-CM | POA: Diagnosis not present

## 2024-05-21 DIAGNOSIS — Z6836 Body mass index (BMI) 36.0-36.9, adult: Secondary | ICD-10-CM | POA: Diagnosis not present

## 2024-05-21 DIAGNOSIS — Z96641 Presence of right artificial hip joint: Secondary | ICD-10-CM | POA: Diagnosis not present

## 2024-05-21 DIAGNOSIS — D631 Anemia in chronic kidney disease: Secondary | ICD-10-CM | POA: Diagnosis not present

## 2024-05-21 DIAGNOSIS — I5032 Chronic diastolic (congestive) heart failure: Secondary | ICD-10-CM | POA: Diagnosis not present

## 2024-05-21 DIAGNOSIS — M9701XD Periprosthetic fracture around internal prosthetic right hip joint, subsequent encounter: Secondary | ICD-10-CM | POA: Diagnosis not present

## 2024-05-21 DIAGNOSIS — I2510 Atherosclerotic heart disease of native coronary artery without angina pectoris: Secondary | ICD-10-CM | POA: Diagnosis not present

## 2024-05-21 DIAGNOSIS — M069 Rheumatoid arthritis, unspecified: Secondary | ICD-10-CM | POA: Diagnosis not present

## 2024-05-21 DIAGNOSIS — Z951 Presence of aortocoronary bypass graft: Secondary | ICD-10-CM | POA: Diagnosis not present

## 2024-06-02 DIAGNOSIS — E1122 Type 2 diabetes mellitus with diabetic chronic kidney disease: Secondary | ICD-10-CM | POA: Diagnosis not present

## 2024-06-02 DIAGNOSIS — M9701XD Periprosthetic fracture around internal prosthetic right hip joint, subsequent encounter: Secondary | ICD-10-CM | POA: Diagnosis not present

## 2024-06-02 DIAGNOSIS — I130 Hypertensive heart and chronic kidney disease with heart failure and stage 1 through stage 4 chronic kidney disease, or unspecified chronic kidney disease: Secondary | ICD-10-CM | POA: Diagnosis not present

## 2024-06-02 DIAGNOSIS — I5032 Chronic diastolic (congestive) heart failure: Secondary | ICD-10-CM | POA: Diagnosis not present

## 2024-06-02 DIAGNOSIS — N183 Chronic kidney disease, stage 3 unspecified: Secondary | ICD-10-CM | POA: Diagnosis not present

## 2024-06-02 DIAGNOSIS — S72011D Unspecified intracapsular fracture of right femur, subsequent encounter for closed fracture with routine healing: Secondary | ICD-10-CM | POA: Diagnosis not present

## 2024-06-09 DIAGNOSIS — S72011D Unspecified intracapsular fracture of right femur, subsequent encounter for closed fracture with routine healing: Secondary | ICD-10-CM | POA: Diagnosis not present

## 2024-06-09 DIAGNOSIS — M9701XD Periprosthetic fracture around internal prosthetic right hip joint, subsequent encounter: Secondary | ICD-10-CM | POA: Diagnosis not present

## 2024-06-09 DIAGNOSIS — E1122 Type 2 diabetes mellitus with diabetic chronic kidney disease: Secondary | ICD-10-CM | POA: Diagnosis not present

## 2024-06-09 DIAGNOSIS — I5032 Chronic diastolic (congestive) heart failure: Secondary | ICD-10-CM | POA: Diagnosis not present

## 2024-06-09 DIAGNOSIS — I130 Hypertensive heart and chronic kidney disease with heart failure and stage 1 through stage 4 chronic kidney disease, or unspecified chronic kidney disease: Secondary | ICD-10-CM | POA: Diagnosis not present

## 2024-06-09 DIAGNOSIS — N183 Chronic kidney disease, stage 3 unspecified: Secondary | ICD-10-CM | POA: Diagnosis not present

## 2024-06-17 DIAGNOSIS — S72011D Unspecified intracapsular fracture of right femur, subsequent encounter for closed fracture with routine healing: Secondary | ICD-10-CM | POA: Diagnosis not present

## 2024-06-17 DIAGNOSIS — E1122 Type 2 diabetes mellitus with diabetic chronic kidney disease: Secondary | ICD-10-CM | POA: Diagnosis not present

## 2024-06-17 DIAGNOSIS — N183 Chronic kidney disease, stage 3 unspecified: Secondary | ICD-10-CM | POA: Diagnosis not present

## 2024-06-17 DIAGNOSIS — I5032 Chronic diastolic (congestive) heart failure: Secondary | ICD-10-CM | POA: Diagnosis not present

## 2024-06-17 DIAGNOSIS — I130 Hypertensive heart and chronic kidney disease with heart failure and stage 1 through stage 4 chronic kidney disease, or unspecified chronic kidney disease: Secondary | ICD-10-CM | POA: Diagnosis not present

## 2024-06-17 DIAGNOSIS — M9701XD Periprosthetic fracture around internal prosthetic right hip joint, subsequent encounter: Secondary | ICD-10-CM | POA: Diagnosis not present

## 2024-07-06 ENCOUNTER — Ambulatory Visit (INDEPENDENT_AMBULATORY_CARE_PROVIDER_SITE_OTHER): Admitting: Neurology

## 2024-07-06 ENCOUNTER — Encounter: Payer: Self-pay | Admitting: Neurology

## 2024-07-06 VITALS — BP 124/78 | HR 64 | Ht 61.0 in | Wt 200.2 lb

## 2024-07-06 DIAGNOSIS — G3184 Mild cognitive impairment, so stated: Secondary | ICD-10-CM

## 2024-07-06 DIAGNOSIS — R413 Other amnesia: Secondary | ICD-10-CM

## 2024-07-06 DIAGNOSIS — Z8673 Personal history of transient ischemic attack (TIA), and cerebral infarction without residual deficits: Secondary | ICD-10-CM | POA: Diagnosis not present

## 2024-07-06 NOTE — Progress Notes (Addendum)
 [facility] [address] [place]. [place] [address] [phone]       OFFICE FOLLOW UP VISIT NOTE  [name] Date of Birth:  04/30/1934 Medical Record Number:  [id number]   Referring MD: Adine Hof  Reason for Referral: Stroke  HPI: Initial visit 01/06/2023 [name] is a pleasant 89 year old Caucasian lady seen today for initial office consultation visit for stroke.  History is obtained from her and her son who is accompanying her as well as Dr. Deanne whom  spoke to me on the phone last week.  I personally reviewed imaging studies in PACS and electronic medical records.  She has past medical history of coronary artery disease s/p CABG in 2006, paroxysmal A-fib on chronic anticoagulation with Pradaxa , rheumatoid arthritis, diabetes, hypertension, prior left cerebellar stroke in 2012 without residual deficits complicated migraine.  Strokelike symptoms on 11/15/2018 with trouble saying words, confusion and slurred speech.  MRI brain negative for acute stroke.  Old left cerebellar infarct was noted on MRI remote age.  Again she had episode of vertigo nausea and head fullness On 12/28/2022 she developed sudden onset of left-sided weakness and numbness and started dropping objects from the left hand had trouble walking due to feeling of heaviness on the left side.  He normally uses a walker to walking.  Trouble holding it.  And was leaning to the left.  He refused to go to the ER to undergo any testing.  Gradually stabilized and improved somewhat over the next few days but not back to baseline.Dr [name] consulted me over the phone on the weekend we discussed treatment options.Patient was finally convinced to get a CT scan of the head which was obtained on 12/31/2022 which has been read as not showing any acute abnormalities but my interpretation shows a right basal ganglia hypodensity (on series 2 image 15/31 years as well as some corresponding coronal sequences)  compatible with an acute stroke..  Patient has been quite compliant with the Pradaxa  which she has been taking regularly and not missed any doses.  She states left-sided numbness is improved though she still feels heavy on the left.  Fine motor skills are diminished in handwriting and ability to hold any fork is improving but not back to baseline.  Her primary care physician has ordered home physical and Occupational Therapy which he is going to begin soon. she is tolerating Pradaxa  well without bleeding or bruising.  Her blood pressure is well-controlled today it is 137/70.  She had some lab work done by primary care physician but I do not have access to it and did not know if lipid profile or A1c was checked.  She has followed with Dr. Myra.  For outpatient neurology in the past Update 04/15/2023 : She returns for follow-up after last visit 3 months ago.  She is accompanied by her daughter-in-law.  Patient continues to have some left hand weakness and diminished fine motor skills.  She has finished home physical and Occupational Therapy.  She ambulates with a walker.  She has not had any falls.  She  had 2 brief dizzy episodes last month.  She describes this as a feeling of imbalance without true vertigo or accompanying nausea vomiting or blurred vision.  This is only transient.  She had to hold to avoid falling.  She has not been doing any specific hand exercises.  MRI scan of the brain on 01/27/2023 shows multiple remote lacunar infarcts in the right thalamus, left cerebellum and left middle  cerebral Beller peduncles.  The right thalamic infarct appears new compared to previous MRI from 2019 and shows faint postcontrast enhancement suggesting late subacute nature responsible for her symptoms.  MR angiogram of the brain shows no significant large vessel stenosis only mild atheromatous changes in the left cavernous ICA.  Carotid ultrasound on 01/22/2023 shows no significant stenosis.  She remains on pradaxa  which  she is tolerating well without bleeding and only minor bruising.  She has no new complaints today. Update 07/06/2024 : She returns for follow-up after last visit more than a year ago.  She is accompanied by her daughter-in-law.  Patient has not had any recurrent stroke or TIA symptoms.  She continues to have mild left hand numbness and weakness.  She often drops objects from that hand strength is not as good.  She has new complaints of decreased short-term memory.  She appears quite stressed out and overwhelmed with taking care of her husband who has quite advanced dementia and is very demanding.  She remains on Pradaxa  for stroke prevention and is tolerating it well with minor bruising and no bleeding episodes.  She is living at home with her husband and there is a 24-hour caregiver.  She has significant pain in her hip.  She had a fall and was hospitalized and found to have a small fracture of the bone adjacent to the prosthesis.  She walks with a rollator and is careful and has had no further falls.  On cognitive testing today she scored 28/30 and was able to name 8 animals which can walk on for leg.  Clock drawing was 4/4. ROS:   14 system review of systems is positive for weakness, numbness, decreased coordination gait difficulty, imbalance and all other systems negative.  PMH:  Past Medical History:  Diagnosis Date   Anginal pain (HCC)    saw doctor-think it was esophagus spasm   Arthritis    Atrial flutter (HCC) 11/02/2013   Cataract    left eye   Coronary artery disease    Diabetes mellitus without complication (HCC)    type 2   Essential hypertension, benign 11/02/2013   H/O jaundice    as child   Headache 2015   complicated migraine x2    Hearing trouble    Heart disease    Hepatitis    hx of hepatitis A as child   High cholesterol    under control   Long term (current) use of anticoagulants 11/02/2013   Pradaxa     Measles    as child   Mouth dryness    Mumps    as teenager    Numbness and tingling    lips   Osteoporosis    unsure-maybe in knees   Psoriasis    Psoriatic arthritis (HCC)    Stroke (HCC) 2012   Vertigo    being monitored, occasional    Social History:  Social History   Socioeconomic History   Marital status: Married    Spouse name: Arley   Number of children: 3   Years of education: 16   Highest education level: Bachelor's degree (e.g., BA, AB, BS)  Occupational History   Occupation: Retired  Tobacco Use   Smoking status: Never   Smokeless tobacco: Never  Vaping Use   Vaping status: Never Used  Substance and Sexual Activity   Alcohol  use: No    Alcohol /week: 0.0 standard drinks of alcohol    Drug use: No   Sexual activity: Not on file  Other Topics Concern   Not on file  Social History Narrative   Patient is married with 3 children.   Patient is left handed.   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   Patient drinks 3 cups daily.   Social Drivers of Corporate investment banker Strain: Not on file  Food Insecurity: No Food Insecurity (10/07/2023)   Hunger Vital Sign    Worried About Running Out of Food in the Last Year: Never true    Ran Out of Food in the Last Year: Never true  Transportation Needs: No Transportation Needs (10/07/2023)   PRAPARE - Administrator, Civil Service (Medical): No    Lack of Transportation (Non-Medical): No  Physical Activity: Not on file  Stress: Not on file  Social Connections: Not on file  Intimate Partner Violence: Not At Risk (10/07/2023)   Humiliation, Afraid, Rape, and Kick questionnaire    Fear of Current or Ex-Partner: No    Emotionally Abused: No    Physically Abused: No    Sexually Abused: No    Medications:   Current Outpatient Medications on File Prior to Visit  Medication Sig Dispense Refill   acetaminophen  (TYLENOL ) 325 MG tablet Take 2 tablets (650 mg total) by mouth every 6 (six) hours as needed for mild pain (pain score 1-3), fever or headache.      carvedilol  (COREG ) 6.25 MG tablet Take 1 tablet (6.25 mg total) by mouth 2 (two) times daily. 180 tablet 3   Cholecalciferol  (VITAMIN D3) 1000 units CAPS Take 1,000 Units by mouth daily.     CLARITIN  REDITABS 10 MG dissolvable tablet Take 10 mg by mouth in the morning.     clonazePAM  (KLONOPIN ) 0.5 MG tablet Take 1 tablet (0.5 mg total) by mouth daily as needed for anxiety. (Patient taking differently: Take 0.5 mg by mouth at bedtime.) 30 tablet 0   Continuous Glucose Sensor (FREESTYLE LIBRE 2 SENSOR) MISC Inject 1 Device into the skin every 14 (fourteen) days.     Cyanocobalamin  (VITAMIN B-12) 2500 MCG SUBL Place 2,500 mcg under the tongue every Monday, Wednesday, and Friday.     dabigatran  (PRADAXA ) 150 MG CAPS capsule Take 150 mg by mouth 2 (two) times daily.     docusate sodium  (COLACE) 100 MG capsule Take 100 mg by mouth at bedtime as needed for mild constipation.     ENBREL  50 MG/ML injection Inject 50 mg into the skin every Tuesday.     insulin  glargine-yfgn (SEMGLEE ) 100 UNIT/ML injection Inject 0.3 mLs (30 Units total) into the skin 2 (two) times daily.     isosorbide  mononitrate (IMDUR ) 30 MG 24 hr tablet Take 0.5 tablets (15 mg total) by mouth daily. 45 tablet 3   nitroGLYCERIN  (NITROSTAT ) 0.4 MG SL tablet Place 1 tablet (0.4 mg total) under the tongue every 5 (five) minutes x 3 doses as needed for chest pain. 25 tablet 3   polyethylene glycol (MIRALAX  / GLYCOLAX ) 17 g packet Take 17 g by mouth 2 (two) times daily.     Probiotic Product (ALIGN) 4 MG CAPS Take 4 mg by mouth daily in the afternoon.     ramipril  (ALTACE ) 5 MG capsule TAKE ONE CAPSULE BY MOUTH TWICE DAILY 180 capsule 3   rosuvastatin  (CRESTOR ) 10 MG tablet Take 10 mg by mouth at bedtime.      senna-docusate (SENOKOT-S) 8.6-50 MG tablet Take 1 tablet by mouth 2 (two) times daily between meals as needed for mild  constipation.     spironolactone  (ALDACTONE ) 25 MG tablet Take 0.5 tablets (12.5 mg total) by mouth daily. 45  tablet 3   TUMS ULTRA 1000 1000 MG chewable tablet Chew 1,000 mg by mouth 3 (three) times daily as needed for heartburn.     doxycycline (VIBRA-TABS) 100 MG tablet Take 100 mg by mouth 2 (two) times daily. (Patient not taking: Reported on 07/06/2024)     Lactulose 20 GM/30ML SOLN Take 30 mLs by mouth every 4 (four) hours. (Patient not taking: Reported on 07/06/2024)     Multiple Vitamin (MULTIVITAMIN WITH MINERALS) TABS tablet Take 1 tablet by mouth daily. (Patient not taking: Reported on 07/06/2024)     senna (SENOKOT) 8.6 MG TABS tablet Take 1 tablet (8.6 mg total) by mouth 2 (two) times daily. (Patient not taking: Reported on 07/06/2024)     traMADol  (ULTRAM ) 50 MG tablet Take 1 tablet (50 mg total) by mouth every 6 (six) hours as needed for moderate pain (pain score 4-6). (Patient not taking: Reported on 07/06/2024) 30 tablet 0   No current facility-administered medications on file prior to visit.    Allergies:   Allergies  Allergen Reactions   Codeine Nausea And Vomiting   Penicillins Swelling and Other (See Comments)    Swelling of the tongue   Acetaminophen -Codeine Other (See Comments)    Upset the stomach   Atenolol Other (See Comments)    Reaction unconfirmed- might have just be an ineffective med   Atorvastatin  Other (See Comments)    Muscle soreness   Chloramphenicol Other (See Comments)    Reaction unconfirmed   Dapagliflozin Other (See Comments)     yeast infections   Empagliflozin Other (See Comments)    Yeast infections   Liraglutide Nausea Only and Other (See Comments)    Constipation, bloating also   Metformin  Hcl Nausea Only   Metoprolol Other (See Comments)    Reaction unconfirmed   Zetia [Ezetimibe] Other (See Comments)    Reaction unconfirmed- might have just be an ineffective med   Sertraline Palpitations    Physical Exam General: well developed, well nourished pleasant elderly Caucasian lady, seated, in no evident distress Head: head normocephalic and atraumatic.    Neck: supple with no carotid or supraclavicular bruits Cardiovascular: regular rate and rhythm, no murmurs Musculoskeletal: no deformity Skin:  no rash/petichiae Vascular:  Normal pulses all extremities  Neurologic Exam Mental Status: Awake and fully alert. Oriented to place and time. Recent and remote memory intact. Attention span, concentration and fund of knowledge appropriate. Mood and affect appropriate.   Cognitive testing showed Mini-Mental status exam score 28/30.  Able to name 8 animals which can walk on forelegs.  Clock drawing 4/4.   Cranial Nerves: Fundoscopic exam reveals sharp disc margins. Pupils equal, briskly reactive to light. Extraocular movements full without nystagmus. Visual fields full to confrontation. Hearing intact. Facial sensation intact. Face, tongue, palate moves normally and symmetrically.  Motor: Normal bulk and tone. Normal strength in all tested extremity muscles except slight diminished fine motor skills in the left hand.  Orbits right over left upper extremity.  Slightly increased tone in the left upper and lower extremity..  Mild weakness of left grip. Sensory.: intact to touch , pinprick , position and vibratory sensation.  Coordination: Rapid alternating movements normal in all extremities. Finger-to-nose and heel-to-shin performed accurately bilaterally. Gait and Station: Arises from chair with slight difficulty. Stance is stooped.  Uses a walker and walks with cautious gait with slight dragging of  the left leg.  Slightly unsteady while standing on left foot unsupported Reflexes: 1+ and symmetric. Toes downgoing.   NIHSS  0 Modified Rankin  3    07/06/2024    2:01 PM  MMSE - Mini Mental State Exam  Orientation to time 4  Orientation to Place 4  Registration 3  Attention/ Calculation 5  Recall 3  Language- name 2 objects 2  Language- repeat 1  Language- follow 3 step command 3  Language- read & follow direction 1  Write a sentence 1  Copy design  1  Total score 28     ASSESSMENT: 89 year old Caucasian lady with left hemiparesis and numbness in December 2023 due to right subcortical thalamic infarct likely  from small vessel disease.  She has chronic atrial fibrillation and is on long-term anticoagulation with Pradaxa  and has past history of left cerebellar stroke in 2012 and possible TIA in 2019.  Vascular risk factors of diabetes, hypertension, hyperlipidemia , atrial fibrillation, mild obesity.New complaints of memory loss due to mild cognitive impairment     PLAN:I had a long d/w patient and her daughter in law about her remote stroke, memory loss,chronic atrial fibrillation,risk for recurrent stroke/TIAs, personally independently reviewed imaging studies and stroke evaluation results and answered questions.Continue Pradaxa  (dabigatran ) twice a day  for secondary stroke prevention there is no definite evidence that switching to alternative anticoagulant regimen necessarily superior.  And maintain strict control of hypertension with blood pressure goal below 130/90, diabetes with hemoglobin A1c goal below 6.5% and lipids with LDL cholesterol goal below 70 mg/dL. I also advised the patient to eat a healthy diet with plenty of whole grains, cereals, fruits and vegetables, exercise regularly and maintain ideal body weight .  She was advised to use a walker at all times and we discussed fall safety precautions.  I advised her to increase participation in cognitively challenging activities like solving crossword puzzles, playing bridge and sudoku.  We also discussed memory compensation strategies.  Followup in the future with me in 1 year or call earlier if necessary.  Greater than 50% time during this 35-minute was spent  on counseling and coordination of care about her lacunar stroke and discussion about evaluation and. Treatment.  Eather Popp, MD Note: This document was prepared with digital dictation and possible smart phrase technology. Any  transcriptional errors that result from this process are unintentional.

## 2024-07-06 NOTE — Patient Instructions (Addendum)
 I had a long d/w patient and her daughter in law about her remote stroke, memory loss,chronic atrial fibrillation,risk for recurrent stroke/TIAs, personally independently reviewed imaging studies and stroke evaluation results and answered questions.Continue Pradaxa  (dabigatran ) twice a day  for secondary stroke prevention there is no definite evidence that switching to alternative anticoagulant regimen necessarily superior.  And maintain strict control of hypertension with blood pressure goal below 130/90, diabetes with hemoglobin A1c goal below 6.5% and lipids with LDL cholesterol goal below 70 mg/dL. I also advised the patient to eat a healthy diet with plenty of whole grains, cereals, fruits and vegetables, exercise regularly and maintain ideal body weight .  She was advised to use a walker at all times and we discussed fall safety precautions.  I advised her to increase participation in cognitively challenging activities like solving crossword puzzles, playing bridge and sudoku.  We also discussed memory compensation strategies.  Followup in the future with me in 1 year or call earlier if necessary.   Memory Compensation Strategies  Use WARM strategy.  W= write it down  A= associate it  R= repeat it  M= make a mental note  2.   You can keep a Glass blower/designer.  Use a 3-ring notebook with sections for the following: calendar, important names and phone numbers,  medications, doctors' names/phone numbers, lists/reminders, and a section to journal what you did  each day.   3.    Use a calendar to write appointments down.  4.    Write yourself a schedule for the day.  This can be placed on the calendar or in a separate section of the Memory Notebook.  Keeping a  regular schedule can help memory.  5.    Use medication organizer with sections for each day or morning/evening pills.  You may need help loading it  6.    Keep a basket, or pegboard by the door.  Place items that you need to take out  with you in the basket or on the pegboard.  You may also want to  include a message board for reminders.  7.    Use sticky notes.  Place sticky notes with reminders in a place where the task is performed.  For example:  turn off the  stove placed by the stove, lock the door placed on the door at eye level,  take your medications on  the bathroom mirror or by the place where you normally take your medications.  8.    Use alarms/timers.  Use while cooking to remind yourself to check on food or as a reminder to take your medicine, or as a  reminder to make a call, or as a reminder to perform another task, etc.

## 2024-07-27 DIAGNOSIS — E1151 Type 2 diabetes mellitus with diabetic peripheral angiopathy without gangrene: Secondary | ICD-10-CM | POA: Diagnosis not present

## 2024-07-27 DIAGNOSIS — Z794 Long term (current) use of insulin: Secondary | ICD-10-CM | POA: Diagnosis not present

## 2024-07-27 DIAGNOSIS — I2510 Atherosclerotic heart disease of native coronary artery without angina pectoris: Secondary | ICD-10-CM | POA: Diagnosis not present

## 2024-08-02 ENCOUNTER — Other Ambulatory Visit (HOSPITAL_BASED_OUTPATIENT_CLINIC_OR_DEPARTMENT_OTHER): Payer: Self-pay

## 2024-08-02 MED ORDER — OZEMPIC (0.25 OR 0.5 MG/DOSE) 2 MG/1.5ML ~~LOC~~ SOPN: 0.2500 mg | PEN_INJECTOR | SUBCUTANEOUS | 12 refills | Status: AC

## 2024-08-02 MED ORDER — OZEMPIC (0.25 OR 0.5 MG/DOSE) 2 MG/3ML ~~LOC~~ SOPN
0.2500 mg | PEN_INJECTOR | SUBCUTANEOUS | 12 refills | Status: AC
  Filled 2024-08-02: qty 3, 28d supply, fill #0

## 2024-09-01 DIAGNOSIS — Z961 Presence of intraocular lens: Secondary | ICD-10-CM | POA: Diagnosis not present

## 2024-09-01 DIAGNOSIS — E119 Type 2 diabetes mellitus without complications: Secondary | ICD-10-CM | POA: Diagnosis not present

## 2024-09-01 DIAGNOSIS — H52203 Unspecified astigmatism, bilateral: Secondary | ICD-10-CM | POA: Diagnosis not present

## 2024-09-01 DIAGNOSIS — H35372 Puckering of macula, left eye: Secondary | ICD-10-CM | POA: Diagnosis not present

## 2024-09-17 DIAGNOSIS — R350 Frequency of micturition: Secondary | ICD-10-CM | POA: Diagnosis not present

## 2024-09-17 DIAGNOSIS — Z79899 Other long term (current) drug therapy: Secondary | ICD-10-CM | POA: Diagnosis not present

## 2024-09-17 DIAGNOSIS — E1151 Type 2 diabetes mellitus with diabetic peripheral angiopathy without gangrene: Secondary | ICD-10-CM | POA: Diagnosis not present

## 2024-09-17 DIAGNOSIS — E11319 Type 2 diabetes mellitus with unspecified diabetic retinopathy without macular edema: Secondary | ICD-10-CM | POA: Diagnosis not present

## 2024-09-22 ENCOUNTER — Ambulatory Visit (HOSPITAL_BASED_OUTPATIENT_CLINIC_OR_DEPARTMENT_OTHER): Admitting: Cardiology

## 2024-09-22 ENCOUNTER — Encounter (HOSPITAL_BASED_OUTPATIENT_CLINIC_OR_DEPARTMENT_OTHER): Payer: Self-pay | Admitting: Cardiology

## 2024-09-22 VITALS — BP 115/61 | HR 71 | Resp 19 | Ht 61.0 in | Wt 199.0 lb

## 2024-09-22 DIAGNOSIS — I480 Paroxysmal atrial fibrillation: Secondary | ICD-10-CM | POA: Diagnosis not present

## 2024-09-22 DIAGNOSIS — I25709 Atherosclerosis of coronary artery bypass graft(s), unspecified, with unspecified angina pectoris: Secondary | ICD-10-CM | POA: Diagnosis not present

## 2024-09-22 DIAGNOSIS — I10 Essential (primary) hypertension: Secondary | ICD-10-CM

## 2024-09-22 DIAGNOSIS — I5032 Chronic diastolic (congestive) heart failure: Secondary | ICD-10-CM

## 2024-09-22 DIAGNOSIS — Z7901 Long term (current) use of anticoagulants: Secondary | ICD-10-CM | POA: Diagnosis not present

## 2024-09-22 DIAGNOSIS — D6869 Other thrombophilia: Secondary | ICD-10-CM | POA: Diagnosis not present

## 2024-09-22 DIAGNOSIS — I25118 Atherosclerotic heart disease of native coronary artery with other forms of angina pectoris: Secondary | ICD-10-CM | POA: Diagnosis not present

## 2024-09-22 NOTE — Patient Instructions (Signed)
 Medication Instructions:  No changes *If you need a refill on your cardiac medications before your next appointment, please call your pharmacy*  Lab Work: none  Testing/Procedures: none  Follow-Up: At [facility], you and your health needs are our priority.  As part of our continuing mission to provide you with exceptional heart care, our providers are all part of one team.  This team includes your primary Cardiologist (physician) and Advanced Practice Providers or APPs (Physician Assistants and Nurse Practitioners) who all work together to provide you with the care you need, when you need it.  Your next appointment:   6 month(s)  Provider:   Shelda Bruckner, MD

## 2024-09-22 NOTE — Progress Notes (Signed)
 Cardiology Office Note:  .   Date:  09/22/2024  ID:  Maurilio MARLA Hof, DOB 25-Mar-1934, MRN [id number] PCP: Charlott Dorn LABOR, MD  [facility] Providers Cardiologist:  Shelda Bruckner, MD {  History of Present Illness: .   [name] is a 89 y.o. female with PMH CAD s/p CABG on 2006, chronic diastolic heart failure, paroxysmal atrial fibrillation, type II diabetes, hypertension, CVA. She was previously followed by Dr. Claudene and Dr. Hobart and established care with me on 03/24/24.  CV: Underwent cardiac PET in 07/2023, which showed small area of apical lateral ischemia (intermediate risk). Unfortunately she had a hip fracture from a fall in 09/2023; she underwent surgery without cardiovascular complications.  Today: Feels like she is generally not doing well. Hip is still very painful, especially at night. Has finished PT. Minimally active. Does note occasional mild chest discomfort, not worse than prior, nonlimiting. Still related to food/swallowing, helped by Tums. Will happen for a few days in a row, then not happen for a long time. No further syncope.   ROS: Denies shortness of breath at rest or with normal exertion. No PND, orthopnea, or unexpected weight gain. No palpitations. ROS otherwise negative except as noted.   Studies Reviewed: SABRA    EKG:  EKG Interpretation Date/Time:  Wednesday September 22 2024 11:09:37 EDT Ventricular Rate:  71 PR Interval:  238 QRS Duration:  142 QT Interval:  408 QTC Calculation: 443 R Axis:   -45  Text Interpretation: Sinus rhythm with 1st degree A-V block Left axis deviation Left bundle branch block Confirmed by Bruckner Shelda [phone]) on 09/22/2024 12:06:00 PM    Physical Exam:   VS:  BP 115/61 (BP Location: Left Arm, Patient Position: Sitting, Cuff Size: Large)   Pulse 71   Resp 19   Ht 5' 1 (1.549 m)   Wt 199 lb (90.3 kg)   SpO2 95%   BMI 37.60 kg/m    Wt Readings from Last 3 Encounters:  09/22/24 199 lb  (90.3 kg)  07/06/24 200 lb 3.2 oz (90.8 kg)  10/19/23 191 lb 2.2 oz (86.7 kg)    GEN: Well nourished, well developed in no acute distress HEENT: Normal, moist mucous membranes NECK: No JVD CARDIAC: regular rhythm, normal S1 and S2, no rubs or gallops. No murmur. VASCULAR: Radial and DP pulses 2+ bilaterally. No carotid bruits RESPIRATORY:  Clear to auscultation without rales, wheezing or rhonchi  ABDOMEN: Soft, non-tender, non-distended MUSCULOSKELETAL:  Ambulates independently SKIN: Warm and dry, trivial LE edema NEUROLOGIC:  Alert and oriented x 3. No focal neuro deficits noted. PSYCHIATRIC:  Normal affect    ASSESSMENT AND PLAN: .    CAD s/p CABG in 2006 Hypercholesterolemia Chest discomfort, likely noncardiac -chest pain is nonexertional, after eating, mild, nonlimiting. Suspect GI, but reviewed red flags that need further evaluation. -continue rosuvastatin  10 mg daily. -no aspirin as she is on dabigatran  -continue carvedilol  6.25 mg BID, imdur  15 mg daily as anti anginals -cardiac PET with small area of possible ischemia, overall intermediate risk -has not required nitroglycerin , checked bottles and reviewed expiration dates today -reviewed red flag warning signs that need immediate medical attention  Chronic diastolic heart failure -lungs clear, no JVD, suggests euvolemic -LE edema improved from prior. Recommended elevation, compression stockings -continue spironolactone  12.5 mg daily  Paroxysmal atrial fibrillation Prior CVA 2012, TIA 2019, CVA 2023, possible CVA 2024 (from imaging) -CHA2DS2/VAS Stroke Risk Points=9 -continue pradaxa , denies bleeding -on carvedilol  as above  Hypertension -on spironolactone ,  carvedilol , imdur  as above -continue ramipril  5 mg BID  Type II diabetes -on insulin   Dispo: 6 mos or sooner as needed  Signed, Shelda Bruckner, MD   Shelda Bruckner, MD, PhD, [facility]  [facility]  [place]  [facility]  at [facility] at [facility] [address] [place], [place] [address] [phone]

## 2024-10-12 DIAGNOSIS — L4059 Other psoriatic arthropathy: Secondary | ICD-10-CM | POA: Diagnosis not present

## 2024-10-12 DIAGNOSIS — M1991 Primary osteoarthritis, unspecified site: Secondary | ICD-10-CM | POA: Diagnosis not present

## 2024-10-12 DIAGNOSIS — Z79899 Other long term (current) drug therapy: Secondary | ICD-10-CM | POA: Diagnosis not present

## 2024-10-12 DIAGNOSIS — R5383 Other fatigue: Secondary | ICD-10-CM | POA: Diagnosis not present

## 2024-10-12 DIAGNOSIS — E669 Obesity, unspecified: Secondary | ICD-10-CM | POA: Diagnosis not present

## 2024-10-12 DIAGNOSIS — L409 Psoriasis, unspecified: Secondary | ICD-10-CM | POA: Diagnosis not present

## 2024-10-12 DIAGNOSIS — Z6836 Body mass index (BMI) 36.0-36.9, adult: Secondary | ICD-10-CM | POA: Diagnosis not present

## 2024-10-17 DIAGNOSIS — Z23 Encounter for immunization: Secondary | ICD-10-CM | POA: Diagnosis not present

## 2024-10-26 DIAGNOSIS — M1711 Unilateral primary osteoarthritis, right knee: Secondary | ICD-10-CM | POA: Diagnosis not present

## 2024-11-01 DIAGNOSIS — M1711 Unilateral primary osteoarthritis, right knee: Secondary | ICD-10-CM | POA: Diagnosis not present

## 2024-11-01 DIAGNOSIS — M25561 Pain in right knee: Secondary | ICD-10-CM | POA: Diagnosis not present

## 2024-11-09 DIAGNOSIS — M1711 Unilateral primary osteoarthritis, right knee: Secondary | ICD-10-CM | POA: Diagnosis not present

## 2025-02-07 ENCOUNTER — Ambulatory Visit: Admitting: Podiatry
